# Patient Record
Sex: Female | Born: 1950
Health system: Southern US, Community
[De-identification: ages and names within clinical notes are randomized; demographics above are authoritative.]

## PROBLEM LIST (undated history)

## (undated) DIAGNOSIS — Z8489 Family history of other specified conditions: Secondary | ICD-10-CM

## (undated) DIAGNOSIS — G5600 Carpal tunnel syndrome, unspecified upper limb: Secondary | ICD-10-CM

## (undated) DIAGNOSIS — E785 Hyperlipidemia, unspecified: Secondary | ICD-10-CM

## (undated) DIAGNOSIS — I10 Essential (primary) hypertension: Secondary | ICD-10-CM

## (undated) DIAGNOSIS — M8580 Other specified disorders of bone density and structure, unspecified site: Secondary | ICD-10-CM

## (undated) DIAGNOSIS — M75101 Unspecified rotator cuff tear or rupture of right shoulder, not specified as traumatic: Secondary | ICD-10-CM

## (undated) HISTORY — DX: Hypercalcemia: E83.52

## (undated) HISTORY — DX: Carpal tunnel syndrome, unspecified upper limb: G56.00

## (undated) HISTORY — DX: Unspecified rotator cuff tear or rupture of right shoulder, not specified as traumatic: M75.101

## (undated) HISTORY — DX: Hyperlipidemia, unspecified: E78.5

## (undated) HISTORY — DX: Other specified disorders of bone density and structure, unspecified site: M85.80

## (undated) HISTORY — PX: ABDOMINAL HYSTERECTOMY: SHX81

## (undated) HISTORY — DX: Essential (primary) hypertension: I10

## (undated) HISTORY — PX: BREAST CYST EXCISION: SHX579

---
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
## 1994-11-22 DIAGNOSIS — E1165 Type 2 diabetes mellitus with hyperglycemia: Secondary | ICD-10-CM

## 1994-11-22 DIAGNOSIS — E119 Type 2 diabetes mellitus without complications: Secondary | ICD-10-CM | POA: Insufficient documentation

## 1994-11-22 HISTORY — DX: Type 2 diabetes mellitus without complications: E11.9

## 1998-06-08 ENCOUNTER — Ambulatory Visit: Admission: RE | Admit: 1998-06-08 | Discharge: 1998-06-08 | Payer: Self-pay | Admitting: *Deleted

## 1999-09-20 ENCOUNTER — Ambulatory Visit (HOSPITAL_COMMUNITY): Admission: RE | Admit: 1999-09-20 | Discharge: 1999-09-20 | Payer: Self-pay | Admitting: Neurosurgery

## 1999-09-20 ENCOUNTER — Encounter: Payer: Self-pay | Admitting: Neurosurgery

## 1999-09-20 DIAGNOSIS — M8580 Other specified disorders of bone density and structure, unspecified site: Secondary | ICD-10-CM

## 1999-09-20 HISTORY — DX: Other specified disorders of bone density and structure, unspecified site: M85.80

## 1999-10-03 ENCOUNTER — Encounter: Payer: Self-pay | Admitting: Neurosurgery

## 1999-10-03 ENCOUNTER — Ambulatory Visit (HOSPITAL_COMMUNITY): Admission: RE | Admit: 1999-10-03 | Discharge: 1999-10-03 | Payer: Self-pay | Admitting: Neurosurgery

## 1999-10-14 ENCOUNTER — Encounter: Payer: Self-pay | Admitting: Neurosurgery

## 1999-10-28 ENCOUNTER — Encounter: Payer: Self-pay | Admitting: Neurosurgery

## 1999-10-28 ENCOUNTER — Ambulatory Visit (HOSPITAL_COMMUNITY): Admission: RE | Admit: 1999-10-28 | Discharge: 1999-10-28 | Payer: Self-pay | Admitting: Neurosurgery

## 2000-03-02 ENCOUNTER — Encounter: Admission: RE | Admit: 2000-03-02 | Discharge: 2000-03-02 | Payer: Self-pay | Admitting: Hematology and Oncology

## 2001-10-01 ENCOUNTER — Ambulatory Visit (HOSPITAL_COMMUNITY): Admission: RE | Admit: 2001-10-01 | Discharge: 2001-10-01 | Payer: Self-pay | Admitting: Obstetrics

## 2001-12-02 ENCOUNTER — Ambulatory Visit (HOSPITAL_COMMUNITY): Admission: RE | Admit: 2001-12-02 | Discharge: 2001-12-02 | Payer: Self-pay | Admitting: Urology

## 2001-12-02 ENCOUNTER — Encounter: Payer: Self-pay | Admitting: Urology

## 2001-12-05 ENCOUNTER — Encounter: Payer: Self-pay | Admitting: Urology

## 2001-12-05 ENCOUNTER — Ambulatory Visit (HOSPITAL_COMMUNITY): Admission: RE | Admit: 2001-12-05 | Discharge: 2001-12-05 | Payer: Self-pay | Admitting: Urology

## 2002-12-16 ENCOUNTER — Ambulatory Visit (HOSPITAL_COMMUNITY): Admission: RE | Admit: 2002-12-16 | Discharge: 2002-12-16 | Payer: Self-pay | Admitting: Internal Medicine

## 2002-12-16 ENCOUNTER — Encounter: Payer: Self-pay | Admitting: Internal Medicine

## 2003-02-15 ENCOUNTER — Emergency Department (HOSPITAL_COMMUNITY): Admission: EM | Admit: 2003-02-15 | Discharge: 2003-02-15 | Payer: Self-pay | Admitting: *Deleted

## 2003-07-29 ENCOUNTER — Ambulatory Visit (HOSPITAL_COMMUNITY): Admission: RE | Admit: 2003-07-29 | Discharge: 2003-07-29 | Payer: Self-pay | Admitting: General Surgery

## 2003-07-29 ENCOUNTER — Encounter (HOSPITAL_BASED_OUTPATIENT_CLINIC_OR_DEPARTMENT_OTHER): Payer: Self-pay | Admitting: General Surgery

## 2003-08-05 ENCOUNTER — Ambulatory Visit (HOSPITAL_COMMUNITY): Admission: RE | Admit: 2003-08-05 | Discharge: 2003-08-05 | Payer: Self-pay | Admitting: General Surgery

## 2003-08-05 ENCOUNTER — Encounter (HOSPITAL_BASED_OUTPATIENT_CLINIC_OR_DEPARTMENT_OTHER): Payer: Self-pay | Admitting: General Surgery

## 2004-04-05 ENCOUNTER — Ambulatory Visit (HOSPITAL_COMMUNITY): Admission: RE | Admit: 2004-04-05 | Discharge: 2004-04-05 | Payer: Self-pay | Admitting: Internal Medicine

## 2005-05-01 ENCOUNTER — Ambulatory Visit (HOSPITAL_COMMUNITY): Admission: RE | Admit: 2005-05-01 | Discharge: 2005-05-01 | Payer: Self-pay | Admitting: Internal Medicine

## 2006-04-18 ENCOUNTER — Ambulatory Visit: Payer: Self-pay | Admitting: Internal Medicine

## 2006-05-01 ENCOUNTER — Ambulatory Visit: Payer: Self-pay | Admitting: Internal Medicine

## 2006-07-19 ENCOUNTER — Ambulatory Visit: Payer: Self-pay | Admitting: Internal Medicine

## 2006-11-20 ENCOUNTER — Ambulatory Visit (HOSPITAL_COMMUNITY): Admission: RE | Admit: 2006-11-20 | Discharge: 2006-11-20 | Payer: Self-pay | Admitting: Vascular Surgery

## 2006-11-22 DIAGNOSIS — I10 Essential (primary) hypertension: Secondary | ICD-10-CM | POA: Insufficient documentation

## 2006-11-22 DIAGNOSIS — E785 Hyperlipidemia, unspecified: Secondary | ICD-10-CM | POA: Insufficient documentation

## 2006-11-28 ENCOUNTER — Emergency Department (HOSPITAL_COMMUNITY): Admission: EM | Admit: 2006-11-28 | Discharge: 2006-11-28 | Payer: Self-pay | Admitting: Family Medicine

## 2007-01-14 ENCOUNTER — Encounter (INDEPENDENT_AMBULATORY_CARE_PROVIDER_SITE_OTHER): Payer: Self-pay | Admitting: Unknown Physician Specialty

## 2007-01-14 ENCOUNTER — Ambulatory Visit: Payer: Self-pay | Admitting: Internal Medicine

## 2007-01-14 LAB — CONVERTED CEMR LAB: Microalb, Ur: 0.2 mg/dL (ref 0.00–1.89)

## 2007-01-15 ENCOUNTER — Encounter (INDEPENDENT_AMBULATORY_CARE_PROVIDER_SITE_OTHER): Payer: Self-pay | Admitting: Unknown Physician Specialty

## 2007-01-15 ENCOUNTER — Encounter (INDEPENDENT_AMBULATORY_CARE_PROVIDER_SITE_OTHER): Payer: Self-pay | Admitting: *Deleted

## 2007-01-15 ENCOUNTER — Ambulatory Visit: Payer: Self-pay | Admitting: Internal Medicine

## 2007-01-15 LAB — CONVERTED CEMR LAB
LDL Cholesterol: 110 mg/dL — ABNORMAL HIGH (ref 0–99)
Total CHOL/HDL Ratio: 3.6
VLDL: 27 mg/dL (ref 0–40)

## 2007-02-06 ENCOUNTER — Ambulatory Visit: Payer: Self-pay | Admitting: Internal Medicine

## 2007-02-06 LAB — CONVERTED CEMR LAB: Blood Glucose, Home Monitor: 1 mg/dL

## 2007-05-07 ENCOUNTER — Encounter (INDEPENDENT_AMBULATORY_CARE_PROVIDER_SITE_OTHER): Payer: Self-pay | Admitting: Unknown Physician Specialty

## 2007-05-07 ENCOUNTER — Ambulatory Visit: Payer: Self-pay | Admitting: Internal Medicine

## 2007-05-07 LAB — CONVERTED CEMR LAB
CO2: 26 meq/L (ref 19–32)
Calcium: 10.4 mg/dL (ref 8.4–10.5)
Hgb A1c MFr Bld: 6.9 %
Platelets: 316 10*3/uL (ref 150–400)
Potassium: 4.2 meq/L (ref 3.5–5.3)
RBC: 3.66 M/uL — ABNORMAL LOW (ref 3.87–5.11)
RDW: 15.6 % — ABNORMAL HIGH (ref 11.5–14.0)
Sodium: 140 meq/L (ref 135–145)
TSH: 1.107 microintl units/mL (ref 0.350–5.50)
Total Protein: 8.3 g/dL (ref 6.0–8.3)

## 2007-06-10 ENCOUNTER — Telehealth (INDEPENDENT_AMBULATORY_CARE_PROVIDER_SITE_OTHER): Payer: Self-pay | Admitting: *Deleted

## 2007-07-29 ENCOUNTER — Telehealth (INDEPENDENT_AMBULATORY_CARE_PROVIDER_SITE_OTHER): Payer: Self-pay | Admitting: *Deleted

## 2007-08-13 ENCOUNTER — Ambulatory Visit: Payer: Self-pay | Admitting: Hospitalist

## 2007-08-14 ENCOUNTER — Encounter (INDEPENDENT_AMBULATORY_CARE_PROVIDER_SITE_OTHER): Payer: Self-pay | Admitting: *Deleted

## 2007-08-14 ENCOUNTER — Telehealth: Payer: Self-pay | Admitting: *Deleted

## 2007-08-14 ENCOUNTER — Ambulatory Visit: Payer: Self-pay | Admitting: Internal Medicine

## 2007-08-14 DIAGNOSIS — N390 Urinary tract infection, site not specified: Secondary | ICD-10-CM | POA: Insufficient documentation

## 2007-08-16 ENCOUNTER — Encounter (INDEPENDENT_AMBULATORY_CARE_PROVIDER_SITE_OTHER): Payer: Self-pay | Admitting: Internal Medicine

## 2007-08-16 LAB — CONVERTED CEMR LAB
Specific Gravity, Urine: 1.018 (ref 1.005–1.03)
Urine Glucose: NEGATIVE mg/dL
Urobilinogen, UA: 0.2 (ref 0.0–1.0)
pH: 6 (ref 5.0–8.0)

## 2007-11-26 ENCOUNTER — Ambulatory Visit (HOSPITAL_COMMUNITY): Admission: RE | Admit: 2007-11-26 | Discharge: 2007-11-26 | Payer: Self-pay | Admitting: Infectious Diseases

## 2007-12-03 ENCOUNTER — Telehealth (INDEPENDENT_AMBULATORY_CARE_PROVIDER_SITE_OTHER): Payer: Self-pay | Admitting: Pharmacy Technician

## 2008-02-11 ENCOUNTER — Encounter (INDEPENDENT_AMBULATORY_CARE_PROVIDER_SITE_OTHER): Payer: Self-pay | Admitting: *Deleted

## 2008-02-11 ENCOUNTER — Ambulatory Visit: Payer: Self-pay | Admitting: *Deleted

## 2008-02-11 LAB — CONVERTED CEMR LAB: Hgb A1c MFr Bld: 8 %

## 2008-02-12 ENCOUNTER — Ambulatory Visit: Payer: Self-pay | Admitting: Infectious Diseases

## 2008-02-12 ENCOUNTER — Encounter (INDEPENDENT_AMBULATORY_CARE_PROVIDER_SITE_OTHER): Payer: Self-pay | Admitting: *Deleted

## 2008-02-12 LAB — CONVERTED CEMR LAB
ALT: 19 units/L (ref 0–35)
AST: 18 units/L (ref 0–37)
Alkaline Phosphatase: 61 units/L (ref 39–117)
BUN: 12 mg/dL (ref 6–23)
Calcium: 10.5 mg/dL (ref 8.4–10.5)
Microalb Creat Ratio: 3 mg/g (ref 0.0–30.0)
Microalb, Ur: 0.2 mg/dL (ref 0.00–1.89)
Sodium: 141 meq/L (ref 135–145)
Triglycerides: 178 mg/dL — ABNORMAL HIGH (ref <150)

## 2008-02-17 ENCOUNTER — Ambulatory Visit: Payer: Self-pay | Admitting: Internal Medicine

## 2008-03-04 ENCOUNTER — Ambulatory Visit: Payer: Self-pay | Admitting: Hospitalist

## 2008-03-10 ENCOUNTER — Ambulatory Visit (HOSPITAL_COMMUNITY): Admission: RE | Admit: 2008-03-10 | Discharge: 2008-03-10 | Payer: Self-pay | Admitting: Internal Medicine

## 2008-03-12 ENCOUNTER — Ambulatory Visit: Payer: Self-pay | Admitting: Internal Medicine

## 2008-05-21 ENCOUNTER — Ambulatory Visit: Payer: Self-pay | Admitting: Internal Medicine

## 2008-05-21 LAB — CONVERTED CEMR LAB
Blood Glucose, Fingerstick: 242
Hgb A1c MFr Bld: 7 %

## 2008-05-27 ENCOUNTER — Ambulatory Visit: Payer: Self-pay | Admitting: Internal Medicine

## 2008-07-10 ENCOUNTER — Emergency Department (HOSPITAL_COMMUNITY): Admission: EM | Admit: 2008-07-10 | Discharge: 2008-07-10 | Payer: Self-pay | Admitting: Emergency Medicine

## 2008-11-01 ENCOUNTER — Emergency Department (HOSPITAL_COMMUNITY): Admission: EM | Admit: 2008-11-01 | Discharge: 2008-11-01 | Payer: Self-pay | Admitting: Emergency Medicine

## 2008-11-02 ENCOUNTER — Encounter (INDEPENDENT_AMBULATORY_CARE_PROVIDER_SITE_OTHER): Payer: Self-pay | Admitting: Internal Medicine

## 2008-11-02 ENCOUNTER — Ambulatory Visit: Payer: Self-pay | Admitting: *Deleted

## 2008-11-02 DIAGNOSIS — M719 Bursopathy, unspecified: Secondary | ICD-10-CM

## 2008-11-02 DIAGNOSIS — E210 Primary hyperparathyroidism: Secondary | ICD-10-CM | POA: Insufficient documentation

## 2008-11-02 DIAGNOSIS — M67919 Unspecified disorder of synovium and tendon, unspecified shoulder: Secondary | ICD-10-CM | POA: Insufficient documentation

## 2008-11-02 LAB — CONVERTED CEMR LAB: Hgb A1c MFr Bld: 7.5 %

## 2008-11-03 LAB — CONVERTED CEMR LAB
ALT: 20 units/L (ref 0–35)
AST: 19 units/L (ref 0–37)
Albumin: 4.6 g/dL (ref 3.5–5.2)
Alkaline Phosphatase: 69 units/L (ref 39–117)
CO2: 23 meq/L (ref 19–32)
Calcium: 11 mg/dL — ABNORMAL HIGH (ref 8.4–10.5)
Creatinine, Ser: 0.75 mg/dL (ref 0.40–1.20)
Creatinine, Urine: 125.7 mg/dL
Hemoglobin: 12.4 g/dL (ref 12.0–15.0)
MCHC: 33.2 g/dL (ref 30.0–36.0)
MCV: 94.9 fL (ref 78.0–100.0)
Potassium: 4.7 meq/L (ref 3.5–5.3)
RBC: 3.93 M/uL (ref 3.87–5.11)
RDW: 14.1 % (ref 11.5–15.5)
Total Bilirubin: 0.4 mg/dL (ref 0.3–1.2)
Total Protein: 8 g/dL (ref 6.0–8.3)

## 2008-11-04 ENCOUNTER — Ambulatory Visit (HOSPITAL_COMMUNITY): Admission: RE | Admit: 2008-11-04 | Discharge: 2008-11-04 | Payer: Self-pay | Admitting: *Deleted

## 2008-11-25 ENCOUNTER — Telehealth: Payer: Self-pay | Admitting: Internal Medicine

## 2008-12-14 ENCOUNTER — Ambulatory Visit: Payer: Self-pay | Admitting: Family Medicine

## 2008-12-15 ENCOUNTER — Encounter: Payer: Self-pay | Admitting: Family Medicine

## 2008-12-29 ENCOUNTER — Ambulatory Visit (HOSPITAL_COMMUNITY): Admission: RE | Admit: 2008-12-29 | Discharge: 2008-12-29 | Payer: Self-pay | Admitting: Internal Medicine

## 2008-12-30 ENCOUNTER — Encounter (INDEPENDENT_AMBULATORY_CARE_PROVIDER_SITE_OTHER): Payer: Self-pay | Admitting: Internal Medicine

## 2009-02-01 ENCOUNTER — Ambulatory Visit: Payer: Self-pay | Admitting: Family Medicine

## 2009-02-02 ENCOUNTER — Encounter: Payer: Self-pay | Admitting: Family Medicine

## 2009-02-26 ENCOUNTER — Encounter: Payer: Self-pay | Admitting: Internal Medicine

## 2009-03-22 ENCOUNTER — Telehealth: Payer: Self-pay | Admitting: Internal Medicine

## 2009-04-28 ENCOUNTER — Encounter: Payer: Self-pay | Admitting: Sports Medicine

## 2009-07-26 ENCOUNTER — Ambulatory Visit: Payer: Self-pay | Admitting: Family Medicine

## 2009-08-12 ENCOUNTER — Encounter: Payer: Self-pay | Admitting: Sports Medicine

## 2009-09-20 ENCOUNTER — Telehealth: Payer: Self-pay | Admitting: *Deleted

## 2009-12-30 ENCOUNTER — Ambulatory Visit (HOSPITAL_COMMUNITY): Admission: RE | Admit: 2009-12-30 | Discharge: 2009-12-30 | Payer: Self-pay | Admitting: Internal Medicine

## 2010-01-04 ENCOUNTER — Ambulatory Visit: Payer: Self-pay | Admitting: Internal Medicine

## 2010-01-05 ENCOUNTER — Ambulatory Visit: Payer: Self-pay | Admitting: Internal Medicine

## 2010-01-08 LAB — CONVERTED CEMR LAB
Alkaline Phosphatase: 63 units/L (ref 39–117)
BUN: 14 mg/dL (ref 6–23)
CO2: 26 meq/L (ref 19–32)
Chloride: 101 meq/L (ref 96–112)
Creatinine, Ser: 0.75 mg/dL (ref 0.40–1.20)
Creatinine, Urine: 110.9 mg/dL
Glucose, Bld: 270 mg/dL — ABNORMAL HIGH (ref 70–99)
HDL: 53 mg/dL (ref >39)
Microalb Creat Ratio: 4.5 mg/g (ref 0.0–30.0)
Total Bilirubin: 0.5 mg/dL (ref 0.3–1.2)
Total CHOL/HDL Ratio: 3.4
Total Protein: 7.9 g/dL (ref 6.0–8.3)
Triglycerides: 143 mg/dL (ref <150)
VLDL: 29 mg/dL (ref 0–40)

## 2010-01-10 ENCOUNTER — Ambulatory Visit: Payer: Self-pay | Admitting: Internal Medicine

## 2010-01-11 LAB — CONVERTED CEMR LAB
Calcium, Total (PTH): 11.8 mg/dL — ABNORMAL HIGH (ref 8.4–10.5)
PTH: 57.4 pg/mL (ref 14.0–72.0)

## 2010-01-13 ENCOUNTER — Encounter: Payer: Self-pay | Admitting: Internal Medicine

## 2010-01-13 ENCOUNTER — Telehealth: Payer: Self-pay | Admitting: Internal Medicine

## 2010-01-24 ENCOUNTER — Ambulatory Visit: Payer: Self-pay | Admitting: Internal Medicine

## 2010-01-24 DIAGNOSIS — J069 Acute upper respiratory infection, unspecified: Secondary | ICD-10-CM | POA: Insufficient documentation

## 2010-03-10 ENCOUNTER — Telehealth: Payer: Self-pay | Admitting: Internal Medicine

## 2010-03-30 ENCOUNTER — Ambulatory Visit: Payer: Self-pay | Admitting: Internal Medicine

## 2010-03-30 DIAGNOSIS — G5600 Carpal tunnel syndrome, unspecified upper limb: Secondary | ICD-10-CM | POA: Insufficient documentation

## 2010-03-30 HISTORY — DX: Carpal tunnel syndrome, unspecified upper limb: G56.00

## 2010-03-30 LAB — HM DIABETES FOOT EXAM

## 2010-03-30 LAB — CONVERTED CEMR LAB: Hgb A1c MFr Bld: 9.6 %

## 2010-04-04 ENCOUNTER — Telehealth: Payer: Self-pay | Admitting: *Deleted

## 2010-04-06 ENCOUNTER — Ambulatory Visit (HOSPITAL_COMMUNITY): Admission: RE | Admit: 2010-04-06 | Discharge: 2010-04-06 | Payer: Self-pay | Admitting: Neurosurgery

## 2010-04-06 ENCOUNTER — Ambulatory Visit (HOSPITAL_COMMUNITY): Admission: RE | Admit: 2010-04-06 | Discharge: 2010-04-06 | Payer: Self-pay | Admitting: Internal Medicine

## 2010-05-25 ENCOUNTER — Encounter: Payer: Self-pay | Admitting: Internal Medicine

## 2010-06-07 ENCOUNTER — Telehealth: Payer: Self-pay | Admitting: Internal Medicine

## 2010-08-04 ENCOUNTER — Ambulatory Visit: Payer: Self-pay | Admitting: Internal Medicine

## 2010-08-23 ENCOUNTER — Telehealth: Payer: Self-pay | Admitting: Internal Medicine

## 2010-08-25 ENCOUNTER — Telehealth: Payer: Self-pay | Admitting: Internal Medicine

## 2010-08-29 ENCOUNTER — Encounter: Payer: Self-pay | Admitting: Internal Medicine

## 2010-08-29 LAB — HM DIABETES EYE EXAM

## 2010-08-30 ENCOUNTER — Telehealth: Payer: Self-pay | Admitting: Family Medicine

## 2010-08-31 ENCOUNTER — Ambulatory Visit: Payer: Self-pay | Admitting: Internal Medicine

## 2010-08-31 LAB — CONVERTED CEMR LAB: Blood Glucose, Fingerstick: 321

## 2010-09-28 ENCOUNTER — Ambulatory Visit: Payer: Self-pay | Admitting: Internal Medicine

## 2010-10-31 ENCOUNTER — Telehealth (INDEPENDENT_AMBULATORY_CARE_PROVIDER_SITE_OTHER): Payer: Self-pay | Admitting: *Deleted

## 2010-10-31 ENCOUNTER — Ambulatory Visit: Payer: Self-pay | Admitting: Internal Medicine

## 2010-10-31 LAB — CONVERTED CEMR LAB
Blood Glucose, AC Bkfst: 189 mg/dL
TSH: 1.292 microintl units/mL (ref 0.350–4.5)
Vit D, 25-Hydroxy: 32 ng/mL (ref 30–89)

## 2010-11-07 ENCOUNTER — Ambulatory Visit: Payer: Self-pay | Admitting: Internal Medicine

## 2010-11-07 ENCOUNTER — Encounter: Payer: Self-pay | Admitting: Internal Medicine

## 2010-11-30 ENCOUNTER — Telehealth: Payer: Self-pay | Admitting: *Deleted

## 2010-12-08 ENCOUNTER — Telehealth: Payer: Self-pay | Admitting: Internal Medicine

## 2010-12-23 ENCOUNTER — Encounter: Payer: Self-pay | Admitting: Internal Medicine

## 2011-01-01 ENCOUNTER — Encounter: Payer: Self-pay | Admitting: Internal Medicine

## 2011-01-03 ENCOUNTER — Ambulatory Visit (HOSPITAL_COMMUNITY)
Admission: RE | Admit: 2011-01-03 | Discharge: 2011-01-03 | Payer: Self-pay | Source: Home / Self Care | Attending: Internal Medicine | Admitting: Internal Medicine

## 2011-01-10 NOTE — Assessment & Plan Note (Signed)
Summary: ABNORMAL LABS/DS   Vital Signs:  Patient profile:   60 year old female Height:      63 inches (160.02 cm) Weight:      172.9 pounds (78.59 kg) BMI:     30.74 Temp:     98.3 degrees F (36.83 degrees C) oral Pulse rate:   100 / minute BP sitting:   126 / 82  (left arm)  Vitals Entered By: Cynda Familia Duncan Dull) (October 31, 2010 8:20 AM) CC: f/u elevated calcium of 10.8  Is Patient Diabetic? Yes Did you bring your meter with you today? No Pain Assessment Patient in pain? no      Nutritional Status BMI of > 30 = obese  Does patient need assistance? Functional Status Self care Ambulation Normal   Primary Care Cyndal Kasson:  Jackson Latino MD  CC:  f/u elevated calcium of 10.8 .  History of Present Illness: Pt is a 60 yo AAF with PMH of DM, HTN and HLD who came to the Clinic for regular f/u and check for hypercalcemia. She has no c/o, including CP, SOB, fever. She has been taking all her meds as instructed. No melena, appetite change or dysuria. Denies smoking or ETOH. She has been trying to exercise and weight loss. She has recent employee test shows Ca 10.8 and in the past her Ca 10.4- 11.8. She has been taking multivitamin for years.   Preventive Screening-Counseling & Management  Alcohol-Tobacco     Smoking Status: never  Problems Prior to Update: 1)  Carpal Tunnel Syndrome, Left  (ICD-354.0) 2)  Health Screening  (ICD-V70.0) 3)  Uri  (ICD-465.9) 4)  Hypercalcemia  (ICD-275.42) 5)  Rotator Cuff Syndrome, Right  (ICD-726.10) 6)  Infection, Urinary Tract Nos  (ICD-599.0) 7)  Hypertension  (ICD-401.9) 8)  Hyperlipidemia  (ICD-272.4) 9)  Diabetes Mellitus, Type II  (ICD-250.00)  Medications Prior to Update: 1)  Metformin Hcl 1000 Mg Tabs (Metformin Hcl) .... Take 1 Tablet By Mouth Two Times A Day 2)  Glipizide 10 Mg Tabs (Glipizide) .... Take 1 Tablet By Mouth Two Times A Day 3)  Pravastatin Sodium 40 Mg Tabs (Pravastatin Sodium) .... Take 1 Tablet By Mouth  Once A Day 4)  Accu-Chek Softclix Lancets   Misc (Lancets) .... To Check Blood Sugar 2x/day- 5)  Lisinopril 10 Mg  Tabs (Lisinopril) .... Take 1 Tablet By Mouth Once A Day 6)  Flexeril 5 Mg Tabs (Cyclobenzaprine Hcl) .... Take One Tab By Mouth Once Every 8 Hours As Needed For Pain 7)  Ibu 800 Mg Tabs (Ibuprofen) .... Take One Tab By Mouth With Food Once Every 8 Hours For One Week, Then Once Every 8 Hours With Food As Needed For Pain 8)  Furosemide 20 Mg Tabs (Furosemide) .... Take 1 Tablet By Mouth Two Times A Day 9)  Truetrack Blood Glucose  Devi (Blood Glucose Monitoring Suppl) 10)  Truetrack Test  Strp (Glucose Blood) .... Give 3 Months Supply Each Time and Soft Click Lancets To Test Two Times A Day  Current Medications (verified): 1)  Metformin Hcl 1000 Mg Tabs (Metformin Hcl) .... Take 1 Tablet By Mouth Two Times A Day 2)  Glipizide 10 Mg Tabs (Glipizide) .... Take 1 Tablet By Mouth Two Times A Day 3)  Pravastatin Sodium 40 Mg Tabs (Pravastatin Sodium) .... Take 1 Tablet By Mouth Once A Day 4)  Accu-Chek Softclix Lancets   Misc (Lancets) .... To Check Blood Sugar 2x/day- 5)  Lisinopril 10 Mg  Tabs (Lisinopril) .Marland KitchenMarland KitchenMarland Kitchen  Take 1 Tablet By Mouth Once A Day 6)  Flexeril 5 Mg Tabs (Cyclobenzaprine Hcl) .... Take One Tab By Mouth Once Every 8 Hours As Needed For Pain 7)  Ibu 800 Mg Tabs (Ibuprofen) .... Take One Tab By Mouth With Food Once Every 8 Hours For One Week, Then Once Every 8 Hours With Food As Needed For Pain 8)  Furosemide 20 Mg Tabs (Furosemide) .... Take 1 Tablet By Mouth Two Times A Day 9)  Truetrack Blood Glucose  Devi (Blood Glucose Monitoring Suppl) 10)  Truetrack Test  Strp (Glucose Blood) .... 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 Mother - DM, HTN Siblings - HTN  Breast Ca - 2 Aunt, Cousin  Social History: Reviewed history from 05/21/2008 and no changes required. 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    Physical Exam  General:  alert, well-developed, well-nourished, well-hydrated, and overweight-appearing.  alert, well-developed, well-nourished, and well-hydrated.   Head:  normocephalic.   Nose:  no nasal discharge.   Mouth:  pharynx pink and moist.   Neck:  supple and no masses.   Lungs:  normal respiratory effort, normal breath sounds, no crackles, and no wheezes.   Heart:  normal rate, regular rhythm, no murmur, and no JVD.   Abdomen:  soft, non-tender, normal bowel sounds, no distention, and no masses.   Msk:  normal ROM, no joint tenderness, no joint swelling, and no joint warmth.   Pulses:  2+ Extremities:  No edema. Neurologic:  alert & oriented X3 and gait normal.     Impression & Recommendations:  Problem # 1:  HYPERCALCEMIA (ICD-275.42) Assessment Unchanged Her recent Ca 10.8, She has hypercalcemia before and highest 11.8. The cause is unclear. Early this year PTH is WNL and she has no weight loss or other signs for malignancy such as normal mammogram and colonoscopy. Thyroid disorders, elevated vit D or familial hypocaluric hypercalcemia may also be  possible. So will check TSH, PTH rp, 24 hr urine calcium, and Vit D-25 level. Advised her to stop multivitamin. Will f/u these labs. Orders: T-TSH [id number]) T-Vitamin D (25-Hydroxy) [id number]) T- * Misc. Laboratory test [id number]) T-Calcium,Urine Timed specimen  ([id number])Future Orders: T-CMP with Estimated GFR ([id number]) ... 02/01/2011  Problem # 2:  DIABETES MELLITUS, TYPE II (ICD-250.00) Assessment: Deteriorated Her CBG usually runs 160s. today A1C 9.2 and will increase glipizide and advised weight loss and exercise. Recheck at next visit.  Her updated medication list for this problem includes:    Metformin Hcl 1000 Mg Tabs (Metformin hcl) .Marland Kitchen... Take 1 tablet by mouth two times a day    Glipizide 10 Mg Tabs (Glipizide) .Marland Kitchen... Take 1 and 1/2 tablets by mouth two times a day    Lisinopril  10 Mg Tabs (Lisinopril) .Marland Kitchen... Take 1 tablet by mouth once a day  Orders: T- Capillary Blood Glucose ([id number]) T-Hgb A1C (in-house) ([id number])  Labs Reviewed: Creat: 0.75 (01/05/2010)     Last Eye Exam: No diabetic retinopathy.    (08/29/2010) Reviewed HgBA1c results: 9.2 (10/31/2010)  8.3 (08/04/2010)  Problem # 3:  HYPERTENSION (ICD-401.9) Assessment: Improved Bp at goal and will continue current meds.  Her updated medication list for this problem includes:    Lisinopril 10 Mg Tabs (Lisinopril) .Marland Kitchen... Take 1 tablet by mouth once a day    Furosemide 20 Mg Tabs (Furosemide) .Marland Kitchen... Take 1 tablet by mouth two times a day  BP today: 126/82 Prior BP: 132/74 (09/28/2010)  Prior 10 Yr Risk Heart Disease: 15 % (05/07/2007)  Labs Reviewed: K+: 4.6 (01/05/2010) Creat: : 0.75 (01/05/2010)   Chol: 181 (01/05/2010)   HDL: 53 (01/05/2010)   LDL: 99 (01/05/2010)   TG: 143 (01/05/2010)  Problem # 4:  HYPERLIPIDEMIA (ICD-272.4) Assessment: Unchanged LDL at goal and continue statin.  Her updated medication list for this problem includes:    Pravastatin Sodium 40 Mg Tabs (Pravastatin sodium) .Marland Kitchen...  Take 1 tablet by mouth once a day  Labs Reviewed: SGOT: 18 (01/05/2010)   SGPT: 21 (01/05/2010)  Prior 10 Yr Risk Heart Disease: 15 % (05/07/2007)   HDL:53 (01/05/2010), 62 (11/02/2008)  LDL:99 (01/05/2010), 82 (11/02/2008)  Chol:181 (01/05/2010), 176 (11/02/2008)  Trig:143 (01/05/2010), 159 (11/02/2008)  Complete Medication List: 1)  Metformin Hcl 1000 Mg Tabs (Metformin hcl) .... Take 1 tablet by mouth two times a day 2)  Glipizide 10 Mg Tabs (Glipizide) .... Take 1 and 1/2 tablets by mouth two times a day 3)  Pravastatin Sodium 40 Mg Tabs (Pravastatin sodium) .... Take 1 tablet by mouth once a day 4)  Accu-chek Softclix Lancets Misc (Lancets) .... To check blood sugar 2x/day- 5)  Lisinopril 10 Mg Tabs (Lisinopril) .... Take 1 tablet by mouth once a day 6)  Flexeril 5 Mg Tabs (Cyclobenzaprine hcl) .... Take one tab by mouth once every 8 hours as needed for pain 7)  Ibu 800 Mg Tabs (Ibuprofen) .... Take one tab by mouth with food once every 8 hours for one week, then once every 8 hours with food as needed for pain 8)  Furosemide 20 Mg Tabs (Furosemide) .... Take 1 tablet by mouth two times a day 9)  Truetrack Blood Glucose Devi (Blood glucose monitoring suppl) 10)  Truetrack Test Strp (Glucose blood) .... Give 3 months supply each time and soft click lancets to test two times a day  Patient Instructions: 1)  Please schedule a follow-up appointment in 6 months. 2)  Will call you if any abnormal labs. 3)  Please don't take your multivitamin for now and recheck your BMET in 3 months.  4)  It is important that you exercise regularly at least 20 minutes 5 times a week. If you develop chest pain, have severe difficulty breathing, or feel very tired , stop exercising immediately and seek medical attention. 5)  You need to lose weight. Consider a lower calorie diet and regular exercise.  Prescriptions: GLIPIZIDE 10 MG TABS (GLIPIZIDE) Take 1 and 1/2 tablets by mouth two times a day  #60 x 6    Entered and Authorized by:   Jackson Latino MD   Signed by:   Jackson Latino MD on 10/31/2010   Method used:   Electronically to        [facility]* (retail)  [address]       [address] Shipping/mailing       [place], [place]  [address]       Ph: [phone]       Fax: [phone]   RxID:   [id number]    Orders Added: 1)  T- Capillary Blood Glucose [82948] 2)  T-Hgb A1C (in-house) [[id number]] 3)  T-TSH [[id number]] 4)  T-CMP with Estimated GFR [[id number]] 5)  Est. Patient Level IV [93235] 6)  T-Vitamin D (25-Hydroxy) [[id number]] 7)  T- * Misc. Laboratory test [99999] 8)  T-Calcium,Urine Timed specimen  [[id number]]   Immunization History:  Influenza Immunization History:    Influenza:  historical (08/24/2010)   Immunization History:  Influenza Immunization History:    Influenza:  Historical (08/24/2010) Process Orders Check Orders Results:     [facility]: ABN not required for this insurance Tests Sent for requisitioning (October 31, 2010 9:30 PM):     10/31/2010: [facility] -- T-TSH [phone] (signed)     02/01/2011: [facility] -- T-CMP with Estimated GFR [[id number]] (signed)     10/31/2010: [facility] -- T-Vitamin D (25-Hydroxy) [phone] (signed)     10/31/2010: [facility] -- T- * Misc. Laboratory test [id number] (signed)     10/31/2010: [facility] -- T-Calcium,Urine Timed specimen  [phone] (signed)     Prevention & Chronic Care Immunizations   Influenza vaccine: Historical  (08/24/2010)   Influenza vaccine deferral: Not indicated  (01/04/2010)    Tetanus booster: Not documented   Td booster deferral: Not indicated  (03/30/2010)    Pneumococcal vaccine: Not documented    H. zoster vaccine: Not documented  Colorectal Screening   Hemoccult: Not documented   Hemoccult action/deferral: Deferred   (10/31/2010)    Colonoscopy: Not documented   Colonoscopy action/deferral: Not indicated  (03/30/2010)  Other Screening   Pap smear: Not documented   Pap smear action/deferral: Not indicated S/P hysterectomy  (01/04/2010)    Mammogram: ASSESSMENT: Negative - BI-RADS 1^MM DIGITAL SCREENING  (12/30/2009)   Mammogram due: 12/30/2010    DXA bone density scan: Not documented  Reports requested:   Last colonoscopy report requested.  Smoking status: never  (10/31/2010)  Diabetes Mellitus   HgbA1C: 9.2  (10/31/2010)   HgbA1C action/deferral: Ordered  (01/04/2010)    Eye exam: No diabetic retinopathy.     (08/29/2010)   Diabetic eye exam action/deferral: Ophthalmology referral  (08/04/2010)   Eye exam due: 09/2011    Foot exam: yes  (03/30/2010)   Foot exam action/deferral: Do today   High risk foot: No  (03/30/2010)   Foot care education: Done  (03/30/2010)    Urine microalbumin/creatinine ratio: 4.5  (01/05/2010)   Urine microalbumin action/deferral: Ordered    Diabetes flowsheet reviewed?: Yes   Progress toward A1C goal: Improved  Lipids   Total Cholesterol: 181  (01/05/2010)   Lipid panel action/deferral: Lipid Panel ordered   LDL: 99  (01/05/2010)   LDL Direct: Not documented   HDL: 53  (01/05/2010)   Triglycerides: 143  (01/05/2010)    SGOT (AST): 18  (01/05/2010)   SGPT (ALT): 21  (01/05/2010)   Alkaline phosphatase: 63  (01/05/2010)   Total bilirubin: 0.5  (01/05/2010)    Lipid flowsheet reviewed?: Yes   Progress toward LDL goal: At goal  Hypertension   Last Blood Pressure: 126 / 82  (10/31/2010)   Serum creatinine: 0.75  (01/05/2010)   Serum potassium 4.6  (01/05/2010)  Hypertension flowsheet reviewed?: Yes   Progress toward BP goal: At goal  Self-Management Support :   Personal Goals (by the next clinic visit) :     Personal A1C goal: 7  (01/24/2010)     Personal blood pressure goal: 130/80  (01/24/2010)     Personal LDL goal: 70  (01/24/2010)     Patient will work on the following items until the next clinic visit to reach self-care goals:     Medications and monitoring: take my medicines every day, examine my feet every day  (10/31/2010)     Eating: eat more vegetables, eat foods that are low in salt, eat baked foods instead of fried foods  (10/31/2010)     Activity: take a 30 minute walk every day  (10/31/2010)    Diabetes self-management support: Written self-care plan  (10/31/2010)   Diabetes care plan printed    Hypertension self-management support: Written self-care plan  (10/31/2010)   Hypertension self-care plan printed.    Lipid self-management support: Written self-care plan  (10/31/2010)   Lipid self-care plan printed.   Nursing Instructions: Request report of last colonoscopy    Laboratory Results   Blood Tests   Date/Time Received: October 31, 2010 8:40 AM  Date/Time Reported: Burke Keels  October 31, 2010 8:40 AM   HGBA1C: 9.2%   (Normal Range: Non-Diabetic - 3-6%   Control Diabetic - 6-8%) CBG Fasting:: 189mg /dL      Appended Document: ABNORMAL LABS/DS Clinical Lists Changes Since sometimes she misses evening dose of oral DM meds, will change to extended meds.   Prescriptions: GLUCOTROL XL 10 MG XR24H-TAB (GLIPIZIDE) Take 1 and 1/2  tablet by mouth once a day  #45 x 3   Entered and Authorized by:   Jackson Latino MD   Signed by:   Jackson Latino MD on 11/01/2010   Method used:   Electronically to        [facility]* (retail)       [address]       [address] Shipping/mailing       [place], [place]  [address]       Ph: [phone]       Fax: [phone]   RxID:   [id number] FORTAMET 1000 MG XR24H-TAB (METFORMIN HCL) Take 1 tablet by mouth once a day  #31 x 3   Entered and Authorized by:   Jackson Latino MD   Signed by:   Jackson Latino MD on 11/01/2010   Method used:   Electronically to        [facility]* (retail)       [address]       [address] Shipping/mailing       [place], [place]  [address]       Ph: [phone]       Fax: [phone]   RxID:   [id number]     Impression & Recommendations:  Problem # 1:  DIABETES MELLITUS, TYPE II (ICD-250.00) Assessment Unchanged  Since sometimes she misses evening dose of oral DM meds, will change to extended meds. She will return in one month.   The following medications were removed from the medication list:    Metformin Hcl 1000 Mg Tabs (Metformin hcl) .Marland Kitchen... Take 1 tablet by mouth two times a day    Glipizide 10 Mg Tabs (Glipizide) .Marland Kitchen... Take 1 and 1/2 tablets by mouth two times a day Her updated medication list  for this problem includes:    Lisinopril 10 Mg Tabs (Lisinopril) .Marland Kitchen... Take 1 tablet by mouth once a day    Fortamet 1000 Mg Xr24h-tab (Metformin hcl) .Marland Kitchen... Take 1 tablet by mouth once a day    Glucotrol Xl 10 Mg Xr24h-tab (Glipizide) .Marland Kitchen... Take 1 and 1/2  tablet by mouth once a day  Labs Reviewed: Creat: 0.75 (01/05/2010)     Last Eye Exam: No diabetic retinopathy.    (08/29/2010) Reviewed HgBA1c results: 9.2 (10/31/2010)  8.3 (08/04/2010)  Complete Medication List: 1)  Pravastatin Sodium 40 Mg Tabs (Pravastatin sodium) .... Take 1 tablet by mouth once a day 2)  Accu-chek Softclix Lancets Misc (Lancets) .... To check blood sugar 2x/day- 3)  Lisinopril 10 Mg Tabs (Lisinopril) .... Take 1 tablet by mouth once a day 4)  Flexeril 5 Mg Tabs (Cyclobenzaprine hcl) .... Take one tab by mouth once every 8 hours as needed for pain 5)  Ibu 800 Mg Tabs (Ibuprofen) .... Take one tab by mouth with food once every 8 hours for one week, then once every 8 hours with food as needed for pain 6)  Furosemide 20 Mg Tabs (Furosemide) .... Take 1 tablet by mouth two times a day 7)  Truetrack Blood Glucose Devi (Blood glucose monitoring suppl) 8)  Truetrack Test Strp (Glucose blood) .... Give 3 months supply each time and soft click lancets to test two times  a day 9)  Fortamet 1000 Mg Xr24h-tab (Metformin hcl) .... Take 1 tablet by mouth once a day 10)  Glucotrol Xl 10 Mg Xr24h-tab (Glipizide) .... Take 1 and 1/2  tablet by mouth once a day  Appended Document: ABNORMAL LABS/DS Clinical Lists Changes  Prescriptions: GLUCOTROL XL 10 MG XR24H-TAB (GLIPIZIDE) Take 1  tablet by mouth once a day  #30 x 2   Entered and Authorized by:   Jackson Latino MD   Signed by:   Jackson Latino MD on 11/01/2010   Method used:   Electronically to        [facility]* (retail)       [address]       [address] Shipping/mailing       [place], [place]  [address]       Ph: [phone]       Fax: [phone]   RxID:   [id number]     Impression & Recommendations:  Problem # 1:  DIABETES MELLITUS, TYPE II (ICD-250.00) Will change glipizide XL from 15 mg to 10 mg as XL tablet can not be cut.  Her updated medication list for this problem includes:    Lisinopril 10 Mg Tabs (Lisinopril) .Marland Kitchen... Take 1 tablet by mouth once a day    Fortamet 1000 Mg Xr24h-tab (Metformin hcl) .Marland Kitchen... Take 1 tablet by mouth once a day    Glucotrol Xl 10 Mg Xr24h-tab (Glipizide) .Marland Kitchen... Take 1  tablet by mouth once a day  Complete Medication List: 1)  Pravastatin Sodium 40 Mg Tabs (Pravastatin sodium) .... Take 1 tablet by mouth once a day 2)  Accu-chek Softclix Lancets Misc (Lancets) .... To check blood sugar 2x/day- 3)  Lisinopril 10 Mg Tabs (Lisinopril) .... Take 1 tablet by mouth once a day 4)  Flexeril 5 Mg Tabs (Cyclobenzaprine hcl) .... Take one tab by mouth once every 8 hours as needed for pain 5)  Ibu 800 Mg Tabs (Ibuprofen) .... Take one tab by mouth with food once every 8 hours for one week, then  once every 8 hours with food as needed for pain 6)  Furosemide 20 Mg Tabs (Furosemide) .... Take 1 tablet by mouth two times a day 7)  Truetrack Blood Glucose Devi (Blood glucose monitoring suppl) 8)  Truetrack Test Strp (Glucose blood) .... Give 3 months  supply each time and soft click lancets to test two times a day 9)  Fortamet 1000 Mg Xr24h-tab (Metformin hcl) .... Take 1 tablet by mouth once a day 10)  Glucotrol Xl 10 Mg Xr24h-tab (Glipizide) .... Take 1  tablet by mouth once a day

## 2011-01-10 NOTE — Assessment & Plan Note (Signed)
Summary: ACUTE-HIGH BLOOD SUGARS FASTING 240 PER HER DIABETIC COUNSLER...   Vital Signs:  Patient profile:   60 year old female Height:      63 inches (160.02 cm) Weight:      171.9 pounds (78.14 kg) BMI:     30.56 Temp:     97.8 degrees F (36.56 degrees C) oral Pulse rate:   97 / minute BP sitting:   121 / 78  (right arm)  Vitals Entered By: Stanton Kidney Ditzler RN (March 30, 2010 1:49 PM) Is Patient Diabetic? Yes Did you bring your meter with you today? No Pain Assessment Patient in pain? yes     Location: left wrist Type: aching Onset of pain  carpel tunnel Nutritional Status BMI of > 30 = obese Nutritional Status Detail appetite good CBG Result 98  Have you ever been in a relationship where you felt threatened, hurt or afraid?denies   Does patient need assistance? Functional Status Self care Ambulation Normal Comments Brace on left wrist. Discuss diabetes and labs 12/2009. Has been on Glipizide 10mg  instead 5mg .   Primary Care Provider:  Jackson Latino MD   History of Present Illness: 60 yo female with Diabetes mellitus, type II, Hyperlipidemia, Hypertension presents to [facility] for regular follow up appointment. She has no concerns at the time. No recent sicknesses or hospitalizaitons. No episodes of chest pain, SOB, palpitations. No specific abdominal or urinary concerns. No recent changes in appetite, weight, sleep patterns, mood. She has been struggling with carpel tunnel syndrom in her left wrist and would like to see specialist for this since she is contemplating having surgery.       Depression History:      The patient denies a depressed mood most of the day and a diminished interest in her usual daily activities.  The patient denies significant weight loss, significant weight gain, insomnia, hypersomnia, psychomotor agitation, psychomotor retardation, fatigue (loss of energy), feelings of worthlessness (guilt), impaired concentration (indecisiveness), and recurrent  thoughts of death or suicide.        The patient denies that she feels like life is not worth living, denies that she wishes that she were dead, and denies that she has thought about ending her life.         Preventive Screening-Counseling & Management  Alcohol-Tobacco     Smoking Status: never  Caffeine-Diet-Exercise     Does Patient Exercise: yes     Type of exercise: walking     Exercise (avg: min/session): 20 min.     Times/week: 4  Problems Prior to Update: 1)  Uri  (ICD-465.9) 2)  Hypercalcemia  (ICD-275.42) 3)  Rotator Cuff Syndrome, Right  (ICD-726.10) 4)  Infection, Urinary Tract Nos  (ICD-599.0) 5)  Hypertension  (ICD-401.9) 6)  Hyperlipidemia  (ICD-272.4) 7)  Diabetes Mellitus, Type II  (ICD-250.00)  Medications Prior to Update: 1)  Metformin Hcl 1000 Mg Tabs (Metformin Hcl) .... Take 1 Tablet By Mouth Two Times A Day 2)  Glipizide 5 Mg Tabs (Glipizide) .... Take 1 Tablet By Mouth Two Times A Day 3)  Pravastatin Sodium 20 Mg  Tabs (Pravastatin Sodium) .... Take 1 Tablet By Mouth Once A Day 4)  Accu-Chek Softclix Lancets   Misc (Lancets) .... To Check Blood Sugar 2x/day- 5)  Lisinopril 10 Mg  Tabs (Lisinopril) .... Take 1 Tablet By Mouth Once A Day 6)  Flexeril 5 Mg Tabs (Cyclobenzaprine Hcl) .... Take One Tab By Mouth Once Every 8 Hours As Needed For Pain 7)  Ibu 800 Mg Tabs (Ibuprofen) .... Take One Tab By Mouth With Food Once Every 8 Hours For One Week, Then Once Every 8 Hours With Food As Needed For Pain 8)  Furosemide 20 Mg Tabs (Furosemide) .... Take 1 Tablet By Mouth Two Times A Day 9)  Truetrack Blood Glucose  Devi (Blood Glucose Monitoring Suppl) 10)  Truetrack Test  Strp (Glucose Blood) .... Give 3 Months Supply Each Time and Soft Click Lancets To Test Two Times A Day 11)  Cheratussin Ac 100-10 Mg/16ml Syrp (Guaifenesin-Codeine) .... Take 1-2 Tsp At Bedtime As Needed For Cough.  Current Medications (verified): 1)  Metformin Hcl 1000 Mg Tabs (Metformin Hcl)  .... Take 1 Tablet By Mouth Two Times A Day 2)  Glipizide 5 Mg Tabs (Glipizide) .... Take 1 Tablet By Mouth Two Times A Day 3)  Pravastatin Sodium 20 Mg  Tabs (Pravastatin Sodium) .... Take 1 Tablet By Mouth Once A Day 4)  Accu-Chek Softclix Lancets   Misc (Lancets) .... To Check Blood Sugar 2x/day- 5)  Lisinopril 10 Mg  Tabs (Lisinopril) .... Take 1 Tablet By Mouth Once A Day 6)  Flexeril 5 Mg Tabs (Cyclobenzaprine Hcl) .... Take One Tab By Mouth Once Every 8 Hours As Needed For Pain 7)  Ibu 800 Mg Tabs (Ibuprofen) .... Take One Tab By Mouth With Food Once Every 8 Hours For One Week, Then Once Every 8 Hours With Food As Needed For Pain 8)  Furosemide 20 Mg Tabs (Furosemide) .... Take 1 Tablet By Mouth Two Times A Day 9)  Truetrack Blood Glucose  Devi (Blood Glucose Monitoring Suppl) 10)  Truetrack Test  Strp (Glucose Blood) .... 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 Mother - DM, HTN Siblings - HTN  Breast Ca - 2 Aunt, Cousin  Social History: Reviewed history from 05/21/2008 and no changes required. 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 Lungs are clear to auscultation, no crackles or wheezes. Heart:  Normal rate and regular rhythm. S1 and S2 normal without gallop, murmur, click, rub or other extra sounds. Abdomen:  Bowel sounds positive,abdomen soft and non-tender without masses, organomegaly or hernias noted. Neurologic:  No cranial nerve deficits noted. Station and gait are normal. Plantar reflexes are down-going bilaterally. DTRs are symmetrical throughout. Sensory, motor and coordinative functions appear intact. Psych:  Cognition and judgment appear intact. Alert and cooperative with normal attention span and concentration. No apparent delusions, illusions, hallucinations  Diabetes Management Exam:    Foot Exam (with socks and/or shoes not present):       Sensory-Monofilament:          Left foot: normal  Right foot: normal   Impression & Recommendations:  Problem # 1:  HYPERTENSION (ICD-401.9) At goal, will continue the same regimen. Her updated medication list for this problem includes:    Lisinopril 10 Mg Tabs (Lisinopril) .Marland Kitchen... Take 1 tablet by mouth once a day    Furosemide 20 Mg Tabs (Furosemide) .Marland Kitchen... Take 1 tablet by mouth two times a day  BP today: 121/78 Prior BP: 115/67 (01/24/2010)  Prior 10 Yr Risk Heart Disease: 15 % (05/07/2007)  Labs Reviewed: K+: 4.6 (01/05/2010) Creat: : 0.75 (01/05/2010)   Chol: 181 (01/05/2010)   HDL: 53 (01/05/2010)   LDL: 99 (01/05/2010)   TG: 143 (01/05/2010)  Her updated medication list for this problem includes:    Lisinopril 10 Mg Tabs (Lisinopril) .Marland Kitchen... Take 1 tablet by mouth once a day    Furosemide 20 Mg Tabs (Furosemide) .Marland Kitchen... Take 1 tablet by mouth two times a day  Problem # 2:  HYPERLIPIDEMIA (ICD-272.4) We are continuing the same regimen, there are no significant chagnes in the last 2 FLP.  Her updated medication list for this  problem includes:    Pravastatin Sodium 20 Mg Tabs (Pravastatin sodium) .Marland Kitchen... Take 1 tablet by mouth once a day  Labs Reviewed: SGOT: 18 (01/05/2010)   SGPT: 21 (01/05/2010)  Prior 10 Yr Risk Heart Disease: 15 % (05/07/2007)   HDL:53 (01/05/2010), 62 (11/02/2008)  LDL:99 (01/05/2010), 82 (11/02/2008)  Chol:181 (01/05/2010), 176 (11/02/2008)  Trig:143 (01/05/2010), 159 (11/02/2008)  Her updated medication list for this problem includes:    Pravastatin Sodium 20 Mg Tabs (Pravastatin sodium) .Marland Kitchen... Take 1 tablet by mouth once a day  Problem # 3:  DIABETES MELLITUS, TYPE II (ICD-250.00)  No significant change since Jan 2011, will have to increase the dose of glipizide to 10 mg and will follow up in 3 months. I have discussed the importance of stric diet compliance in order to avoid insulin.  Her updated medication list for this problem includes:    Metformin Hcl 1000 Mg Tabs (Metformin hcl) .Marland Kitchen... Take 1 tablet by mouth two times a day    Glipizide 10 Mg Tabs (Glipizide) .Marland Kitchen... Take 1 tablet by mouth two times a day    Lisinopril 10 Mg Tabs (Lisinopril) .Marland Kitchen... Take 1 tablet by mouth once a day  Orders: T- Capillary Blood Glucose (21308) T-Hgb A1C (in-house) ([id number])  Labs Reviewed: Creat: 0.75 (01/05/2010)    Reviewed HgBA1c results: 9.6 (03/30/2010)  9.6 (01/05/2010)  Her updated medication list for this problem includes:    Metformin Hcl 1000 Mg Tabs (Metformin hcl) .Marland Kitchen... Take 1 tablet by mouth two times a day    Glipizide 10 Mg Tabs (Glipizide) .Marland Kitchen... Take 1 tablet by mouth two times a day    Lisinopril 10 Mg Tabs (Lisinopril) .Marland Kitchen... Take 1 tablet by mouth once a day  Problem # 4:  Preventive Health Care (ICD-V70.0) Wants referal for DEXA scan.   Problem # 5:  CARPAL TUNNEL SYNDROME, LEFT (ICD-354.0)  Pt also wants referal for carpel tunnel to neurosurgery Dr. Franky Macho. I have called [phone]. She has been struggling with this for some time and has tried mediacal management and  splint with no significant relief. I have explained to her that this might resolve with time btu she is contemplating having surgery done and wants referal.   Orders: Neurosurgeon Referral (Neurosurgeon)  Complete Medication List: 1)  Metformin Hcl 1000 Mg Tabs (Metformin hcl) .... Take 1 tablet by mouth two times a day 2)  Glipizide 10  Mg Tabs (Glipizide) .... Take 1 tablet by mouth two times a day 3)  Pravastatin Sodium 20 Mg Tabs (Pravastatin sodium) .... Take 1 tablet by mouth once a day 4)  Accu-chek Softclix Lancets Misc (Lancets) .... To check blood sugar 2x/day- 5)  Lisinopril 10 Mg Tabs (Lisinopril) .... Take 1 tablet by mouth once a day 6)  Flexeril 5 Mg Tabs (Cyclobenzaprine hcl) .... Take one tab by mouth once every 8 hours as needed for pain 7)  Ibu 800 Mg Tabs (Ibuprofen) .... Take one tab by mouth with food once every 8 hours for one week, then once every 8 hours with food as needed for pain 8)  Furosemide 20 Mg Tabs (Furosemide) .... Take 1 tablet by mouth two times a day 9)  Truetrack Blood Glucose Devi (Blood glucose monitoring suppl) 10)  Truetrack Test Strp (Glucose blood) .... Give 3 months supply each time and soft click lancets to test two times a day  Other Orders: Dexa scan (Dexa scan)  Patient Instructions: 1)  Please schedule a follow-up appointment in 3 months. 2)  Please check your blood pressure regularly, if it is >170 please call clinic at [phone] 3)  Please check your sugar levels regularly and remember to bring the meter with you to the next clinic appointment, if the sugars are > 350 or < 60 please call us at [phone]  Prescriptions: TRUETRACK TEST  STRP (GLUCOSE BLOOD) Give 3 months supply each time and soft click lancets to test two times a day  #3 month x 4   Entered and Authorized by:   Mliss Sax MD   Signed by:   Mliss Sax MD on 03/30/2010   Method used:   Electronically to        [facility]* (retail)       [address]       [address] Shipping/mailing       [place], [place]  [address]       Ph: [phone]       Fax: [phone]   RxID:   [id number] FUROSEMIDE 20 MG TABS (FUROSEMIDE) Take 1 tablet by mouth two times a day  #63 x 4   Entered and Authorized by:   Mliss Sax MD   Signed by:   Mliss Sax MD on 03/30/2010   Method used:   Electronically to        [facility]* (retail)       [address]       [address] Shipping/mailing       [place], [place]  [address]       Ph: [phone]       Fax: [phone]   RxID:   [id number] IBU 800 MG TABS (IBUPROFEN) Take one tab by mouth with food once every 8 hours for one week, then once every 8 hours with food as needed for pain  #93 x prn   Entered and Authorized by:   Mliss Sax MD   Signed by:   Mliss Sax MD on 03/30/2010   Method used:   Electronically to        [facility]* (retail)       [address]       [address] Shipping/mailing       [place], [place]  [address]       Ph: [phone]       Fax: [phone]  RxID:   [id number] FLEXERIL 5 MG TABS (CYCLOBENZAPRINE HCL) Take one tab by mouth once every 8 hours as needed for pain  #30 x 0   Entered and Authorized by:   Mliss Sax MD   Signed by:   Mliss Sax MD on 03/30/2010   Method used:   Electronically to        [facility]* (retail)       [address]       [address] Shipping/mailing       [place], [place]  [address]       Ph: [phone]       Fax: [phone]   RxID:   [id number] LISINOPRIL 10 MG  TABS (LISINOPRIL) Take 1 tablet by mouth once a day  #93 Tablet x 0   Entered and Authorized by:   Mliss Sax MD   Signed by:   Mliss Sax MD on 03/30/2010   Method used:   Electronically to        [facility]* (retail)       [address]       [address] Shipping/mailing       [place], [place]  [address]       Ph: [phone]       Fax:  [phone]   RxID:   [id number] ACCU-CHEK SOFTCLIX LANCETS   MISC (LANCETS) to check blood sugar 2x/day- Brand medically necessary #200 x 12   Entered and Authorized by:   Mliss Sax MD   Signed by:   Mliss Sax MD on 03/30/2010   Method used:   Electronically to        [facility]* (retail)       [address]       [address] Shipping/mailing       [place], [place]  [address]       Ph: [phone]       Fax: [phone]   RxID:   [id number] PRAVASTATIN SODIUM 20 MG  TABS (PRAVASTATIN SODIUM) Take 1 tablet by mouth once a day  #93 x 5   Entered and Authorized by:   Mliss Sax MD   Signed by:   Mliss Sax MD on 03/30/2010   Method used:   Electronically to        [facility]* (retail)       [address]       [address] Shipping/mailing       [place], [place]  [address]       Ph: [phone]       Fax: [phone]   RxID:   [id number] METFORMIN HCL 1000 MG TABS (METFORMIN HCL) Take 1 tablet by mouth two times a day  #186 Tablet x 12   Entered and Authorized by:   Mliss Sax MD   Signed by:   Mliss Sax MD on 03/30/2010   Method used:   Electronically to        [facility]* (retail)       [address]       [address] Shipping/mailing       [place], [place]  [address]       Ph: [phone]       Fax: [phone]   RxID:   [id number] GLIPIZIDE 10 MG TABS (GLIPIZIDE) Take 1 tablet by mouth two times a day  #60 x 3  Entered and Authorized by:   Mliss Sax MD   Signed by:   Mliss Sax MD on 03/30/2010   Method used:   Electronically to        [facility]* (retail)       [address]       [address] Shipping/mailing       [place], [place]  [address]       Ph: [phone]       Fax: [phone]   RxID:   [id number]    Diabetic Foot Exam Foot Inspection Is there a history of a foot ulcer?              No Is there a foot ulcer now?               No Can the patient see the bottom of their feet?          No Are the shoes appropriate in style and fit?          No Is there swelling or an abnormal foot shape?          No Are the toenails long?                No Are the toenails thick?                No Are the toenails ingrown?              No Is there heavy callous build-up?              No Is there pain in the calf muscle (Intermittent claudication) when walking?    NoIs there a claw toe deformity?              No Is there elevated skin temperature?            No Is there limited ankle dorsiflexion?            No Is there foot or ankle muscle weakness?            No  Diabetic Foot Care Education Patient educated on appropriate care of diabetic feet.   High Risk Feet? No   10-g (5.07) Semmes-Weinstein Monofilament Test           Right Foot          Left Foot Visual Inspection     normal           normal Test Control      normal         normal Site 1         normal         normal Site 2         normal         normal Site 3         normal         normal Site 4         normal         normal Site 5         normal         normal Site 6         normal         normal Site 7         normal         normal Site 8         normal  normal Site 9         normal         normal Site 10         normal         normal  Impression      normal         normal   Prevention & Chronic Care Immunizations   Influenza vaccine: Historical  (11/10/2009)   Influenza vaccine deferral: Not indicated  (01/04/2010)    Tetanus booster: Not documented   Td booster deferral: Not indicated  (03/30/2010)    Pneumococcal vaccine: Not documented  Colorectal Screening   Hemoccult: Not documented   Hemoccult action/deferral: Not indicated  (03/30/2010)    Colonoscopy: Not documented   Colonoscopy action/deferral: Not indicated  (03/30/2010)  Other Screening   Pap smear: Not documented   Pap smear action/deferral: Not indicated S/P  hysterectomy  (01/04/2010)    Mammogram: ASSESSMENT: Negative - BI-RADS 1^MM DIGITAL SCREENING  (12/30/2009)   Mammogram due: 12/30/2010   Smoking status: never  (03/30/2010)  Diabetes Mellitus   HgbA1C: 9.6  (03/30/2010)   HgbA1C action/deferral: Ordered  (01/04/2010)    Eye exam: Not documented   Diabetic eye exam action/deferral: Deferred  (03/30/2010)    Foot exam: yes  (03/30/2010)   Foot exam action/deferral: Do today   High risk foot: No  (03/30/2010)   Foot care education: Done  (03/30/2010)    Urine microalbumin/creatinine ratio: 4.5  (01/05/2010)   Urine microalbumin action/deferral: Ordered    Diabetes flowsheet reviewed?: Yes   Progress toward A1C goal: Unchanged  Lipids   Total Cholesterol: 181  (01/05/2010)   Lipid panel action/deferral: Lipid Panel ordered   LDL: 99  (01/05/2010)   LDL Direct: Not documented   HDL: 53  (01/05/2010)   Triglycerides: 143  (01/05/2010)    SGOT (AST): 18  (01/05/2010)   SGPT (ALT): 21  (01/05/2010)   Alkaline phosphatase: 63  (01/05/2010)   Total bilirubin: 0.5  (01/05/2010)    Lipid flowsheet reviewed?: Yes   Progress toward LDL goal: Unchanged  Hypertension   Last Blood Pressure: 121 / 78  (03/30/2010)   Serum creatinine: 0.75  (01/05/2010)   Serum potassium 4.6  (01/05/2010)    Hypertension flowsheet reviewed?: Yes   Progress toward BP goal: At goal  Self-Management Support :   Personal Goals (by the next clinic visit) :     Personal A1C goal: 7  (01/24/2010)     Personal blood pressure goal: 130/80  (01/24/2010)     Personal LDL goal: 70  (01/24/2010)    Patient will work on the following items until the next clinic visit to reach self-care goals:     Medications and monitoring: take my medicines every day, check my blood sugar, check my blood pressure, bring all of my medications to every visit, weigh myself weekly, examine my feet every day  (03/30/2010)     Eating: drink diet soda or water instead of juice  or soda, eat more vegetables, use fresh or frozen vegetables, eat foods that are low in salt, eat fruit for snacks and desserts, limit or avoid alcohol  (03/30/2010)     Activity: take a 30 minute walk every day, take the stairs instead of the elevator  (03/30/2010)    Diabetes self-management support: Written self-care plan, Education handout, Resources for patients handout  (03/30/2010)   Diabetes care plan printed   Diabetes education handout printed    Hypertension self-management support: Written self-care  plan, Education handout, Resources for patients handout  (03/30/2010)   Hypertension self-care plan printed.   Hypertension education handout printed    Lipid self-management support: Written self-care plan, Education handout, Resources for patients handout  (03/30/2010)   Lipid self-care plan printed.   Lipid education handout printed      Resource handout printed.   Nursing Instructions: Diabetic foot exam today   Laboratory Results   Blood Tests   Date/Time Received: March 30, 2010 2:09 PM  Date/Time Reported: Burke Keels  March 30, 2010 2:09 PM   HGBA1C: 9.6%   (Normal Range: Non-Diabetic - 3-6%   Control Diabetic - 6-8%) CBG Random:: 98mg /dL

## 2011-01-10 NOTE — Progress Notes (Signed)
Summary: med refill/gp  Phone Note Refill Request Message from:  Fax from Pharmacy on March 10, 2010 3:46 PM  Refills Requested: Medication #1:  LISINOPRIL 10 MG  TABS Take 1 tablet by mouth once a day   Dosage confirmed as above?Dosage Confirmed   Last Refilled: 12/13/2009  Method Requested: Electronic Initial call taken by: Chinita Pester RN,  March 10, 2010 3:46 PM  Follow-up for Phone Call        Rx completed in Dr. Tiajuana Amass Follow-up by: Jackson Latino MD,  March 10, 2010 5:28 PM    Prescriptions: LISINOPRIL 10 MG  TABS (LISINOPRIL) Take 1 tablet by mouth once a day  #93 Tablet x 0   Entered and Authorized by:   Jackson Latino MD   Signed by:   Jackson Latino MD on 03/10/2010   Method used:   Electronically to        [facility]* (retail)       [address]       [address]. Shipping/mailing       [place], [place]  [address]       Ph: [phone]       Fax: [phone]   RxID:   [id number]

## 2011-01-10 NOTE — Letter (Signed)
Summary: [facility]  [place] [facility]: [facility]   Imported By: Florinda Marker 01/17/2010 15:16:04  _____________________________________________________________________  External Attachment:    Type:   Image     Comment:   External Document

## 2011-01-10 NOTE — Assessment & Plan Note (Signed)
Summary: ACUTE/YANG/DM/CH   Vital Signs:  Patient profile:   60 year old female Height:      63 inches Weight:      174.0 pounds BMI:     30.93 Temp:     97.5 degrees F oral Pulse rate:   90 / minute BP sitting:   111 / 73  (right arm)  Vitals Entered By: Filomena Jungling NT II (August 04, 2010 3:37 PM) CC: need refill on motrin/ right side of foot hurt x 1 month Is Patient Diabetic? Yes Did you bring your meter with you today? No Pain Assessment Patient in pain? yes     Location: side of foot Intensity: 5 Type: aching Onset of pain  Intermittent Nutritional Status BMI of > 30 = obese CBG Result 108  Have you ever been in a relationship where you felt threatened, hurt or afraid?No   Does patient need assistance? Functional Status Self care Ambulation Normal   Primary Care Provider:  Jackson Latino MD  CC:  need refill on motrin/ right side of foot hurt x 1 month.  History of Present Illness: 60 yo female with Diabetes mellitus, type II, Hyperlipidemia, Hypertension presents to [facility] for regular follow up appointment. She has no concerns at the time except for mild foot pain when she walk that appears to be more chronic than acute.   DM: a1c is 8.3 today, improved. pt is uptodate on preventitive care.   HTN: BP well controlled.   HLD: LDL well controlled.   Patient is feeling well and denies CP, abdominal pain, nausea, vomiting, HA's, palpitations, blurred vision. fever, chills, diarrhea, constipation or SOB.    Preventive Screening-Counseling & Management  Alcohol-Tobacco     Smoking Status: never  Caffeine-Diet-Exercise     Does Patient Exercise: yes     Type of exercise: walking     Exercise (avg: min/session): 20 min.     Times/week: 4  Current Medications (verified): 1)  Metformin Hcl 1000 Mg Tabs (Metformin Hcl) .... Take 1 Tablet By Mouth Two Times A Day 2)  Glipizide 10 Mg Tabs (Glipizide) .... Take 1 Tablet By Mouth Two Times A Day 3)  Pravastatin  Sodium 40 Mg Tabs (Pravastatin Sodium) .... Take 1 Tablet By Mouth Once A Day 4)  Accu-Chek Softclix Lancets   Misc (Lancets) .... To Check Blood Sugar 2x/day- 5)  Lisinopril 10 Mg  Tabs (Lisinopril) .... Take 1 Tablet By Mouth Once A Day 6)  Flexeril 5 Mg Tabs (Cyclobenzaprine Hcl) .... Take One Tab By Mouth Once Every 8 Hours As Needed For Pain 7)  Ibu 800 Mg Tabs (Ibuprofen) .... Take One Tab By Mouth With Food Once Every 8 Hours For One Week, Then Once Every 8 Hours With Food As Needed For Pain 8)  Furosemide 20 Mg Tabs (Furosemide) .... Take 1 Tablet By Mouth Two Times A Day 9)  Truetrack Blood Glucose  Devi (Blood Glucose Monitoring Suppl) 10)  Truetrack Test  Strp (Glucose Blood) .... Give 3 Months Supply Each Time and Soft Click Lancets To Test Two Times A Day  Allergies (verified): No Known Drug Allergies  Review of Systems       as PER HPI  Physical Exam  General:  Well-developed,well-nourished,in no acute distress; alert,appropriate and cooperative throughout examination Lungs:  Normal respiratory effort, chest expands symmetrically. Lungs are clear to auscultation, no crackles or wheezes. Heart:  Normal rate and regular rhythm. S1 and S2 normal without gallop, murmur, click, rub or  other extra sounds. Abdomen:  Bowel sounds positive,abdomen soft and non-tender without masses, organomegaly or hernias noted. Extremities:  no sign of external deformity. no edema. Neurologic:  AOx3.   Impression & Recommendations:  Problem # 1:  DIABETES MELLITUS, TYPE II (ICD-250.00) Assessment Improved  A1c improved, pt is willing to try diet, wants to hold off on starting new meds such as pioglitazone.  will recheck in 3 months.   Her updated medication list for this problem includes:    Metformin Hcl 1000 Mg Tabs (Metformin hcl) .Marland Kitchen... Take 1 tablet by mouth two times a day    Glipizide 10 Mg Tabs (Glipizide) .Marland Kitchen... Take 1 tablet by mouth two times a day    Lisinopril 10 Mg Tabs  (Lisinopril) .Marland Kitchen... Take 1 tablet by mouth once a day  Orders: T- Capillary Blood Glucose ([address]) T-Hgb A1C (in-house) ([id number]) Ophthalmology Referral (Ophthalmology)  Her updated medication list for this problem includes:    Metformin Hcl 1000 Mg Tabs (Metformin hcl) .Marland Kitchen... Take 1 tablet by mouth two times a day    Glipizide 10 Mg Tabs (Glipizide) .Marland Kitchen... Take 1 tablet by mouth two times a day    Lisinopril 10 Mg Tabs (Lisinopril) .Marland Kitchen... Take 1 tablet by mouth once a day  Problem # 2:  HYPERLIPIDEMIA (ICD-272.4) Assessment: Comment Only  will increase pravastatin today as last LDL was 99, and target for DM patients is 70 (as having a target of <70 of LDL has been shown to reduce longterm CV complications from DM with RRR of ACS by 36% and of stroke RRR by 48%, as shown in CARDS trial that was published in [place] in 2004)   Her updated medication list for this problem includes:    Pravastatin Sodium 40 Mg Tabs (Pravastatin sodium) .Marland Kitchen... Take 1 tablet by mouth once a day  Her updated medication list for this problem includes:    Pravastatin Sodium 40 Mg Tabs (Pravastatin sodium) .Marland Kitchen... Take 1 tablet by mouth once a day  Problem # 3:  HYPERTENSION (ICD-401.9) Assessment: Comment Only  Well controlled on current treatment, No new changes made today, Will continue to monitor.   Her updated medication list for this problem includes:    Lisinopril 10 Mg Tabs (Lisinopril) .Marland Kitchen... Take 1 tablet by mouth once a day    Furosemide 20 Mg Tabs (Furosemide) .Marland Kitchen... Take 1 tablet by mouth two times a day  Her updated medication list for this problem includes:    Lisinopril 10 Mg Tabs (Lisinopril) .Marland Kitchen... Take 1 tablet by mouth once a day    Furosemide 20 Mg Tabs (Furosemide) .Marland Kitchen... Take 1 tablet by mouth two times a day  Complete Medication List: 1)  Metformin Hcl 1000 Mg Tabs (Metformin hcl) .... Take 1 tablet by mouth two times a day 2)  Glipizide 10 Mg Tabs (Glipizide) .... Take 1 tablet by mouth two  times a day 3)  Pravastatin Sodium 40 Mg Tabs (Pravastatin sodium) .... Take 1 tablet by mouth once a day 4)  Accu-chek Softclix Lancets Misc (Lancets) .... To check blood sugar 2x/day- 5)  Lisinopril 10 Mg Tabs (Lisinopril) .... Take 1 tablet by mouth once a day 6)  Flexeril 5 Mg Tabs (Cyclobenzaprine hcl) .... Take one tab by mouth once every 8 hours as needed for pain 7)  Ibu 800 Mg Tabs (Ibuprofen) .... Take one tab by mouth with food once every 8 hours for one week, then once every 8 hours with food as needed for pain 8)  Furosemide 20 Mg Tabs (Furosemide) .... Take 1 tablet by mouth two times a day 9)  Truetrack Blood Glucose Devi (Blood glucose monitoring suppl) 10)  Truetrack Test Strp (Glucose blood) .... Give 3 months supply each time and soft click lancets to test two times a day  Patient Instructions: 1)  Please schedule a follow-up appointment in 3 months. Prescriptions: PRAVASTATIN SODIUM 40 MG TABS (PRAVASTATIN SODIUM) Take 1 tablet by mouth once a day  #30 x 3   Entered and Authorized by:   Darnelle Maffucci MD   Signed by:   Darnelle Maffucci MD on 08/04/2010   Method used:   Electronically to        [facility]* (retail)       [address]       [address] Shipping/mailing       [place], [place]  [address]       Ph: [phone]       Fax: [phone]   RxID:   [id number] IBU 800 MG TABS (IBUPROFEN) Take one tab by mouth with food once every 8 hours for one week, then once every 8 hours with food as needed for pain  #93 x prn   Entered and Authorized by:   Darnelle Maffucci MD   Signed by:   Darnelle Maffucci MD on 08/04/2010   Method used:   Electronically to        [facility]* (retail)       [address]       [address] Shipping/mailing       [place], [place]  [address]       Ph: [phone]       Fax: [phone]   RxID:   [id number]   Prevention & Chronic Care Immunizations   Influenza vaccine: Historical   (11/10/2009)   Influenza vaccine deferral: Not indicated  (01/04/2010)    Tetanus booster: Not documented   Td booster deferral: Not indicated  (03/30/2010)    Pneumococcal vaccine: Not documented    H. zoster vaccine: Not documented  Colorectal Screening   Hemoccult: Not documented   Hemoccult action/deferral: Not indicated  (03/30/2010)    Colonoscopy: Not documented   Colonoscopy action/deferral: Not indicated  (03/30/2010)  Other Screening   Pap smear: Not documented   Pap smear action/deferral: Not indicated S/P hysterectomy  (01/04/2010)    Mammogram: ASSESSMENT: Negative - BI-RADS 1^MM DIGITAL SCREENING  (12/30/2009)   Mammogram due: 12/30/2010    DXA bone density scan: Not documented   Smoking status: never  (08/04/2010)  Diabetes Mellitus   HgbA1C: 8.3  (08/04/2010)   HgbA1C action/deferral: Ordered  (01/04/2010)    Eye exam: Not documented   Diabetic eye exam action/deferral: Ophthalmology referral  (08/04/2010)    Foot exam: yes  (03/30/2010)   Foot exam action/deferral: Do today   High risk foot: No  (03/30/2010)   Foot care education: Done  (03/30/2010)    Urine microalbumin/creatinine ratio: 4.5  (01/05/2010)   Urine microalbumin action/deferral: Ordered    Diabetes flowsheet reviewed?: Yes   Progress toward A1C goal: Improved  Lipids   Total Cholesterol: 181  (01/05/2010)   Lipid panel action/deferral: Lipid Panel ordered   LDL: 99  (01/05/2010)   LDL Direct: Not documented   HDL: 53  (01/05/2010)   Triglycerides: 143  (01/05/2010)    SGOT (AST): 18  (01/05/2010)   SGPT (ALT): 21  (01/05/2010)   Alkaline phosphatase: 63  (  01/05/2010)   Total bilirubin: 0.5  (01/05/2010)    Lipid flowsheet reviewed?: Yes   Progress toward LDL goal: At goal  Hypertension   Last Blood Pressure: 111 / 73  (08/04/2010)   Serum creatinine: 0.75  (01/05/2010)   Serum potassium 4.6  (01/05/2010)    Hypertension flowsheet reviewed?: Yes   Progress toward BP  goal: At goal  Self-Management Support :   Personal Goals (by the next clinic visit) :     Personal A1C goal: 7  (01/24/2010)     Personal blood pressure goal: 130/80  (01/24/2010)     Personal LDL goal: 70  (01/24/2010)    Patient will work on the following items until the next clinic visit to reach self-care goals:     Medications and monitoring: take my medicines every day, check my blood sugar, bring all of my medications to every visit  (08/04/2010)     Eating: drink diet soda or water instead of juice or soda, eat more vegetables, use fresh or frozen vegetables, eat foods that are low in salt, eat baked foods instead of fried foods, eat fruit for snacks and desserts, limit or avoid alcohol  (08/04/2010)     Activity: take a 30 minute walk every day, take the stairs instead of the elevator  (08/04/2010)    Diabetes self-management support: Education handout, Resources for patients handout, Written self-care plan  (08/04/2010)   Diabetes care plan printed   Diabetes education handout printed    Hypertension self-management support: Education handout, Resources for patients handout, Written self-care plan  (08/04/2010)   Hypertension self-care plan printed.   Hypertension education handout printed    Lipid self-management support: Education handout, Resources for patients handout, Written self-care plan  (08/04/2010)   Lipid self-care plan printed.   Lipid education handout printed      Resource handout printed.   Nursing Instructions: Refer for screening diabetic eye exam (see order)      Laboratory Results   Blood Tests   Date/Time Received: August 04, 2010 4:01 PM  Date/Time Reported: Burke Keels  August 04, 2010 4:02 PM   HGBA1C: 8.3%   (Normal Range: Non-Diabetic - 3-6%   Control Diabetic - 6-8%) CBG Random:: 108mg /dL

## 2011-01-10 NOTE — Assessment & Plan Note (Signed)
Summary: diabetes/dmr   Vital Signs:  Patient profile:   60 year old female Weight:      172.4 pounds BP sitting:   132 / 74  (left arm) CBG Result 206 Comments bloo sugar 1.5 hours after eating, was 186 fasting this am- has been tkaing both doses of medicine, ate  ~ 58 grams carb for breakfast today.   MEDICAL NUTRITION THERAPY  Assessment:Patient is here to follow up on diabetes. Today was a brief visits due to patient time contraints-   AV:[id number] 1 pound Medications: reports she is taking now as prescribed Blood sugars:did nto have meter today- reports blood sugar fine before nad after lunch, most fastings are 180-190 range Breakfast recall: cheerios, half and apple, 8 oz milk , hard boiled egg estimated to be  ~ 58 grams carb, ? grams fat Exercise:did not have time to discuss how she is doing with this today Estimated needs:  1200-1500 calories & 33-42 g fat/day   Diagnosis:  NB 1.1- improving and still needs assistance. NB 1.4- improving and still needs assistance  Intervention:  1- To test before and after dinner to troubleshoot fasting blood sugars above goal 2- Evaluate food intake for carbs and possible fat content if difference between before and after meal blood sugar is > 50 points. 2- Bring meter to next visit  Monitoring: blood sugars before and after dinner, understanding of how to use results of self monitoirng to make changes Evaluation:A1C and weight  Follow-up: 2 weeks  Allergies: No Known Drug Allergies   Complete Medication List: 1)  Metformin Hcl 1000 Mg Tabs (Metformin hcl) .... Take 1 tablet by mouth two times a day 2)  Glipizide 10 Mg Tabs (Glipizide) .... Take 1 tablet by mouth two times a day 3)  Pravastatin Sodium 40 Mg Tabs (Pravastatin sodium) .... Take 1 tablet by mouth once a day 4)  Accu-chek Softclix Lancets Misc (Lancets) .... To check blood sugar 2x/day- 5)  Lisinopril 10 Mg Tabs (Lisinopril) .... Take 1 tablet by mouth once a  day 6)  Flexeril 5 Mg Tabs (Cyclobenzaprine hcl) .... Take one tab by mouth once every 8 hours as needed for pain 7)  Ibu 800 Mg Tabs (Ibuprofen) .... Take one tab by mouth with food once every 8 hours for one week, then once every 8 hours with food as needed for pain 8)  Furosemide 20 Mg Tabs (Furosemide) .... Take 1 tablet by mouth two times a day 9)  Truetrack Blood Glucose Devi (Blood glucose monitoring suppl) 10)  Truetrack Test Strp (Glucose blood) .... Give 3 months supply each time and soft click lancets to test two times a day  Other Orders: MNT/Initial Visit and Intervention, 15 minutes ([address])   Orders Added: 1)  MNT/Initial Visit and Intervention, 15 minutes [[id number]]

## 2011-01-10 NOTE — Progress Notes (Signed)
Summary: check on referral/ hla  Phone Note Call from Patient   Summary of Call: pt called to check on progress of neuro referral, informed it may take about 1 wk, she will be notified, she is agreeable also told that if pain becomes worse call back and f/u here will be given, verb acknowledges Initial call taken by: Marin Roberts RN,  April 04, 2010 4:33 PM

## 2011-01-10 NOTE — Progress Notes (Signed)
Summary: refill/ hla  Phone Note Refill Request Message from:  Fax from Pharmacy on August 23, 2010 4:07 PM  Refills Requested: Medication #1:  GLIPIZIDE 10 MG TABS Take 1 tablet by mouth two times a day   Dosage confirmed as above?Dosage Confirmed   Last Refilled: 8/15 last visit 8/25, last labs 12/2009  Initial call taken by: Marin Roberts RN,  August 23, 2010 4:07 PM  Follow-up for Phone Call        Rx completed in Dr. Tiajuana Amass Follow-up by: Jackson Latino MD,  August 24, 2010 2:16 PM    Prescriptions: GLIPIZIDE 10 MG TABS (GLIPIZIDE) Take 1 tablet by mouth two times a day  #180 x 3   Entered and Authorized by:   Jackson Latino MD   Signed by:   Jackson Latino MD on 08/24/2010   Method used:   Electronically to        [facility]* (retail)       [address]       [address] Shipping/mailing       [place], [place]  [address]       Ph: [phone]       Fax: [phone]   RxID:   [id number]

## 2011-01-10 NOTE — Progress Notes (Signed)
Summary: [facility] Appointment/dmr  Phone Note Other Incoming   Caller: patient Summary of Call: has been scheduled with Family Practice RD through [facility] and prefers to schedule with IM RD, CDE instead. Appointment scheduled for 08/31/10 @ 8 :30 AM. (Patient is an IM patient.) Initial call taken by: Jamison Neighbor RD,CDE,  August 30, 2010 10:28 AM

## 2011-01-10 NOTE — Assessment & Plan Note (Signed)
Summary: EST-CK/FU/MEDS/CFB   Vital Signs:  Patient profile:   60 year old female Height:      63 inches Weight:      174.7 pounds BMI:     31.06 Temp:     97.6 degrees F oral Pulse rate:   97 / minute BP sitting:   134 / 85  (right arm)  Vitals Entered By: Filomena Jungling NT II (January 04, 2010 4:09 PM) Nutritional Status BMI of > 30 = obese   Primary Care Provider:  Jackson Latino MD   History of Present Illness: Pt is a 60 yo AAF with PMH of DM, HTN and HLD who came to the Clinic for regular f/u and med refill. She has no c/o, and has been taking all her meds as instructed. No melena, appetite change or dysuria. Denies smoking or ETOH. Wants to get yearly check.  Problems Prior to Update: 1)  Rotator Cuff Syndrome, Right  (ICD-726.10) 2)  Uri  (ICD-465.9) 3)  Infection, Urinary Tract Nos  (ICD-599.0) 4)  Symptom, Headache  (ICD-784.0) 5)  Palpitations  (ICD-785.1) 6)  Hypertension  (ICD-401.9) 7)  Hyperlipidemia  (ICD-272.4) 8)  Diabetes Mellitus, Type II  (ICD-250.00)  Medications Prior to Update: 1)  Metformin Hcl 1000 Mg Tabs (Metformin Hcl) .... Take 1 Tablet By Mouth Two Times A Day 2)  Glipizide 5 Mg Tabs (Glipizide) .... Take 1 Tablet By Mouth Two Times A Day 3)  Hydrochlorothiazide 12.5 Mg Tabs (Hydrochlorothiazide) .... Take 1 Capsule By Mouth Once A Day 4)  Accu-Chek Compact Test Drum  Strp (Glucose Blood) .... Accucheck Compact Plus -3 Drums(102 Strips)/month and Soft Click Lancets To Test Two Times A Day 5)  Pravastatin Sodium 20 Mg  Tabs (Pravastatin Sodium) .... Take 1 Tablet By Mouth Once A Day 6)  Accu-Chek Softclix Lancets   Misc (Lancets) .... To Check Blood Sugar 2x/day- 7)  Lisinopril 10 Mg  Tabs (Lisinopril) .... Take 1 Tablet By Mouth Once A Day 8)  Flexeril 5 Mg Tabs (Cyclobenzaprine Hcl) .... Take One Tab By Mouth Once Every 8 Hours As Needed For Pain 9)  Ibu 800 Mg Tabs (Ibuprofen) .... Take One Tab By Mouth With Food Once Every 8 Hours For One  Week, Then Once Every 8 Hours With Food As Needed For Pain  Current Medications (verified): 1)  Metformin Hcl 1000 Mg Tabs (Metformin Hcl) .... Take 1 Tablet By Mouth Two Times A Day 2)  Glipizide 5 Mg Tabs (Glipizide) .... Take 1 Tablet By Mouth Two Times A Day 3)  Hydrochlorothiazide 12.5 Mg Tabs (Hydrochlorothiazide) .... Take 1 Capsule By Mouth Once A Day 4)  Accu-Chek Compact Test Drum  Strp (Glucose Blood) .... Accucheck Compact Plus -3 Drums(102 Strips)/month and Soft Click Lancets To Test Two Times A Day 5)  Pravastatin Sodium 20 Mg  Tabs (Pravastatin Sodium) .... Take 1 Tablet By Mouth Once A Day 6)  Accu-Chek Softclix Lancets   Misc (Lancets) .... To Check Blood Sugar 2x/day- 7)  Lisinopril 10 Mg  Tabs (Lisinopril) .... Take 1 Tablet By Mouth Once A Day 8)  Flexeril 5 Mg Tabs (Cyclobenzaprine Hcl) .... Take One Tab By Mouth Once Every 8 Hours As Needed For Pain 9)  Ibu 800 Mg Tabs (Ibuprofen) .... 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 Mother - DM, HTN Siblings - HTN  Breast Ca - 2 Aunt, Cousin  Social History: Reviewed history from 05/21/2008 and no changes required. 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    Physical Exam  General:  alert, well-developed, well-nourished, and well-hydrated.   Head:  normocephalic.   Eyes:  vision grossly intact.   Ears:  no external deformities.   Nose:  no nasal discharge.   Mouth:  pharynx pink and moist.   Neck:  supple.   Lungs:  normal respiratory effort, normal breath sounds, no crackles, and no wheezes.   Heart:  normal rate, regular rhythm, no murmur, and no JVD.   Abdomen:  soft, non-tender, normal bowel sounds, no distention, and no masses.   Msk:  normal ROM, no joint tenderness, no joint swelling, and no joint warmth.   Pulses:  2+ Extremities:  No edema. Neurologic:  alert & oriented X3, cranial nerves II-XII intact, strength normal in all extremities, sensation intact to light touch, and gait normal.     Impression & Recommendations:  Problem # 1:  DIABETES MELLITUS, TYPE II (ICD-250.00) Assessment Unchanged Has been well controled near target and will check A1C and yearly urine microalbumin w/creat. ratio. Advised weight loss, exercise and healthy diet. Continue current meds. She will have eye exam soon and refuse foot exam. Her updated medication list for this problem includes:    Metformin Hcl 1000 Mg Tabs (Metformin hcl) .Marland Kitchen... Take 1 tablet by mouth two times a day    Glipizide 5 Mg Tabs (Glipizide) .Marland Kitchen... Take 1 tablet by mouth two times a day    Lisinopril 10 Mg Tabs (Lisinopril) .Marland Kitchen... Take 1 tablet by mouth once a day  Orders: T-Hgb A1C (in-house) ([id number]) T-Urine Microalbumin w/creat. ratio [phone])  Labs Reviewed: Creat: 0.75 (11/02/2008)    Reviewed HgBA1c results: 7.5 (11/02/2008)  7.0 (05/21/2008)  Problem # 2:  HYPERTENSION (ICD-401.9) Assessment: Unchanged Her BP well controlled and continue current meds. Will check CMET for kidney  function and electrolytes. Her updated medication list for this problem includes:    Hydrochlorothiazide 12.5 Mg Tabs (Hydrochlorothiazide) .Marland Kitchen... Take 1 capsule by mouth once a day    Lisinopril 10 Mg Tabs (Lisinopril) .Marland Kitchen... Take 1 tablet by mouth once a day  Orders: T-Comprehensive Metabolic Panel ([id number])  BP today: 134/85 Prior BP: 124/70 (07/26/2009)  Prior 10 Yr Risk Heart Disease: 15 % (05/07/2007)  Labs Reviewed: K+: 4.7 (11/02/2008) Creat: : 0.75 (11/02/2008)   Chol: 176 (11/02/2008)   HDL: 62 (11/02/2008)   LDL: 82 (11/02/2008)   TG: 159 (11/02/2008)  Problem # 3:  HYPERLIPIDEMIA (ICD-272.4) Assessment: Unchanged  Will check FLP and may adjust pravastatin dose based on her LDL level. Her updated medication list for this problem includes:    Pravastatin Sodium 20 Mg Tabs (Pravastatin sodium) .Marland Kitchen... Take 1 tablet by  mouth once a day  Orders: T-Lipid Profile [id number] [id number]) T-Comprehensive Metabolic Panel [phone])  Labs Reviewed: SGOT: 19 (11/02/2008)   SGPT: 20 (11/02/2008)  Prior 10 Yr Risk Heart Disease: 15 % (05/07/2007)   HDL:62 (11/02/2008), 53 (02/11/2008)  LDL:82 (11/02/2008), 119 (29/56/2130)  Chol:176 (11/02/2008), 208 (02/11/2008)  Trig:159 (11/02/2008), 178 (02/11/2008)  Complete Medication List: 1)  Metformin Hcl 1000 Mg Tabs (Metformin hcl) .... Take 1 tablet by mouth two times a day 2)  Glipizide 5 Mg Tabs (Glipizide) .... Take 1 tablet by mouth two times a day 3)  Hydrochlorothiazide 12.5 Mg Tabs (Hydrochlorothiazide) .... Take 1 capsule by mouth once a day 4)  Accu-chek Compact Test Drum Strp (Glucose blood) .... Accucheck compact plus -3 drums(102 strips)/month and soft click lancets to test two times a day 5)  Pravastatin Sodium 20 Mg Tabs (Pravastatin sodium) .... Take 1 tablet by mouth once a day 6)  Accu-chek Softclix Lancets Misc (Lancets) .... To check blood sugar 2x/day- 7)  Lisinopril 10 Mg Tabs (Lisinopril) .... Take 1 tablet by mouth  once a day 8)  Flexeril 5 Mg Tabs (Cyclobenzaprine hcl) .... Take one tab by mouth once every 8 hours as needed for pain 9)  Ibu 800 Mg Tabs (Ibuprofen) .... Take one tab by mouth with food once every 8 hours for one week, then once every 8 hours with food as needed for pain  Patient Instructions: 1)  Please schedule a follow-up appointment in 1 year. 2)  Will call you if any abnormal lab results.  Prescriptions: FLEXERIL 5 MG TABS (CYCLOBENZAPRINE HCL) Take one tab by mouth once every 8 hours as needed for pain  #30 x 0   Entered and Authorized by:   Jackson Latino MD   Signed by:   Jackson Latino MD on 01/04/2010   Method used:   Electronically to        [facility]* (retail)       [address]       [address] Shipping/mailing       [place], [place]  [address]       Ph: [phone]       Fax: [phone]   RxID:   [id number] PRAVASTATIN SODIUM 20 MG  TABS (PRAVASTATIN SODIUM) Take 1 tablet by mouth once a day  #93 x 5   Entered and Authorized by:   Jackson Latino MD   Signed by:   Jackson Latino MD on 01/04/2010   Method used:   Electronically to        [facility]* (retail)       [address]       [address] Shipping/mailing       [place], [place]  [address]       Ph: [phone]       Fax: [phone]   RxID:   [id number] HYDROCHLOROTHIAZIDE 12.5 MG TABS (HYDROCHLOROTHIAZIDE) Take 1 capsule by mouth once a day  #93 x 5   Entered and Authorized by:   Jackson Latino MD   Signed by:   Jackson Latino MD on 01/04/2010   Method used:   Electronically to        [facility]* (retail)       [address]       [address] Shipping/mailing       [place], [place]  [address]       Ph: [phone]  Fax: [phone]   RxID:   [id number]   Prevention & Chronic Care Immunizations   Influenza vaccine: Not documented   Influenza vaccine deferral: Not indicated  (01/04/2010)    Tetanus  booster: Not documented    Pneumococcal vaccine: Not documented  Colorectal Screening   Hemoccult: Not documented    Colonoscopy: Not documented  Other Screening   Pap smear: Not documented   Pap smear action/deferral: Not indicated S/P hysterectomy  (01/04/2010)    Mammogram: ASSESSMENT: Negative - BI-RADS 1^MM DIGITAL SCREENING  (12/30/2009)   Mammogram due: 12/30/2010  Reports requested:  Smoking status: never  (05/21/2008)  Diabetes Mellitus   HgbA1C: 7.5  (11/02/2008)   HgbA1C action/deferral: Ordered  (01/04/2010)    Eye exam: Not documented   Last eye exam report requested.    Foot exam: yes  (02/11/2008)   Foot exam action/deferral: Refused   High risk foot: Not documented   Foot care education: Not documented    Urine microalbumin/creatinine ratio: 4.2  (11/02/2008)   Urine microalbumin action/deferral: Ordered    Diabetes flowsheet reviewed?: Yes   Progress toward A1C goal: Unchanged  Lipids   Total Cholesterol: 176  (11/02/2008)   Lipid panel action/deferral: Lipid Panel ordered   LDL: 82  (11/02/2008)   LDL Direct: Not documented   HDL: 62  (11/02/2008)   Triglycerides: 159  (11/02/2008)    SGOT (AST): 19  (11/02/2008)   SGPT (ALT): 20  (11/02/2008) CMP ordered    Alkaline phosphatase: 69  (11/02/2008)   Total bilirubin: 0.4  (11/02/2008)    Lipid flowsheet reviewed?: Yes   Progress toward LDL goal: At goal  Hypertension   Last Blood Pressure: 134 / 85  (01/04/2010)   Serum creatinine: 0.75  (11/02/2008)   Serum potassium 4.7  (11/02/2008) CMP ordered     Hypertension flowsheet reviewed?: Yes   Progress toward BP goal: At goal  Self-Management Support :   Personal Goals (by the next clinic visit) :     Personal A1C goal: 7  (01/04/2010)     Personal blood pressure goal: 140/90  (01/04/2010)     Personal LDL goal: 100  (01/04/2010)    Patient will work on the following items until the next clinic visit to reach self-care goals:      Medications and monitoring: take my medicines every day, check my blood sugar  (01/04/2010)    Diabetes self-management support: Written self-care plan  (01/04/2010)   Diabetes care plan printed    Hypertension self-management support: Written self-care plan  (01/04/2010)   Hypertension self-care plan printed.    Lipid self-management support: Written self-care plan  (01/04/2010)   Lipid self-care plan printed.   Nursing Instructions: HgbA1C today (see order) Request report of last diabetic eye exam    Process Orders Check Orders Results:     [facility]: ABN not required for this insurance Tests Sent for requisitioning (January 04, 2010 8:27 PM):     01/04/2010: [facility] -- T-Urine Microalbumin w/creat. ratio [[id number]] (signed)     01/04/2010: [facility] -- T-Lipid Profile [phone] (signed)     01/04/2010: [facility] -- T-Comprehensive Metabolic Panel [phone] (signed)

## 2011-01-10 NOTE — Progress Notes (Signed)
Summary: Refill/gh  Phone Note Refill Request Message from:  Fax from Pharmacy on August 25, 2010 12:19 PM  Refills Requested: Medication #1:  LISINOPRIL 10 MG  TABS Take 1 tablet by mouth once a day   Dosage confirmed as above?Dosage Confirmed  Method Requested: Electronic Initial call taken by: Angelina Ok RN,  August 25, 2010 12:19 PM  Follow-up for Phone Call        Rx denied because it was refill in 05/2010 (3 months supply for 3 refill) and too soon to refill. Follow-up by: Jackson Latino MD,  August 25, 2010 3:58 PM     Appended Document: Refill/gh Call to pharmacy.  Pt had called in an old number. Angelina Ok, RN September 16,2011 11:34 AM  Appended Document: Refill/gh They alreday have it and no need for refill.

## 2011-01-10 NOTE — Assessment & Plan Note (Signed)
Summary: ACUTE-COUGH X 2-3 DAYS/HA'S(YANG)/CFB   Vital Signs:  Patient profile:   60 year old female Height:      63 inches (160.02 cm) Weight:      174.7 pounds (79.41 kg) O2 Sat:      96 % on Room air Temp:     100.0 degrees F (37.78 degrees C) oral Pulse rate:   106 / minute BP sitting:   115 / 67  Vitals Entered By: Krystal Eaton Duncan Dull) (January 24, 2010 1:35 PM)  O2 Flow:  Room air CC: pt c/o cough/congestion, Headache sx's started Sat am Is Patient Diabetic? Yes Did you bring your meter with you today? Yes Pain Assessment Patient in pain? yes     Location: headache Intensity: 7 Type: sharp Onset of pain  Intermittent 2-3 days Nutritional Status BMI of > 30 = obese  Have you ever been in a relationship where you felt threatened, hurt or afraid?No   Does patient need assistance? Functional Status Self care Ambulation Normal   Immunization History:  Influenza Immunization History:    Influenza:  historical (11/10/2009)   Primary Care Provider:  Jackson Latino MD  CC:  pt c/o cough/congestion and Headache sx's started Sat am.  History of Present Illness: Pt is a 60 yo AAF with PMH of DM, HTN and HLD who presents to clinic for:  1) Headache - began Saturday morning. Located in left temporal region. Throbbing in nature, intermittent, taking Ibuprofen 800 mg but stopped taking it due fear of developing GI bleed, but reports it was helping pain.   2) Cough - dry, non-productive. Began over the weekend. Subjective fevers and chills. No runny nose. No shortness of breathing, wheezing. No sore throat or chest pain.   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 Eyes:  Denies eye irritation. ENT:  Complains of nasal congestion; denies difficulty swallowing, ear discharge, earache, sinus pressure, and sore throat. CV:  Denies chest pain or discomfort, difficulty breathing at night, and difficulty breathing while lying down. Resp:  Complains of cough; denies chest discomfort, chest pain with inspiration, shortness of breath, sputum productive, and wheezing. GI:  Denies abdominal pain, change in bowel habits, constipation, diarrhea, nausea, and vomiting. GU:  Denies discharge, dysuria, urinary frequency, and urinary hesitancy. MS:  Denies muscle aches.  Physical Exam  General:  alert and well-developed.   Head:  normocephalic and atraumatic.   Eyes:  vision grossly intact, pupils equal, pupils round, and pupils reactive to light.   Ears:  R ear normal and L ear normal.   Nose:  nasal turbinates inflamed  Mouth:  pharynx pink and moist, no erythema, and no exudates.   Neck:  supple, full ROM, and no masses.   Lungs:  normal respiratory effort, no accessory muscle use, normal breath sounds, no dullness, and no fremitus.   Heart:  regular rhythm, no murmur, no gallop, no rub, and tachycardia.   Abdomen:  soft, non-tender, normal bowel  sounds, no distention, and no masses.   Msk:  normal ROM and no joint tenderness.   Pulses:  R radial normal and L radial normal.   Extremities:  no edema noted    Impression & Recommendations:  Problem # 1:  URI (ICD-465.9) Assessment New Symptoms consistent with Viral URI. Duration 2 days. Advised patient to drink plenty of fluids, and bedrest. Advised patient that if she starts to spike fevers, or cough is productive to  contact clinic. Advised Tyelenol for headache.   Her updated medication list for this problem includes:    Ibu 800 Mg Tabs (Ibuprofen) .Marland Kitchen... Take one tab by mouth with food once every 8 hours for one week, then once every 8 hours with food as needed for pain    Cheratussin Ac 100-10 Mg/76ml Syrp (Guaifenesin-codeine) .Marland Kitchen... Take 1-2 tsp at bedtime as needed for cough.  Problem # 2:  HYPERTENSION (ICD-401.9) Blood pressure is under good control with current regimen, will continue.   Her updated medication list for this problem includes:    Lisinopril 10 Mg Tabs (Lisinopril) .Marland Kitchen... Take 1 tablet by mouth once a day    Furosemide 20 Mg Tabs (Furosemide) .Marland Kitchen... Take 1 tablet by mouth two times a day  BP today: 115/67 Prior BP: 134/85 (01/04/2010)  Prior 10 Yr Risk Heart Disease: 15 % (05/07/2007)  Labs Reviewed: K+: 4.6 (01/05/2010) Creat: : 0.75 (01/05/2010)   Chol: 181 (01/05/2010)   HDL: 53 (01/05/2010)   LDL: 99 (01/05/2010)   TG: 143 (01/05/2010)  Complete Medication List: 1)  Metformin Hcl 1000 Mg Tabs (Metformin hcl) .... Take 1 tablet by mouth two times a day 2)  Glipizide 5 Mg Tabs (Glipizide) .... Take 1 tablet by mouth two times a day 3)  Pravastatin Sodium 20 Mg Tabs (Pravastatin sodium) .... Take 1 tablet by mouth once a day 4)  Accu-chek Softclix Lancets Misc (Lancets) .... To check blood sugar 2x/day- 5)  Lisinopril 10 Mg Tabs (Lisinopril) .... Take 1 tablet by mouth once a day 6)  Flexeril 5 Mg Tabs (Cyclobenzaprine hcl) .... Take one tab by mouth once every 8 hours as needed for pain 7)  Ibu 800 Mg Tabs (Ibuprofen) .... Take one tab by mouth with food once every 8 hours for one week, then once every 8 hours with food as needed for pain 8)  Furosemide 20 Mg Tabs (Furosemide) .... Take 1 tablet by mouth two times a day 9)  Truetrack Blood Glucose Devi (Blood glucose monitoring suppl) 10)  Truetrack Test Strp (Glucose blood) .... Give 3 months supply each time and soft click  lancets to test two times a day 11)  Cheratussin Ac 100-10 Mg/17ml Syrp (Guaifenesin-codeine) .... Take 1-2 tsp at bedtime as needed for cough.  Patient Instructions: 1)  Please schedule a follow-up appointment in 3 months. 2)  Please take medication for cough as needed. 3)  Please drink lots of fluids and bedrest.  4)  Please take tyelenol for fever and headache.  Prescriptions: CHERATUSSIN AC 100-10 MG/5ML SYRP (GUAIFENESIN-CODEINE) Take 1-2 tsp at bedtime as needed for cough.  #1 bottle x 0   Entered and Authorized by:   Melida Quitter MD   Signed by:   Melida Quitter MD on 01/24/2010   Method used:   Print then Give to Patient   RxID:   [id number]    Prevention & Chronic Care Immunizations   Influenza vaccine: Historical  (11/10/2009)   Influenza vaccine deferral: Not indicated  (01/04/2010)  Tetanus booster: Not documented    Pneumococcal vaccine: Not documented  Colorectal Screening   Hemoccult: Not documented    Colonoscopy: Not documented  Other Screening   Pap smear: Not documented   Pap smear action/deferral: Not indicated S/P hysterectomy  (01/04/2010)    Mammogram: ASSESSMENT: Negative - BI-RADS 1^MM DIGITAL SCREENING  (12/30/2009)   Mammogram due: 12/30/2010   Smoking status: never  (05/21/2008)  Diabetes Mellitus   HgbA1C: 9.6  (01/05/2010)   HgbA1C action/deferral: Ordered  (01/04/2010)    Eye exam: Not documented    Foot exam: yes  (02/11/2008)   Foot exam action/deferral: Refused   High risk foot: Not documented   Foot care education: Not documented    Urine microalbumin/creatinine ratio: 4.5  (01/05/2010)   Urine microalbumin action/deferral: Ordered    Diabetes flowsheet reviewed?: Yes   Progress toward A1C goal: Deteriorated  Lipids   Total Cholesterol: 181  (01/05/2010)   Lipid panel action/deferral: Lipid Panel ordered   LDL: 99  (01/05/2010)   LDL Direct: Not documented   HDL: 53  (01/05/2010)   Triglycerides: 143   (01/05/2010)    SGOT (AST): 18  (01/05/2010)   SGPT (ALT): 21  (01/05/2010)   Alkaline phosphatase: 63  (01/05/2010)   Total bilirubin: 0.5  (01/05/2010)    Lipid flowsheet reviewed?: Yes   Progress toward LDL goal: Deteriorated  Hypertension   Last Blood Pressure: 115 / 67  (01/24/2010)   Serum creatinine: 0.75  (01/05/2010)   Serum potassium 4.6  (01/05/2010)    Hypertension flowsheet reviewed?: Yes   Progress toward BP goal: At goal  Self-Management Support :   Personal Goals (by the next clinic visit) :     Personal A1C goal: 7  (01/24/2010)     Personal blood pressure goal: 130/80  (01/24/2010)     Personal LDL goal: 70  (01/24/2010)    Patient will work on the following items until the next clinic visit to reach self-care goals:     Medications and monitoring: take my medicines every day  (01/24/2010)     Eating: drink diet soda or water instead of juice or soda, eat more vegetables, eat foods that are low in salt  (01/24/2010)     Activity: take a 30 minute walk every day  (01/24/2010)    Diabetes self-management support: Copy of home glucose meter record, Written self-care plan  (01/24/2010)   Diabetes care plan printed    Hypertension self-management support: Copy of home glucose meter record, Written self-care plan  (01/24/2010)   Hypertension self-care plan printed.    Lipid self-management support: Written self-care plan  (01/24/2010)   Lipid self-care plan printed.   Appended Document: ACUTE-COUGH X 2-3 DAYS/HA'S(YANG)/CFB unable to download meter at this visit

## 2011-01-10 NOTE — Progress Notes (Signed)
Summary: Refill/gh  Phone Note Refill Request Message from:  Fax from Pharmacy on June 07, 2010 12:21 PM  Refills Requested: Medication #1:  LISINOPRIL 10 MG  TABS Take 1 tablet by mouth once a day   Dosage confirmed as above?Dosage Confirmed   Last Refilled: 03/11/2010  Method Requested: Electronic Initial call taken by: Angelina Ok RN,  June 07, 2010 12:21 PM  Follow-up for Phone Call        Rx completed in Dr. Tiajuana Amass Follow-up by: Jackson Latino MD,  June 08, 2010 11:08 AM    Prescriptions: LISINOPRIL 10 MG  TABS (LISINOPRIL) Take 1 tablet by mouth once a day  #93 Tablet x 3   Entered and Authorized by:   Jackson Latino MD   Signed by:   Jackson Latino MD on 06/08/2010   Method used:   Electronically to        [facility]* (retail)       [address]       [address] Shipping/mailing       [place], [place]  [address]       Ph: [phone]       Fax: [phone]   RxID:   [id number]

## 2011-01-10 NOTE — Progress Notes (Signed)
Summary: REFILL/GG  Phone Note Refill Request  on January 13, 2010 8:51 AM  Refills Requested: Medication #1:  TRUETRACK BLOOD GLUCOSE  DEVI. ***Need Rx for glucometer with test strips and lancets.  They have truetrack at the pharmacy.  I entered the device but you need to change teststrips to truetrack.  thanks   **PLEASE CHANGE AUTHORIZED  BY:    TO YOUR NAME.   Method Requested: Electronic Initial call taken by: Merrie Roof RN,  January 13, 2010 8:53 AM  Follow-up for Phone Call        Rx completed in Dr. Tiajuana Amass Follow-up by: Jackson Latino MD,  January 13, 2010 1:46 PM    New/Updated Medications: TRUETRACK BLOOD GLUCOSE  DEVI (BLOOD GLUCOSE MONITORING SUPPL)  TRUETRACK TEST  STRP (GLUCOSE BLOOD) Give 3 months supply each time and soft click lancets to test two times a day Prescriptions: ACCU-CHEK SOFTCLIX LANCETS   MISC (LANCETS) to check blood sugar 2x/day- Brand medically necessary #200 x 12   Entered by:   Jackson Latino MD   Authorized by:   Merrie Roof RN   Signed by:   Jackson Latino MD on 01/13/2010   Method used:   Faxed to ...       [facility]* (retail)       [address]       [address] Shipping/mailing       [place], [place]  [address]       Ph: [phone]       Fax: [phone]   RxID:   [id number] WNIOEVOJJ TEST  STRP (GLUCOSE BLOOD) Give 3 months supply each time and soft click lancets to test two times a day  #3 month x 4   Entered by:   Jackson Latino MD   Authorized by:   Merrie Roof RN   Signed by:   Jackson Latino MD on 01/13/2010   Method used:   Faxed to ...       [facility]* (retail)       [address]       [address] [place] [address]       [place], [place]  [address]       Ph: [phone]       Fax: [phone]   RxID:   [id number] TIRWERXVQ BLOOD GLUCOSE  DEVI (BLOOD GLUCOSE MONITORING SUPPL)   #1 x 0   Entered and Authorized by:   Merrie Roof RN   Signed  by:   Jackson Latino MD on 01/13/2010   Method used:   Faxed to ...       [facility]* (retail)       [address]       [address] Shipping/mailing       [place], [place]  [address]       Ph: [phone]       Fax: [phone]   RxID:   [id number]

## 2011-01-10 NOTE — Assessment & Plan Note (Signed)
Summary: diabetes/dmr   Vital Signs:  Patient profile:   60 year old female Weight:      173.6 pounds BMI:     30.86 CBG Result 321 Comments this CBG is fasting after pills with water this morning. She forgot second dose of diabetes pills last night.   Allergies: No Known Drug Allergies   Complete Medication List: 1)  Metformin Hcl 1000 Mg Tabs (Metformin hcl) .... Take 1 tablet by mouth two times a day 2)  Glipizide 10 Mg Tabs (Glipizide) .... Take 1 tablet by mouth two times a day 3)  Pravastatin Sodium 40 Mg Tabs (Pravastatin sodium) .... Take 1 tablet by mouth once a day 4)  Accu-chek Softclix Lancets Misc (Lancets) .... To check blood sugar 2x/day- 5)  Lisinopril 10 Mg Tabs (Lisinopril) .... Take 1 tablet by mouth once a day 6)  Flexeril 5 Mg Tabs (Cyclobenzaprine hcl) .... Take one tab by mouth once every 8 hours as needed for pain 7)  Ibu 800 Mg Tabs (Ibuprofen) .... Take one tab by mouth with food once every 8 hours for one week, then once every 8 hours with food as needed for pain 8)  Furosemide 20 Mg Tabs (Furosemide) .... Take 1 tablet by mouth two times a day 9)  Truetrack Blood Glucose Devi (Blood glucose monitoring suppl) 10)  Truetrack Test Strp (Glucose blood) .... Give 3 months supply each time and soft click lancets to test two times a day  Other Orders: DSMT(Medicare) Individual, 30 Minutes (G0108)  Patient Instructions: 1)  Remember to take your medication with breakfast and with dinner 2)  - ? sit pill bottles on table, ? use calendar,? use pill box, ? 3)  Next apointment is wednesday 8:30, October 5th 4)  Between this visit and next: try to test before nad 2 hours after one breakfast, one lunch and one dinner.  MEDICAL NUTRITION THERAPY  Assessment:Patient here  to improve blood sugar control. She is participating in [employer], has plenty of supplies, has not been self monitoring much, no more than once per week. Reports started walking wiht her  husband one month ago. He is supportive.   Wt: see above- stable Medications:glipizide 10 mg twice daily & metfomrin 100 mg twice dialy- patient often misses one dose of both a day Blood sugars: no meter- has not been self monitoring- "not enough time" 24 hour recall: cereal, skim milk and banana, salads or sandwich for lunch lately, but was eating fried foods, baked sweet potatoes and more vegetables lately, fills half plate with vegetable now. Likes fruit or peanutbutter crackers for snacks, but generally does not snack. Exercise: walking 30-60 minutes 5-6 times a week Labs:A1C 8.3% Estimated needs: ~200 grams  carbs/day  Diagnosis:  NB 1.4 -self-monitoring deificit as related to not self testing blood sugars as evidenced by her report and not having meter with her today.  NB 1.1-Food nad Nutrtion related knowledge defiict as related to not knowing the importance of carbs to blood sugar control as evidenced by patient unable to identlify carbs food groups, portions sizes and how to read food label to predict how food will affect blood sugar.  Intervention:  1- Began education on carb counting  2- Requested extended release diabetes medications from Dr.Yang. 3- Educated about how use information from self monitoring to make changes and know how food is affecting her blood sugar. 4- Educated about how to keep food/CBG diary  Monitoring: food record with carb counts and pre nad  post prandail blood sugars Evaluation: A1C  Follow-up:2 weeks  Appended Document: diabetes/dmr Faxed successfully to [facility]. [phone]

## 2011-01-10 NOTE — Progress Notes (Signed)
Summary: diabetes support/dmr  Phone Note Outgoing Call   Call placed by: Jamison Neighbor RD,CDE,  October 31, 2010 1:26 PM Summary of Call: called to offer continmued DSMT/MNT for high A1C- patient denies need at this time.

## 2011-01-10 NOTE — Consult Note (Signed)
Summary: DR. CHARLIE BYRNES  DR. CHARLIE BYRNES   Imported By: Louretta Parma 09/02/2010 15:07:57  _____________________________________________________________________  External Attachment:    Type:   Image     Comment:   External Document  Appended Document: DR. CHARLIE BYRNES   Diabetic Eye Exam  Procedure date:  08/29/2010  Findings:      No diabetic retinopathy.     Procedures Next Due Date:    Diabetic Eye Exam: 09/2011   Diabetic Eye Exam  Procedure date:  08/29/2010  Findings:      No diabetic retinopathy.     Procedures Next Due Date:    Diabetic Eye Exam: 09/2011

## 2011-01-12 NOTE — Medication Information (Signed)
Summary: OUTPATIENT PHARMACY  OUTPATIENT PHARMACY   Imported By: Margie Billet 12/28/2010 11:00:39  _____________________________________________________________________  External Attachment:    Type:   Image     Comment:   External Document

## 2011-01-12 NOTE — Progress Notes (Signed)
Summary: diab shoes/ hla  Phone Note Call from Patient   Summary of Call: pt calls to say she is calling [facility] to find out the procedure to get diab shoes thru [facility]. ask to call when she gets that info and clinic will be glad to assist her. Initial call taken by: Marin Roberts RN,  November 30, 2010 2:19 PM

## 2011-01-12 NOTE — Letter (Signed)
Summary: [facility] ORDER/SCRIPT FOR DIABETIC SHOES/[facility]  [facility] ORDER/SCRIPT FOR DIABETIC SHOES/[facility]   Imported By: Shon Hough 12/23/2010 14:15:04  _____________________________________________________________________  External Attachment:    Type:   Image     Comment:   External Document

## 2011-01-12 NOTE — Progress Notes (Signed)
Summary: phone/gg  Phone Note Call from Patient   Caller: Patient Summary of Call: Pt called and has the information of the type of diabetic shoes her insrance will cover. You have two choices and guidlines. I have placed the information sheet in your box and pt is asking for a written Rx for the shoes ASAP Pt # [phone] Initial call taken by: Merrie Roof RN,  December 08, 2010 11:06 AM  Follow-up for Phone Call        Wrote prescription and she was informed and she will pick it up. Follow-up by: Jackson Latino MD,  December 21, 2010 7:25 PM

## 2011-01-13 NOTE — Letter (Signed)
Summary: [facility]   Imported By: Margie Billet 05/30/2010 10:15:04  _____________________________________________________________________  External Attachment:    Type:   Image     Comment:   External Document

## 2011-01-17 ENCOUNTER — Other Ambulatory Visit: Payer: Self-pay | Admitting: Internal Medicine

## 2011-01-17 DIAGNOSIS — E785 Hyperlipidemia, unspecified: Secondary | ICD-10-CM

## 2011-02-16 ENCOUNTER — Ambulatory Visit (INDEPENDENT_AMBULATORY_CARE_PROVIDER_SITE_OTHER): Payer: Self-pay | Admitting: Internal Medicine

## 2011-02-16 ENCOUNTER — Encounter: Payer: Self-pay | Admitting: Internal Medicine

## 2011-02-16 VITALS — BP 126/75 | HR 96 | Temp 98.0°F | Ht 63.0 in | Wt 172.3 lb

## 2011-02-16 DIAGNOSIS — E785 Hyperlipidemia, unspecified: Secondary | ICD-10-CM

## 2011-02-16 DIAGNOSIS — E119 Type 2 diabetes mellitus without complications: Secondary | ICD-10-CM

## 2011-02-16 DIAGNOSIS — Q752 Hypertelorism: Secondary | ICD-10-CM

## 2011-02-16 DIAGNOSIS — Q759 Congenital malformation of skull and face bones, unspecified: Secondary | ICD-10-CM

## 2011-02-16 DIAGNOSIS — I10 Essential (primary) hypertension: Secondary | ICD-10-CM

## 2011-02-16 LAB — GLUCOSE, CAPILLARY: Glucose-Capillary: 230 mg/dL — ABNORMAL HIGH (ref 70–99)

## 2011-02-16 MED ORDER — FUROSEMIDE 20 MG PO TABS: 20.0000 mg | ORAL_TABLET | Freq: Two times a day (BID) | ORAL | Status: DC

## 2011-02-16 MED ORDER — PRAVASTATIN SODIUM 40 MG PO TABS: 40.0000 mg | ORAL_TABLET | Freq: Every day | ORAL | Status: DC

## 2011-02-16 MED ORDER — GLIPIZIDE ER 10 MG PO TB24: 10.0000 mg | ORAL_TABLET | Freq: Every day | ORAL | Status: DC

## 2011-02-16 MED ORDER — METFORMIN HCL ER (OSM) 1000 MG PO TB24: 2000.0000 mg | ORAL_TABLET | Freq: Every day | ORAL | Status: DC

## 2011-02-16 MED ORDER — LISINOPRIL 10 MG PO TABS: 10.0000 mg | ORAL_TABLET | Freq: Every day | ORAL | Status: DC

## 2011-02-16 NOTE — Assessment & Plan Note (Signed)
BP well controlled and will continue current meds. Recheck CMET.

## 2011-02-16 NOTE — Patient Instructions (Signed)
Please stop your lasix one week before you collect  your urine test date.  Please check your blood sugar daily and bring your glucometer with you.  Please colonoscopy report with you at next visit.

## 2011-02-16 NOTE — Progress Notes (Signed)
  Subjective:    Patient ID: Michele Andrews, female    DOB: 05/16/1951, 60 y.o.   MRN: [id number]  HPI Pt is 60 yo AAF with PMH of DM, hypercalcemia, HTN and HLD who came here for regular f/u and check for DM. Her CBg has been 180s-230s. Her last A1C 9.2 and today 8.8. She has no c/o, including CP, SOB, HA, fever. She has been taking all her meds as instructed. No melena, appetite change or dysuria.    Review of Systems  Constitutional: Negative for fever, chills and activity change.  HENT: Negative for congestion, sneezing and neck pain.   Eyes: Negative for photophobia, pain and itching.  Respiratory: Negative for cough and wheezing.   Cardiovascular: Negative for chest pain, palpitations and leg swelling.  Gastrointestinal: Negative for vomiting, abdominal pain and diarrhea.  Genitourinary: Negative for dysuria and urgency.  Musculoskeletal: Negative for back pain and joint swelling.  Neurological: Negative for dizziness and headaches.       Objective:   Physical Exam  Constitutional: She is oriented to person, place, and time. She appears well-developed and well-nourished.  HENT:  Head: Normocephalic and atraumatic.  Nose: Nose normal.  Mouth/Throat: Oropharynx is clear and moist. No oropharyngeal exudate.  Eyes: Conjunctivae and EOM are normal. Pupils are equal, round, and reactive to light.  Neck: Normal range of motion. Neck supple. No JVD present. No tracheal deviation present. No thyromegaly present.  Cardiovascular: Normal rate, regular rhythm, normal heart sounds and intact distal pulses.  Exam reveals no friction rub.   No murmur heard. Pulmonary/Chest: Effort normal and breath sounds normal.  Abdominal: Soft. Bowel sounds are normal. She exhibits no distension. There is no tenderness. There is no guarding.  Musculoskeletal: Normal range of motion. She exhibits no edema and no tenderness.  Lymphadenopathy:    She has no cervical adenopathy.  Neurological: She is alert  and oriented to person, place, and time. She has normal reflexes.          Assessment & Plan:

## 2011-02-16 NOTE — Assessment & Plan Note (Addendum)
LDL 99 and Continue pravastatin. Will recheck FLP.

## 2011-02-16 NOTE — Assessment & Plan Note (Signed)
This may be due to primary parahyperthyroidism. Her last 24 urine Ca is done with lasix. So will repeat this as well as PTH and ask her stop lasix for one week before these tests.

## 2011-02-16 NOTE — Assessment & Plan Note (Addendum)
Lab Results  Component Value Date   HGBA1C 8.8 02/16/2011   HGBA1C 9.2 10/31/2010   CREATININE 0.75 01/05/2010   MICROALBUR 0.50 01/05/2010   MICRALBCREAT 4.5 01/05/2010   CHOL 181 01/05/2010   LDL 99 01/05/2010   HDL 53 1/61/0960   TRIG 143 01/05/2010    Last eye exam and foot exam:    Component Value Date/Time   HMDIABEYEEXA no diabetic retinopathy 08/29/2010   HMDIABFOOTEX done 03/30/2010    Her CBg has been 180s-230s. Her last A1C 9.2 and today 8.8. I discussed the manement option including increase her metformin or start lantus now. She would like to try one more month for metformin at increased dose and glipizide. So I will increase metformin to 2000 mg qd today. If her CBG no change in a month, will start lantus 10 units qd and stop glipizide. Will check urine alb/Cr ration.

## 2011-02-21 LAB — GLUCOSE, CAPILLARY: Glucose-Capillary: 189 mg/dL — ABNORMAL HIGH (ref 70–99)

## 2011-02-23 LAB — GLUCOSE, CAPILLARY: Glucose-Capillary: 108 mg/dL — ABNORMAL HIGH (ref 70–99)

## 2011-02-27 ENCOUNTER — Telehealth: Payer: Self-pay | Admitting: Internal Medicine

## 2011-02-27 DIAGNOSIS — E119 Type 2 diabetes mellitus without complications: Secondary | ICD-10-CM

## 2011-02-27 MED ORDER — INSULIN GLARGINE 100 UNIT/ML ~~LOC~~ SOLN
10.0000 [IU] | Freq: Every day | SUBCUTANEOUS | Status: DC
Start: 2011-02-27 — End: 2011-05-25

## 2011-02-27 NOTE — Telephone Encounter (Signed)
Will start lantus at 10units.

## 2011-02-27 NOTE — Assessment & Plan Note (Signed)
Her CBG still not controled on metformin 1000mg  bid, ranging 200s -300s. So will start lantus 10 units and stop glipizide and will adjust dose at next visit.

## 2011-03-21 ENCOUNTER — Other Ambulatory Visit: Payer: Self-pay | Admitting: Internal Medicine

## 2011-03-21 ENCOUNTER — Ambulatory Visit: Payer: Self-pay

## 2011-03-21 LAB — LIPID PANEL
Total CHOL/HDL Ratio: 3.3 Ratio
VLDL: 46 mg/dL — ABNORMAL HIGH (ref 0–40)

## 2011-03-21 LAB — MICROALBUMIN / CREATININE URINE RATIO
Microalb Creat Ratio: 4 mg/g (ref 0.0–30.0)
Microalb, Ur: 0.5 mg/dL (ref 0.00–1.89)

## 2011-03-21 LAB — COMPREHENSIVE METABOLIC PANEL
AST: 20 U/L (ref 0–37)
BUN: 9 mg/dL (ref 6–23)
Calcium: 10.5 mg/dL (ref 8.4–10.5)
Chloride: 103 mEq/L (ref 96–112)
Creat: 0.6 mg/dL (ref 0.40–1.20)
Glucose, Bld: 201 mg/dL — ABNORMAL HIGH (ref 70–99)

## 2011-03-21 NOTE — Progress Notes (Signed)
  Subjective:    Patient ID: Michele Andrews, female    DOB: 01/22/1951, 60 y.o.   MRN: [id number]  HPI    Review of Systems     Objective:   Physical Exam        Assessment & Plan:

## 2011-03-22 LAB — PTH, INTACT AND CALCIUM: PTH: 81.2 pg/mL — ABNORMAL HIGH (ref 14.0–72.0)

## 2011-03-23 LAB — CALCIUM, URINE, 24 HOUR: Calcium, 24 hour urine: 312 mg/d — ABNORMAL HIGH (ref 100–250)

## 2011-03-23 LAB — CREATININE CLEARANCE, URINE, 24 HOUR: Creatinine Clearance: 148 mL/min — ABNORMAL HIGH (ref 75–115)

## 2011-04-16 IMAGING — MG MM DIGITAL SCREENING
5 series · 5 of 5 positions shown · non-contrast
Comparison: none

DG SCREEN MAMMOGRAM BILATERAL
Bilateral CC and MLO view(s) were taken.
Technologist: Carberry, Akilah.(NIKAM)(M)

DIGITAL SCREENING MAMMOGRAM WITH CAD:
There are scattered fibroglandular densities.  No masses or malignant type calcifications are 
identified.  Compared with prior studies.
Images were processed with CAD.

[R CC]
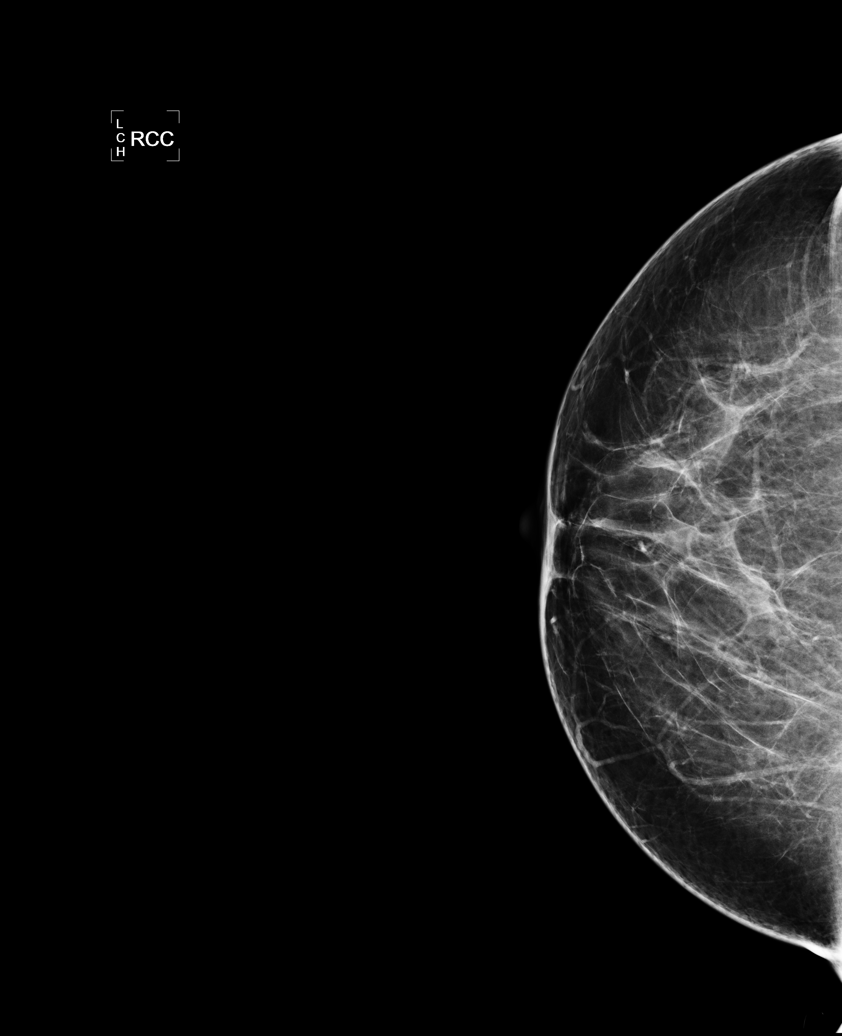

[R MLO]
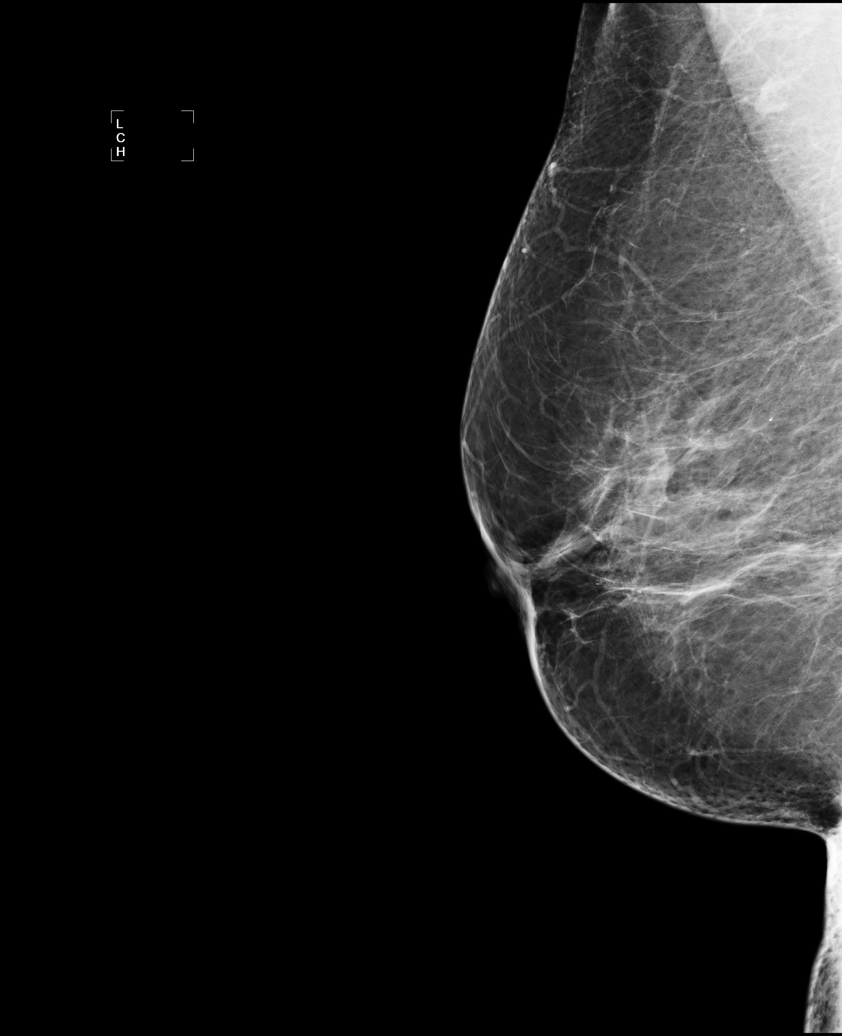

[L CC]
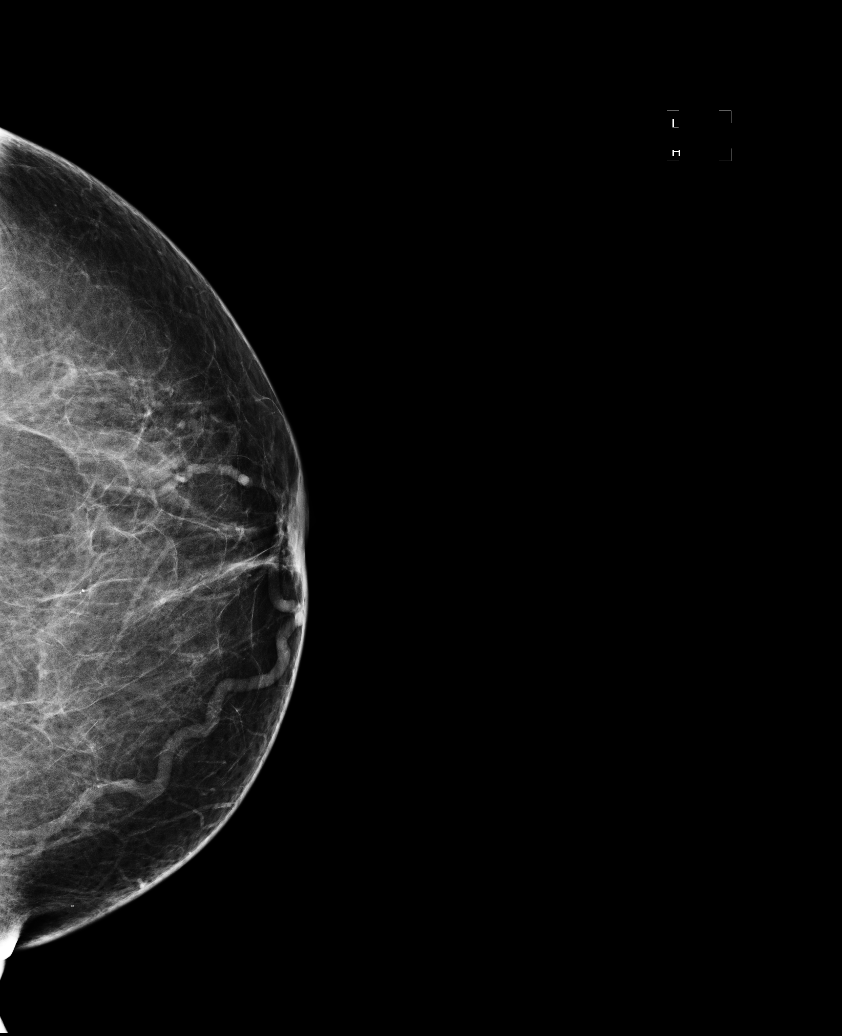

[L MLO (1 of 2)]
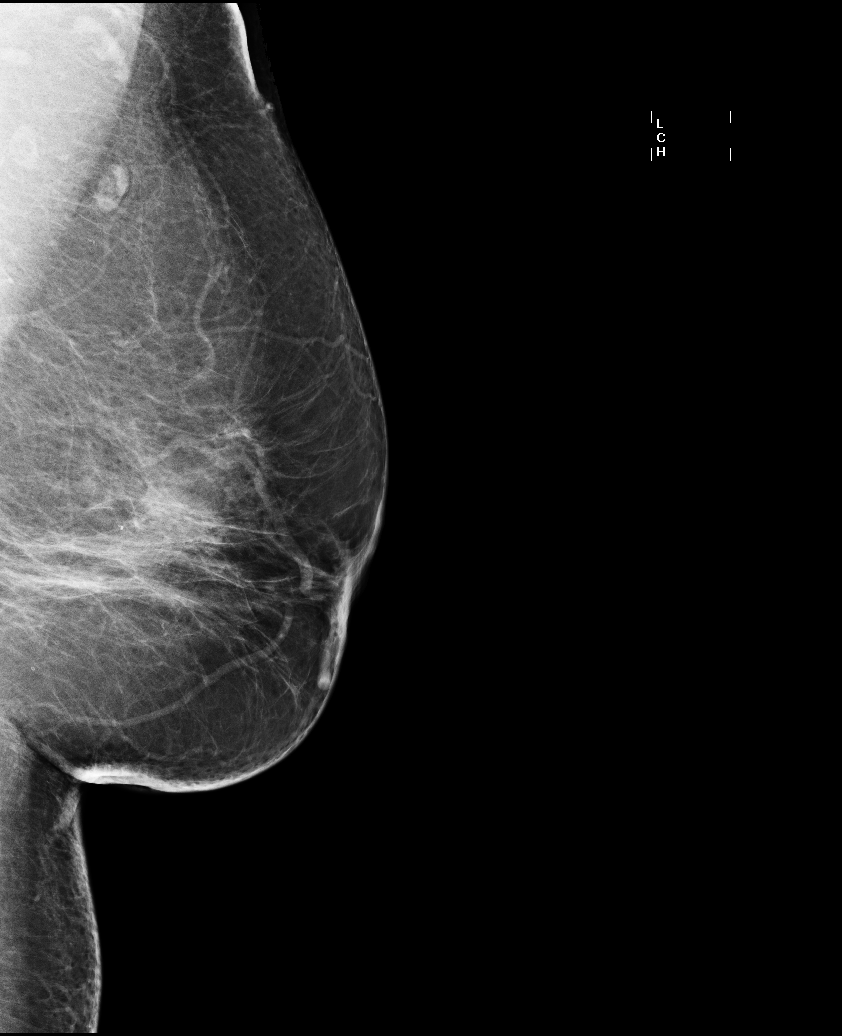

[L MLO (2 of 2)]
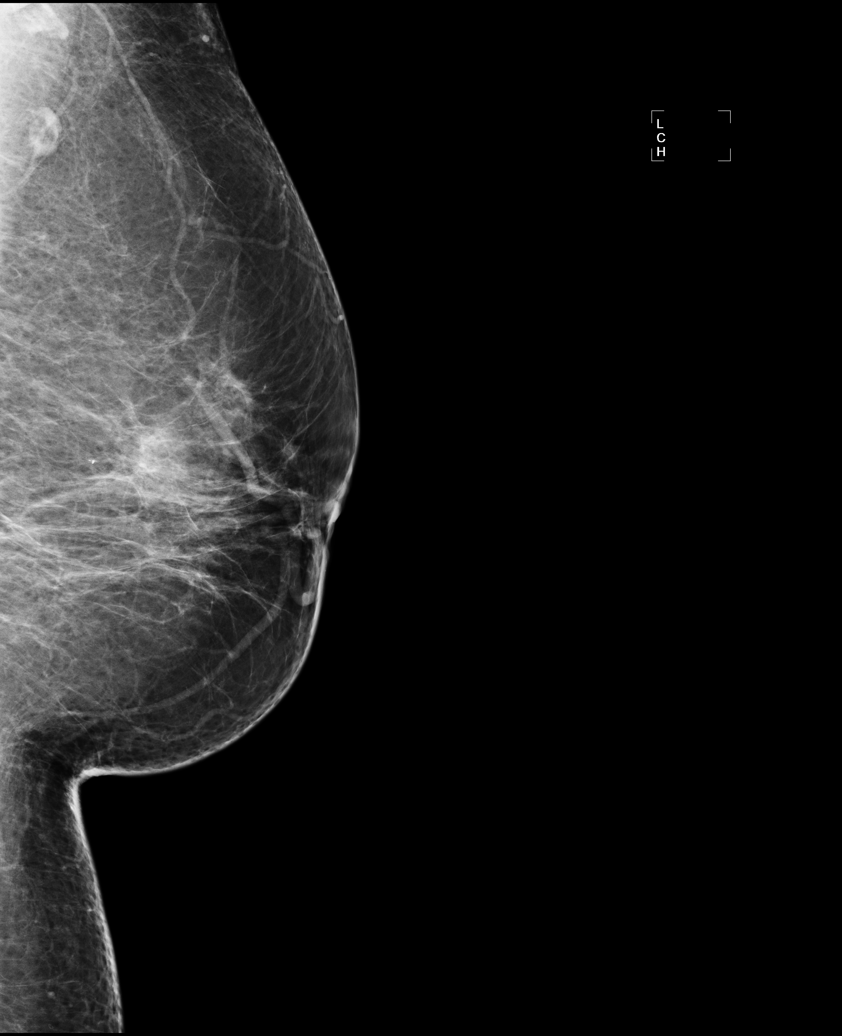

[5 of 5 positions shown; findings below may reference images not displayed]

IMPRESSION: No specific mammographic evidence of malignancy.  Next screening mammogram is recommended in one 
year.

A result letter of this screening mammogram will be mailed directly to the patient.

ASSESSMENT: Negative - BI-RADS 1

Screening mammogram in 1 year.
,

## 2011-05-02 ENCOUNTER — Encounter: Payer: Self-pay | Admitting: Internal Medicine

## 2011-05-05 ENCOUNTER — Other Ambulatory Visit: Payer: Self-pay | Admitting: *Deleted

## 2011-05-05 NOTE — Telephone Encounter (Signed)
Is pt to continue this?

## 2011-05-05 NOTE — Telephone Encounter (Signed)
She is off glipizide and we added lantus, so will not refill glipizide.

## 2011-05-25 ENCOUNTER — Other Ambulatory Visit: Payer: Self-pay | Admitting: Internal Medicine

## 2011-05-25 DIAGNOSIS — E119 Type 2 diabetes mellitus without complications: Secondary | ICD-10-CM

## 2011-05-25 MED ORDER — INSULIN GLARGINE 100 UNIT/ML ~~LOC~~ SOLN: 28.0000 [IU] | Freq: Every day | SUBCUTANEOUS | Status: DC

## 2011-06-20 ENCOUNTER — Other Ambulatory Visit: Payer: Self-pay | Admitting: Internal Medicine

## 2011-06-20 DIAGNOSIS — E119 Type 2 diabetes mellitus without complications: Secondary | ICD-10-CM

## 2011-06-20 MED ORDER — INSULIN GLARGINE 100 UNIT/ML ~~LOC~~ SOLN: 28.0000 [IU] | Freq: Every day | SUBCUTANEOUS | Status: DC

## 2011-07-05 ENCOUNTER — Other Ambulatory Visit: Payer: Self-pay | Admitting: Internal Medicine

## 2011-08-04 ENCOUNTER — Other Ambulatory Visit: Payer: Self-pay | Admitting: Internal Medicine

## 2011-08-23 ENCOUNTER — Other Ambulatory Visit: Payer: Self-pay | Admitting: Internal Medicine

## 2011-08-25 ENCOUNTER — Other Ambulatory Visit: Payer: Self-pay | Admitting: Internal Medicine

## 2011-10-23 ENCOUNTER — Other Ambulatory Visit: Payer: Self-pay | Admitting: *Deleted

## 2011-10-23 MED ORDER — IBUPROFEN 800 MG PO TABS: 800.0000 mg | ORAL_TABLET | Freq: Three times a day (TID) | ORAL | Status: DC

## 2011-12-15 ENCOUNTER — Other Ambulatory Visit: Payer: Self-pay | Admitting: Infectious Diseases

## 2011-12-15 DIAGNOSIS — Z1231 Encounter for screening mammogram for malignant neoplasm of breast: Secondary | ICD-10-CM

## 2011-12-18 ENCOUNTER — Other Ambulatory Visit: Payer: Self-pay | Admitting: *Deleted

## 2011-12-18 DIAGNOSIS — Q752 Hypertelorism: Secondary | ICD-10-CM

## 2011-12-28 ENCOUNTER — Telehealth: Payer: Self-pay | Admitting: *Deleted

## 2011-12-28 DIAGNOSIS — Q752 Hypertelorism: Secondary | ICD-10-CM

## 2011-12-28 MED ORDER — FUROSEMIDE 20 MG PO TABS: 20.0000 mg | ORAL_TABLET | Freq: Two times a day (BID) | ORAL | Status: DC

## 2011-12-28 NOTE — Telephone Encounter (Signed)
Pt calls and states she needs her lasix filled, states she realizes that it has been "a little while" since she was seen but her appt is made and her BP is in the 140's/upper 80's area and she needs the lasix since when she takes it her BP is well controlled. After reviewing, last labs 03/2011, last visit 03/2011. Pt is informed that she will need labs and an appt, will make appt next week for labs and with md other than pcp,  Please place lab orders and i will call w/ lab and visit appts.

## 2012-01-02 ENCOUNTER — Other Ambulatory Visit (INDEPENDENT_AMBULATORY_CARE_PROVIDER_SITE_OTHER): Payer: 59

## 2012-01-02 DIAGNOSIS — I10 Essential (primary) hypertension: Secondary | ICD-10-CM

## 2012-01-02 DIAGNOSIS — E785 Hyperlipidemia, unspecified: Secondary | ICD-10-CM

## 2012-01-02 LAB — COMPLETE METABOLIC PANEL WITH GFR
ALT: 17 U/L (ref 0–35)
Albumin: 4.5 g/dL (ref 3.5–5.2)
CO2: 27 mEq/L (ref 19–32)
Calcium: 10.8 mg/dL — ABNORMAL HIGH (ref 8.4–10.5)
Chloride: 107 mEq/L (ref 96–112)
Creat: 0.66 mg/dL (ref 0.50–1.10)
GFR, Est African American: >89 mL/min
Potassium: 4.2 mEq/L (ref 3.5–5.3)

## 2012-01-02 LAB — LIPID PANEL
LDL Cholesterol: 89 mg/dL (ref 0–99)
Triglycerides: 108 mg/dL (ref <150)

## 2012-01-05 ENCOUNTER — Ambulatory Visit (INDEPENDENT_AMBULATORY_CARE_PROVIDER_SITE_OTHER): Payer: 59 | Admitting: Internal Medicine

## 2012-01-05 VITALS — BP 129/82 | HR 102 | Temp 97.3°F | Ht 63.0 in | Wt 183.2 lb

## 2012-01-05 DIAGNOSIS — Z23 Encounter for immunization: Secondary | ICD-10-CM | POA: Insufficient documentation

## 2012-01-05 DIAGNOSIS — Z299 Encounter for prophylactic measures, unspecified: Secondary | ICD-10-CM

## 2012-01-05 DIAGNOSIS — E119 Type 2 diabetes mellitus without complications: Secondary | ICD-10-CM

## 2012-01-05 DIAGNOSIS — I498 Other specified cardiac arrhythmias: Secondary | ICD-10-CM

## 2012-01-05 DIAGNOSIS — G5600 Carpal tunnel syndrome, unspecified upper limb: Secondary | ICD-10-CM

## 2012-01-05 DIAGNOSIS — E785 Hyperlipidemia, unspecified: Secondary | ICD-10-CM

## 2012-01-05 DIAGNOSIS — R000 Tachycardia, unspecified: Secondary | ICD-10-CM | POA: Insufficient documentation

## 2012-01-05 DIAGNOSIS — Q752 Hypertelorism: Secondary | ICD-10-CM

## 2012-01-05 DIAGNOSIS — I10 Essential (primary) hypertension: Secondary | ICD-10-CM

## 2012-01-05 LAB — POCT GLYCOSYLATED HEMOGLOBIN (HGB A1C): Hemoglobin A1C: 7.5

## 2012-01-05 LAB — GLUCOSE, CAPILLARY: Glucose-Capillary: 128 mg/dL — ABNORMAL HIGH (ref 70–99)

## 2012-01-05 MED ORDER — GLIPIZIDE ER 10 MG PO TB24: 10.0000 mg | ORAL_TABLET | Freq: Every day | ORAL | Status: DC

## 2012-01-05 MED ORDER — METFORMIN HCL 1000 MG PO TABS: 1000.0000 mg | ORAL_TABLET | Freq: Two times a day (BID) | ORAL | Status: DC

## 2012-01-05 MED ORDER — FUROSEMIDE 20 MG PO TABS: 20.0000 mg | ORAL_TABLET | Freq: Two times a day (BID) | ORAL | Status: DC

## 2012-01-05 MED ORDER — PRAVASTATIN SODIUM 40 MG PO TABS: 40.0000 mg | ORAL_TABLET | Freq: Every day | ORAL | Status: DC

## 2012-01-05 MED ORDER — IBUPROFEN 800 MG PO TABS: 800.0000 mg | ORAL_TABLET | Freq: Three times a day (TID) | ORAL | Status: DC

## 2012-01-05 MED ORDER — LISINOPRIL 10 MG PO TABS: 10.0000 mg | ORAL_TABLET | Freq: Every day | ORAL | Status: DC

## 2012-01-05 NOTE — Progress Notes (Signed)
Subjective:   Patient ID: Michele Andrews female   DOB: January 25, 1951 61 y.o.   MRN: [id number]  HPI: Michele Andrews is a 61 y.o. female with past medical history significant as outlined below who presented to the clinic for regular office visit. Patient was last seen in March of 2012 and after that did not followup. Patient reports she has been doing overall and is here for her medication refills.     Past Medical History  Diagnosis Date  . Hypertension   . Diabetes mellitus   . Hypercalcemia   . Hyperlipidemia   . Carpal tunnel syndrome   . Rotator cuff syndrome of right shoulder   . Osteopenia    Current Outpatient Prescriptions  Medication Sig Dispense Refill  . Blood Glucose Monitoring Suppl (TRUETRACK BLOOD GLUCOSE) W/DEVICE KIT Use to check blood sugar 2 times a day       . furosemide (LASIX) 20 MG tablet Take 1 tablet (20 mg total) by mouth 2 (two) times daily.  60 tablet  4  . glipiZIDE (GLUCOTROL XL) 10 MG 24 hr tablet TAKE 1 TABLET BY MOUTH ONCE A DAY  30 tablet  PRN  . glucose blood (TRUETRACK TEST) test strip Use to check blood sugar 2 times a day       . ibuprofen (ADVIL,MOTRIN) 800 MG tablet Take 1 tablet (800 mg total) by mouth every 8 (eight) hours. With food, as needed for pain  30 tablet  2  . insulin glargine (LANTUS) 100 UNIT/ML injection Inject 30 Units into the skin at bedtime.      . Lancets (ACCU-CHEK SOFT TOUCH) lancets Use to check blood sugar 2 times a day       . lisinopril (PRINIVIL,ZESTRIL) 10 MG tablet Take 1 tablet (10 mg total) by mouth daily.  30 tablet  5  . metFORMIN (GLUCOPHAGE) 1000 MG tablet TAKE 1 TABLET BY MOUTH TWO TIMES A DAY  186 tablet  12  . pravastatin (PRAVACHOL) 40 MG tablet Take 1 tablet (40 mg total) by mouth daily.  30 tablet  5  . DISCONTD: insulin glargine (LANTUS SOLOSTAR) 100 UNIT/ML injection Inject 28 Units into the skin at bedtime.  15 mL  12   Family History  Problem Relation Age of Onset  . Hypertension Mother   .  Cancer Mother    History   Social History  . Marital Status: Married    Spouse Name: N/A    Number of Children: N/A  . Years of Education: N/A   Social History Main Topics  . Smoking status: Never Smoker   . Smokeless tobacco: Not on file  . Alcohol Use: No  . Drug Use: No  . Sexually Active: Not on file   Other Topics Concern  . Not on file   Social History Narrative  . No narrative on file   Review of Systems: Constitutional: Denies fever, chills, diaphoresis, appetite change and fatigue.   Respiratory: Denies SOB, DOE, cough, chest tightness,  and wheezing.   Cardiovascular: Denies chest pain, palpitations and leg swelling.  Gastrointestinal: Denies nausea, vomiting, abdominal pain, diarrhea, constipation, blood in stool and abdominal distention.  Genitourinary: Denies dysuria, urgency, frequency, hematuria, flank pain and difficulty urinating.  Skin: Denies pallor, rash and wound.  Neurological: Denies dizziness,  syncope, weakness, light-headedness, numbness and headaches.    Objective:  Physical Exam: Filed Vitals:   01/05/12 1035  BP: 129/82  Pulse: 102  Temp: 97.3 F (36.3 C)  TempSrc: Oral  Height: 5\' 3"  (1.6 m)  Weight: 183 lb 3.2 oz (83.099 kg)  SpO2: 98%   Constitutional: Vital signs reviewed.  Patient is a well-developed and well-nourished  in no acute distress and cooperative with exam. Alert and oriented x3.  Eyes: PERRL, EOMI, conjunctivae normal, No scleral icterus.  Neck: Supple Cardiovascular: RRR, S1 normal, S2 normal,pulses symmetric and intact bilaterally Pulmonary/Chest: CTAB, no wheezes, rales, or rhonchi Abdominal: Soft. Non-tender, non-distended, bowel sounds are normal, .  GU: no CVA tenderness Musculoskeletal: No joint deformities, erythema, or stiffness, ROM full and no nontender Hematology: no cervical, Neurological: A&O x3,  no focal motor deficit, sensory intact to light touch bilaterally.  Skin: Warm, dry and intact. No rash,  cyanosis, or clubbing.

## 2012-01-05 NOTE — Patient Instructions (Signed)
Please check your blood glucose level three times a day. Once before breakfast, once before noon and once before bedtime.  Please bring in the meter during next office visit.

## 2012-01-06 DIAGNOSIS — Z0000 Encounter for general adult medical examination without abnormal findings: Secondary | ICD-10-CM | POA: Insufficient documentation

## 2012-01-06 LAB — CBC WITH DIFFERENTIAL/PLATELET
Basophils Absolute: 0 10*3/uL (ref 0.0–0.1)
Basophils Relative: 0 % (ref 0–1)
Eosinophils Absolute: 0.1 10*3/uL (ref 0.0–0.7)
Eosinophils Relative: 1 % (ref 0–5)
MCH: 30 pg (ref 26.0–34.0)
MCHC: 31.6 g/dL (ref 30.0–36.0)
MCV: 95 fL (ref 78.0–100.0)
Neutrophils Relative %: 50 % (ref 43–77)
Platelets: 249 10*3/uL (ref 150–400)
RBC: 4.16 MIL/uL (ref 3.87–5.11)
RDW: 14.6 % (ref 11.5–15.5)

## 2012-01-06 LAB — PHOSPHORUS: Phosphorus: 3.3 mg/dL (ref 2.3–4.6)

## 2012-01-06 LAB — TSH: TSH: 0.764 u[IU]/mL (ref 0.350–4.500)

## 2012-01-06 NOTE — Assessment & Plan Note (Signed)
Tdap and Pneumococcal vaccination today.

## 2012-01-06 NOTE — Assessment & Plan Note (Addendum)
Hgb A1c improved from 8.8 11 month ago to 7.5 today but she is not at goal. Patient did not  bring her meter with her therefore I was not able to evaluated what her CBG are to make changes in management. She denies any lows. I recommended to check CBG three times a day and bring in the meter with her during next office visit for possible changes in management. Furthermore referred patient to diabetic Educator to assist with diet and exercise recommendation since patient gained almost 10 pounds since last office visit.    Foot exam performed today. WNL

## 2012-01-06 NOTE — Assessment & Plan Note (Signed)
History of sinus tachycardia. Will obtain TSH and CBC to evaluate for possible anemia.

## 2012-01-06 NOTE — Assessment & Plan Note (Signed)
Lipid panel obtained and LDL at goal. LFT within normal limits. Continue current regimen with Pravastatin of 40 mg.

## 2012-01-06 NOTE — Assessment & Plan Note (Signed)
Blood pressure well controlled. Continue current regimen. BP Readings from Last 3 Encounters:  01/05/12 129/82  02/16/11 126/75  10/31/10 126/82

## 2012-01-06 NOTE — Assessment & Plan Note (Addendum)
Patient has history of Hypercalcemia since 2009. I will obtain PTH. If PTH elevated consider to measure 24 hour urinary calcium excretion off lasix for at least one week per Uptodate.

## 2012-01-08 ENCOUNTER — Encounter: Payer: Self-pay | Admitting: Internal Medicine

## 2012-01-08 LAB — PTH, INTACT AND CALCIUM
Calcium, Total (PTH): 11.3 mg/dL — ABNORMAL HIGH (ref 8.4–10.5)
PTH: 46.8 pg/mL (ref 14.0–72.0)

## 2012-01-09 LAB — VITAMIN D 1,25 DIHYDROXY: Vitamin D3 1, 25 (OH)2: 89 pg/mL

## 2012-01-15 ENCOUNTER — Encounter: Payer: Self-pay | Admitting: Internal Medicine

## 2012-01-16 ENCOUNTER — Ambulatory Visit (HOSPITAL_COMMUNITY)
Admission: RE | Admit: 2012-01-16 | Discharge: 2012-01-16 | Disposition: A | Discharge status: Home / Self Care | Payer: 59 | Source: Ambulatory Visit | Attending: Infectious Diseases | Admitting: Infectious Diseases

## 2012-01-16 DIAGNOSIS — Z1231 Encounter for screening mammogram for malignant neoplasm of breast: Secondary | ICD-10-CM | POA: Insufficient documentation

## 2012-01-19 NOTE — Progress Notes (Signed)
Addended by: Neomia Dear on: 01/19/2012 05:45 PM   Modules accepted: Orders

## 2012-01-25 ENCOUNTER — Other Ambulatory Visit: Payer: Self-pay | Admitting: *Deleted

## 2012-01-27 MED ORDER — GLUCOSE BLOOD VI STRP: ORAL_STRIP | Status: DC

## 2012-01-31 ENCOUNTER — Ambulatory Visit (INDEPENDENT_AMBULATORY_CARE_PROVIDER_SITE_OTHER): Payer: 59 | Admitting: Internal Medicine

## 2012-01-31 ENCOUNTER — Encounter: Payer: Self-pay | Admitting: Internal Medicine

## 2012-01-31 VITALS — BP 127/77 | HR 98 | Temp 98.6°F | Ht 61.75 in | Wt 182.6 lb

## 2012-01-31 DIAGNOSIS — R6882 Decreased libido: Secondary | ICD-10-CM | POA: Insufficient documentation

## 2012-01-31 DIAGNOSIS — H9190 Unspecified hearing loss, unspecified ear: Secondary | ICD-10-CM | POA: Insufficient documentation

## 2012-01-31 DIAGNOSIS — I10 Essential (primary) hypertension: Secondary | ICD-10-CM

## 2012-01-31 DIAGNOSIS — E119 Type 2 diabetes mellitus without complications: Secondary | ICD-10-CM

## 2012-01-31 NOTE — Assessment & Plan Note (Signed)
Patient tells me she is happy in her relationship and no feelings of depression.  This was my first visit with patient.  Will reassess at next visit.  We may consider testosteone 150-314mcg patch, but effects on breast Ca are uncertain.  Also, testosterone therapy should be pursued with caution in those with CVD, hepatic disease and breast Ca risk, and patient is a diabetic, which is a CV equivalent.  Should we proceed with testosterone therapy, she will need to have lipids and liver function assessed 6 mo after initiation of treatment. If she displays signs of depression, may consider buproprion.

## 2012-01-31 NOTE — Assessment & Plan Note (Signed)
No cerumen b/l.  Will refer to audiology.

## 2012-01-31 NOTE — Progress Notes (Signed)
Subjective:   Patient ID: Michele Andrews female   DOB: 10-Jul-1951 61 y.o.   MRN: [id number]  HPI: [name] is a 61 y.o. woman with PMH significant for HTN, DM, HLD and hypercalcemia p/w  Type 2 DM Checks blood sugar: 2-3x/day; unable to download glucometer, but diary reviewed, AM fasting CBGs 82-167, before lunch 75-212 and at bedtime 72-241. Associated symptoms: Denies polyuria, polydipsia, blurry vision; no symptoms of hypoglycemia such as HA, dizziness, diaphoresis Last Eye exam: August 2012, she will have records sent to [place] Last Lipid Panel: 01/02/12, LDL = 89, compliant with statin therapy Exercise?  Not lately hurt left knee, mechanical injury in Dec (against bed frame), but improving with ibuprofen and elevation, plans to start exercising Diet?  Working on diet, increase fruits and veggies Last visit with dietician: Works with Lanora Manis (pharmacist) with [facility], helps with weight as well  Other complaints today include concern regarding right early loss, which she describes more specifically as not hearing the right things, but she does hear noise.  Agrees that she hears men better than women, suggesting high frequency hearing loss.  Concerned about thinning hair/hair loss.  She is also concerned about decreased libido, which has been constant since 1992 after hysterectomy, and brings it up today after her husband asked her to.  Has been on HRT in the past, but was not comfortable with side effects.  Occasional constipation, but no diarrhea. No abdominal pain or blood in urine.  No CP/SOB/dizziness.   Past Medical History  Diagnosis Date  . Hypertension   . Diabetes mellitus   . Hypercalcemia   . Hyperlipidemia   . Carpal tunnel syndrome   . Rotator cuff syndrome of right shoulder   . Osteopenia    Current Outpatient Prescriptions  Medication Sig Dispense Refill  . Blood Glucose Monitoring Suppl (TRUETRACK BLOOD GLUCOSE) W/DEVICE KIT Use to check blood sugar 2  times a day       . furosemide (LASIX) 20 MG tablet Take 1 tablet (20 mg total) by mouth 2 (two) times daily.  60 tablet  2  . glipiZIDE (GLUCOTROL XL) 10 MG 24 hr tablet Take 1 tablet (10 mg total) by mouth daily.  30 tablet  2  . glucose blood (TRUETRACK TEST) test strip Use to check blood sugar 2 times a day  100 each  6  . ibuprofen (ADVIL,MOTRIN) 800 MG tablet Take 1 tablet (800 mg total) by mouth every 8 (eight) hours. With food, as needed for pain  30 tablet  0  . insulin glargine (LANTUS) 100 UNIT/ML injection Inject 30 Units into the skin at bedtime.      . Lancets (ACCU-CHEK SOFT TOUCH) lancets Use to check blood sugar 2 times a day       . lisinopril (PRINIVIL,ZESTRIL) 10 MG tablet Take 1 tablet (10 mg total) by mouth daily.  30 tablet  2  . metFORMIN (GLUCOPHAGE) 1000 MG tablet Take 1 tablet (1,000 mg total) by mouth 2 (two) times daily with a meal.  60 tablet  2  . pravastatin (PRAVACHOL) 40 MG tablet Take 1 tablet (40 mg total) by mouth daily.  30 tablet  2   Family History  Problem Relation Age of Onset  . Hypertension Mother   . Cancer Mother    History   Social History  . Marital Status: Married    Spouse Name: N/A    Number of Children: N/A  . Years of Education: N/A  Social History Main Topics  . Smoking status: Never Smoker   . Smokeless tobacco: None  . Alcohol Use: No  . Drug Use: No  . Sexually Active: None   Other Topics Concern  . None   Social History Narrative  . None   Review of Systems: General: no fevers, chills, changes in weight, changes in appetite Skin: no rash HEENT: no blurry vision, hearing changes, sore throat Pulm: no dyspnea, coughing, wheezing CV: no chest pain, palpitations, shortness of breath Abd: no abdominal pain, nausea/vomiting, diarrhea/constipation GU: no dysuria, hematuria, polyuria Ext: no arthralgias, myalgias Neuro: no weakness, numbness, or tingling  Objective:  Physical Exam: Filed Vitals:   01/31/12 1545    BP: 127/77  Pulse: 98  Height: 5' 1.75" (1.568 m)  Weight: 182 lb 9.6 oz (82.827 kg)   Constitutional: Vital signs reviewed.  Patient is a well-developed and well-nourished woman in no acute distress and cooperative with exam.  Ear: TM normal bilaterally, no cerumen impaction  Eyes: PERRL, EOMI, conjunctivae normal, No scleral icterus.  Neck: No thyromegaly Cardiovascular: RRR, S1 normal, S2 normal, no MRG, pulses symmetric and intact bilaterally Pulmonary/Chest: CTAB, no wheezes, rales, or rhonchi Abdominal: Soft. Non-tender, non-distended, bowel sounds are normal, no masses, organomegaly, or guarding present.  GU: no CVA tenderness Musculoskeletal: No joint deformities, erythema, or stiffness, ROM full and no nontender Neurological: A&O x3, Strength is normal and symmetric bilaterally, cranial nerve II-XII are grossly intact, no focal motor deficit, sensory intact to light touch bilaterally.  Psychiatric: Normal mood and affect. speech and behavior is normal. Judgment and thought content normal. Cognition and memory are normal.   Assessment & Plan:  Case and care discussed with Dr. Aundria Rud.  Patient to return in 1 month to discuss hypercalcemia.  Please see problem oriented charting for further details.

## 2012-01-31 NOTE — Patient Instructions (Signed)
I am placing orders for a few more labs, please call the clinic to let them know when works best for you to come for labs/urine sample.  I will place an order for a hearing test, and you will be called about setting that up.  Please continue your log of blood sugars, and do intensive glucose monitoring for 3 days like we discussed.  Please be sure to bring all of your medications with you to every visit.  Should you have any new or worsening symptoms, please be sure to call the clinic at [phone].

## 2012-01-31 NOTE — Assessment & Plan Note (Signed)
Lab Results  Component Value Date   NA 139 01/02/2012   K 4.2 01/02/2012   CL 107 01/02/2012   CO2 27 01/02/2012   BUN 14 01/02/2012   CREATININE 0.66 01/02/2012   CREATININE 0.60 03/21/2011   CREATININE 0.75 01/05/2010    BP Readings from Last 3 Encounters:  01/31/12 127/77  01/05/12 129/82  02/16/11 126/75    Assessment: Hypertension control:  controlled  Progress toward goals:  at goal Barriers to meeting goals:  no barriers identified  Plan: Hypertension treatment:  continue current medications

## 2012-01-31 NOTE — Assessment & Plan Note (Signed)
Last serum Ca was 10.8 on 01/02/12.  The next step is repeating calcium, which I will do by ionized calcium (UTD suggests repeating total Ca corrected for albumin or ionized).  Intact PTH was already measured on 2 occassions.  Most recently on 01/05/12, iPTH was 46.8 which is mid to high normal.  Prior to that, iPTH was 81.2 on 03/21/11.  This suggets likely pHPTH.  The next step is to measure urinary Ca excretion, which was elevated with urine volume of 312, supposedly done off of lasix per chart review.  This work up [employer].  Because Vit D 1,25-OH was elevated, I will pursue Vit D 25-OH, which has more of a steady state in the blood stream.  I do not suspect malignancy or sarcoidosis, but will continue to monitor.  Because of hypercalcemia history, patient is not being treated with HCTZ.  Should ionized Ca be elevated, we may consider checking SPEP, UPEP, and CXR, though if this was the etiology, I would suspect iPTH to be suppressed.  If patient remains asymptomatic, she should be managed conservatively, without surgery, and continue to monitor closely.   Criteria for surgery include Serum Ca >39md/dL above upper limit of normal, Cr Cl <19mL/min, bone mineral density T score <2.5.    She will need referral for bone density scan.

## 2012-01-31 NOTE — Assessment & Plan Note (Signed)
Lab Results  Component Value Date   HGBA1C 7.5 01/05/2012   HGBA1C 9.2 10/31/2010   CREATININE 0.66 01/02/2012   CREATININE 0.60 03/21/2011   CREATININE 0.75 01/05/2010   MICROALBUR 0.50 03/21/2011   MICRALBCREAT 4.0 03/21/2011   CHOL 161 01/02/2012   HDL 50 01/02/2012   TRIG 108 01/02/2012    Last eye exam and foot exam:    Component Value Date/Time   HMDIABEYEEXA no diabetic retinopathy 08/29/2010   HMDIABFOOTEX done 03/30/2010    Assessment: Diabetes control: not controlled Progress toward goals: unchanged Barriers to meeting goals: no barriers identified  Plan: Diabetes treatment: continue current medications, patient to do intense glucose monitoring for 3 days so that I can assess post prandial glucose.  May consider adding mealtime insulin, increasing lantus and discontinuing glipizide, as now that patient is on insulin, glipizide may not prove much benefit. Will discuss with patient at next visit.  Refer to: none Instruction/counseling given: reminded to bring blood glucose meter & log to each visit, reminded to bring medications to each visit, discussed foot care, discussed the need for weight loss and discussed diet

## 2012-02-02 ENCOUNTER — Other Ambulatory Visit (INDEPENDENT_AMBULATORY_CARE_PROVIDER_SITE_OTHER): Payer: 59

## 2012-02-08 ENCOUNTER — Ambulatory Visit (HOSPITAL_COMMUNITY)
Admission: RE | Admit: 2012-02-08 | Discharge: 2012-02-08 | Disposition: A | Discharge status: Home / Self Care | Payer: 59 | Source: Ambulatory Visit | Attending: Internal Medicine | Admitting: Internal Medicine

## 2012-02-21 ENCOUNTER — Ambulatory Visit: Payer: 59 | Attending: Internal Medicine | Admitting: Audiology

## 2012-02-21 DIAGNOSIS — H903 Sensorineural hearing loss, bilateral: Secondary | ICD-10-CM | POA: Insufficient documentation

## 2012-02-26 ENCOUNTER — Telehealth: Payer: Self-pay | Admitting: Internal Medicine

## 2012-02-26 NOTE — Telephone Encounter (Signed)
Called [facility] for Audiology appt on 02/21/2012.  Spoke with Quenten Raven and she stated patient kept her appointment.  Office note should have already been sent last week.  They will resend office visit as they are only on the cadence part for appointments and Dr. Notes will not go live until 2014.

## 2012-02-26 NOTE — Telephone Encounter (Signed)
Rec'd phone call from Quenten Raven stating that she has faxed records to our office for patient's Audiology appointment on 02/21/2012.  Last office records have been received.

## 2012-03-01 ENCOUNTER — Other Ambulatory Visit: Payer: Self-pay | Admitting: *Deleted

## 2012-03-01 DIAGNOSIS — E119 Type 2 diabetes mellitus without complications: Secondary | ICD-10-CM

## 2012-03-01 MED ORDER — INSULIN GLARGINE 100 UNIT/ML ~~LOC~~ SOLN: 30.0000 [IU] | Freq: Every day | SUBCUTANEOUS | Status: DC

## 2012-03-01 MED ORDER — INSULIN PEN NEEDLE 31G X 5 MM MISC: 1.0000 | Freq: Three times a day (TID) | Status: DC

## 2012-03-01 NOTE — Telephone Encounter (Signed)
Pharmacy needs refill lantus solo PFS 5x7ml - last refill1/16/13 - direction are inject 28 units into the skin at bedtime. If correct - please change in Epic.

## 2012-04-08 ENCOUNTER — Other Ambulatory Visit: Payer: Self-pay | Admitting: Internal Medicine

## 2012-04-10 ENCOUNTER — Encounter: Payer: 59 | Admitting: Internal Medicine

## 2012-04-10 ENCOUNTER — Other Ambulatory Visit: Payer: Self-pay | Admitting: Internal Medicine

## 2012-04-10 DIAGNOSIS — E119 Type 2 diabetes mellitus without complications: Secondary | ICD-10-CM

## 2012-04-10 NOTE — Assessment & Plan Note (Signed)
Patient missed appt on 04/10/12.  I called her on day of her appt, and she had forgotten.  I asked that she discontinue glipizide for now, as documentation from Orlin Hilding, PharmD notes that patient is self adjusting lantus dose to as few as 10u qHS depending on her glucose.  Prior to these self adjustments, she would wake up with CBGs 40-60s.  She agrees to d/c glipizide.    She will re-schedule appt for 1-2 weeks for DM follow up.

## 2012-05-17 ENCOUNTER — Other Ambulatory Visit: Payer: Self-pay | Admitting: Internal Medicine

## 2012-05-29 ENCOUNTER — Telehealth: Payer: Self-pay | Admitting: *Deleted

## 2012-05-29 NOTE — Telephone Encounter (Signed)
Pt called - since stopping Glipizide has noticed CBG are higher 125-136. Was to have FU 1-2 weeks. No appt made - appt made to be seen and to bring CBG meter.Stanton Kidney Lynnett Langlinais RN 05/29/12 2:15PM

## 2012-06-05 ENCOUNTER — Ambulatory Visit (HOSPITAL_COMMUNITY)
Admission: RE | Admit: 2012-06-05 | Discharge: 2012-06-05 | Disposition: A | Discharge status: Home / Self Care | Payer: 59 | Source: Ambulatory Visit | Attending: Internal Medicine | Admitting: Internal Medicine

## 2012-06-05 ENCOUNTER — Ambulatory Visit (INDEPENDENT_AMBULATORY_CARE_PROVIDER_SITE_OTHER): Payer: 59 | Admitting: Internal Medicine

## 2012-06-05 ENCOUNTER — Encounter: Payer: Self-pay | Admitting: Internal Medicine

## 2012-06-05 VITALS — BP 123/81 | HR 98 | Temp 97.8°F | Ht 63.0 in | Wt 173.4 lb

## 2012-06-05 DIAGNOSIS — Z79899 Other long term (current) drug therapy: Secondary | ICD-10-CM

## 2012-06-05 DIAGNOSIS — E119 Type 2 diabetes mellitus without complications: Secondary | ICD-10-CM

## 2012-06-05 DIAGNOSIS — Z0000 Encounter for general adult medical examination without abnormal findings: Secondary | ICD-10-CM | POA: Insufficient documentation

## 2012-06-05 LAB — GLUCOSE, CAPILLARY: Glucose-Capillary: 149 mg/dL — ABNORMAL HIGH (ref 70–99)

## 2012-06-05 LAB — POCT GLYCOSYLATED HEMOGLOBIN (HGB A1C): Hemoglobin A1C: 7.3

## 2012-06-05 NOTE — Addendum Note (Signed)
Addended by: Bufford Spikes on: 06/05/2012 09:50 AM   Modules accepted: Orders

## 2012-06-05 NOTE — Assessment & Plan Note (Signed)
Patient is currently very well controlled. She was asking me about restarting Glucophage but I think that she would not need it anymore. Patient continues to exercise and I congratulated her on such good control of her diabetes. LDL well controlled. Ophthalmology exam in August. Urine microalbumin creatinine ratio today. Blood pressure at goal. Followup in 3 months with primary care physician.

## 2012-06-05 NOTE — Patient Instructions (Signed)
Hypercalcemia Hypercalcemia means the calcium in your blood is too high. A level above 10.5 milligrams per deciliter of blood is considered high. Calcium in our blood is important for the control of many things, such as:  Blood clotting.   Conducting of nerve impulses.   Muscle contraction.   Maintaining teeth and bone health.   Other body functions.  In the bloodstream, calcium maintains a constant balance with another mineral, phosphate. Calcium is absorbed into the body through the small intestine. This is helped by Vitamin D. Calcium levels are maintained mostly by vitamin D and a hormone (parathyroid hormone). But the kidneys also help. Hypercalcemia can happen when the concentration of calcium is too high for the kidneys to maintain balance. The body maintains a balance between the calcium we eat and the calcium already in our body. If calcium intake is increased or we cannot use calcium properly, there may be problems. Some common sources of calcium are:   Dairy products.   Nuts.   Eggs.   Whole grains.   Legumes.   Green leafy vegetables.  CAUSES There are many causes of this condition, but some common ones are:  Hyperparathyroidism. This is an over activity of the parathyroid gland.   Cancers of the breast, kidney, lung, head and neck are common causes of calcium increases.   Medications that cause you to urinate more often (diuretics), nausea, vomiting and diarrhea also increase the calcium in the blood.   Overuse of calcium-containing antacids.  SYMPTOMS  Many patients with mild hypercalcemia have no symptoms. For those with symptoms common problems include:  Loss of appetite.   Constipation.   Increased thirst.   Heart rhythm changes.   Abnormal thinking.   Nausea.   Abdominal pain.   Kidney stones.   Mood swings.   Coma and death when severe.   Vomiting.   Increased urination.   High blood pressure.   Confusion.  DIAGNOSIS   Your  caregiver will do a medical history and perform a physical exam on you.   Calcium and parathyroid hormone (PTH) may be measured with a blood test.  TREATMENT   The treatment depends on the calcium level and what is causing the higher level. Hypercalcemia can be lifethreatening. Fast lowering of the calcium level may be necessary.   With normal kidney function, fluids can be given by vein to clear the excess calcium. Hemodialysis works well to reduce dangerous calcium levels if there is poor kidney function. This is a procedure in which a machine is used to filter out unwanted substances. The blood is then returned to the body.   Drugs, such as diuretics, can be given after adequate fluid intake is established. These medications help the kidneys get rid of extra calcium. Drugs that lessen (inhibit) bone loss are helpful in gaining long-term control. Phosphate pills help lower high calcium levels caused by a low supply of phosphate. Anti-inflammatory agents such as steroids are helpful with some cancers and toxic levels of vitamin D.   Treatment of the underlying cause of the hypercalcemia will also correct the imbalance. Hyperparathyroidism is usually treated by surgical removal of one or more of the parathyroid glands and any tissue, other than the glands themselves, that is producing too much hormone.   The hypercalcemia caused by cancer is difficult to treat without controlling the cancer. Symptoms can be improved with fluids and drug therapy as outlined above.  PROGNOSIS   Surgery to remove the parathyroid glands is usually successful.  This also depends on the amount of damage to the kidneys and whether or not it can be treated.   Mild hypercalcemia can be controlled with good fluid intake and the use of effective medications.   Hypercalcemia often develops as a late complication of cancer. The expected outlook is poor without effective anticancer therapy.  PREVENTION   If you are at risk  for developing hypercalcemia, be familiar with early symptoms. Report these to your caregiver.   Good fluid intake (up to four quarts of liquid a day if possible) is helpful.   Try to control nausea and vomiting, and treat fevers to avoid dehydration.   Lowering the amount of calcium in your diet is not necessary. High blood calcium reduces absorption of calcium in the intestine.   Stay as active as possible.  SEEK IMMEDIATE MEDICAL CARE IF:   You develop chest pain, sweating, or shortness of breath.   You get confused, feel faint or pass out.   You develop severe nausea and vomiting.  MAKE SURE YOU:   Understand these instructions.   Will watch your condition.   Will get help right away if you are not doing well or get worse.  Document Released: 02/10/2005 Document Revised: 11/16/2011 Document Reviewed: 11/22/2010 [facility] Patient Information 2012 [place], [place].

## 2012-06-05 NOTE — Assessment & Plan Note (Signed)
Patient most likely has primary hyperparathyroidism. Some of the secondary causes like multiple myeloma, sarcoidosis and lung malignancy has not be ruled out although the suspicion is very low. I will check SPEP, UPEP and chest x-ray today as noted by her primary care physician Dr. Milbert Coulter. I will followup on the test results and call her back.

## 2012-06-05 NOTE — Progress Notes (Signed)
  Subjective:    Patient ID: Michele Andrews, female    DOB: October 29, 1951, 61 y.o.   MRN: [id number]  HPI  Patient is a 61 year old female with past medical history most significant for type 2 diabetes, hypercalcemia, hypertension and right rotator cuff syndrome.  Patient is here today for her six-month checkup.  Her HbA1c is decreased to 7.3. We are checking microalbumin creatinine and her urine today. Patient has an ophthalmology appointment coming up in August. Patient's LDL is well-controlled.  Patient denies any complaints of chest pain shortness of breath, back pain, palpitations, weight loss, decreased appetite or change in bowel movements.  Review of Systems  Constitutional: Negative for fever, activity change and appetite change.  HENT: Negative for sore throat.   Respiratory: Negative for cough and shortness of breath.   Cardiovascular: Negative for chest pain and leg swelling.  Gastrointestinal: Negative for nausea, abdominal pain, diarrhea, constipation and abdominal distention.  Genitourinary: Negative for frequency, hematuria and difficulty urinating.  Neurological: Negative for dizziness and headaches.  Psychiatric/Behavioral: Negative for suicidal ideas and behavioral problems.       Objective:   Physical Exam  Constitutional: She is oriented to person, place, and time. She appears well-developed and well-nourished.  HENT:  Head: Normocephalic and atraumatic.  Eyes: Conjunctivae and EOM are normal. Pupils are equal, round, and reactive to light. No scleral icterus.  Neck: Normal range of motion. Neck supple. No JVD present. No thyromegaly present.  Cardiovascular: Normal rate, regular rhythm, normal heart sounds and intact distal pulses.  Exam reveals no gallop and no friction rub.   No murmur heard. Pulmonary/Chest: Effort normal and breath sounds normal. No respiratory distress. She has no wheezes. She has no rales.  Abdominal: Soft. Bowel sounds are normal. She  exhibits no distension and no mass. There is no tenderness. There is no rebound and no guarding.  Musculoskeletal: Normal range of motion. She exhibits no edema and no tenderness.  Lymphadenopathy:    She has no cervical adenopathy.  Neurological: She is alert and oriented to person, place, and time.  Psychiatric: She has a normal mood and affect. Her behavior is normal.          Assessment & Plan:

## 2012-06-07 LAB — PROTEIN ELECTROPHORESIS, URINE REFLEX

## 2012-06-07 LAB — PROTEIN ELECTROPHORESIS, SERUM, WITH REFLEX
Alpha-1-Globulin: 3.9 % (ref 2.9–4.9)
Alpha-2-Globulin: 10.5 % (ref 7.1–11.8)
Total Protein, Serum Electrophoresis: 8 g/dL (ref 6.0–8.3)

## 2012-07-11 ENCOUNTER — Other Ambulatory Visit: Payer: Self-pay | Admitting: *Deleted

## 2012-07-11 DIAGNOSIS — E119 Type 2 diabetes mellitus without complications: Secondary | ICD-10-CM

## 2012-07-12 MED ORDER — METFORMIN HCL 1000 MG PO TABS: 1000.0000 mg | ORAL_TABLET | Freq: Two times a day (BID) | ORAL | Status: DC

## 2012-07-26 ENCOUNTER — Telehealth: Payer: Self-pay | Admitting: *Deleted

## 2012-07-26 DIAGNOSIS — Z23 Encounter for immunization: Secondary | ICD-10-CM

## 2012-07-26 MED ORDER — ZOSTER VACCINE LIVE 19400 UNT/0.65ML ~~LOC~~ SOLR: 0.6500 mL | Freq: Once | SUBCUTANEOUS | Status: AC

## 2012-07-26 NOTE — Telephone Encounter (Signed)
Dr. Everardo Beals wrote the script for the patient.

## 2012-07-26 NOTE — Telephone Encounter (Signed)
Were you going to write pt a script for the vaccine? She states at last appt she was told she would get one and realized later she didn't have the script. Please advise.

## 2012-07-29 ENCOUNTER — Encounter: Payer: Self-pay | Admitting: Internal Medicine

## 2012-07-30 ENCOUNTER — Other Ambulatory Visit: Payer: Self-pay | Admitting: *Deleted

## 2012-07-30 MED ORDER — LISINOPRIL 10 MG PO TABS: 10.0000 mg | ORAL_TABLET | Freq: Every day | ORAL | Status: DC

## 2012-09-05 ENCOUNTER — Other Ambulatory Visit: Payer: Self-pay | Admitting: *Deleted

## 2012-09-05 DIAGNOSIS — Q752 Hypertelorism: Secondary | ICD-10-CM

## 2012-09-07 MED ORDER — FUROSEMIDE 20 MG PO TABS: 20.0000 mg | ORAL_TABLET | Freq: Two times a day (BID) | ORAL | Status: DC

## 2012-10-02 ENCOUNTER — Ambulatory Visit (INDEPENDENT_AMBULATORY_CARE_PROVIDER_SITE_OTHER): Payer: 59 | Admitting: Internal Medicine

## 2012-10-02 ENCOUNTER — Encounter: Payer: Self-pay | Admitting: Internal Medicine

## 2012-10-02 VITALS — BP 125/75 | HR 98 | Temp 97.9°F | Wt 171.7 lb

## 2012-10-02 DIAGNOSIS — Z79899 Other long term (current) drug therapy: Secondary | ICD-10-CM

## 2012-10-02 DIAGNOSIS — H9190 Unspecified hearing loss, unspecified ear: Secondary | ICD-10-CM

## 2012-10-02 DIAGNOSIS — E210 Primary hyperparathyroidism: Secondary | ICD-10-CM

## 2012-10-02 DIAGNOSIS — I10 Essential (primary) hypertension: Secondary | ICD-10-CM

## 2012-10-02 DIAGNOSIS — E119 Type 2 diabetes mellitus without complications: Secondary | ICD-10-CM

## 2012-10-02 LAB — BASIC METABOLIC PANEL
Calcium: 10.9 mg/dL — ABNORMAL HIGH (ref 8.4–10.5)
Chloride: 104 mEq/L (ref 96–112)
Creat: 0.64 mg/dL (ref 0.50–1.10)
Potassium: 4.3 mEq/L (ref 3.5–5.3)

## 2012-10-02 LAB — POCT GLYCOSYLATED HEMOGLOBIN (HGB A1C): Hemoglobin A1C: 8.1

## 2012-10-02 LAB — GLUCOSE, CAPILLARY: Glucose-Capillary: 101 mg/dL — ABNORMAL HIGH (ref 70–99)

## 2012-10-02 NOTE — Progress Notes (Signed)
Subjective:   Patient ID: Michele Andrews female   DOB: [birth year] 61 y.o.   MRN: [id number]  HPI: Michele Andrews is a 61 y.o. PMH significant for HTN, DM, HLD and primary hyperparathyroidism presents for routine follow up.    Type 2 DM  Checks blood sugar: every few days; unable to download glucometer, CBGs 70s-130s  Associated symptoms: Denies polyuria, polydipsia, blurry vision; seldom feels shakey if CBGs in 70s, knows what to do if CBGs drop Last Eye exam: October 2013, told all was well  Last Lipid Panel: 01/02/12, LDL = 89, compliant with statin therapy  Exercise? Walking around the hospital daily Diet? Cut down on fatty fried foods (fries once a week)/bread, more fruits/veggies Last visit with dietician: Works with Lanora Manis (pharmacist) with [facility], helps with weight as well  Hypercalcemia: Polyuria/dipsia? no Appetite? Good, but cutting back on purpose Abdominal pain? no Constipation? Yes - 1BM/day, "not what it should be"; uses stool softener occasionally  Dyspepsia (PUD)? occ indigestion  Muscle pain? no Bone pain?  Femur -1.6, no Confusion/fatigue/concentration? good BP control? good   Past Medical History  Diagnosis Date  . Hypertension   . Diabetes mellitus   . Hypercalcemia   . Hyperlipidemia   . Carpal tunnel syndrome   . Rotator cuff syndrome of right shoulder   . Osteopenia    Current Outpatient Prescriptions  Medication Sig Dispense Refill  . Blood Glucose Monitoring Suppl (TRUETRACK BLOOD GLUCOSE) W/DEVICE KIT Use to check blood sugar 2 times a day       . furosemide (LASIX) 20 MG tablet Take 1 tablet (20 mg total) by mouth 2 (two) times daily.  60 tablet  2  . glucose blood (TRUETRACK TEST) test strip Use to check blood sugar 2 times a day  100 each  6  . ibuprofen (ADVIL,MOTRIN) 800 MG tablet Take 1 tablet (800 mg total) by mouth every 8 (eight) hours. With food, as needed for pain  30 tablet  0  . insulin glargine (LANTUS SOLOSTAR) 100  UNIT/ML injection Inject 30 Units into the skin at bedtime.  10 mL  12  . Insulin Pen Needle 31G X 5 MM MISC 1 each by Does not apply route 3 (three) times daily before meals.  100 each  11  . Lancets (ACCU-CHEK SOFT TOUCH) lancets Use to check blood sugar 2 times a day       . lisinopril (PRINIVIL,ZESTRIL) 10 MG tablet Take 1 tablet (10 mg total) by mouth daily.  30 tablet  2  . metFORMIN (GLUCOPHAGE) 1000 MG tablet Take 1 tablet (1,000 mg total) by mouth 2 (two) times daily with a meal.  60 tablet  6  . pravastatin (PRAVACHOL) 40 MG tablet TAKE 1 TABLET (40 MG TOTAL) BY MOUTH DAILY.  30 tablet  PRN   Family History  Problem Relation Age of Onset  . Hypertension Mother   . Cancer Mother    History   Social History  . Marital Status: Married    Spouse Name: N/A    Number of Children: N/A  . Years of Education: N/A   Social History Main Topics  . Smoking status: Never Smoker   . Smokeless tobacco: None  . Alcohol Use: No  . Drug Use: No  . Sexually Active: None   Other Topics Concern  . None   Social History Narrative  . None   Review of Systems: Constitutional: Denies fever, chills, diaphoresis, appetite change and fatigue.  HEENT: Denies  photophobia, eye pain, redness, hearing loss, ear pain, congestion, sore throat, rhinorrhea, sneezing, mouth sores, trouble swallowing, neck pain, neck stiffness and tinnitus.   Respiratory: Denies SOB, DOE, cough, chest tightness,  and wheezing.   Cardiovascular: Denies chest pain, palpitations and leg swelling.  Gastrointestinal: Denies nausea, vomiting, abdominal pain, diarrhea, constipation, blood in stool and abdominal distention.  Genitourinary: Denies dysuria, urgency, frequency, hematuria, flank pain and difficulty urinating.  Musculoskeletal: Denies myalgias, back pain, joint swelling, arthralgias and gait problem.  Skin: Denies pallor, rash and wound.  Neurological: Denies dizziness, seizures, syncope, weakness, light-headedness,  numbness and headaches.   Psychiatric/Behavioral: Denies suicidal ideation, mood changes, confusion, nervousness, sleep disturbance and agitation  Objective:  Physical Exam: Filed Vitals:   10/02/12 0827  BP: 125/75  Pulse: 98  Temp: 97.9 F (36.6 C)  TempSrc: Oral  Weight: 171 lb 11.2 oz (77.883 kg)   Constitutional: Vital signs reviewed.  Patient is a well-developed and well-nourished woman in no acute distress and cooperative with exam.   Mouth: no erythema or exudates, MMM Eyes: PERRL, EOMI, conjunctivae normal, No scleral icterus.  Neck: No thyromegaly Cardiovascular: RRR, S1 normal, S2 normal, no MRG, pulses symmetric and intact bilaterally Pulmonary/Chest: CTAB, no wheezes, rales, or rhonchi Abdominal: Soft. Non-tender, non-distended, bowel sounds are normal, no masses, organomegaly, or guarding present.  Musculoskeletal: No joint deformities, erythema, or stiffness, ROM full and no nontender Neurological: A&O x3, Strength is normal and symmetric bilaterally, cranial nerve II-XII are grossly intact, no focal motor deficit, sensory intact to light touch bilaterally.  Skin: Warm, dry and intact. No rash, cyanosis, or clubbing.  Psychiatric: Normal mood and affect. speech and behavior is normal. Judgment and thought content normal. Cognition and memory are normal.   Assessment & Plan:  Case and care discussed with Dr. Criselda Peaches. Please see problem oriented charting for further details. Patient to return in 1 month to review CBGs and titrate insulin regimen up.

## 2012-10-02 NOTE — Patient Instructions (Signed)
-  Please check your blood sugar 3 times daily for the next month so we can figure out how to titrate up your insulin - You've done a great job with diet and exercise, but also try to cut sweets out of your diet!    Please be sure to bring all of your medications with you to every visit.  Should you have any new or worsening symptoms, please be sure to call the clinic at [phone].

## 2012-10-02 NOTE — Assessment & Plan Note (Signed)
Lab Results  Component Value Date   HGBA1C 8.1 10/02/2012   HGBA1C 9.2 10/31/2010   CREATININE 0.66 01/02/2012   CREATININE 0.60 03/21/2011   CREATININE 0.75 01/05/2010   MICROALBUR 0.50 03/21/2011   MICRALBCREAT 4.0 03/21/2011   CHOL 161 01/02/2012   HDL 50 01/02/2012   TRIG 108 01/02/2012    Last eye exam and foot exam:    Component Value Date/Time   HMDIABEYEEXA no diabetic retinopathy 08/29/2010   HMDIABFOOTEX done 03/30/2010    Assessment: Diabetes control: not controlled Progress toward goals: deteriorated Barriers to meeting goals: lack of understanding of disease management  Plan: Diabetes treatment: Patient has not been checking CBGs, but is extremely motivated.  She has increased exercise and improved diet, except her weakness is desserts.  She will cut back on sweets and check CBGs 3 times daily for the next month and return for further mgmt of insulin in 1 month. Refer to: none Instruction/counseling given: reminded to bring blood glucose meter & log to each visit, reminded to bring medications to each visit and discussed diet

## 2012-10-02 NOTE — Assessment & Plan Note (Signed)
Lab Results  Component Value Date   NA 139 01/02/2012   K 4.2 01/02/2012   CL 107 01/02/2012   CO2 27 01/02/2012   BUN 14 01/02/2012   CREATININE 0.66 01/02/2012   CREATININE 0.60 03/21/2011   CREATININE 0.75 01/05/2010    BP Readings from Last 3 Encounters:  10/02/12 125/75  06/05/12 123/81  01/31/12 127/77    Assessment: Hypertension control:  controlled  Progress toward goals:  at goal Barriers to meeting goals:  no barriers identified  Plan: Hypertension treatment:  continue current medications - lisinopril 10 and lasix 20 (no HCTZ because of hypercalcemia).  CMET today.

## 2012-10-02 NOTE — Assessment & Plan Note (Signed)
Patient reports she is not interested in repeat audiology exam, as she had 2 ENT MDs review her results, and understand it is likely related to aging.  I agree.  If deterioration of hearing, will re-evaluate.

## 2012-10-02 NOTE — Assessment & Plan Note (Signed)
Check calcium today. Cont mgmt with lasix. Asymptomatic.  See overview for further details.

## 2012-10-03 LAB — HEPATIC FUNCTION PANEL
ALT: 17 U/L (ref 0–35)
AST: 19 U/L (ref 0–37)
Bilirubin, Direct: 0.2 mg/dL (ref 0.0–0.3)
Indirect Bilirubin: 0.3 mg/dL (ref 0.0–0.9)

## 2012-10-23 ENCOUNTER — Other Ambulatory Visit: Payer: Self-pay | Admitting: Internal Medicine

## 2012-10-25 ENCOUNTER — Other Ambulatory Visit: Payer: Self-pay | Admitting: *Deleted

## 2012-10-25 DIAGNOSIS — G5600 Carpal tunnel syndrome, unspecified upper limb: Secondary | ICD-10-CM

## 2012-11-27 ENCOUNTER — Encounter: Payer: 59 | Admitting: Internal Medicine

## 2012-12-09 ENCOUNTER — Other Ambulatory Visit: Payer: Self-pay | Admitting: Internal Medicine

## 2013-02-04 ENCOUNTER — Other Ambulatory Visit: Payer: Self-pay | Admitting: *Deleted

## 2013-02-04 MED ORDER — LISINOPRIL 10 MG PO TABS: ORAL_TABLET | ORAL | Status: DC

## 2013-02-07 ENCOUNTER — Other Ambulatory Visit: Payer: Self-pay | Admitting: Internal Medicine

## 2013-02-11 ENCOUNTER — Other Ambulatory Visit: Payer: Self-pay | Admitting: Internal Medicine

## 2013-02-11 ENCOUNTER — Ambulatory Visit (INDEPENDENT_AMBULATORY_CARE_PROVIDER_SITE_OTHER): Payer: 59 | Admitting: Family Medicine

## 2013-02-11 VITALS — BP 128/80 | Wt 166.0 lb

## 2013-02-11 DIAGNOSIS — E119 Type 2 diabetes mellitus without complications: Secondary | ICD-10-CM

## 2013-02-11 NOTE — Progress Notes (Signed)
Patient presents today for 3 month DM follow-up as part of the employer-sponsored Link to Wellness program. Current regimen includes Lantus and Metformin. Patient also continues on daily ACEi, statin, and ASA. She continues seeing Dr. Milbert Coulter at [facility] internal medicine office, but has not had a recent follow-up. No new medications or health changes at this time. Patient has recently retired from the health system but remains eligible for Link to [employer] given that she now has healthcare coverage through [employer]. This coverage will end in June 2015 and at this time pt will no longer be eligible for participation in Link to Wellness.   Diabetes Assessment: A1c - 9.3 via point-of-care testing (previously 8.1) Patient did not bring meter today and cannot recall recent glucose readings. She reports testing once every other day; however, I expect it is less frequent than reported. She is aware of the need to improve self-monitoring of blood glucose. Also, she left her DM medications/supplies including Lantus and glucose meter in [place] while visiting her daughter. She has not tested or given any insulin in 3 days and expects that her insulin and meter will be returned to her today. Patient remains on Lantus 30 units daily, but I anticipate this being increased at next MD follow-up, after increase in A1c toady from 8.1 to 9.3. Patient continues on Metformin 1000 mg twice daily and has also started Cinnamon tablets twice daily. She denies any symptoms of hypo or hyper-glycemia.  Exercise: No routine exercise at this time. Patient reports that weather conditions and lack of time are her main barriers.  In the past she has been consistent with exercise, but has not been as committed over the past 6 months. Pt does understand the importance of exercise and will attempt to resume.   Diet: No major changes. However, pt is now using, "nutri-bullet", to make meal replacement shakes. Uses a recipe book  for Diabetic patients. Most recipies include spinach, beets, almonds, brocolli, banana, and water. She drinks one shake per day as a dinner replacement. She continues limiting breads and is working to reduce sweets. Patient dose admit that she and her husband have started eating chinese food on a regular basis, I have asked patient to be cautious with this as chinese food can be high in carbs and calories.  Assessment: Patient's DM control has continued to decline over the past three months. A1c has increased from 8.1 to 9.3. She is due for follow-up with Dr. Milbert Coulter. Patient continues to have much room for improvement in both diet and exercise and has set goals for each of these. She is also aware of the need for increased frequency of testing and has agreed to work on this. Of note, patient's weight has decreased consistently over the past year and she will continue to work on this with a goal of 155 lb. At this time, I will have patient return in 2 months vs 3 months due to an increasing A1c.   Plan: 1) Attempt to increase exercise, goal of 5 days per week, at least 30 min each time.  2) Attempt to limit chinese food, limit to once per week max 3) Continue to limit breads 4) Attempt to increase glucose testing to at least once daily in the morning 5) Make appt with MD for follow-up of DM 6) Make appt for mammogram 7) Follow-up in 2 months on Tuesday, May 6th @ 11:15

## 2013-02-18 ENCOUNTER — Other Ambulatory Visit: Payer: Self-pay | Admitting: Internal Medicine

## 2013-02-18 DIAGNOSIS — Z1231 Encounter for screening mammogram for malignant neoplasm of breast: Secondary | ICD-10-CM

## 2013-02-25 ENCOUNTER — Ambulatory Visit (HOSPITAL_COMMUNITY): Payer: 59

## 2013-03-05 ENCOUNTER — Ambulatory Visit (INDEPENDENT_AMBULATORY_CARE_PROVIDER_SITE_OTHER): Payer: 59 | Admitting: Internal Medicine

## 2013-03-05 ENCOUNTER — Encounter: Payer: Self-pay | Admitting: Internal Medicine

## 2013-03-05 VITALS — BP 135/81 | HR 81 | Temp 97.3°F | Wt 172.2 lb

## 2013-03-05 DIAGNOSIS — Z1211 Encounter for screening for malignant neoplasm of colon: Secondary | ICD-10-CM

## 2013-03-05 DIAGNOSIS — Z79899 Other long term (current) drug therapy: Secondary | ICD-10-CM

## 2013-03-05 DIAGNOSIS — E119 Type 2 diabetes mellitus without complications: Secondary | ICD-10-CM

## 2013-03-05 DIAGNOSIS — E210 Primary hyperparathyroidism: Secondary | ICD-10-CM

## 2013-03-05 DIAGNOSIS — I10 Essential (primary) hypertension: Secondary | ICD-10-CM

## 2013-03-05 DIAGNOSIS — E785 Hyperlipidemia, unspecified: Secondary | ICD-10-CM

## 2013-03-05 LAB — POCT GLYCOSYLATED HEMOGLOBIN (HGB A1C): Hemoglobin A1C: 9.1

## 2013-03-05 LAB — GLUCOSE, CAPILLARY: Glucose-Capillary: 116 mg/dL — ABNORMAL HIGH (ref 70–99)

## 2013-03-05 NOTE — Progress Notes (Signed)
Subjective:   Patient ID: Michele Andrews female   DOB: 10/24/1951 62 y.o.   MRN: [id number]  HPI: [name] is a 62 y.o. woman with PMH significant for HTN, DM, HLD and primary hyperparathyroidism presents for routine follow up.   Retired since last visit; no complaints today.  Hb 9.1 --> been working with Lanora Manis (pharmacist); has been giving 35-40 units of lantus since 02/12/13, and sugars have been better controlled (from 30) since 02/12/13. She denies symptoms of hypoglycemia (no diaphoresis, dizziness, SOB, jittery feelings).  She denies symptoms of hyperglycemia including polyuria, polydipsia and pholyphagia.  Past Medical History  Diagnosis Date  . Hypertension   . Diabetes mellitus   . Hypercalcemia   . Hyperlipidemia   . Carpal tunnel syndrome   . Rotator cuff syndrome of right shoulder   . Osteopenia    Current Outpatient Prescriptions  Medication Sig Dispense Refill  . Blood Glucose Monitoring Suppl (TRUETRACK BLOOD GLUCOSE) W/DEVICE KIT Use to check blood sugar 2 times a day       . furosemide (LASIX) 20 MG tablet TAKE 1 TABLET BY MOUTH TWICE DAILY  60 tablet  2  . glucose blood (TRUETRACK TEST) test strip Use to check blood sugar 2 times a day  100 each  6  . ibuprofen (ADVIL,MOTRIN) 800 MG tablet Take 1 tablet (800 mg total) by mouth every 8 (eight) hours. With food, as needed for pain  30 tablet  0  . Insulin Pen Needle 31G X 5 MM MISC 1 each by Does not apply route 3 (three) times daily before meals.  100 each  11  . Lancets (ACCU-CHEK SOFT TOUCH) lancets Use to check blood sugar 2 times a day       . lisinopril (PRINIVIL,ZESTRIL) 10 MG tablet TAKE 1 TABLET BY MOUTH ONCE DAILY  30 tablet  2  . metFORMIN (GLUCOPHAGE) 1000 MG tablet TAKE 1 TABLET BY MOUTH 2 TIMES DAILY WITH A MEAL.  60 tablet  6  . pravastatin (PRAVACHOL) 40 MG tablet TAKE 1 TABLET (40 MG TOTAL) BY MOUTH DAILY.  30 tablet  PRN   No current facility-administered medications for this visit.    Family History  Problem Relation Age of Onset  . Hypertension Mother   . Cancer Mother    History   Social History  . Marital Status: Married    Spouse Name: N/A    Number of Children: N/A  . Years of Education: N/A   Social History Main Topics  . Smoking status: Never Smoker   . Smokeless tobacco: None  . Alcohol Use: No  . Drug Use: No  . Sexually Active: None   Other Topics Concern  . None   Social History Narrative  . None   Review of Systems: Constitutional: Denies fever, chills, diaphoresis, appetite change and fatigue.  HEENT: Denies photophobia, eye pain, redness, hearing loss, ear pain, congestion, sore throat, rhinorrhea, sneezing, mouth sores, trouble swallowing, neck pain, neck stiffness and tinnitus.   Respiratory: Denies SOB, DOE, cough, chest tightness,  and wheezing.   Cardiovascular: Denies chest pain, palpitations and leg swelling.  Gastrointestinal: Denies nausea, vomiting, abdominal pain, diarrhea, constipation, blood in stool and abdominal distention.  Genitourinary: Denies dysuria, urgency, frequency, hematuria, flank pain and difficulty urinating.  Musculoskeletal: Denies myalgias, back pain, joint swelling, arthralgias and gait problem.  Skin: Denies pallor, rash and wound.  Neurological: Denies dizziness, seizures, syncope, weakness, light-headedness, numbness and headaches.   Psychiatric/Behavioral: Denies suicidal ideation, mood  changes, confusion, nervousness, sleep disturbance and agitation  Objective:  Physical Exam: Filed Vitals:   03/05/13 1619  BP: 135/81  Pulse: 81  Temp: 97.3 F (36.3 C)  TempSrc: Oral  Weight: 172 lb 3.2 oz (78.109 kg)  SpO2: 98%   Constitutional: Vital signs reviewed.  Patient is a well-developed and well-nourished woman in no acute distress and cooperative with exam Head: Normocephalic and atraumatic Mouth: no erythema or exudates, MMM Eyes: PERRL, EOMI, conjunctivae normal, No scleral icterus.  Neck:  Supple, Trachea midline normal ROM, No JVD, mass, thyromegaly, or carotid bruit present.  Cardiovascular: RRR, S1 normal, S2 normal, no MRG, pulses symmetric and intact bilaterally Pulmonary/Chest: CTAB, no wheezes, rales, or rhonchi Abdominal: Soft. Non-tender, non-distended, bowel sounds are normal, no masses, organomegaly, or guarding present.  Musculoskeletal: No joint deformities, erythema, or stiffness, ROM full and no nontender Neurological: A&O x3, Strength is normal and symmetric bilaterally, cranial nerve II-XII are grossly intact, no focal motor deficit, sensory intact to light touch bilaterally.  Skin: Warm, dry and intact. No rash, cyanosis, or clubbing.  Psychiatric: Normal mood and affect. speech and behavior is normal. Judgment and thought content normal. Cognition and memory are normal.   Assessment & Plan:   Case and care dicussed with Dr. Josem Kaufmann. Patient to return in 3 months for DM follow up. Please see problem oriented charting for further details.

## 2013-03-05 NOTE — Patient Instructions (Signed)
General Instructions: -Keep up the great work with your blood pressure! We have some work to do with your diabetes.  Let's go up to 40 units of lantus every night before bed.  -We need to schedule you for your colonoscopy - Please let us know where you had the last one.  Please be sure to bring all of your medications with you to every visit.  Should you have any new or worsening symptoms, please be sure to call the clinic at [phone].   Treatment Goals:  Goals (1 Years of Data) as of 03/05/13         As of Today 02/11/13 10/02/12 06/05/12 01/31/12     Blood Pressure    . Blood Pressure < 130/80  135/81 128/80 125/75 123/81 127/77     Result Component    . HEMOGLOBIN A1C < 7.0  9.1  8.1 7.3     . LDL CALC < 100            Progress Toward Treatment Goals:  Treatment Goal 03/05/2013  Hemoglobin A1C deteriorated  Blood pressure at goal    Self Care Goals & Plans:  Self Care Goal 03/05/2013  Manage my medications take my medicines as prescribed; refill my medications on time  Monitor my health bring my glucose meter and log to each visit; keep track of my blood glucose; check my feet daily  Eat healthy foods eat foods that are low in salt; eat baked foods instead of fried foods    Home Blood Glucose Monitoring 03/05/2013  Check my blood sugar 2 times a day  When to check my blood sugar before breakfast; at bedtime     Care Management & Community Referrals:

## 2013-03-06 LAB — BASIC METABOLIC PANEL
CO2: 27 mEq/L (ref 19–32)
Calcium: 10.9 mg/dL — ABNORMAL HIGH (ref 8.4–10.5)
Creat: 0.71 mg/dL (ref 0.50–1.10)

## 2013-03-06 LAB — LIPID PANEL: HDL: 50 mg/dL (ref >39)

## 2013-03-06 NOTE — Assessment & Plan Note (Signed)
Repeat Ca today stable and patient remains asx.

## 2013-03-06 NOTE — Assessment & Plan Note (Signed)
BP Readings from Last 3 Encounters:  03/05/13 135/81  02/11/13 128/80  10/02/12 125/75    Lab Results  Component Value Date   NA 138 03/05/2013   K 4.2 03/05/2013   CREATININE 0.71 03/05/2013    Assessment:  Blood pressure control: controlled  Progress toward BP goal:  at goal  Plan:  Medications:  continue current medications --> lisinopril 10, lasix 20  Educational resources provided: brochure;video  Self management tools provided: home blood pressure logbook

## 2013-03-06 NOTE — Assessment & Plan Note (Signed)
Lab Results  Component Value Date   HGBA1C 9.1 03/05/2013   HGBA1C 8.1 10/02/2012   HGBA1C 7.3 06/05/2012     Assessment:  Diabetes control: poor control (HgbA1C >9%)  Progress toward A1C goal:  deteriorated  Plan:  Medications:  Increase lantus to 40u qHS  Home glucose monitoring:   Frequency: 2 times a day   Timing: before breakfast;at bedtime  Instruction/counseling given: reminded to bring blood glucose meter & log to each visit and reminded to bring medications to each visit  Educational resources provided: brochure  Self management tools provided: home glucose logbook

## 2013-03-06 NOTE — Assessment & Plan Note (Signed)
Repeat lipid profile. Continue pravastatin.

## 2013-03-10 ENCOUNTER — Other Ambulatory Visit: Payer: Self-pay | Admitting: Internal Medicine

## 2013-03-10 ENCOUNTER — Ambulatory Visit (HOSPITAL_COMMUNITY)
Admission: RE | Admit: 2013-03-10 | Discharge: 2013-03-10 | Disposition: A | Discharge status: Home / Self Care | Payer: 59 | Source: Ambulatory Visit | Attending: Family Medicine | Admitting: Family Medicine

## 2013-03-10 DIAGNOSIS — Z1231 Encounter for screening mammogram for malignant neoplasm of breast: Secondary | ICD-10-CM | POA: Insufficient documentation

## 2013-04-08 ENCOUNTER — Other Ambulatory Visit: Payer: Self-pay | Admitting: *Deleted

## 2013-04-08 MED ORDER — GLUCOSE BLOOD VI STRP: ORAL_STRIP | Status: DC

## 2013-04-29 ENCOUNTER — Other Ambulatory Visit: Payer: Self-pay | Admitting: Internal Medicine

## 2013-05-01 ENCOUNTER — Telehealth: Payer: Self-pay | Admitting: *Deleted

## 2013-05-01 NOTE — Telephone Encounter (Signed)
Pt called stating she would like an excuse from [employer] on 6/26. She feels with her Diabetes it would be hard to serve. Will you write an excuse?

## 2013-05-06 ENCOUNTER — Other Ambulatory Visit: Payer: Self-pay | Admitting: *Deleted

## 2013-05-06 MED ORDER — INSULIN GLARGINE 100 UNIT/ML ~~LOC~~ SOLN: 40.0000 [IU] | Freq: Every day | SUBCUTANEOUS | Status: DC

## 2013-05-06 NOTE — Telephone Encounter (Signed)
I don't think her DM qualifies her to be excused from jury duty - she is only on insulin once a day at night.

## 2013-05-06 NOTE — Telephone Encounter (Signed)
Pt informed

## 2013-05-07 ENCOUNTER — Ambulatory Visit (INDEPENDENT_AMBULATORY_CARE_PROVIDER_SITE_OTHER): Payer: Self-pay | Admitting: Family Medicine

## 2013-05-07 VITALS — BP 118/74 | Wt 172.0 lb

## 2013-05-07 DIAGNOSIS — E119 Type 2 diabetes mellitus without complications: Secondary | ICD-10-CM

## 2013-05-07 NOTE — Progress Notes (Signed)
Patient presents today for 3 month diabetes follow-up as part of the employer-sponsored Link to Wellness program. Current diabetes regimen includes Metformin, Lantus, and most recently cinnamon. Patient also continues on daily ACEi and statin. Most recent MD follow-up was this past March. At this time A1c was 9.1 and Lantus was increased to 40 units. No other med changes or major health changes at this time.   Diabetes Assessment: Type of Diabetes: Type 2; checks feet daily; takes medications as prescribed; MD managing Diabetes Dr. Milbert Coulter, MD; takes an aspirin a day; checks blood glucose once daily; A1c 7.5 (prev 9.1) Other Diabetes History: Current med regimen includes Metformin 1000 mg twice daily and Lantus 40 units daily. Lantus dose was increased from 30 units to 40 units daily at most recent MD follow-up in late March. Patient has also started taking a cinnamon and chromium tablet daily. She has tolerated Lantus dose increase and has not experienced hypoglycemia. Patient does maintain good medication compliance. Patient did bring meter today and is currently testing once daily, fasting, this is an improvement for this patient. She has also started keeping a glucose log and will attempt to increase testing to fasting and at bedtime. Fastings run 85-130s, a few highs of 140-160 due to wrong dietary choices. Hypoglycemia frequency is rare. Patient does demonstrate appropriate correction of hypoglycemia. Patient denies signs and symptoms of neuropathy including numbness/tingling/burning and symptoms of foot infection. Patient is not due for yearly eye exam.  Lifestyle Factors: Exercise - No routine exercise at this time. Patient's physical activity is limited to gardening at this time. She would like to start walking but does not have a partner to walk with and does not feel safe walking the road alone. She does know of a nice park that she is comfortable walking at and will try this soon. She has also  purchased a workout video and would also like to start using this. She has set a personal goal of exercising (either video or walking) 5 times per week.  Diet - Patient is making significant improvements. She is now avoiding fried foods and continues trying to limit breads. She is consuming plenty of vegetables and continues making smoothies with her new ","bullet"," which include spinach, brocolli, kale, strawberries, and almonds.  Assessment: Patient's DM control has improved over the past three months. A1c has decreased from 9.1 to 7.5. Althought patient has started making some positive changes, she continues to have room for improvement in both diet and exercise and has set goals for each of these. She has increased frequency of testing and is now doing so once daily and is keeping a glucose log. Of note, patient's weight has increased slightly over the past 3 months and she will work on this with a weight loss goal of 5 lb. Patient will return in 3 months for LTW follow-up..  Plan: 1) Weight loss goal of 5 lbs 2) Continue making healthy dietary choices and avoid eating after 8 pm 3) Attempt to exercise 5 days per week, using either video or walking (or both) 4) Continue testing and keeping a glucose log 5) Follow-up in 3 months on Wednesday August 27th @ 11:15 am  A1c today 7.5!! (down from 9.1)

## 2013-06-02 NOTE — Progress Notes (Signed)
Patient ID: Marlyne U Wamble, female   DOB: 08/21/1951, 62 y.o.   MRN: [id number] ATTENDING PHYSICIAN NOTE: I have reviewed the chart and agree with the plan as detailed above. Alyze Lauf MD Pager [phone]  

## 2013-06-09 ENCOUNTER — Other Ambulatory Visit: Payer: Self-pay | Admitting: Internal Medicine

## 2013-06-18 ENCOUNTER — Other Ambulatory Visit: Payer: Self-pay | Admitting: *Deleted

## 2013-06-18 MED ORDER — IBUPROFEN 800 MG PO TABS: 800.0000 mg | ORAL_TABLET | Freq: Three times a day (TID) | ORAL | Status: DC | PRN

## 2013-06-19 ENCOUNTER — Other Ambulatory Visit: Payer: Self-pay

## 2013-07-08 ENCOUNTER — Other Ambulatory Visit: Payer: Self-pay | Admitting: Internal Medicine

## 2013-08-04 ENCOUNTER — Other Ambulatory Visit: Payer: Self-pay | Admitting: Internal Medicine

## 2013-08-18 ENCOUNTER — Other Ambulatory Visit: Payer: Self-pay | Admitting: Internal Medicine

## 2013-09-10 ENCOUNTER — Ambulatory Visit (INDEPENDENT_AMBULATORY_CARE_PROVIDER_SITE_OTHER): Payer: Self-pay | Admitting: Family Medicine

## 2013-09-10 ENCOUNTER — Encounter: Payer: Self-pay | Admitting: Internal Medicine

## 2013-09-10 ENCOUNTER — Ambulatory Visit (INDEPENDENT_AMBULATORY_CARE_PROVIDER_SITE_OTHER): Payer: 59 | Admitting: Internal Medicine

## 2013-09-10 VITALS — BP 138/86 | Wt 172.0 lb

## 2013-09-10 VITALS — BP 137/81 | HR 97 | Temp 98.6°F | Ht 62.5 in | Wt 175.6 lb

## 2013-09-10 DIAGNOSIS — Z299 Encounter for prophylactic measures, unspecified: Secondary | ICD-10-CM

## 2013-09-10 DIAGNOSIS — Z23 Encounter for immunization: Secondary | ICD-10-CM

## 2013-09-10 DIAGNOSIS — E119 Type 2 diabetes mellitus without complications: Secondary | ICD-10-CM

## 2013-09-10 DIAGNOSIS — I10 Essential (primary) hypertension: Secondary | ICD-10-CM

## 2013-09-10 MED ORDER — FUROSEMIDE 40 MG PO TABS: 40.0000 mg | ORAL_TABLET | Freq: Every day | ORAL | Status: DC

## 2013-09-10 NOTE — Patient Instructions (Addendum)
General Instructions:  -Today you received your flu shot; I am also checking your calcium level -You may start taking lasix 40mg  once daily instead of 20mg  twice daily (for your convenience) -No change in diabetes regimen - continue lantus 40u daily and metformin 1000mg  twice daily - continue to work on your diet and exercise, you're doing great!  Please be sure to bring all of your medications with you to every visit.  Should you have any new or worsening symptoms, please be sure to call the clinic at [phone].   Treatment Goals:  Goals (1 Years of Data) as of 09/10/13         As of Today 05/07/13 03/05/13 02/11/13 10/02/12     Blood Pressure    . Blood Pressure < 130/80  137/81 118/74 135/81 128/80 125/75     Result Component    . HEMOGLOBIN A1C < 7.0    9.1  8.1    . LDL CALC < 100    87        Progress Toward Treatment Goals:  Treatment Goal 09/10/2013  Hemoglobin A1C improved  Blood pressure at goal    Self Care Goals & Plans:  Self Care Goal 09/10/2013  Manage my medications take my medicines as prescribed  Monitor my health keep track of my blood glucose; bring my glucose meter and log to each visit  Eat healthy foods -  Be physically active take a walk every day  Meeting treatment goals maintain the current self-care plan    Home Blood Glucose Monitoring 09/10/2013  Check my blood sugar once a day  When to check my blood sugar before breakfast     Care Management & Community Referrals:

## 2013-09-10 NOTE — Assessment & Plan Note (Addendum)
Lab Results  Component Value Date   HGBA1C 9.1 03/05/2013   HGBA1C 8.1 10/02/2012   HGBA1C 7.3 06/05/2012     Assessment: Diabetes control: good control (HgbA1C at goal) - 7.4 today (checked outside of clinic) Progress toward A1C goal:  improved  Plan: Medications:  continue current medications - lantus 40 qhs , metformin 1000 bid; encouraged diet and exercise modifications (patient motivated and does not want to increase medication at this time) Home glucose monitoring: Frequency: once a day Timing: before breakfast Instruction/counseling given: reminded to bring blood glucose meter & log to each visit and reminded to bring medications to each visit

## 2013-09-10 NOTE — Progress Notes (Signed)
Subjective:   Patient ID: Michele Andrews female   DOB: 08-14-1951 62 y.o.   MRN: [id number]  HPI: MicheleJack ILEA Andrews is a 62 y.o. y.o. woman with PMH significant for HTN, DM, HLD and primary hyperparathyroidism presents for routine follow up. No complaints except mild left ankle pain that began to act up about a month ago but improving.  No obvious trauma.   A1c 7.4 today (checked by [facility] pharmacist - Lanora Manis Had done "bad" for a while, with fried foods but now restarted fruit/veggie smoothies and plans to restart walking more.  She denies symptoms of hypoglycemia (no diaphoresis, dizziness, SOB, jittery feelings). She denies symptoms of hyperglycemia including polyuria, polydipsia and polyphagia.  Taking lantus 40uqHS; takes CBGs every morning (fasting) but did not bring meter with her today.  Regarding colonoscopy, she reports that she has had one in the last 10 years and but needs to find out by who.    Review of Systems: Constitutional: Denies fever, chills, diaphoresis, appetite change and fatigue.  HEENT: Denies photophobia, eye pain, redness, hearing loss, ear pain, congestion, sore throat, rhinorrhea, sneezing, mouth sores, trouble swallowing, neck pain, neck stiffness and tinnitus.  Respiratory: Denies SOB, DOE, cough, chest tightness, and wheezing.  Cardiovascular: Denies chest pain, palpitations and leg swelling.  Gastrointestinal: Denies nausea, vomiting, abdominal pain, diarrhea, constipation,blood in stool and abdominal distention.  Genitourinary: Denies dysuria, urgency, frequency, hematuria, flank pain and difficulty urinating.  Musculoskeletal: Denies back pain, joint swelling, and gait problem.  Skin: Denies pallor, rash and wound.  Neurological: Denies dizziness, seizures, syncope, weakness, lightheadedness, numbness and headaches.    Past Medical History  Diagnosis Date  . Hypertension   . Diabetes mellitus   . Hypercalcemia   . Hyperlipidemia   . Carpal tunnel  syndrome   . Rotator cuff syndrome of right shoulder   . Osteopenia    Current Outpatient Prescriptions  Medication Sig Dispense Refill  . Blood Glucose Monitoring Suppl (TRUETRACK BLOOD GLUCOSE) W/DEVICE KIT Use to check blood sugar 2 times a day       . furosemide (LASIX) 20 MG tablet TAKE 1 TABLET BY MOUTH TWICE DAILY  60 tablet  3  . glucose blood (TRUETRACK TEST) test strip Use to check blood sugar 2 times a day  100 each  6  . ibuprofen (ADVIL,MOTRIN) 800 MG tablet Take 1 tablet (800 mg total) by mouth every 8 (eight) hours as needed for pain.  30 tablet  5  . insulin glargine (LANTUS) 100 UNIT/ML injection Inject 0.4 mLs (40 Units total) into the skin at bedtime.  10 mL  11  . Insulin Pen Needle 31G X 5 MM MISC 1 each by Does not apply route 3 (three) times daily before meals.  100 each  11  . Lancets (ACCU-CHEK SOFT TOUCH) lancets Use to check blood sugar 2 times a day       . lisinopril (PRINIVIL,ZESTRIL) 10 MG tablet TAKE 1 TABLET BY MOUTH ONCE DAILY  30 tablet  2  . metFORMIN (GLUCOPHAGE) 1000 MG tablet TAKE 1 TABLET BY MOUTH 2 TIMES DAILY WITH A MEAL.  60 tablet  6  . pravastatin (PRAVACHOL) 40 MG tablet TAKE 1 TABLET BY MOUTH ONCE DAILY  30 tablet  3   No current facility-administered medications for this visit.   Family History  Problem Relation Age of Onset  . Hypertension Mother   . Cancer Mother    History   Social History  . Marital Status: Married  Spouse Name: N/A    Number of Children: N/A  . Years of Education: N/A   Social History Main Topics  . Smoking status: Never Smoker   . Smokeless tobacco: None  . Alcohol Use: No  . Drug Use: No  . Sexual Activity: None   Other Topics Concern  . None   Social History Narrative  . None   Objective:  Physical Exam: Filed Vitals:   09/10/13 1525  BP: 137/81  Pulse: 97  Temp: 98.6 F (37 C)  TempSrc: Oral  Height: 5' 2.5" (1.588 m)  Weight: 175 lb 9.6 oz (79.652 kg)  SpO2: 98%   HEENT: PERRL, EOMI,  no scleral icterus Cardiac: RRR, no rubs, murmurs or gallops Pulm: clear to auscultation bilaterally, moving normal volumes of air Abd: soft, nontender, nondistended, BS normoactive Ext: warm and well perfused, no pedal edema Neuro: alert and oriented X3, cranial nerves II-XII grossly intact  Assessment & Plan:   Case and care discussed with Dr. Josem Kaufmann.  Please see problem oriented charting for further details. Patient to return in 3 months for DM follow up.

## 2013-09-10 NOTE — Assessment & Plan Note (Signed)
BP Readings from Last 3 Encounters:  09/10/13 137/81  05/07/13 118/74  03/05/13 135/81    Lab Results  Component Value Date   NA 138 03/05/2013   K 4.2 03/05/2013   CREATININE 0.71 03/05/2013    Assessment: Blood pressure control: controlled Progress toward BP goal:  at goal  Plan: Medications:  continue current medications - lisinopril 10,lasix 40 daily (was taking 20 bid, changed for convenience sake)

## 2013-09-10 NOTE — Assessment & Plan Note (Signed)
Flu shot today 

## 2013-09-11 LAB — BASIC METABOLIC PANEL
BUN: 9 mg/dL (ref 6–23)
Potassium: 3.8 mEq/L (ref 3.5–5.3)
Sodium: 139 mEq/L (ref 135–145)

## 2013-09-11 NOTE — Progress Notes (Signed)
Patient presents today for 3 month diabetes follow-up as part of the employer-sponsored Link to Wellness program. Current diabetes regimen includes Lantus, Metformin, and Cinnamon tablets. Patient also continues on daily ASA, ACEi, and statin. Most recent MD follow-up was last fall. Patient has a pending appt for today. No med changes or major health changes at this time. Of note, patient has experienced some pain and swelling of the left ankle, she will discuss this with MD today.  Diabetes Assessment: Type of Diabetes: Type 2; Sees Diabetes provider 2 times per year; checks feet daily; takes medications as prescribed; uses glucometer; MD managing Diabetes Dr. Milbert Coulter, MD; takes an aspirin a day; checks blood glucose once daily; Lowest CBG 117; Highest CBG 150s; A1c 7.4 Other Diabetes History: Current med regimen includes Metformin 1000 mg twice daily and Lantus 40 units daily. Patient also continues taking a cinnamon and chromium tablet daily. Patient does maintain good medication compliance. Patient did bring meter today and is currently testing once daily, fasting, this is an improvement for this patient. Fastings run 120-140s, a few highs of 150s due to wrong dietary choices or eating late. Lowest reading of 117. Hypoglycemia frequency is rare. Patient does demonstrate appropriate correction of hypoglycemia. Patient denies signs and symptoms of neuropathy including numbness/tingling/burning and symptoms of foot infection. Patient is due for yearly eye exam and already has appt scheduled for this month.   Lifestyle Factors: Exercise - No routine exercise at this time. She understands the importance of physical activity and would like to start walking. Lately she has experienced some left ankle pain but reports that phsycial activity actually helps it improve. She also has a workout video and would like to start using this. She has set a personal goal of exercising (either video or walking) 2 times per  week.  Diet - Diet has deteriorated over past three months. Patient is now eating later in the evening, but understands that she needs to resume eating before 6 pm. she has now purchased a ninja blender and is making fruit/veg smoothies, and estimates this equals about 4 servings of fruits/veg per day. Unfortunately she is now indulging in fried foods almost every other day, such as fried chicken, fast food, burgers, fries, etc. She will attempt to limit fried foods to once weekly.  Assessment: Patient's DM has remained controlled over the past three months. A1c has decreased 7.5 to 7.4. Unfortunately patient has made no changes to exercise and has allowed diet to deteriorate over hte past three months. However, she is motivated to make changes and has set goals to do so today. She has increased frequency of testing and is now testing once daily consistently. Patient's weight has remained the same but she has set a weight loss goal of 5 lb. Patient will return in 3 months for LTW follow-up.  Plan: 1) Limit fried foods to once weekly - select a specific day of the week to treat yourself 2) Limit sodium and all fast foods 3) Attempt to resume exercise with goal of walking once weekly and doing exercise video once weekly 4) Continue testing at least once daily. 5) 5 lb weight loss goal by next visit 6) Follow-up in 3 months on Wednesday December 31st @ 1:30 pm

## 2013-09-15 NOTE — Progress Notes (Signed)
Case discussed with Dr. Sharda soon after the resident saw the patient.  We reviewed the resident's history and exam and pertinent patient test results.  I agree with the assessment, diagnosis and plan of care documented in the resident's note. 

## 2013-09-18 ENCOUNTER — Other Ambulatory Visit: Payer: Self-pay | Admitting: Internal Medicine

## 2013-11-10 ENCOUNTER — Other Ambulatory Visit: Payer: Self-pay | Admitting: Internal Medicine

## 2013-11-10 MED ORDER — FUROSEMIDE 40 MG PO TABS: 40.0000 mg | ORAL_TABLET | Freq: Every day | ORAL | Status: DC

## 2013-11-24 ENCOUNTER — Other Ambulatory Visit: Payer: Self-pay | Admitting: *Deleted

## 2013-11-25 MED ORDER — INSULIN PEN NEEDLE 31G X 5 MM MISC: 1.0000 | Freq: Three times a day (TID) | Status: DC

## 2013-11-26 ENCOUNTER — Other Ambulatory Visit: Payer: Self-pay | Admitting: *Deleted

## 2013-11-27 MED ORDER — ACCU-CHEK SOFT TOUCH LANCETS MISC: Status: DC

## 2013-11-27 MED ORDER — INSULIN PEN NEEDLE 31G X 5 MM MISC: 1.0000 | Freq: Three times a day (TID) | Status: DC

## 2013-12-16 ENCOUNTER — Encounter: Payer: Self-pay | Admitting: Internal Medicine

## 2013-12-22 NOTE — Progress Notes (Signed)
Patient ID: Michele Andrews, female   DOB: 1951/09/20, 63 y.o.   MRN: [id number] ATTENDING PHYSICIAN NOTE: I have reviewed the chart and agree with the plan as detailed above. Dorcas Mcmurray MD Pager [phone]

## 2013-12-29 ENCOUNTER — Other Ambulatory Visit: Payer: Self-pay | Admitting: Internal Medicine

## 2013-12-30 ENCOUNTER — Ambulatory Visit (INDEPENDENT_AMBULATORY_CARE_PROVIDER_SITE_OTHER): Payer: Self-pay | Admitting: Family Medicine

## 2013-12-30 VITALS — BP 132/78 | Wt 174.0 lb

## 2013-12-30 DIAGNOSIS — E119 Type 2 diabetes mellitus without complications: Secondary | ICD-10-CM

## 2013-12-30 NOTE — Progress Notes (Signed)
Patient presents today for 3 month diabetes follow-up as part of the employer-sponsored Link to Wellness program. Current diabetes regimen includes Metformin and Lantus. Patient also continues on daily ACEi and statin. Most recent MD follow-up was October 2014. Patient has a pending appt for 6 month follow-up in March. No med changes or major health changes at this time.   Diabetes Assessment: Type of Diabetes: Type 2; Sees Diabetes provider 2 times per year; checks feet daily; takes medications as prescribed; uses glucometer; MD managing Diabetes Dr. Victorio Palm, MD; takes an aspirin a day; checks blood glucose once daily; 7 day CBG average 117; A1c 8.3 (prev 7.1) Other Diabetes History: Current med regimen includes Metformin 1000 mg twice daily and Lantus 40 units daily. Patient has discontinued taking a cinnamon and chromium tablet daily. Patient does maintain good medication compliance. Patient did bring meter today and is currently testing once daily, fasting. However, there are only 3 readings on meter, as her previous meter broke and pateint was sent a new one this past friday. Average glucose is 117 over the past 4 days. Hypoglycemia frequency is rare. Patient does demonstrate appropriate correction of hypoglycemia. Patient denies signs and symptoms of neuropathy including numbness/tingling/burning and symptoms of foot infection. Patient is not due for yearly eye exam and had appt this past December. Dental exam in December as well.   Lifestyle Factors: Exercise - No routine exercise at this time. She understands the importance of physical activity and would like to start walking, but has been limited by time lately. Her husband has been ill and she has been taking care of him. She also has been working extra hours to help cover vacations. She was experiencing some left ankle pain but reports that it has since improved and is no longer an issue. A personal goal of exercising once week has been set.  Diet  - Diet has deteriorated over past three months. Patient has enjoyed holiday foods and has not been eating as many fruits/veg as she once was. Her ninja blender broke and she has not yet replaced it, but would like to do so soon as this helped her eat more fruits/veg. Patient has limited fried foods to twice per week (was eating nearly daily) and will attempt to maintain this.  Assessment: Patient's DM has remained controlled over the past three months. A1c has decreased 7.5 to 7.4. Unfortunately patient has made no changes to exercise and has allowed diet to deteriorate over hte past three months. However, she is motivated to make changes and has set goals to do so today. She has increased frequency of testing and is now testing once daily consistently. Patient's weight has remained the same but she has set a weight loss goal of 5 lb. Patient will return in 3 months for LTW follow-up.  Plan: 1) Continue making healthy dietary choices and resume drinking fruit/veg smoothies as a meal replacement 2) Continue to limit fried foods and fast foods 3) Resume exercise at least once per week 4) Continue testing 5) Follow-up in 3 months on Tuesday April 21st @ 3:00 pm

## 2014-02-17 ENCOUNTER — Other Ambulatory Visit: Payer: Self-pay | Admitting: Internal Medicine

## 2014-03-04 ENCOUNTER — Ambulatory Visit (INDEPENDENT_AMBULATORY_CARE_PROVIDER_SITE_OTHER): Payer: 59 | Admitting: Internal Medicine

## 2014-03-04 ENCOUNTER — Encounter: Payer: Self-pay | Admitting: Internal Medicine

## 2014-03-04 VITALS — BP 123/83 | HR 98 | Temp 99.0°F | Wt 176.2 lb

## 2014-03-04 DIAGNOSIS — Z299 Encounter for prophylactic measures, unspecified: Secondary | ICD-10-CM

## 2014-03-04 DIAGNOSIS — E119 Type 2 diabetes mellitus without complications: Secondary | ICD-10-CM

## 2014-03-04 DIAGNOSIS — I10 Essential (primary) hypertension: Secondary | ICD-10-CM

## 2014-03-04 DIAGNOSIS — K219 Gastro-esophageal reflux disease without esophagitis: Secondary | ICD-10-CM

## 2014-03-04 DIAGNOSIS — E210 Primary hyperparathyroidism: Secondary | ICD-10-CM

## 2014-03-04 DIAGNOSIS — E785 Hyperlipidemia, unspecified: Secondary | ICD-10-CM

## 2014-03-04 DIAGNOSIS — Z0000 Encounter for general adult medical examination without abnormal findings: Secondary | ICD-10-CM

## 2014-03-04 DIAGNOSIS — Z1211 Encounter for screening for malignant neoplasm of colon: Secondary | ICD-10-CM

## 2014-03-04 LAB — POCT GLYCOSYLATED HEMOGLOBIN (HGB A1C): Hemoglobin A1C: 7.3

## 2014-03-04 LAB — GLUCOSE, CAPILLARY: GLUCOSE-CAPILLARY: 98 mg/dL (ref 70–99)

## 2014-03-04 MED ORDER — OMEPRAZOLE 20 MG PO CPDR: 20.0000 mg | DELAYED_RELEASE_CAPSULE | Freq: Every day | ORAL | Status: DC

## 2014-03-04 MED ORDER — LANTUS SOLOSTAR 100 UNIT/ML ~~LOC~~ SOPN: 42.0000 [IU] | PEN_INJECTOR | Freq: Every day | SUBCUTANEOUS | Status: DC

## 2014-03-04 MED ORDER — PRAVASTATIN SODIUM 40 MG PO TABS: ORAL_TABLET | ORAL | Status: DC

## 2014-03-04 NOTE — Patient Instructions (Signed)
General Instructions:  -Regarding your reflux symptoms, lets try omeprazole 20mg  daily before breakfast x 3 months, if this dose is not sufficient, let me know, we may need to increase it  -Regarding your diabetes, let's increase your Lantus to 42u before bed, keep checking your sugars as you have been  -I have placed a referral for your colonoscopy, please have this done  Please be sure to bring all of your medications with you to every visit.  Should you have any new or worsening symptoms, please be sure to call the clinic at [phone].   Treatment Goals:  Goals (1 Years of Data) as of 03/04/14         As of Today 12/30/13 09/11/13 09/10/13 05/07/13     Blood Pressure    . Blood Pressure < 130/80  123/83 132/78 138/86 137/81 118/74     Result Component    . HEMOGLOBIN A1C < 7.0  7.3        . LDL CALC < 100            Progress Toward Treatment Goals:  Treatment Goal 03/04/2014  Hemoglobin A1C improved  Blood pressure at goal    Self Care Goals & Plans:  Self Care Goal 03/04/2014  Manage my medications take my medicines as prescribed  Monitor my health -  Eat healthy foods eat more vegetables  Be physically active find an activity I enjoy; find a convenient safe place to exercise  Meeting treatment goals maintain the current self-care plan    Home Blood Glucose Monitoring 03/04/2014  Check my blood sugar once a day  When to check my blood sugar before breakfast

## 2014-03-04 NOTE — Assessment & Plan Note (Signed)
Repeat Ca with albumin today. Patient remains asymptomatic.

## 2014-03-04 NOTE — Assessment & Plan Note (Signed)
Had not been taking statin - refilled pravastatin. Lipid profile today.

## 2014-03-04 NOTE — Progress Notes (Signed)
Subjective:   Patient ID: Michele Andrews female   DOB: 05-31-1951 63 y.o.   MRN: [id number]  HPI: [name] is a 63 y.o. woman with PMH significant for HTN, DM, HLD and primary hyperparathyroidism presents for routine follow up.  I last saw pt on 09/10/14 for DM follow up  Taking Lantus 40u qHS, last met with pharmacist about 1-2 weeks ago. Checks CBGs daily or every other day.  No hypoglycemic episodes. Highest CBGs 200ish after eating, otherwise 150s fasting. Lowest CBG 105-110s.  Has cut down on carbs (bread/butter).    Granddaughter in hospital at [facility] due to sickle cell, crohns and liver disease, so significantly stressed.  Also recently lost best friend --> service was Sunday   Review of Systems: Constitutional: Denies fever, chills, diaphoresis, appetite change and fatigue.  HEENT: Denies photophobia, eye pain, redness, hearing loss, ear pain, congestion, sore throat, rhinorrhea, sneezing, mouth sores, trouble swallowing, neck pain, neck stiffness and tinnitus.  Respiratory: Denies SOB, DOE, cough, chest tightness, and wheezing.  Cardiovascular: Denies chest pain, palpitations  Gastrointestinal: Denies nausea, vomiting, abdominal pain, diarrhea, constipation,blood in stool and abdominal distention. Occ acid reflux sx - wake up like choking with acidic taste in mouth, burning sensation Genitourinary: Denies dysuria, urgency, frequency, hematuria, flank pain and difficulty urinating.  Musculoskeletal: Denies myalgias, back pain, joint swelling, and gait problem. Occ joint pain, better with ibuprofen Skin: Denies pallor, rash and wound.  Neurological: Denies dizziness, seizures, syncope, weakness, lightheadedness, numbness and headaches.  Hematological: No s/s of bleeding   Past Medical History  Diagnosis Date  . Hypertension   . Diabetes mellitus   . Hypercalcemia   . Hyperlipidemia   . Carpal tunnel syndrome   . Rotator cuff syndrome of right shoulder   .  Osteopenia    Current Outpatient Prescriptions  Medication Sig Dispense Refill  . Blood Glucose Monitoring Suppl (TRUETRACK BLOOD GLUCOSE) W/DEVICE KIT Use to check blood sugar 2 times a day       . furosemide (LASIX) 40 MG tablet Take 1 tablet (40 mg total) by mouth daily.  60 tablet  3  . glucose blood (TRUETRACK TEST) test strip Use to check blood sugar 2 times a day  100 each  6  . ibuprofen (ADVIL,MOTRIN) 800 MG tablet Take 1 tablet (800 mg total) by mouth every 8 (eight) hours as needed for pain.  30 tablet  5  . Insulin Pen Needle 31G X 5 MM MISC 1 each by Does not apply route 3 (three) times daily before meals.  100 each  5  . Lancets (ACCU-CHEK SOFT TOUCH) lancets Use to check blood sugar 2 times a day  100 each  5  . LANTUS SOLOSTAR 100 UNIT/ML Solostar Pen INJECT 40 UNITS INTO THE SKIN AT BEDTIME.  10 mL  11  . lisinopril (PRINIVIL,ZESTRIL) 10 MG tablet TAKE 1 TABLET BY MOUTH ONCE DAILY  30 tablet  11  . metFORMIN (GLUCOPHAGE) 1000 MG tablet TAKE 1 TABLET BY MOUTH 2 TIMES DAILY WITH A MEAL.  60 tablet  11  . pravastatin (PRAVACHOL) 40 MG tablet TAKE 1 TABLET BY MOUTH ONCE DAILY  30 tablet  5   No current facility-administered medications for this visit.   Family History  Problem Relation Age of Onset  . Hypertension Mother   . Cancer Mother    History   Social History  . Marital Status: Married    Spouse Name: N/A    Number of Children: N/A  .  Years of Education: N/A   Social History Main Topics  . Smoking status: Never Smoker   . Smokeless tobacco: Not on file  . Alcohol Use: No  . Drug Use: No  . Sexual Activity: Not on file   Other Topics Concern  . Not on file   Social History Narrative  . No narrative on file    Objective:  Physical Exam: Filed Vitals:   03/04/14 1449  BP: 123/83  Pulse: 98  Temp: 99 F (37.2 C)  TempSrc: Oral  Weight: 176 lb 3.2 oz (79.924 kg)  SpO2: 98%   General: no acute distress, appears as stated age HEENT: PERRL, EOMI,  no scleral icterus, +torus pallatini, full thyroid Cardiac: RRR, no rubs, murmurs or gallops Pulm: clear to auscultation bilaterally, moving normal volumes of air Abd: soft, nontender, nondistended, BS present Ext: warm and well perfused, no pedal edema Neuro: alert and oriented X3, cranial nerves II-XII grossly intact  Assessment & Plan:  Case and care discussed with Dr. Lynnae January.  Please see problem oriented charting for further details. Patient to return in 3 months for routine DM follow up.

## 2014-03-04 NOTE — Assessment & Plan Note (Signed)
BP Readings from Last 3 Encounters:  03/04/14 123/83  12/30/13 132/78  09/11/13 138/86    Lab Results  Component Value Date   NA 139 09/10/2013   K 3.8 09/10/2013   CREATININE 0.64 09/10/2013    Assessment: Blood pressure control: controlled Progress toward BP goal:  at goal  Plan: Medications:  continue current medications - lasix 40, lisinopril 10 Other plans: CMET today

## 2014-03-04 NOTE — Assessment & Plan Note (Signed)
Lab Results  Component Value Date   HGBA1C 7.3 03/04/2014   HGBA1C 9.1 03/05/2013   HGBA1C 8.1 10/02/2012     Assessment: Diabetes control: fair control Progress toward A1C goal:  improved  Plan: Medications:  Increase lantus to 42u qHS, cont metformin, lipids and cmet today Home glucose monitoring: Frequency: once a day Timing: before breakfast Instruction/counseling given: reminded to bring blood glucose meter & log to each visit, reminded to bring medications to each visit and discussed diet

## 2014-03-04 NOTE — Assessment & Plan Note (Signed)
Educated regarding diet and lifestyle changes. Start trial of omeprazole 20mg  daily x 3 months.

## 2014-03-04 NOTE — Assessment & Plan Note (Signed)
Referred for colonoscopy

## 2014-03-05 LAB — COMPREHENSIVE METABOLIC PANEL
ALK PHOS: 67 U/L (ref 39–117)
ALT: 22 U/L (ref 0–35)
AST: 23 U/L (ref 0–37)
Albumin: 4.3 g/dL (ref 3.5–5.2)
BILIRUBIN TOTAL: 0.5 mg/dL (ref 0.2–1.2)
BUN: 9 mg/dL (ref 6–23)
CO2: 29 mEq/L (ref 19–32)
CREATININE: 0.63 mg/dL (ref 0.50–1.10)
Calcium: 10.5 mg/dL (ref 8.4–10.5)
Chloride: 101 mEq/L (ref 96–112)
Glucose, Bld: 90 mg/dL (ref 70–99)
Potassium: 3.8 mEq/L (ref 3.5–5.3)
Sodium: 141 mEq/L (ref 135–145)
Total Protein: 7.7 g/dL (ref 6.0–8.3)

## 2014-03-05 LAB — LIPID PANEL
Cholesterol: 145 mg/dL (ref 0–200)
HDL: 55 mg/dL (ref >39)
LDL CALC: 67 mg/dL (ref 0–99)
Total CHOL/HDL Ratio: 2.6 Ratio
Triglycerides: 114 mg/dL (ref <150)
VLDL: 23 mg/dL (ref 0–40)

## 2014-03-09 ENCOUNTER — Other Ambulatory Visit: Payer: Self-pay | Admitting: Internal Medicine

## 2014-03-09 ENCOUNTER — Other Ambulatory Visit (HOSPITAL_COMMUNITY): Payer: Self-pay | Admitting: *Deleted

## 2014-03-09 DIAGNOSIS — Z1231 Encounter for screening mammogram for malignant neoplasm of breast: Secondary | ICD-10-CM

## 2014-03-09 NOTE — Progress Notes (Signed)
Case discussed with Dr. Sharda soon after the resident saw the patient.  We reviewed the resident's history and exam and pertinent patient test results.  I agree with the assessment, diagnosis, and plan of care documented in the resident's note. 

## 2014-03-12 ENCOUNTER — Ambulatory Visit (HOSPITAL_COMMUNITY)
Admission: RE | Admit: 2014-03-12 | Discharge: 2014-03-12 | Disposition: A | Discharge status: Home / Self Care | Payer: 59 | Source: Ambulatory Visit | Attending: Internal Medicine | Admitting: Internal Medicine

## 2014-03-12 DIAGNOSIS — Z1231 Encounter for screening mammogram for malignant neoplasm of breast: Secondary | ICD-10-CM | POA: Insufficient documentation

## 2014-03-23 ENCOUNTER — Ambulatory Visit (INDEPENDENT_AMBULATORY_CARE_PROVIDER_SITE_OTHER): Payer: 59 | Admitting: Internal Medicine

## 2014-03-23 ENCOUNTER — Encounter: Payer: Self-pay | Admitting: Internal Medicine

## 2014-03-23 VITALS — BP 144/82 | HR 56 | Temp 97.6°F | Ht 63.0 in | Wt 177.1 lb

## 2014-03-23 DIAGNOSIS — M25569 Pain in unspecified knee: Secondary | ICD-10-CM

## 2014-03-23 DIAGNOSIS — M25561 Pain in right knee: Secondary | ICD-10-CM

## 2014-03-23 NOTE — Progress Notes (Signed)
Subjective:   Patient ID: Michele Andrews female   DOB: Jun 22, 1951 63 y.o.   MRN: [id number]  HPI: [name] is a 63 y.o. woman with PMH significant for HTN, DM, HLD comes to the office with CC of right knee pain for the last 6 days.   Patient reports that she tripped over her dog fence (about 1 feet in height) 6 days ago while she was at home and fell on her right knee. She reports that she got up all by herself and didn't hit her head to the ground. She reports that the knee was swollen and painful ever since. She reports severe, constant, throbbing pain most above the knee and lateral aspect of her right knee. She denies any fever, chills, body pains. She reports using OTC Ibuprofen 800 mg PO TID along with topical ice packs without any relief. She reports limping while walking and difficulty bending and extending her knee. She reports occasional locking of the joint and denies any tingling or numbness.   She denies any other complaints.    Past Medical History  Diagnosis Date  . Hypertension   . Diabetes mellitus   . Hypercalcemia   . Hyperlipidemia   . Carpal tunnel syndrome   . Rotator cuff syndrome of right shoulder   . Osteopenia    Current Outpatient Prescriptions  Medication Sig Dispense Refill  . Blood Glucose Monitoring Suppl (TRUETRACK BLOOD GLUCOSE) W/DEVICE KIT Use to check blood sugar 2 times a day       . furosemide (LASIX) 40 MG tablet Take 1 tablet (40 mg total) by mouth daily.  60 tablet  3  . glucose blood (TRUETRACK TEST) test strip Use to check blood sugar 2 times a day  100 each  6  . ibuprofen (ADVIL,MOTRIN) 800 MG tablet Take 1 tablet (800 mg total) by mouth every 8 (eight) hours as needed for pain.  30 tablet  5  . Insulin Pen Needle 31G X 5 MM MISC 1 each by Does not apply route 3 (three) times daily before meals.  100 each  5  . Lancets (ACCU-CHEK SOFT TOUCH) lancets Use to check blood sugar 2 times a day  100 each  5  . LANTUS SOLOSTAR 100  UNIT/ML Solostar Pen Inject 42 Units into the skin at bedtime.  10 mL  11  . lisinopril (PRINIVIL,ZESTRIL) 10 MG tablet TAKE 1 TABLET BY MOUTH ONCE DAILY  30 tablet  11  . metFORMIN (GLUCOPHAGE) 1000 MG tablet TAKE 1 TABLET BY MOUTH 2 TIMES DAILY WITH A MEAL.  60 tablet  11  . omeprazole (PRILOSEC) 20 MG capsule Take 1 capsule (20 mg total) by mouth daily before breakfast.  90 capsule  0  . pravastatin (PRAVACHOL) 40 MG tablet TAKE 1 TABLET BY MOUTH ONCE DAILY  90 tablet  3   No current facility-administered medications for this visit.   Family History  Problem Relation Age of Onset  . Hypertension Mother   . Cancer Mother    History   Social History  . Marital Status: Married    Spouse Name: N/A    Number of Children: N/A  . Years of Education: N/A   Social History Main Topics  . Smoking status: Never Smoker   . Smokeless tobacco: None  . Alcohol Use: No  . Drug Use: No  . Sexual Activity: None   Other Topics Concern  . None   Social History Narrative  . None  Review of Systems: Pertinent items are noted in HPI. Objective:  Physical Exam: Filed Vitals:   03/23/14 1001  BP: 144/82  Pulse: 56  Temp: 97.6 F (36.4 C)  TempSrc: Oral  Height: 5' 3" (1.6 m)  Weight: 177 lb 1.6 oz (80.332 kg)  SpO2: 99%   Constitutional: Vital signs reviewed.  Patient is a well-developed and well-nourished and in mild pain. cooperative with exam. Alert and oriented x3.  Head: Normocephalic and atraumatic Cardiovascular: RRR, S1 normal, S2 normal, no MRG, pulses symmetric and intact bilaterally Pulmonary/Chest: normal respiratory effort, CTAB, no wheezes, rales, or rhonchi Musculoskeletal: Inspection: Visible right knee swelling noted more so in the suprapatellar area and the lateral aspect of the knee. There is no redness or bruises noted over the knee.  Palpation: There is no warmth noted over the right knee. Tight suprapatellar fullness noted consistent with fluid excess.  Tenderness to palpation over the superior aspect of the patella noted. No crepitus heard during patellar grind test. Active and passive flexion and extension are moderately limited secondary to pain.  Anterior, posterior drawer test negative. Valgus test: Elicited mild pain. Varus test: Elicited severe pain along the LCL line. McMurray test ; Negative Full extension and full flexion were not possible and are limited due to pain. Gait: Antalgic gait.   Assessment & Plan:

## 2014-03-23 NOTE — Patient Instructions (Signed)
Please follow up with Orthopedics today at 2:15 PM. If your symptoms get worse or do not improve, please give [place] a call or seek medical help.

## 2014-03-23 NOTE — Assessment & Plan Note (Signed)
In the setting of trauma. With effusion of the joint and possible synovitis. Clinical exam consistent with intraarticular pathology.   Plans: Orthopedic referral today at 2:15 PM for possible aspiration of the joint, radiological studies and further management. Further management as per ortho

## 2014-03-25 NOTE — Progress Notes (Signed)
Case discussed with Dr. Boggala at the time of the visit.  We reviewed the resident's history and exam and pertinent patient test results.  I agree with the assessment, diagnosis, and plan of care documented in the resident's note. 

## 2014-05-25 ENCOUNTER — Other Ambulatory Visit: Payer: Self-pay | Admitting: Internal Medicine

## 2014-05-25 ENCOUNTER — Ambulatory Visit (INDEPENDENT_AMBULATORY_CARE_PROVIDER_SITE_OTHER): Payer: Self-pay | Admitting: Family Medicine

## 2014-05-25 VITALS — BP 130/80 | Wt 172.0 lb

## 2014-05-25 DIAGNOSIS — E119 Type 2 diabetes mellitus without complications: Secondary | ICD-10-CM

## 2014-05-26 NOTE — Progress Notes (Signed)
Patient presents today for 3 month diabetes follow-up as part of the employer-sponsored Link to Wellness program. Current diabetes regimen includes Metformin, Lantus. Patient also continues on daily ASA, ACEi, and statin. Most recent MD follow-up was January 2015. Patient does not have a pending appt. Patient is currently seeing Dr. Victorio Palm, resident; however, she is graduating residency and pt will be transitioned to a new provider. Patient has started eating Mirano (unsure of spelling) seeds as a supplement. She was recommended this medicinal seed from a friend who lived in the [place]. Patient ordered the seeds online from the [place] and is eating them daily for diabetes, cancer prevention, and overall improved health. No major health changes at this time.  Diabetes Assessment: Type of Diabetes: Type 2; Sees Diabetes provider 2 times per year; checks feet daily; takes medications as prescribed; uses glucometer; MD managing Diabetes Dr. Victorio Palm, MD; takes an aspirin a day; checks blood glucose once daily; Lowest CBG 90; Highest CBG 127; A1c 7.9 Other Diabetes History: Current med regimen includes Metformin 1000 mg twice daily and Lantus 40 units daily. Patient does not maintain good medication compliance and reports skipping Lantus and evening metformin dose multipe times per week. I have suggested she get a pill box and that she mark on a calendar when she takes lantus so she will know for sure if she has taken it or not. Patient did not bring meter today and was testing once daily, but has not tested for the previous week because she ran out of strips. We have filled her strips today and pt will resume tesitng. Highest glucose has been 170. Hypoglycemia frequency is rare. Patient does demonstrate appropriate correction of hypoglycemia. Patient denies signs and symptoms of neuropathy including numbness/tingling/burning and symptoms of foot infection. Patient is not due for yearly eye exam and had  appt this past December. Dental exam in December as well.   Lifestyle Factors: Exercise - No routine exercise at this time. Patient has been very busy with her husband who was recently diagnosed with liver cancer. She is aware of the importance of exercise, and will try to get back on track. She does not want to walk outside due to hot temperatures. I have suggested pt march in place during commercial breaks while watching TV. Patient agreed to try this.  Diet - Patient's diet has not improved and may have even deteriorated. She is eating more breads and sweets lately. She is also enjoying lots of watermelon. She is attempting to monitor portions and is limiting fried foods. She is also drinking more water lately. Patient will attempt to limit sweets, especially snack cakes and cookies.  Assessment: Patient's DM control has deteriorated over the past three months. A1c has increased from 7.4 to 7.9. Unfortunately patient has made no changes to exercise and has allowed diet to deteriorate. However, she is motivated to make changes and has set goals to do so today. She will also work on resuming testing glucose and will increase medication compliance. Patient will return in 2 months for LTW follow-up.  Plan: 1) Limit sweets, especially snack cakes and cookies 2) Monitor porttion sizes 3) Attempt to increase exercise, consider marching in place or walking during commercial breaks during your favorite show 4) Purchase a pill box and a log book to record daily doses and glucose readings 5) Resume testing at least once daily 6) Follow-up in 2 months on Tuesday August 18th @ 1:30 pm A1c 7.9 (up from 7.4)

## 2014-07-28 ENCOUNTER — Ambulatory Visit (INDEPENDENT_AMBULATORY_CARE_PROVIDER_SITE_OTHER): Payer: Self-pay | Admitting: Family Medicine

## 2014-07-28 VITALS — BP 134/78 | HR 101 | Wt 172.0 lb

## 2014-07-28 DIAGNOSIS — E119 Type 2 diabetes mellitus without complications: Secondary | ICD-10-CM

## 2014-07-28 NOTE — Progress Notes (Signed)
Patient presents today for 3 month diabetes follow-up as part of the employer-sponsored Link to Wellness program. Current diabetes regimen includes Metformin, Lantus. Patient also continues on daily ASA, ACEi, and statin. Most recent MD follow-up was January 2015. Patient has a pending appt with new resident MD at [facility] on August 24th. Patient last saw Dr. Victorio Palm, resident; however, she is graduating residency and pt will be transitioned to a new provider. Patient continues eating Moringa seeds as a supplement. She was recommended this medicinal seed from a friend who lived in the [place]. Patient ordered the seeds online from the [facility] and is eating them daily for diabetes, cancer prevention, and overall improved health.   Diabetes Assessment: Type of Diabetes: Type 2; Sees Diabetes provider 2 times per year; checks feet daily; takes medications as prescribed; uses glucometer; takes an aspirin a day; checks blood glucose once daily; Lowest CBG 81; MD managing Diabetes Dr. Hulen Luster, MD Other Diabetes History: Current med regimen includes Metformin 1000 mg twice daily and Lantus 42 units daily (increased by two units). Patient does maintain good medication compliance and reports improvements in this area. Patient did not bring meter today but is testing once daily. Hypoglycemia frequency is rare. Patient does demonstrate appropriate correction of hypoglycemia. Patient denies signs and symptoms of neuropathy including numbness/tingling/burning and symptoms of foot infection. Patient is not due for yearly eye exam and had appt this past December. Dental exam in December as well.   Lifestyle Factors: Exercise - Has started exercising in [facility] fitness facility three times weekly for about 30 minutes. Has been doing this for 2-3 weeks. Prior to this she was doing no routine exercise. She is aware of the importance of exercise, and seems committed to exercising regularly.  Diet - Patient's  diet has improved slightly but she continues to have room for improvement. She is attempting to limit breads and sweets, but is struggling in this area. She continues enjoying summer fruits and watermelon. She is attempting to monitor portions and is limiting fried foods. She continues drinking more water lately.   Assessment: Patient presents today for final Link to Wellness visit. She is no longer eligible to continue in the program due to recent insurance change. She is doing well, has made small changes to lifestyle including increased exercise. She will follow-up with MD soon with hopes of improved A1c. It has been a pleasure working with this patient and I have ensured her that I am available to answer any future diabetes questions or needs.     Plan: 1) Continue to make healthy dietary choices, focus on limiting breads and sweets 2) Great job with improved exercise, continue exercising at least three times per week for 30 minutes 3) Continue to maintain medication compliance 4) Keep appt with MD on 08/03/14

## 2014-08-03 ENCOUNTER — Encounter: Payer: Self-pay | Admitting: Internal Medicine

## 2014-08-03 ENCOUNTER — Ambulatory Visit (INDEPENDENT_AMBULATORY_CARE_PROVIDER_SITE_OTHER): Payer: No Typology Code available for payment source | Admitting: Internal Medicine

## 2014-08-03 VITALS — BP 121/72 | HR 104 | Temp 98.1°F | Ht 63.0 in | Wt 172.0 lb

## 2014-08-03 DIAGNOSIS — E119 Type 2 diabetes mellitus without complications: Secondary | ICD-10-CM

## 2014-08-03 DIAGNOSIS — M899 Disorder of bone, unspecified: Secondary | ICD-10-CM

## 2014-08-03 DIAGNOSIS — E210 Primary hyperparathyroidism: Secondary | ICD-10-CM

## 2014-08-03 DIAGNOSIS — M8580 Other specified disorders of bone density and structure, unspecified site: Secondary | ICD-10-CM | POA: Insufficient documentation

## 2014-08-03 DIAGNOSIS — M949 Disorder of cartilage, unspecified: Secondary | ICD-10-CM

## 2014-08-03 DIAGNOSIS — Z299 Encounter for prophylactic measures, unspecified: Secondary | ICD-10-CM

## 2014-08-03 DIAGNOSIS — K219 Gastro-esophageal reflux disease without esophagitis: Secondary | ICD-10-CM

## 2014-08-03 LAB — POCT GLYCOSYLATED HEMOGLOBIN (HGB A1C): Hemoglobin A1C: 7.7

## 2014-08-03 LAB — BASIC METABOLIC PANEL WITH GFR
BUN: 14 mg/dL (ref 6–23)
CALCIUM: 11.9 mg/dL — AB (ref 8.4–10.5)
CO2: 27 mEq/L (ref 19–32)
CREATININE: 0.72 mg/dL (ref 0.50–1.10)
Chloride: 99 mEq/L (ref 96–112)
GFR, Est Non African American: 89 mL/min
GLUCOSE: 126 mg/dL — AB (ref 70–99)
Potassium: 4.5 mEq/L (ref 3.5–5.3)
SODIUM: 141 meq/L (ref 135–145)

## 2014-08-03 LAB — GLUCOSE, CAPILLARY: Glucose-Capillary: 158 mg/dL — ABNORMAL HIGH (ref 70–99)

## 2014-08-03 MED ORDER — OMEPRAZOLE 20 MG PO CPDR
20.0000 mg | DELAYED_RELEASE_CAPSULE | Freq: Every day | ORAL | Status: DC
Start: 2014-08-03 — End: 2014-08-20

## 2014-08-03 NOTE — Assessment & Plan Note (Signed)
Will place referral for colonoscopy and will consider zostavax at next visit.

## 2014-08-03 NOTE — Assessment & Plan Note (Signed)
Last DEXA scan was in 2013. Pt is due for DEXA scan for osteopenia this year. Placed order for DEXA.

## 2014-08-03 NOTE — Assessment & Plan Note (Signed)
Pt w/ hx of primary hyperparathyroidism. Will f/u with repeat PTH and BMP today. If PTH is normal will look into other causes of elevated Ca++ such as familial hypercalcemia hypocalciuria

## 2014-08-03 NOTE — Progress Notes (Signed)
Subjective:     Patient ID: Michele Andrews, female   DOB: 1951/07/01, 63 y.o.   MRN: [id number]  Diabetes Pertinent negatives for hypoglycemia include no headaches. Pertinent negatives for diabetes include no chest pain.   Pt is a 63 y/o female w/ PMH of DM, primary hyperparathyroidism, HTN, and GERD who presents for diabetes follow up and mediation refills. Her A1c today was 7.7 up from 7.3 on 03/04/14. She is currently on 42 units of lantus qhs and metformin 1000mg  BID. She admits to noncompliance with her night time dose of lantus. She checks her blood sugars once a day and after looking through her glucometer (could not download report) her glucose ranges from 81-268. Pt denies HAs, N/V, abdominal pain, polyuria, polydipsia, and lightheadedness. She has recently started exercising for 30 minutes 3/week at the hospital gym. She states she knows what she needs to do to improve her glucose readings which is to cut down on bread and butter as well as eat dinner earlier.   Pt also requesting refills, after looking over med list she only needs refill for prilosec. Of note she has been taking OTC moringa seeds which she states is suppose to help with blood pressure as well as give an energy boost.    Review of Systems  Constitutional: Negative for fever, activity change and unexpected weight change.  Cardiovascular: Negative for chest pain.  Gastrointestinal: Negative for abdominal pain, diarrhea and constipation.  Neurological: Negative for light-headedness and headaches.       Objective:   Physical Exam  Constitutional: She appears well-developed and well-nourished.  HENT:  Mouth/Throat: Oropharynx is clear and moist. No oropharyngeal exudate.  Eyes: Pupils are equal, round, and reactive to light.  Cardiovascular: Regular rhythm.   Tachycardic   Pulmonary/Chest: Effort normal and breath sounds normal.  Abdominal: Soft. Bowel sounds are normal. There is no tenderness.       Assessment:      Please see problem based assessment and plan.      Plan:     Please see problem based assessment and plan.

## 2014-08-03 NOTE — Assessment & Plan Note (Signed)
Pt currently takes Prilosec for GERD. She is asymptomatic today, will refill prilosec.

## 2014-08-03 NOTE — Assessment & Plan Note (Addendum)
Lab Results  Component Value Date   HGBA1C 7.7 08/03/2014   HGBA1C 7.3 03/04/2014   HGBA1C 9.1 03/05/2013     Assessment: Diabetes control: fair control Progress toward A1C goal:  deteriorated Comments: currently taking 42 units of lantus qhs and metformin 1000mg  BID. Occasionally misses night time lantus dose.   Plan: Medications:  continue current medications Home glucose monitoring: Frequency: once a day Timing: before breakfast Instruction/counseling given: reminded to bring blood glucose meter & log to each visit Other plans: enforced importance of insulin compliance, educated to cut back on bread and work on diet. Will f/u in 3 months. Pt does not want to meet with diabetes educator at this time. Pt has a recent eye exam this year, will need to get records from optometrist.

## 2014-08-03 NOTE — Patient Instructions (Signed)
General Instructions:   Please make sure to take your insulin as directed. We will see you in 3 months to check your blood sugars. Please bring your glucometer again to this visit so that we can adjust your insulin as needed.   Please try to bring all your medicines next time. This will help [place] keep you safe from mistakes.   Progress Toward Treatment Goals:  Treatment Goal 08/03/2014  Hemoglobin A1C deteriorated  Blood pressure at goal    Self Care Goals & Plans:  Self Care Goal 03/04/2014  Manage my medications take my medicines as prescribed  Monitor my health -  Eat healthy foods eat more vegetables  Be physically active find an activity I enjoy; find a convenient safe place to exercise  Meeting treatment goals maintain the current self-care plan    Home Blood Glucose Monitoring 08/03/2014  Check my blood sugar once a day  When to check my blood sugar before breakfast

## 2014-08-04 NOTE — Progress Notes (Signed)
Internal Medicine Clinic Attending Date of visit: 08/03/2014  I saw and evaluated the patient.  I personally confirmed the key portions of the history and exam documented by Dr. Hulen Luster and I reviewed pertinent patient test results.  The assessment, diagnosis, and plan were formulated together and I agree with the documentation in the resident's note.

## 2014-08-05 LAB — PTH, INTACT AND CALCIUM
Calcium: 11.4 mg/dL — ABNORMAL HIGH (ref 8.4–10.5)
PTH: 77 pg/mL — ABNORMAL HIGH (ref 14–64)

## 2014-08-06 ENCOUNTER — Encounter: Payer: Self-pay | Admitting: Family Medicine

## 2014-08-06 NOTE — Progress Notes (Signed)
Patient ID: ZYKERRIA TANTON, female   DOB: 11-02-1951, 63 y.o.   MRN: [id number] Reviewed: Agree with the documentation and management by my [place].

## 2014-08-06 NOTE — Progress Notes (Signed)
Patient ID: Michele Andrews, female   DOB: 01-01-1951, 63 y.o.   MRN: [id number] Reviewed: Agree with the documentation and management by my [place].

## 2014-08-06 NOTE — Progress Notes (Signed)
Patient ID: Michele Andrews, female   DOB: [birth year], 63 y.o.   MRN: [id number] Reviewed: Agree with the documentation and management by my [place].

## 2014-08-20 ENCOUNTER — Other Ambulatory Visit: Payer: Self-pay | Admitting: *Deleted

## 2014-08-20 DIAGNOSIS — E785 Hyperlipidemia, unspecified: Secondary | ICD-10-CM

## 2014-08-20 DIAGNOSIS — E119 Type 2 diabetes mellitus without complications: Secondary | ICD-10-CM

## 2014-08-20 DIAGNOSIS — K219 Gastro-esophageal reflux disease without esophagitis: Secondary | ICD-10-CM

## 2014-08-20 NOTE — Telephone Encounter (Signed)
Pt is changing pharmacy's and needs new Rx's for 90 day supply

## 2014-08-23 MED ORDER — METFORMIN HCL 1000 MG PO TABS: 1000.0000 mg | ORAL_TABLET | Freq: Two times a day (BID) | ORAL | Status: DC

## 2014-08-23 MED ORDER — PRAVASTATIN SODIUM 40 MG PO TABS: ORAL_TABLET | ORAL | Status: DC

## 2014-08-23 MED ORDER — OMEPRAZOLE 20 MG PO CPDR: 20.0000 mg | DELAYED_RELEASE_CAPSULE | Freq: Every day | ORAL | Status: DC

## 2014-08-23 MED ORDER — LISINOPRIL 10 MG PO TABS: 10.0000 mg | ORAL_TABLET | Freq: Every day | ORAL | Status: DC

## 2014-08-28 ENCOUNTER — Other Ambulatory Visit: Payer: Self-pay | Admitting: *Deleted

## 2014-09-01 MED ORDER — GLUCOSE BLOOD VI STRP: ORAL_STRIP | Status: DC

## 2014-10-07 ENCOUNTER — Other Ambulatory Visit: Payer: Self-pay | Admitting: *Deleted

## 2014-10-07 MED ORDER — FUROSEMIDE 40 MG PO TABS: 40.0000 mg | ORAL_TABLET | Freq: Every day | ORAL | Status: DC

## 2014-10-08 ENCOUNTER — Encounter: Payer: Self-pay | Admitting: Family Medicine

## 2014-10-08 NOTE — Progress Notes (Signed)
Patient ID: Michele Andrews, female   DOB: 12/17/1950, 63 y.o.   MRN: [id number] Reviewed: Agree with the documentation and management of our [place].

## 2014-10-14 ENCOUNTER — Telehealth: Payer: Self-pay | Admitting: *Deleted

## 2014-10-14 NOTE — Telephone Encounter (Addendum)
Pt currently using Lantus pen, which is now  "non-preferred" with her insurance plan.  Pt must first try and fail Levemir pen.  Pt is aware of the change, will send request to pcp to have lantus pen changed to the levemir pen if appropriate.  Please advise.Michele Hidden Cassady11/4/20153:26 PM

## 2014-10-23 NOTE — Telephone Encounter (Signed)
PLEASE CHANGE THIS ASAP, PT IS OUT AND NEEDS THE LEVEMIR SENT TO PHARMACY!

## 2014-10-25 ENCOUNTER — Other Ambulatory Visit: Payer: Self-pay | Admitting: Internal Medicine

## 2014-10-25 MED ORDER — INSULIN DETEMIR 100 UNIT/ML FLEXPEN: 42.0000 [IU] | PEN_INJECTOR | Freq: Every day | SUBCUTANEOUS | Status: DC

## 2014-11-11 ENCOUNTER — Telehealth: Payer: Self-pay | Admitting: *Deleted

## 2014-11-11 ENCOUNTER — Other Ambulatory Visit: Payer: Self-pay | Admitting: *Deleted

## 2014-11-11 NOTE — Telephone Encounter (Signed)
Pt scheduled for OV in clinic with resident and D [place] on Friday. Pt aware.

## 2014-11-11 NOTE — Telephone Encounter (Signed)
Pt called stating her CBG's have been elevated since changing from Lantus to Levemir. Pt now takes Levemir 42 units in PM Morning CBG's 190, 150, 130, and 140.  I called patient and she has been on Levemir about 2 weeks. She checks sugars once a day before eating. She wants to know if she should increase levemir dose.  Pt # [id number]

## 2014-11-13 ENCOUNTER — Encounter: Payer: Self-pay | Admitting: Dietician

## 2014-11-13 ENCOUNTER — Ambulatory Visit (INDEPENDENT_AMBULATORY_CARE_PROVIDER_SITE_OTHER): Payer: No Typology Code available for payment source | Admitting: Dietician

## 2014-11-13 ENCOUNTER — Ambulatory Visit (INDEPENDENT_AMBULATORY_CARE_PROVIDER_SITE_OTHER): Payer: No Typology Code available for payment source | Admitting: Internal Medicine

## 2014-11-13 ENCOUNTER — Other Ambulatory Visit: Payer: Self-pay | Admitting: Dietician

## 2014-11-13 ENCOUNTER — Encounter: Payer: Self-pay | Admitting: Internal Medicine

## 2014-11-13 VITALS — Wt 174.5 lb

## 2014-11-13 VITALS — BP 126/78 | HR 98 | Temp 98.5°F | Ht 63.0 in | Wt 174.5 lb

## 2014-11-13 DIAGNOSIS — E119 Type 2 diabetes mellitus without complications: Secondary | ICD-10-CM

## 2014-11-13 DIAGNOSIS — E210 Primary hyperparathyroidism: Secondary | ICD-10-CM

## 2014-11-13 LAB — GLUCOSE, CAPILLARY: Glucose-Capillary: 128 mg/dL — ABNORMAL HIGH (ref 70–99)

## 2014-11-13 LAB — HM DIABETES EYE EXAM

## 2014-11-13 LAB — POCT GLYCOSYLATED HEMOGLOBIN (HGB A1C): Hemoglobin A1C: 7.8

## 2014-11-13 MED ORDER — INSULIN DETEMIR 100 UNIT/ML FLEXPEN: 44.0000 [IU] | PEN_INJECTOR | Freq: Every day | SUBCUTANEOUS | Status: DC

## 2014-11-13 NOTE — Assessment & Plan Note (Signed)
Patient w/ h/o primary hyperparathyroidism, most recent PTH 77 and Calcium 11.9 as of 07/2014. Patient continues to be asymptomatic.  -Given persistent elevation of calcium, would consider referral for possible parathyroidectomy in the future given calcium level > 1.0 mg/dL above ULN. -Patient given referral for DEXA, will pursue in the next 1-2 weeks (last DEXA 01/2012).

## 2014-11-13 NOTE — Progress Notes (Signed)
Internal Medicine Clinic Attending  Case discussed with Dr. Jones at the time of the visit.  We reviewed the resident's history and exam and pertinent patient test results.  I agree with the assessment, diagnosis, and plan of care documented in the resident's note.  

## 2014-11-13 NOTE — Patient Instructions (Signed)
General Instructions:  1. Please schedule a follow up appointment for 2 weeks.  Schedule Bone Density Exam  2. Please take all medications as prescribed.   Increase Levemir to 44 units daily.   3. If you have worsening of your symptoms or new symptoms arise, please call the clinic ([phone]), or go to the ER immediately if symptoms are severe.  You have done a great job in taking all your medications. I appreciate it very much. Please continue doing that.   Please bring your medicines with you each time you come to clinic.  Medicines may include prescription medications, over-the-counter medications, herbal remedies, eye drops, vitamins, or other pills.   Progress Toward Treatment Goals:  Treatment Goal 11/13/2014  Hemoglobin A1C unchanged  Blood pressure at goal    Self Care Goals & Plans:  Self Care Goal 11/13/2014  Manage my medications take my medicines as prescribed; bring my medications to every visit; refill my medications on time  Monitor my health keep track of my blood glucose  Eat healthy foods drink diet soda or water instead of juice or soda; eat more vegetables  Be physically active take a walk every day  Meeting treatment goals maintain the current self-care plan    Home Blood Glucose Monitoring 11/13/2014  Check my blood sugar once a day  When to check my blood sugar before breakfast     Care Management & Community Referrals:  Referral 11/13/2014  Referrals made for care management support diabetes educator  Referrals made to community resources none

## 2014-11-13 NOTE — Progress Notes (Signed)
=   Medical Nutrition Therapy:  Appt start time: 1300 end time:  0923.   Assessment:  Primary concerns today: blood sugar control.  Patient here because her blood sugars have risen since changing to a different type of basal insulin. She denies large portions over the recent holiday or any change in food or activity. Her weight is stable, her a1C has risen 0.1 since last A1C 3 months ago.  Preferred Learning Style: No preference indicated  Behavior Change Readiness: Contemplating MEDICATIONS: noted and patient reports adherence with 42 units of levemir qam and metformin 1000 mg twice daily  BLOOD SUGARS: using a freestyle lite meter, 7 day ave 172, 14 day ave 163, 30 day ave 154, fasting CBgs before change to Levemir were mostly lower 100s, now more in the higher 100s about a 20-30 mg/dl increase. DIETARY INTAKE: Usual eating pattern includes 3 meals and 1 snacks per day. Everyday foods include fruit.     24-hr recall not done today, patient reports her main problem is to much bread and butter(says her goal is to reduce her 2 slices to 1 slice with meals) , eats small meals and often, tries not to go back for seconds Snk (PM):sandwich  Usual physical activity: ADLs and works as Chartered certified accountant here part time, walks when she can but no formal activity,  she is thinking about walking on the treadmill here for 30 minutes on days she works  Progress Towards Goal(s):  In progress.   Nutritional Diagnosis:  Amanda-2.2 Altered nutrition-related laboratory As related to change in diabetes medicine fromone type of insulin to another.  As evidenced by her increased blood sugars in absence of diet or activity change.    Intervention:  Nutrition education about diabetes medicines. Teaching Method Utilized: Visual, Auditory Handouts given during visit include: labs per her request Barriers to learning/adherence to lifestyle change: none noted  Demonstrated degree of understanding via:  Teach Back    Monitoring/Evaluation:  Dietary intake, exercise, meter, and body weight prn.

## 2014-11-13 NOTE — Progress Notes (Signed)
Subjective:   Patient ID: Michele Andrews female   DOB: [birth year] 63 y.o.   MRN: [id number]  HPI: [name] is a 63 y.o. female w/ PMHx of DM type II, HTN, HLD, and hypercalcemia 2/2 primary hyperparathyroidism, presents to the clinic today for a follow-up visit. The patient states that about 2 weeks ago, her insurance stopped covering her Lantus (took 42 units qhs) and she was forced to change to Levemir insulin (transitioned to 42 units qhs). Since that time, her AM CBG readings have increased from an average of 110-150 to 150-190. Given that her blood sugar has been relatively well controlled, this has become worrisome to her. She denies any low blood sugars recently. No symptoms of tremulousness, diaphoresis, dizziness, lightheadedness, nausea, or blurry vision. She also denies any recent blood sugars higher than 195.   Past Medical History  Diagnosis Date  . Hypertension   . Diabetes mellitus   . Hypercalcemia   . Hyperlipidemia   . Carpal tunnel syndrome   . Rotator cuff syndrome of right shoulder   . Osteopenia    Current Outpatient Prescriptions  Medication Sig Dispense Refill  . Blood Glucose Monitoring Suppl (TRUETRACK BLOOD GLUCOSE) W/DEVICE KIT Use to check blood sugar 2 times a day     . furosemide (LASIX) 40 MG tablet Take 1 tablet (40 mg total) by mouth daily. 60 tablet 3  . glucose blood (FREESTYLE TEST STRIPS) test strip Use to check blood sugar 3 to 4 times daily. diag code 250.00. Insulin dependent 100 each 6  . ibuprofen (ADVIL,MOTRIN) 800 MG tablet Take 1 tablet (800 mg total) by mouth every 8 (eight) hours as needed for pain. 30 tablet 5  . Insulin Detemir (LEVEMIR) 100 UNIT/ML Pen Inject 42 Units into the skin at bedtime. 15 mL 11  . Insulin Pen Needle 31G X 5 MM MISC 1 each by Does not apply route 3 (three) times daily before meals. 100 each 5  . Lancets (ACCU-CHEK SOFT TOUCH) lancets Use to check blood sugar 2 times a day 100 each 5  . LANTUS SOLOSTAR  100 UNIT/ML Solostar Pen Inject 42 Units into the skin at bedtime. (Patient not taking: Reported on 11/13/2014) 10 mL 11  . lisinopril (PRINIVIL,ZESTRIL) 10 MG tablet Take 1 tablet (10 mg total) by mouth daily. 90 tablet 4  . metFORMIN (GLUCOPHAGE) 1000 MG tablet Take 1 tablet (1,000 mg total) by mouth 2 (two) times daily with a meal. 180 tablet 4  . omeprazole (PRILOSEC) 20 MG capsule Take 1 capsule (20 mg total) by mouth daily before breakfast. 90 capsule 0  . pravastatin (PRAVACHOL) 40 MG tablet TAKE 1 TABLET BY MOUTH ONCE DAILY 90 tablet 3   No current facility-administered medications for this visit.    Review of Systems: General: Denies fever, chills, diaphoresis, appetite change and fatigue.  Respiratory: Denies SOB, DOE, cough, and wheezing.   Cardiovascular: Denies chest pain and palpitations.  Gastrointestinal: Denies nausea, vomiting, abdominal pain, and diarrhea.  Genitourinary: Denies dysuria, increased frequency, and flank pain. Endocrine: Denies hot or cold intolerance, polyuria, and polydipsia. Musculoskeletal: Denies myalgias, back pain, joint swelling, arthralgias and gait problem.  Skin: Denies pallor, rash and wounds.  Neurological: Denies dizziness, seizures, syncope, weakness, lightheadedness, numbness and headaches.  Psychiatric/Behavioral: Denies mood changes, and sleep disturbances.  Objective:   Physical Exam: Filed Vitals:   11/13/14 1338  BP: 126/78  Pulse: 98  Temp: 98.5 F (36.9 C)  TempSrc: Oral  Height: 5'  3" (1.6 m)  Weight: 174 lb 8 oz (79.153 kg)  SpO2: 100%    General: AA female, alert, cooperative, NAD. HEENT: PERRL, EOMI. Moist mucus membranes. Wears glasses.  Neck: Full range of motion without pain, supple, no lymphadenopathy or carotid bruits Lungs: Clear to ascultation bilaterally, normal work of respiration, no wheezes, rales, rhonchi Heart: RRR, no murmurs, gallops, or rubs Abdomen: Soft, non-tender, non-distended, BS + Extremities:  No cyanosis, clubbing, or edema. Feet without ulcers or decreased sensation.  Neurologic: Alert & oriented X3, cranial nerves II-XII intact, strength grossly intact, sensation intact to light touch   Assessment & Plan:   Please see problem based assessment and plan.

## 2014-11-13 NOTE — Assessment & Plan Note (Addendum)
Patient previously on Lantus 42 units qhs, changed to Levemir 42 units qhs 2 weeks ago. Since that time, her average blood sugar has increased from ~150 to ~180. She denies any recent significant episodes of hyper- or hypoglycemia or symptoms of such. The patient checks her blood sugars 1 time per day in the AM. After some research, it appears that Levemir insulin has a peak (unlike Lantus insulin) some time between 3-9 hours after injection. Per up-to-date review, some patients benefit if doses are given bid.  -Will increase Levemir to 44 units qhs for now. -Encouraged patient to be diligent about checking her blood sugars.  -If persistently elevated on return visit (1-2 weeks), suggest administering bid dosing w/ more frequent CBG checks.  -Continue Metformin 1000 mg bid -Met w/ Michele Andrews today as well. Appreciate recommendations and patient counseling.  -Encouraged patient to follow up w/ ophthalmologist.

## 2014-11-16 ENCOUNTER — Encounter: Payer: Self-pay | Admitting: *Deleted

## 2014-11-27 ENCOUNTER — Ambulatory Visit (HOSPITAL_COMMUNITY): Payer: No Typology Code available for payment source

## 2014-11-30 ENCOUNTER — Ambulatory Visit (HOSPITAL_COMMUNITY)
Admission: RE | Admit: 2014-11-30 | Discharge: 2014-11-30 | Disposition: A | Discharge status: Home / Self Care | Payer: No Typology Code available for payment source | Source: Ambulatory Visit | Attending: Internal Medicine | Admitting: Internal Medicine

## 2014-11-30 DIAGNOSIS — Z13820 Encounter for screening for osteoporosis: Secondary | ICD-10-CM | POA: Insufficient documentation

## 2014-11-30 DIAGNOSIS — M8580 Other specified disorders of bone density and structure, unspecified site: Secondary | ICD-10-CM | POA: Insufficient documentation

## 2014-11-30 DIAGNOSIS — Z780 Asymptomatic menopausal state: Secondary | ICD-10-CM | POA: Diagnosis not present

## 2015-03-02 ENCOUNTER — Encounter (HOSPITAL_COMMUNITY): Payer: Self-pay | Admitting: *Deleted

## 2015-03-02 ENCOUNTER — Emergency Department (INDEPENDENT_AMBULATORY_CARE_PROVIDER_SITE_OTHER)
Admission: EM | Admit: 2015-03-02 | Discharge: 2015-03-02 | Disposition: A | Discharge status: Home / Self Care | Payer: No Typology Code available for payment source | Source: Home / Self Care | Attending: Family Medicine | Admitting: Family Medicine

## 2015-03-02 DIAGNOSIS — S39012A Strain of muscle, fascia and tendon of lower back, initial encounter: Secondary | ICD-10-CM

## 2015-03-02 MED ORDER — METAXALONE 800 MG PO TABS: 800.0000 mg | ORAL_TABLET | Freq: Three times a day (TID) | ORAL | Status: DC

## 2015-03-02 MED ORDER — IBUPROFEN 800 MG PO TABS: 800.0000 mg | ORAL_TABLET | Freq: Three times a day (TID) | ORAL | Status: DC

## 2015-03-02 NOTE — ED Notes (Signed)
Pt   Reports      Low  Back    Pain        X  2  Days        denys  Any  specefic  Injury        -   Reports  Pain on  Certain  Movements  And  Or  Posistions     Pt      Ambulated  To  Room with slow  Steady  Gait

## 2015-03-02 NOTE — ED Provider Notes (Signed)
CSN: [id number]     Arrival date & time 03/02/15  0930 History   First MD Initiated Contact with Patient 03/02/15 1009     Chief Complaint  Patient presents with  . Back Pain   (Consider location/radiation/quality/duration/timing/severity/associated sxs/prior Treatment) Patient is a 64 y.o. female presenting with back pain. The history is provided by the patient.  Back Pain Location:  Lumbar spine Quality:  Stiffness Radiates to:  Does not radiate Pain severity:  Mild Onset quality:  Gradual Duration:  2 days Progression:  Unchanged Chronicity:  New Context: lifting heavy objects   Context comment:  Doing a lot of housework recently Relieved by:  None tried Associated symptoms: no abdominal pain, no abdominal swelling, no bladder incontinence, no bowel incontinence, no dysuria, no fever, no leg pain, no numbness, no tingling and no weakness   Risk factors: lack of exercise     Past Medical History  Diagnosis Date  . Hypertension   . Diabetes mellitus   . Hypercalcemia   . Hyperlipidemia   . Carpal tunnel syndrome   . Rotator cuff syndrome of right shoulder   . Osteopenia    History reviewed. No pertinent past surgical history. Family History  Problem Relation Age of Onset  . Hypertension Mother   . Cancer Mother    History  Substance Use Topics  . Smoking status: Never Smoker   . Smokeless tobacco: Not on file  . Alcohol Use: No   OB History    No data available     Review of Systems  Constitutional: Negative.  Negative for fever.  Gastrointestinal: Negative.  Negative for abdominal pain and bowel incontinence.  Genitourinary: Negative for bladder incontinence and dysuria.  Musculoskeletal: Positive for back pain. Negative for gait problem and neck pain.  Skin: Negative.   Neurological: Negative for tingling, weakness and numbness.    Allergies  Review of patient's allergies indicates no known allergies.  Home Medications   Prior to Admission  medications   Medication Sig Start Date End Date Taking? Authorizing Provider  Blood Glucose Monitoring Suppl (TRUETRACK BLOOD GLUCOSE) W/DEVICE KIT Use to check blood sugar 2 times a day     Historical Provider, MD  furosemide (LASIX) 40 MG tablet Take 1 tablet (40 mg total) by mouth daily. 10/07/14   Norman Herrlich, MD  glucose blood (FREESTYLE TEST STRIPS) test strip Use to check blood sugar 3 to 4 times daily. diag code 250.00. Insulin dependent 09/01/14   Norman Herrlich, MD  ibuprofen (ADVIL,MOTRIN) 800 MG tablet Take 1 tablet (800 mg total) by mouth 3 (three) times daily. 03/02/15   Billy Fischer, MD  Insulin Detemir (LEVEMIR) 100 UNIT/ML Pen Inject 44 Units into the skin at bedtime. 11/13/14   Corky Sox, MD  Insulin Pen Needle 31G X 5 MM MISC 1 each by Does not apply route 3 (three) times daily before meals. 11/26/13   Othella Boyer, MD  Lancets (ACCU-CHEK SOFT TOUCH) lancets Use to check blood sugar 2 times a day 11/26/13   Othella Boyer, MD  lisinopril (PRINIVIL,ZESTRIL) 10 MG tablet Take 1 tablet (10 mg total) by mouth daily. 08/23/14   Norman Herrlich, MD  metaxalone (SKELAXIN) 800 MG tablet Take 1 tablet (800 mg total) by mouth 3 (three) times daily. As muscle relaxer 03/02/15   Billy Fischer, MD  metFORMIN (GLUCOPHAGE) 1000 MG tablet Take 1 tablet (1,000 mg total) by mouth 2 (two) times daily with a meal. 08/23/14  Norman Herrlich, MD  omeprazole (PRILOSEC) 20 MG capsule Take 1 capsule (20 mg total) by mouth daily before breakfast. 08/23/14   Norman Herrlich, MD  pravastatin (PRAVACHOL) 40 MG tablet TAKE 1 TABLET BY MOUTH ONCE DAILY 08/23/14   Norman Herrlich, MD   BP 142/86 mmHg  Pulse 72  Temp(Src) 98.6 F (37 C)  Resp 16  SpO2 100% Physical Exam  Constitutional: She is oriented to person, place, and time. She appears well-developed and well-nourished.  Abdominal: Soft. Bowel sounds are normal. There is no tenderness.  Musculoskeletal: She exhibits tenderness.  Neurological: She is  alert and oriented to person, place, and time.  Skin: Skin is warm and dry.  Nursing note and vitals reviewed.   ED Course  Procedures (including critical care time) Labs Review Labs Reviewed - No data to display  Imaging Review No results found.   MDM   1. Low back strain, initial encounter        Billy Fischer, MD 03/02/15 1035

## 2015-03-09 ENCOUNTER — Encounter: Payer: Self-pay | Admitting: Internal Medicine

## 2015-03-09 ENCOUNTER — Ambulatory Visit (INDEPENDENT_AMBULATORY_CARE_PROVIDER_SITE_OTHER): Payer: No Typology Code available for payment source | Admitting: Internal Medicine

## 2015-03-09 VITALS — BP 143/77 | HR 113 | Temp 98.7°F | Wt 174.9 lb

## 2015-03-09 DIAGNOSIS — E1165 Type 2 diabetes mellitus with hyperglycemia: Secondary | ICD-10-CM

## 2015-03-09 DIAGNOSIS — I10 Essential (primary) hypertension: Secondary | ICD-10-CM | POA: Diagnosis not present

## 2015-03-09 DIAGNOSIS — E785 Hyperlipidemia, unspecified: Secondary | ICD-10-CM | POA: Diagnosis not present

## 2015-03-09 DIAGNOSIS — E210 Primary hyperparathyroidism: Secondary | ICD-10-CM | POA: Diagnosis not present

## 2015-03-09 DIAGNOSIS — IMO0001 Reserved for inherently not codable concepts without codable children: Secondary | ICD-10-CM

## 2015-03-09 DIAGNOSIS — Z0000 Encounter for general adult medical examination without abnormal findings: Secondary | ICD-10-CM

## 2015-03-09 LAB — COMPLETE METABOLIC PANEL WITH GFR
ALBUMIN: 4.1 g/dL (ref 3.5–5.2)
ALK PHOS: 74 U/L (ref 39–117)
ALT: 19 U/L (ref 0–35)
AST: 17 U/L (ref 0–37)
BUN: 13 mg/dL (ref 6–23)
CALCIUM: 10.7 mg/dL — AB (ref 8.4–10.5)
CHLORIDE: 98 meq/L (ref 96–112)
CO2: 27 meq/L (ref 19–32)
CREATININE: 0.65 mg/dL (ref 0.50–1.10)
Glucose, Bld: 223 mg/dL — ABNORMAL HIGH (ref 70–99)
POTASSIUM: 3.7 meq/L (ref 3.5–5.3)
SODIUM: 134 meq/L — AB (ref 135–145)
Total Bilirubin: 0.4 mg/dL (ref 0.2–1.2)
Total Protein: 7.4 g/dL (ref 6.0–8.3)

## 2015-03-09 LAB — CBC WITH DIFFERENTIAL/PLATELET
BASOS ABS: 0 10*3/uL (ref 0.0–0.1)
Basophils Relative: 0 % (ref 0–1)
Eosinophils Absolute: 0.1 10*3/uL (ref 0.0–0.7)
Eosinophils Relative: 1 % (ref 0–5)
HCT: 39.7 % (ref 36.0–46.0)
Hemoglobin: 12.8 g/dL (ref 12.0–15.0)
Lymphocytes Relative: 41 % (ref 12–46)
Lymphs Abs: 3.6 10*3/uL (ref 0.7–4.0)
MCH: 29.8 pg (ref 26.0–34.0)
MCHC: 32.2 g/dL (ref 30.0–36.0)
MCV: 92.3 fL (ref 78.0–100.0)
MPV: 11.5 fL (ref 8.6–12.4)
Monocytes Absolute: 0.4 10*3/uL (ref 0.1–1.0)
Monocytes Relative: 5 % (ref 3–12)
NEUTROS ABS: 4.6 10*3/uL (ref 1.7–7.7)
Neutrophils Relative %: 53 % (ref 43–77)
PLATELETS: 216 10*3/uL (ref 150–400)
RBC: 4.3 MIL/uL (ref 3.87–5.11)
RDW: 14.1 % (ref 11.5–15.5)
WBC: 8.7 10*3/uL (ref 4.0–10.5)

## 2015-03-09 LAB — GLUCOSE, CAPILLARY: GLUCOSE-CAPILLARY: 220 mg/dL — AB (ref 70–99)

## 2015-03-09 LAB — POCT GLYCOSYLATED HEMOGLOBIN (HGB A1C): Hemoglobin A1C: 9.5

## 2015-03-09 MED ORDER — INSULIN DETEMIR 100 UNIT/ML FLEXPEN: 48.0000 [IU] | PEN_INJECTOR | Freq: Every day | SUBCUTANEOUS | Status: DC

## 2015-03-09 MED ORDER — PRAVASTATIN SODIUM 40 MG PO TABS: 40.0000 mg | ORAL_TABLET | Freq: Every evening | ORAL | Status: AC

## 2015-03-09 MED ORDER — GLUCOSE BLOOD VI STRP: ORAL_STRIP | Status: DC

## 2015-03-09 NOTE — Patient Instructions (Addendum)
-  I refilled your pravastatin  -Your A1c is 9.5 from 7.8, increase your Levemir to 48 U start and start checking your sugars, please come back in 2 weeks  -Will check your labs and call you with the results  -Will refer you to endocrinology -Nice meeting you today!   General Instructions:   Thank you for bringing your medicines today. This helps [place] keep you safe from mistakes.   Progress Toward Treatment Goals:  Treatment Goal 11/13/2014  Hemoglobin A1C unchanged  Blood pressure at goal    Self Care Goals & Plans:  Self Care Goal 11/13/2014  Manage my medications take my medicines as prescribed; bring my medications to every visit; refill my medications on time  Monitor my health keep track of my blood glucose  Eat healthy foods drink diet soda or water instead of juice or soda; eat more vegetables  Be physically active take a walk every day  Meeting treatment goals maintain the current self-care plan    Home Blood Glucose Monitoring 11/13/2014  Check my blood sugar once a day  When to check my blood sugar before breakfast     Care Management & Community Referrals:  Referral 11/13/2014  Referrals made for care management support diabetes educator  Referrals made to community resources none

## 2015-03-09 NOTE — Progress Notes (Signed)
Patient ID: Michele Andrews, female   DOB: 08/05/1951, 64 y.o.   MRN: [id number]     Subjective:   Patient ID: Michele Andrews female   DOB: Nov 22, 1951 64 y.o.   MRN: [id number]  HPI: Michele Andrews is a 64 y.o. very pleasant woman with past medical history of hypertension, hyperlipidemia, insulin-dependent Type 2 DM, primary hyperparathyroidism, osteopenia, and GERD who presents for ED and routine follow-up.   Her last A1c was 7.8 on 11/23/14. She reports compliance with taking Levemir 44 U daily and metformin 1000 mg BID. She was checking her blood sugar once daily but recently lost her glucose meter and news a new one. She denies symptomatic hypoglycemia, polydipsia, polyuria, polyphagia, blurry vision, neuropathy, or foot injury/ulcer. She has not been following a healthy diet or exercising regularly. Her weight has been stable.   She reports compliance with taking lasix and lisinopril for hypertension. She has occasional headache but denies chest pain, cough, LE edema, or lightheadedness.   She has not been compliant with taking pravastatin for hyperlipidemia.   She has history of primary hyperparathyoidism and is on moderately restricted calcium and vitamin D diet. She is not on calcium/vitamin D supplementation. Her last calcium level was 11.4 and PTH level 77 on 08/03/14. She had DEXA scan on 11/30/14 that revelaed osteopenia. She denies history of nephrolithiasis or history of fracture. She has never seen endocrinology in the past. She is agreeable to surgery if warranted.     Past Medical History  Diagnosis Date  . Hypertension   . Diabetes mellitus   . Hypercalcemia   . Hyperlipidemia   . Carpal tunnel syndrome   . Rotator cuff syndrome of right shoulder   . Osteopenia    Current Outpatient Prescriptions  Medication Sig Dispense Refill  . Blood Glucose Monitoring Suppl (TRUETRACK BLOOD GLUCOSE) W/DEVICE KIT Use to check blood sugar 2 times a day     . furosemide (LASIX)  40 MG tablet Take 1 tablet (40 mg total) by mouth daily. 60 tablet 3  . glucose blood (FREESTYLE TEST STRIPS) test strip Use to check blood sugar 3 to 4 times daily. diag code 250.00. Insulin dependent 100 each 6  . ibuprofen (ADVIL,MOTRIN) 800 MG tablet Take 1 tablet (800 mg total) by mouth 3 (three) times daily. 30 tablet 0  . Insulin Detemir (LEVEMIR) 100 UNIT/ML Pen Inject 44 Units into the skin at bedtime. 15 mL 11  . Insulin Pen Needle 31G X 5 MM MISC 1 each by Does not apply route 3 (three) times daily before meals. 100 each 5  . Lancets (ACCU-CHEK SOFT TOUCH) lancets Use to check blood sugar 2 times a day 100 each 5  . lisinopril (PRINIVIL,ZESTRIL) 10 MG tablet Take 1 tablet (10 mg total) by mouth daily. 90 tablet 4  . metaxalone (SKELAXIN) 800 MG tablet Take 1 tablet (800 mg total) by mouth 3 (three) times daily. As muscle relaxer 30 tablet 1  . metFORMIN (GLUCOPHAGE) 1000 MG tablet Take 1 tablet (1,000 mg total) by mouth 2 (two) times daily with a meal. 180 tablet 4  . omeprazole (PRILOSEC) 20 MG capsule Take 1 capsule (20 mg total) by mouth daily before breakfast. 90 capsule 0  . pravastatin (PRAVACHOL) 40 MG tablet TAKE 1 TABLET BY MOUTH ONCE DAILY 90 tablet 3   No current facility-administered medications for this visit.   Family History  Problem Relation Age of Onset  . Hypertension Mother   . Cancer Mother  History   Social History  . Marital Status: Married    Spouse Name: N/A  . Number of Children: N/A  . Years of Education: 12   Occupational History  . nurse tech [employer]   Social History Main Topics  . Smoking status: Never Smoker   . Smokeless tobacco: Not on file  . Alcohol Use: No  . Drug Use: No  . Sexual Activity: Not on file   Other Topics Concern  . None   Social History Narrative   Review of Systems: Review of Systems  Constitutional: Negative for fever and chills.  Eyes: Negative for blurred vision.  Respiratory: Negative for cough and  shortness of breath.   Cardiovascular: Negative for chest pain and leg swelling.  Gastrointestinal: Negative for nausea, vomiting, abdominal pain, diarrhea and constipation.  Genitourinary: Negative for dysuria, urgency, frequency and hematuria.  Musculoskeletal: Positive for back pain (improved). Negative for myalgias and falls.  Neurological: Positive for headaches. Negative for dizziness and sensory change.  Endo/Heme/Allergies: Negative for polydipsia.    Objective:  Physical Exam: Filed Vitals:   03/09/15 1453  BP: 143/77  Pulse: 113  Temp: 98.7 F (37.1 C)  TempSrc: Oral  Weight: 174 lb 14.4 oz (79.334 kg)  SpO2: 100%   Physical Exam  Constitutional: She is oriented to person, place, and time. She appears well-developed and well-nourished. No distress.  HENT:  Head: Normocephalic and atraumatic.  Right Ear: External ear normal.  Left Ear: External ear normal.  Nose: Nose normal.  Mouth/Throat: Oropharynx is clear and moist. No oropharyngeal exudate.  Eyes: Conjunctivae and EOM are normal. Pupils are equal, round, and reactive to light. Right eye exhibits no discharge. Left eye exhibits no discharge. No scleral icterus.  Neck: Normal range of motion. Neck supple.  Cardiovascular: Normal rate and regular rhythm.   Pulmonary/Chest: Effort normal and breath sounds normal. No respiratory distress. She has no wheezes. She has no rales.  Abdominal: Soft. Bowel sounds are normal. She exhibits no distension. There is no tenderness. There is no rebound and no guarding.  Musculoskeletal: Normal range of motion. She exhibits no edema or tenderness.  Neurological: She is alert and oriented to person, place, and time.  Skin: Skin is warm and dry. No rash noted. She is not diaphoretic. No erythema. No pallor.  Psychiatric: She has a normal mood and affect. Her behavior is normal. Judgment and thought content normal.    Assessment & Plan:   Please see problem list for problem-based  assessment and plan

## 2015-03-10 DIAGNOSIS — E559 Vitamin D deficiency, unspecified: Secondary | ICD-10-CM | POA: Insufficient documentation

## 2015-03-10 LAB — PTH, INTACT AND CALCIUM
CALCIUM TOTAL (PTH): 11.1 mg/dL — AB (ref 8.7–10.3)
PTH: 75 pg/mL — AB (ref 15–65)

## 2015-03-10 LAB — LIPID PANEL
CHOL/HDL RATIO: 3.5 ratio
CHOLESTEROL: 196 mg/dL (ref 0–200)
HDL: 56 mg/dL (ref >=46)
LDL Cholesterol: 73 mg/dL (ref 0–99)
Triglycerides: 336 mg/dL — ABNORMAL HIGH (ref <150)
VLDL: 67 mg/dL — ABNORMAL HIGH (ref 0–40)

## 2015-03-10 LAB — HIV ANTIBODY (ROUTINE TESTING W REFLEX): HIV: NONREACTIVE

## 2015-03-10 NOTE — Assessment & Plan Note (Addendum)
Assessment: Pt with last A1c of 7.8 on 11/13/14 compliant with insulin and oral hypoglycemic therapy with no symptomatic hypoglycemia who presents with blood CBG of 220 and worsened A1c of 9.5.  Plan:  -A1c 9.5 not at goal <7, increase Lantus from 44 U to 48 U daily and continue metformin 1000 mg BID. [name] Plyler gave her glucose meter. Pt instructed to return in 2 weeks with glucose meter for further insulin adjustment.  -BP 143/77 near goal <140/90, continue furosemide 40 mg daily and lisinopril 10 mg daily  -Obtain annual lipid panel, LDL 73 at goal <100, continue pravastatin 40 mg daily  -Last annual eye and foot exam on 11/13/14 -Last annual urine microalbumin normal on 4/11/2, repeat at next visit (could not provide specimen) -BMI 30.99 not at goal <25, encourage weight loss

## 2015-03-10 NOTE — Assessment & Plan Note (Signed)
Assessment: Pt with chronic primary hyperparathyroidism since 2009 with last PTH level of 77 on 08/03/14 and history of osteopenia on last DEXA on 11/30/14 with Ca >1 above ULN and no history of nephrolithiasis who presents with no symptoms of hypercalcemia.    Plan:  -Obtain PTH with calcium ----> 75 and 11.1 respectively   -Refer to endocrinology for consultation regarding surgery as she meets +2 criteria (calcium level & BMD score), pt is agreeable to surgery

## 2015-03-10 NOTE — Assessment & Plan Note (Addendum)
-  Obtain annual HIV antibody screening ---> non-reactive -Obtain CBC w/diff (last one 10/02/12) ----> normal

## 2015-03-10 NOTE — Assessment & Plan Note (Signed)
Assessment: Pt with last lipid panel on 03/05/13 that was normal recently noncompliant with moderate-intensity statin therapy with 10-yr ASCVD risk of 24.3% with recommendations to continue moderate to high intensity statin therapy.   Plan:  -Obtain annual lipid panel  -Obtain CMP ---> normal liver function  -Continue pravastatin 40 mg daily -Monitor for myalgias

## 2015-03-10 NOTE — Assessment & Plan Note (Signed)
Assessment: Pt with well-controlled hypertension compliant with two-class (diuretic & ACEi) anti-hypertensive therapy who presents with blood pressure of 143/77.   Plan:  -BP 143/77 near goal <140/90 -Continue furosemide 40 mg daily and lisinopril 10 mg daily  -Obtain CMP---> sodium mildly low at 134

## 2015-03-11 NOTE — Progress Notes (Signed)
Internal Medicine Clinic Attending  Case discussed with Dr. Rabbani soon after the resident saw the patient.  We reviewed the resident's history and exam and pertinent patient test results.  I agree with the assessment, diagnosis, and plan of care documented in the resident's note.  

## 2015-03-23 ENCOUNTER — Encounter: Payer: Self-pay | Admitting: Internal Medicine

## 2015-03-23 ENCOUNTER — Ambulatory Visit (INDEPENDENT_AMBULATORY_CARE_PROVIDER_SITE_OTHER): Payer: No Typology Code available for payment source | Admitting: Internal Medicine

## 2015-03-23 VITALS — BP 130/79 | HR 88 | Temp 97.8°F | Ht 63.0 in | Wt 171.2 lb

## 2015-03-23 DIAGNOSIS — E1165 Type 2 diabetes mellitus with hyperglycemia: Secondary | ICD-10-CM

## 2015-03-23 DIAGNOSIS — Z794 Long term (current) use of insulin: Secondary | ICD-10-CM

## 2015-03-23 DIAGNOSIS — IMO0001 Reserved for inherently not codable concepts without codable children: Secondary | ICD-10-CM

## 2015-03-23 MED ORDER — INSULIN DETEMIR 100 UNIT/ML FLEXPEN: 54.0000 [IU] | PEN_INJECTOR | Freq: Every day | SUBCUTANEOUS | Status: DC

## 2015-03-23 NOTE — Progress Notes (Signed)
Internal Medicine Clinic Attending  Case discussed with Dr. Wilson at the time of the visit.  We reviewed the resident's history and exam and pertinent patient test results.  I agree with the assessment, diagnosis, and plan of care documented in the resident's note.  

## 2015-03-23 NOTE — Patient Instructions (Signed)
1. I am increasing your Levemir from 48 units to 54 units daily.  Please take it at the same time every day.  Continue to take your metformin 1000mg  twice per day.  Please check your blood sugar at least once daily (first thing in the morning before eating).  Please try to check a second time in the evening as well.  2. Please take all medications as prescribed.    3. If you have worsening of your symptoms or new symptoms arise, please call the clinic ([phone]), or go to the ER immediately if symptoms are severe.

## 2015-03-23 NOTE — Progress Notes (Signed)
   Subjective:    Patient ID: Michele Andrews, female    DOB: [birth year], 64 y.o.   MRN: [id number]  HPI Comments: Michele Andrews is a 64 year old woman with PMH as below here for follow-up of DM.  Please see problem based A&P.    Past Medical History  Diagnosis Date  . Hypertension   . Diabetes mellitus   . Hypercalcemia   . Hyperlipidemia   . Carpal tunnel syndrome   . Rotator cuff syndrome of right shoulder   . Osteopenia     Review of Systems  Constitutional: Negative for fever, chills and appetite change.  Respiratory: Negative for shortness of breath.   Cardiovascular: Negative for chest pain.  Gastrointestinal: Negative for nausea, vomiting, abdominal pain, diarrhea, constipation and blood in stool.  Endocrine: Negative for polydipsia and polyuria.  Genitourinary: Negative for dysuria and hematuria.       Filed Vitals:   03/23/15 1551  BP: 130/79  Pulse: 88  Temp: 97.8 F (36.6 C)  TempSrc: Oral  Height: 5\' 3"  (1.6 m)  Weight: 171 lb 3.2 oz (77.656 kg)  SpO2: 100%   Objective:   Physical Exam  Constitutional: She is oriented to person, place, and time. She appears well-developed. No distress.  HENT:  Head: Normocephalic and atraumatic.  Mouth/Throat: Oropharynx is clear and moist. No oropharyngeal exudate.  Eyes: EOM are normal. Pupils are equal, round, and reactive to light.  Neck: Neck supple. No thyromegaly present.  Cardiovascular: Normal rate, regular rhythm and normal heart sounds.  Exam reveals no gallop and no friction rub.   No murmur heard. Pulmonary/Chest: Effort normal and breath sounds normal. No respiratory distress. She has no wheezes. She has no rales.  Abdominal: Soft. Bowel sounds are normal. She exhibits no distension and no mass. There is no tenderness. There is no rebound and no guarding.  Musculoskeletal: Normal range of motion. She exhibits no edema or tenderness.  Neurological: She is alert and oriented to person, place, and time.  Skin:  Skin is warm. She is not diaphoretic.  Psychiatric: She has a normal mood and affect. Her behavior is normal. Judgment and thought content normal.  Vitals reviewed.         Assessment & Plan:  Please see problem based A&P.

## 2015-03-23 NOTE — Assessment & Plan Note (Addendum)
Lab Results  Component Value Date   HGBA1C 9.5 03/09/2015   HGBA1C 7.8 11/13/2014   HGBA1C 7.7 08/03/2014     Assessment: Diabetes control:  uncontrolled Progress toward A1C goal:    Comments: Patient reports her fasting CBGs have been 150-160s, lowest 140s, highest 210.  She is not feeling hypoglycemic.  Patient reports CBGs have not been as well controlled since she has been on Levemir (due to insurance changes).  She is switching again next month and hopes to be able to return her her Lantus.  She has incorporated exercise. She is not drinking sugary sodas, drinking more water.   Plan: Medications:  continue current medications:  INCREASE Levemir from 48 units to 54 units daily and CONTINUE metformin 1000mg  BID Home glucose monitoring:   Frequency:   Timing:   Instruction/counseling given: reminded to bring blood glucose meter & log to each visit Other plans: I considered BID Levemir dosing but after speaking with CDE, it seems sticking with increased single dose of Levemir (in this patient) may get [place] more extended coverage that BID dosing would not.  RTC in 1 month for follow-up with PCP.

## 2015-03-31 ENCOUNTER — Other Ambulatory Visit: Payer: Self-pay | Admitting: Internal Medicine

## 2015-03-31 DIAGNOSIS — Z1231 Encounter for screening mammogram for malignant neoplasm of breast: Secondary | ICD-10-CM

## 2015-04-06 ENCOUNTER — Ambulatory Visit (HOSPITAL_COMMUNITY)
Admission: RE | Admit: 2015-04-06 | Discharge: 2015-04-06 | Disposition: A | Discharge status: Home / Self Care | Payer: No Typology Code available for payment source | Source: Ambulatory Visit | Attending: Internal Medicine | Admitting: Internal Medicine

## 2015-04-06 DIAGNOSIS — Z1231 Encounter for screening mammogram for malignant neoplasm of breast: Secondary | ICD-10-CM | POA: Insufficient documentation

## 2015-04-08 ENCOUNTER — Ambulatory Visit: Payer: No Typology Code available for payment source | Admitting: Internal Medicine

## 2015-04-12 ENCOUNTER — Encounter: Payer: Self-pay | Admitting: Internal Medicine

## 2015-04-12 ENCOUNTER — Ambulatory Visit (INDEPENDENT_AMBULATORY_CARE_PROVIDER_SITE_OTHER): Payer: No Typology Code available for payment source | Admitting: Internal Medicine

## 2015-04-12 VITALS — BP 130/76 | HR 108 | Temp 98.5°F | Resp 12 | Ht 62.5 in | Wt 174.0 lb

## 2015-04-12 DIAGNOSIS — E213 Hyperparathyroidism, unspecified: Secondary | ICD-10-CM

## 2015-04-12 DIAGNOSIS — E559 Vitamin D deficiency, unspecified: Secondary | ICD-10-CM

## 2015-04-12 HISTORY — DX: Vitamin D deficiency, unspecified: E55.9

## 2015-04-12 NOTE — Progress Notes (Addendum)
Patient ID: Michele Andrews, female   DOB: 01-19-1951, 64 y.o.   MRN: [id number]  HPI  Michele Andrews is a 64 y.o.-year-old femalear-old female, referred by her PCP, Dr. Hulen Luster, (64-year-old) for evaluation for hypercalcemia/hyperparathyroidism.  Pt was dx with hypercalcemia in 2008. I reviewed pt's pertinent labs: Lab Results  Component Value Date   PTH 75* 03/09/2015   PTH Comment 03/09/2015   PTH 77* 08/03/2014   PTH 46.8 01/05/2012   PTH 81.2* 03/21/2011   PTH 57.4 01/10/2010   CALCIUM 10.7* 03/09/2015   CALCIUM 11.1* 03/09/2015   CALCIUM 11.9* 08/03/2014   CALCIUM 11.4* 08/03/2014   CALCIUM 10.5 03/04/2014   CALCIUM 10.7* 09/10/2013   CALCIUM 10.9* 03/05/2013   CALCIUM 10.9* 10/02/2012   CALCIUM 11.3* 01/05/2012   CALCIUM 10.8* 01/02/2012   I reviewed pt's DEXA scans: Date L1-L4 T score FN T score 33% distal Radius  11/30/2014   -1.8 (+6.3% *) right: -1.8, left: -1.6  n/a  02/08/2012  -2.0 (L2 was excluded!)  right: -1.6, left: -1.6  n/a  No fractures or falls.   No h/o kidney stones.  She collected a 24h urine for calcium in 2012 >> elevated: Component     Latest Ref Rng 03/21/2011  Calcium, Ur      24  Calcium, 24 hour urine     100 - 250 mg/day 312 (H)   No h/o CKD. Last BUN/Cr: Lab Results  Component Value Date   BUN 13 03/09/2015   CREATININE 0.65 03/09/2015   Pt is not on HCTZ.  No h/o vitamin D deficiency. Last vit D level was: Component     Latest Ref Rng 02/02/2012  Vit D, 25-Hydroxy     30 - 89 ng/mL 23 (L)   Pt is not on calcium and vitamin D; she drinks almond milk and green, leafy, vegetables.  Pt does not have a FH of hypercalcemia, pituitary tumors, thyroid cancer; mother osteoporosis.   I reviewed her chart and she also has a history of hypertension, hyperlipidemia, GERD.  ROS: Constitutional: + weight gain, no fatigue, + hot flashes Eyes: no blurry vision, no xerophthalmia ENT: no sore throat, no nodules palpated in throat, no dysphagia/odynophagia, no  hoarseness Cardiovascular: no CP/SOB/palpitations/leg swelling Respiratory: no cough/SOB Gastrointestinal: no N/V/D/C/+ heartburn Musculoskeletal: no muscle/joint aches Skin: no rashes, + hair loss Neurological: no tremors/numbness/tingling/dizziness, + headache Psychiatric: no depression/anxiety  Past Medical History  Diagnosis Date  . Hypertension   . Diabetes mellitus   . Hypercalcemia   . Hyperlipidemia   . Carpal tunnel syndrome   . Rotator cuff syndrome of right shoulder   . Osteopenia    No past surgical history on file. History   Social History  . Marital Status: Married    Spouse Name: N/A  . Number of Children: 3  . Years of Education: 12   Occupational History  . clinical sitter Previously nurse tech  [place] History Main Topics  . Smoking status: Never Smoker   . Smokeless tobacco: Not on file  . Alcohol Use: No  . Drug Use: No   Current Outpatient Prescriptions on File Prior to Visit  Medication Sig Dispense Refill  . furosemide (LASIX) 40 MG tablet Take 1 tablet (40 mg total) by mouth daily. 60 tablet 3  . glucose blood (FREESTYLE TEST STRIPS) test strip Use to check blood sugar 3 to 4 times daily. diag code 250.00. Insulin dependent 100 each 6  . ibuprofen (ADVIL,MOTRIN) 800 MG tablet  Take 1 tablet (800 mg total) by mouth 3 (three) times daily. 30 tablet 0  . Insulin Detemir (LEVEMIR) 100 UNIT/ML Pen Inject 54 Units into the skin at bedtime. 15 mL 11  . Insulin Pen Needle 31G X 5 MM MISC 1 each by Does not apply route 3 (three) times daily before meals. 100 each 5  . lisinopril (PRINIVIL,ZESTRIL) 10 MG tablet Take 1 tablet (10 mg total) by mouth daily. 90 tablet 4  . metFORMIN (GLUCOPHAGE) 1000 MG tablet Take 1 tablet (1,000 mg total) by mouth 2 (two) times daily with a meal. 180 tablet 4  . pravastatin (PRAVACHOL) 40 MG tablet Take 1 tablet (40 mg total) by mouth every evening. 30 tablet 5  . Blood Glucose Monitoring Suppl (TRUETRACK BLOOD  GLUCOSE) W/DEVICE KIT Use to check blood sugar 2 times a day     . Lancets (ACCU-CHEK SOFT TOUCH) lancets Use to check blood sugar 2 times a day (Patient not taking: Reported on 04/12/2015) 100 each 5   No current facility-administered medications on file prior to visit.   No Known Allergies  PE: BP 130/76 mmHg  Pulse 108  Temp(Src) 98.5 F (36.9 C) (Oral)  Resp 12  Ht 5' 2.5" (1.588 m)  Wt 174 lb (78.926 kg)  BMI 31.30 kg/m2  SpO2 97% Wt Readings from Last 3 Encounters:  04/12/15 174 lb (78.926 kg)  03/23/15 171 lb 3.2 oz (77.656 kg)  03/09/15 174 lb 14.4 oz (79.334 kg)   Constitutional: overweight, in NAD. No kyphosis. Eyes: PERRLA, EOMI, no exophthalmos ENT: moist mucous membranes, no thyromegaly, no cervical lymphadenopathy Cardiovascular: RRR, No MRG Respiratory: CTA B Gastrointestinal: abdomen soft, NT, ND, BS+ Musculoskeletal: no deformities, strength intact in all 4 Skin: moist, warm, no rashes Neurological: no tremor with outstretched hands, DTR normal in all 4  Assessment: 1. Hypercalcemia/hyperparathyroidism  Plan: Patient has had several instances of elevated calcium, with the highest level being at 11.9. A corresponding intact PTH level was also high, at 77.  It is unclear whether she currently has vitamin D deficiency, but she had this in the past, however, it is very likely that she does have a parathyroid adenoma based on the high PTH level with a high serum and urinary calcium.  No apparent complications from hypercalcemia: no h/o nephrolithiasis, no osteoporosis, no fractures. No abdominal pain, depression, bone pain. She does have osteopenia. - I discussed with the patient about the physiology of calcium and parathyroid hormone, and possible side effects from increased PTH, including kidney stones, osteoporosis, abdominal pain, etc.  - We discussed that we need to check whether his hyperparathyroidism is primary (Familial hypercalcemic hypocalciuria or  parathyroid adenoma) or secondary (to conditions like: vitamin D deficiency, calcium malabsorption, hypercalciuria, renal insufficiency, etc.). - Criteria for parathyroid surgery:  Increased calcium by more than 1 mg/dL above the upper limit of normal  Kidney ds.  Osteoporosis (or Vb fx) Age <68 years old New (2013): High UCa >400 mg/d and increased stone risk by biochemical stone risk analysis Presence of nephrolithiasis or nephrocalcinosis Pt's preference!  - she does meets the calcium level criterion  - We discussed possible consequences of hyperparathyroidism: ~1/3 pts will develop complications over 15 years (OP, nephrolithiasis).  - We discussed that we first need to check whether she has vitamin D deficiency and corrected before investigating the parathyroid status further  - Today we will check only her 25-hydroxy vitamin D.   If this abnormal start her on vitamin D and recheck  the level in 2 months  If this is normal, will go ahead and check calcium level intact PTH ([facility]) Magnesium Phosphorus vitamin D 1,25 HO  24h urinary calcium/creatinine ratio - pt given instructions for urine collection and the jug - If the tests indicate a parathyroid adenoma, I will refer her to surgery. We discussed briefly about the surgery. She agrees with this plan. - I will see the patient back in 4 months  - time spent with the patient: 1 hour, of which >50% was spent in obtaining information about her symptoms, reviewing her previous labs, evaluations, and treatments, counseling her about her condition (please see the discussed topics above), and developing a plan to further investigate it; she had a number of questions which I addressed.  As suspected, her vitamin D is low: Component     Latest Ref Rng 04/12/2015  VITD     30.00 - 100.00 ng/mL 16.71 (L)   Start 4000 units vitamin D daily and recheck her vitamin D in 2 months. Hold off the urine collection until vitamin D is normal.

## 2015-04-12 NOTE — Patient Instructions (Signed)
Please stop at the lab.  Patient information (Up-to-Date): Collection of a 24-hour urine specimen  - You should collect every drop of urine during each 24-hour period. It does not matter how much or little urine is passed each time, as long as every drop is collected. - Begin the urine collection in the morning after you wake up, after you have emptied your bladder for the first time. - Urinate (empty the bladder) for the first time and flush it down the toilet. Note the exact time (eg, 6:15 AM). You will begin the urine collection at this time. - Collect every drop of urine during the day and night in an empty collection bottle. Store the bottle at room temperature or in the refrigerator. - If you need to have a bowel movement, any urine passed with the bowel movement should be collected. Try not to include feces with the urine collection. If feces does get mixed in, do not try to remove the feces from the urine collection bottle. - Finish by collecting the first urine passed the next morning, adding it to the collection bottle. This should be within ten minutes before or after the time of the first morning void on the first day (which was flushed). In this example, you would try to void between 6:05 and 6:25 on the second day. - If you need to urinate one hour before the final collection time, drink a full glass of water so that you can void again at the appropriate time. If you have to urinate 20 minutes before, try to hold the urine until the proper time. - Please note the exact time of the final collection, even if it is not the same time as when collection began on day 1. - The bottle(s) may be kept at room temperature for a day or two, but should be kept cool or refrigerated for longer periods of time.  Please come back for a follow-up appointment in 4 months.  Hypercalcemia Hypercalcemia means the calcium in your blood is too high. Calcium in our blood is important for the control of many  things, such as:  Blood clotting.  Conducting of nerve impulses.  Muscle contraction.  Maintaining teeth and bone health.  Other body functions. In the bloodstream, calcium maintains a constant balance with another mineral, phosphate. Calcium is absorbed into the body through the small intestine. This is helped by vitamin D. Calcium levels are maintained mostly by vitamin D and a hormone (parathyroid hormone). But the kidneys also help. Hypercalcemia can happen when the concentration of calcium is too high for the kidneys to maintain balance. The body maintains a balance between the calcium we eat and the calcium already in our body. If calcium intake is increased or we cannot use calcium properly, there may be problems. Some common sources of calcium are:   Dairy products.  Nuts.  Eggs.  Whole grains.  Legumes.  Green leafy vegetables. CAUSES There are many causes of this condition, but some common ones are:  Hyperparathyroidism. This is an overactivity of the parathyroid gland.  Cancers of the breast, kidney, lung, head, and neck are common causes of calcium increases.  Medications that cause you to urinate more often (diuretics), nausea, vomiting, and diarrhea also increase the calcium in the blood.  Overuse of calcium-containing antacids. SYMPTOMS  Many patients with mild hypercalcemia have no symptoms. For those with symptoms, common problems include:  Loss of appetite.  Constipation.  Increased thirst.  Heart rhythm changes.  Abnormal  thinking.  Nausea.  Abdominal pain.  Kidney stones.  Mood swings.  Coma and death when severe.  Vomiting.  Increased urination.  High blood pressure.  Confusion. DIAGNOSIS   Your caregiver will do a medical history and perform a physical exam on you.  Calcium and parathyroid hormone (PTH) may be measured with a blood test. TREATMENT   The treatment depends on the calcium level and what is causing the higher  level. Hypercalcemia can be life threatening. Fast lowering of the calcium level may be necessary.  With normal kidney function, fluids can be given by vein to clear the excess calcium. Hemodialysis works well to reduce dangerous calcium levels if there is poor kidney function. This is a procedure in which a machine is used to filter out unwanted substances. The blood is then returned to the body.  Drugs, such as diuretics, can be given after adequate fluid intake is established. These medications help the kidneys get rid of extra calcium. Drugs that lessen (inhibit) bone loss are helpful in gaining long-term control. Phosphate pills help lower high calcium levels caused by a low supply of phosphate. Anti-inflammatory agents such as steroids are helpful with some cancers and toxic levels of vitamin D.  Treatment of the underlying cause of the hypercalcemia will also correct the imbalance. Hyperparathyroidism is usually treated by surgical removal of one or more of the parathyroid glands and any tissue, other than the glands themselves, that is producing too much hormone.  The hypercalcemia caused by cancer is difficult to treat without controlling the cancer. Symptoms can be improved with fluids and drug therapy as outlined above. PROGNOSIS   Surgery to remove the parathyroid glands is usually successful. This also depends on the amount of damage to the kidneys and whether or not it can be treated.  Mild hypercalcemia can be controlled with good fluid intake and the use of effective medications.  Hypercalcemia often develops as a late complication of cancer. The expected outlook is poor without effective anticancer therapy. PREVENTION   If you are at risk for developing hypercalcemia, be familiar with early symptoms. Report these to your caregiver.  Good fluid intake (up to four quarts of liquid a day if possible) is helpful.  Try to control nausea and vomiting, and treat fevers to avoid  dehydration.  Lowering the amount of calcium in your diet is not necessary. High blood calcium reduces absorption of calcium in the intestine.  Stay as active as possible. SEEK IMMEDIATE MEDICAL CARE IF:   You develop chest pain, sweating, or shortness of breath.  You get confused, feel faint or pass out.  You develop severe nausea and vomiting. MAKE SURE YOU:   Understand these instructions.  Will watch your condition.  Will get help right away if you are not doing well or get worse. Document Released: 02/10/2005 Document Revised: 04/13/2014 Document Reviewed: 11/22/2010 [facility] Patient Information 2015 [place], [place]. This information is not intended to replace advice given to you by your health care provider. Make sure you discuss any questions you have with your health care provider.

## 2015-04-13 LAB — VITAMIN D 25 HYDROXY (VIT D DEFICIENCY, FRACTURES): VITD: 16.71 ng/mL — ABNORMAL LOW (ref 30.00–100.00)

## 2015-04-15 ENCOUNTER — Telehealth: Payer: Self-pay | Admitting: Internal Medicine

## 2015-04-15 NOTE — Telephone Encounter (Deleted)
Patient is returning your call disregard message

## 2015-04-15 NOTE — Telephone Encounter (Signed)
Error

## 2015-06-03 ENCOUNTER — Other Ambulatory Visit: Payer: Self-pay | Admitting: *Deleted

## 2015-06-03 ENCOUNTER — Telehealth: Payer: Self-pay | Admitting: Internal Medicine

## 2015-06-03 MED ORDER — METFORMIN HCL 1000 MG PO TABS: 1000.0000 mg | ORAL_TABLET | Freq: Two times a day (BID) | ORAL | Status: DC

## 2015-06-03 NOTE — Telephone Encounter (Signed)
Patient asking for a refill for metFORMIN (GLUCOPHAGE) 1000 MG tablet be sent to [facility]

## 2015-06-04 NOTE — Telephone Encounter (Signed)
Rx filled

## 2015-07-14 ENCOUNTER — Encounter: Payer: No Typology Code available for payment source | Admitting: Internal Medicine

## 2015-07-28 ENCOUNTER — Ambulatory Visit (INDEPENDENT_AMBULATORY_CARE_PROVIDER_SITE_OTHER): Payer: Commercial Managed Care - HMO | Admitting: Internal Medicine

## 2015-07-28 ENCOUNTER — Encounter: Payer: Self-pay | Admitting: Internal Medicine

## 2015-07-28 VITALS — BP 126/68 | HR 105 | Temp 98.1°F | Ht 62.0 in | Wt 174.4 lb

## 2015-07-28 DIAGNOSIS — I10 Essential (primary) hypertension: Secondary | ICD-10-CM | POA: Diagnosis not present

## 2015-07-28 DIAGNOSIS — Z794 Long term (current) use of insulin: Secondary | ICD-10-CM | POA: Diagnosis not present

## 2015-07-28 DIAGNOSIS — E559 Vitamin D deficiency, unspecified: Secondary | ICD-10-CM | POA: Diagnosis not present

## 2015-07-28 DIAGNOSIS — E1165 Type 2 diabetes mellitus with hyperglycemia: Secondary | ICD-10-CM | POA: Diagnosis not present

## 2015-07-28 DIAGNOSIS — Z79899 Other long term (current) drug therapy: Secondary | ICD-10-CM | POA: Diagnosis not present

## 2015-07-28 DIAGNOSIS — R000 Tachycardia, unspecified: Secondary | ICD-10-CM | POA: Insufficient documentation

## 2015-07-28 DIAGNOSIS — Z0000 Encounter for general adult medical examination without abnormal findings: Secondary | ICD-10-CM

## 2015-07-28 DIAGNOSIS — IMO0001 Reserved for inherently not codable concepts without codable children: Secondary | ICD-10-CM

## 2015-07-28 LAB — POCT GLYCOSYLATED HEMOGLOBIN (HGB A1C): Hemoglobin A1C: 9

## 2015-07-28 LAB — GLUCOSE, CAPILLARY: Glucose-Capillary: 173 mg/dL — ABNORMAL HIGH (ref 65–99)

## 2015-07-28 MED ORDER — INSULIN DETEMIR 100 UNIT/ML FLEXPEN: 58.0000 [IU] | PEN_INJECTOR | Freq: Every day | SUBCUTANEOUS | Status: DC

## 2015-07-28 NOTE — Assessment & Plan Note (Addendum)
HR 105, denies SOB or chest pain. Normally runs in the high 80's-100's. On exam pt has regular rhythm. TSH WNL in 2013. - continue to follow, can get an EKG if not in sinus rhythm on exam or having symptoms.

## 2015-07-28 NOTE — Progress Notes (Signed)
   Subjective:    Patient ID: Michele Andrews, female    DOB: 1951-02-06, 64 y.o.   MRN: [id number]  HPI Pt is a 64 y/o female w/ PMH of DM, primary hyperparathyroidism, HTN, and GERD who presents for diabetes follow up. Please see problem list for further details.       Review of Systems  Eyes: Negative for visual disturbance.  Respiratory: Negative for shortness of breath.   Cardiovascular: Positive for palpitations. Negative for chest pain.  Endocrine: Negative for polydipsia and polyuria.       Objective:   Physical Exam  Constitutional: She appears well-developed and well-nourished. No distress.  Cardiovascular: Regular rhythm and normal heart sounds.  Tachycardia present.  Exam reveals no gallop and no friction rub.   No murmur heard. Pulmonary/Chest: Effort normal and breath sounds normal. No respiratory distress. She has no wheezes. She has no rales.  Abdominal: Soft. Bowel sounds are normal. She exhibits no distension. There is no tenderness. There is no rebound.  Musculoskeletal: She exhibits no edema.  Skin: Skin is warm and dry.  Vitals reviewed.         Assessment & Plan:  Please see problem based assessment and plan.

## 2015-07-28 NOTE — Patient Instructions (Signed)
Start taking levemir 58 units at night. Check your blood sugars THREE times a day before meals. We will see you in 1 month for diabetes follow up.

## 2015-07-28 NOTE — Assessment & Plan Note (Signed)
Low Vit D of 16.71 on 5/2. Compliant w/ Vit D 4000 units. Will recheck Vit D level today.

## 2015-07-28 NOTE — Assessment & Plan Note (Signed)
Lab Results  Component Value Date   HGBA1C 9.0 07/28/2015   HGBA1C 9.5 03/09/2015   HGBA1C 7.8 11/13/2014     Assessment: Diabetes control: poor control (HgbA1C >9%) Progress toward A1C goal:  improved Comments: on levemir 54 units qhs and metformin 1000mg  BID. States she is complaint with meds. Has not been checking sugars often, ranages from 160-180, was 132 this morning. Non adherent to DM diet, eats lots of bread and drinks diet sodas.   Plan: Medications: cont metformin, increased levemir to 58 units qhs. Instructed to check sugars TID before meals. Discussed novolog and glipizide options if A1c still elevated.  Home glucose monitoring: Frequency:  TID Timing:  before meals Instruction/counseling given: reminded to bring blood glucose meter & log to each visit, discussed the need for weight loss and discussed diet Educational resources provided:   Self management tools provided:   Other plans: f/u in 1 month to review glucometer report. Pt is going to work on diet and exercise, will assess weight at next visit.

## 2015-07-28 NOTE — Assessment & Plan Note (Signed)
Referred for colonoscopy. Pt agreeable to this.

## 2015-07-28 NOTE — Assessment & Plan Note (Signed)
BP Readings from Last 3 Encounters:  07/28/15 126/68  04/12/15 130/76  03/23/15 130/79    Lab Results  Component Value Date   NA 134* 03/09/2015   K 3.7 03/09/2015   CREATININE 0.65 03/09/2015    Assessment: Blood pressure control: controlled Progress toward BP goal:  at goal Comments: compliant w/ meds, no refills needed at this time  Plan: Medications:  continue current medications Educational resources provided:   Self management tools provided:   Other plans: continue to follow BP. Pt is going to work on weight loss.

## 2015-07-29 LAB — VITAMIN D 25 HYDROXY (VIT D DEFICIENCY, FRACTURES): VIT D 25 HYDROXY: 44.8 ng/mL (ref 30.0–100.0)

## 2015-08-04 ENCOUNTER — Encounter: Payer: Self-pay | Admitting: Gastroenterology

## 2015-08-05 NOTE — Progress Notes (Signed)
[facility] Clinic Attending  Case discussed with Dr. Truong soon after the resident saw the patient.  We reviewed the resident's history and exam and pertinent patient test results.  I agree with the assessment, diagnosis, and plan of care documented in the resident's note. 

## 2015-08-13 ENCOUNTER — Encounter: Payer: Self-pay | Admitting: Internal Medicine

## 2015-08-13 ENCOUNTER — Ambulatory Visit (INDEPENDENT_AMBULATORY_CARE_PROVIDER_SITE_OTHER): Payer: Commercial Managed Care - HMO | Admitting: Internal Medicine

## 2015-08-13 VITALS — BP 102/68 | HR 92 | Temp 98.3°F | Resp 12 | Wt 174.8 lb

## 2015-08-13 DIAGNOSIS — E213 Hyperparathyroidism, unspecified: Secondary | ICD-10-CM

## 2015-08-13 DIAGNOSIS — E559 Vitamin D deficiency, unspecified: Secondary | ICD-10-CM

## 2015-08-13 LAB — MAGNESIUM: MAGNESIUM: 1.8 mg/dL (ref 1.5–2.5)

## 2015-08-13 LAB — BASIC METABOLIC PANEL WITH GFR
BUN: 9 mg/dL (ref 7–25)
CALCIUM: 12.1 mg/dL — AB (ref 8.6–10.4)
CHLORIDE: 100 mmol/L (ref 98–110)
CO2: 30 mmol/L (ref 20–31)
Creat: 0.66 mg/dL (ref 0.50–0.99)
GFR, Est African American: >89 mL/min (ref >=60)
GLUCOSE: 116 mg/dL — AB (ref 65–99)
Potassium: 4.3 mmol/L (ref 3.5–5.3)
SODIUM: 139 mmol/L (ref 135–146)

## 2015-08-13 LAB — PHOSPHORUS: Phosphorus: 3.2 mg/dL (ref 2.3–4.6)

## 2015-08-13 LAB — VITAMIN D 25 HYDROXY (VIT D DEFICIENCY, FRACTURES): VITD: 43.63 ng/mL (ref 30.00–100.00)

## 2015-08-13 NOTE — Progress Notes (Signed)
Patient ID: Michele Andrews, female   DOB: 1951/10/31, 64 y.o.   MRN: [id number]  HPI  Michele Andrews is a 64 y.o.-year-old female, returning for f/u for hypercalcemia/hyperparathyroidism and vit D deficiency.  Has been through a lot of stress: granddaughter (60 y/o) died of cancer.  Reviewed and addended hx: Pt was dx with hypercalcemia in 2008. I reviewed pt's pertinent labs: Lab Results  Component Value Date   PTH 75* 03/09/2015   PTH Comment 03/09/2015   PTH 77* 08/03/2014   PTH 46.8 01/05/2012   PTH 81.2* 03/21/2011   PTH 57.4 01/10/2010   CALCIUM 10.7* 03/09/2015   CALCIUM 11.1* 03/09/2015   CALCIUM 11.9* 08/03/2014   CALCIUM 11.4* 08/03/2014   CALCIUM 10.5 03/04/2014   CALCIUM 10.7* 09/10/2013   CALCIUM 10.9* 03/05/2013   CALCIUM 10.9* 10/02/2012   CALCIUM 11.3* 01/05/2012   CALCIUM 10.8* 01/02/2012   I reviewed pt's DEXA scans: Date L1-L4 T score FN T score 33% distal Radius  11/30/2014   -1.8 (+6.3% *) right: -1.8, left: -1.6  n/a  02/08/2012  -2.0 (L2 was excluded!)  right: -1.6, left: -1.6  n/a  No fractures or falls.   No h/o kidney stones.  She collected a 24h urine for calcium in 2012 >> elevated: Component     Latest Ref Rng 03/21/2011  Calcium, Ur      24  Calcium, 24 hour urine     100 - 250 mg/day 312 (H)   No h/o CKD. Last BUN/Cr: Lab Results  Component Value Date   BUN 13 03/09/2015   CREATININE 0.65 03/09/2015   Pt is not on HCTZ.  No h/o vitamin D deficiency. Last vit D level was: Component     Latest Ref Rng 04/12/2015  VITD     30.00 - 100.00 ng/mL 16.71 (L)   We started 4000 units vitamin D daily >> recheck her vitamin D by PCP 2 weeks ago >> normalized: Component     Latest Ref Rng 07/28/2015  Vit D, 25-Hydroxy     30.0 - 100.0 ng/mL 44.8   No FH of hypercalcemia, pituitary tumors, thyroid cancer; mother osteoporosis.   She also has a history of hypertension, hyperlipidemia, GERD.  ROS: Constitutional: no weight loss/gain, no  fatigue, + hot flashes Eyes: no blurry vision, no xerophthalmia ENT: no sore throat, no nodules palpated in throat, no dysphagia/odynophagia, no hoarseness Cardiovascular: no CP/SOB/palpitations/leg swelling Respiratory: no cough/SOB Gastrointestinal: no N/V/D/C/heartburn Musculoskeletal: no muscle/joint aches Skin: no rashes, + hair loss Neurological: no tremors/numbness/tingling/dizziness  I reviewed pt's medications, allergies, PMH, social hx, family hx, and changes were documented in the history of present illness. Otherwise, unchanged from my initial visit note:  Past Medical History  Diagnosis Date  . Hypertension   . Diabetes mellitus   . Hypercalcemia   . Hyperlipidemia   . Carpal tunnel syndrome   . Rotator cuff syndrome of right shoulder   . Osteopenia    No past surgical history on file. History   Social History  . Marital Status: Married    Spouse Name: N/A  . Number of Children: 3  . Years of Education: 12   Occupational History  . clinical sitter Previously nurse tech  [place] History Main Topics  . Smoking status: Never Smoker   . Smokeless tobacco: Not on file  . Alcohol Use: No  . Drug Use: No   Current Outpatient Prescriptions on File Prior to Visit  Medication Sig Dispense  Refill  . Blood Glucose Monitoring Suppl (TRUETRACK BLOOD GLUCOSE) W/DEVICE KIT Use to check blood sugar 2 times a day     . furosemide (LASIX) 40 MG tablet Take 1 tablet (40 mg total) by mouth daily. 60 tablet 3  . glucose blood (FREESTYLE TEST STRIPS) test strip Use to check blood sugar 3 to 4 times daily. diag code 250.00. Insulin dependent 100 each 6  . Insulin Detemir (LEVEMIR) 100 UNIT/ML Pen Inject 58 Units into the skin at bedtime. 15 mL 11  . Insulin Pen Needle 31G X 5 MM MISC 1 each by Does not apply route 3 (three) times daily before meals. 100 each 5  . Lancets (ACCU-CHEK SOFT TOUCH) lancets Use to check blood sugar 2 times a day (Patient not taking:  Reported on 04/12/2015) 100 each 5  . lisinopril (PRINIVIL,ZESTRIL) 10 MG tablet Take 1 tablet (10 mg total) by mouth daily. 90 tablet 4  . metFORMIN (GLUCOPHAGE) 1000 MG tablet Take 1 tablet (1,000 mg total) by mouth 2 (two) times daily with a meal. 180 tablet 4  . pravastatin (PRAVACHOL) 40 MG tablet Take 1 tablet (40 mg total) by mouth every evening. 30 tablet 5   No current facility-administered medications on file prior to visit.   No Known Allergies  PE: BP 102/68 mmHg  Pulse 92  Temp(Src) 98.3 F (36.8 C) (Oral)  Resp 12  Wt 174 lb 12.8 oz (79.289 kg)  SpO2 98% Body mass index is 31.96 kg/(m^2). Wt Readings from Last 3 Encounters:  08/13/15 174 lb 12.8 oz (79.289 kg)  07/28/15 174 lb 6.4 oz (79.107 kg)  04/12/15 174 lb (78.926 kg)   Constitutional: overweight, in NAD. No kyphosis. Eyes: PERRLA, EOMI, no exophthalmos ENT: moist mucous membranes, no thyromegaly, no cervical lymphadenopathy Cardiovascular: RRR, No MRG Respiratory: CTA B Gastrointestinal: abdomen soft, NT, ND, BS+ Musculoskeletal: no deformities, strength intact in all 4 Skin: moist, warm, no rashes Neurological: no tremor with outstretched hands, DTR normal in all 4  Assessment: 1. Hypercalcemia/hyperparathyroidism  2. Vitamin D deficiency  Plan: 1. And 2. Patient has had several instances of elevated calcium, with the highest level being at 11.9. A corresponding intact PTH level was also high, at 77. A 24-hour urine calcium was elevated in 2012. No apparent complications from hypercalcemia: no h/o nephrolithiasis, no osteoporosis, no fractures. No abdominal pain, depression, bone pain. She does have osteopenia. - At last visit, as the patient also had vitamin D deficiency, so we started vitamin D supplementation, with 4000 units daily. At last check in PCPs office, 2 weeks ago, vitamin D normalized, at 44. The plan was to further investigate a parathyroid hormone elevation after normalization of vitamin  D. - I again discussed with the patient about the physiology of calcium and parathyroid hormone, and possible side effects from increased PTH, including kidney stones, osteoporosis, abdominal pain, etc.  - Criteria for parathyroid surgery are:  Increased calcium by more than 1 mg/dL above the upper limit of normal  Kidney ds.  Osteoporosis (or Vb fx) Age <93 years old New (2013): High UCa >400 mg/d and increased stone risk by biochemical stone risk analysis Presence of nephrolithiasis or nephrocalcinosis Pt's preference!  - she does meets the calcium level criterion  - Now that the vitamin D has normalized, we can check if she has primary versus secondary hyperparathyroidism We'll check the following labs today: calcium level intact PTH ([facility]) Magnesium Phosphorus vitamin D 1,25 HO  (Depending on the above labs, I  may need to repeat a 24h urinary calcium/creatinine ratio) - If the tests indicate a parathyroid adenoma, I will refer her to surgery. We discussed briefly about the surgery. She agrees with this plan. - I will see the patient back in 3 months. At that visit, per her request, will also address her DM2.  Component     Latest Ref Rng 08/13/2015          Sodium     135 - 146 mmol/L 139  Potassium     3.5 - 5.3 mmol/L 4.3  Chloride     98 - 110 mmol/L 100  CO2     20 - 31 mmol/L 30  Glucose     65 - 99 mg/dL 116 (H)  BUN     7 - 25 mg/dL 9  Creatinine     0.50 - 0.99 mg/dL 0.66  Calcium     8.6 - 10.4 mg/dL 12.1 (H)  GFR, Est African American     >=60 mL/min >89  GFR, Est Non African American     >=60 mL/min >89  Vitamin D 1, 25 (OH) Total     18 - 72 pg/mL 84 (H)  Vitamin D3 1, 25 (OH)      84  Vitamin D2 1, 25 (OH)      <8  PTH     15 - 65 pg/mL 35  Phosphorus     2.3 - 4.6 mg/dL 3.2  VITD     30.00 - 100.00 ng/mL 43.63  Magnesium     1.5 - 2.5 mg/dL 1.8   Labs point towards primary hyperparathyroidism, with a very high calcium, unsuppressed PTH,  high 1, 25 dihydroxy vitamin D and normal 25 hydroxy vitamin D. Phosphorus is slightly higher than I expected, at 3.2, however, this is consistent with PTH value within the normal range. Of note, patient also had a high urinary calcium in the past. I do not feel that we need to repeat this now.  I will refer patient to surgery.

## 2015-08-13 NOTE — Patient Instructions (Signed)
Please stop at the lab.  Please come back for a follow-up appointment in 3 months  

## 2015-08-14 LAB — PARATHYROID HORMONE, INTACT (NO CA): PTH: 35 pg/mL (ref 15–65)

## 2015-08-17 LAB — VITAMIN D 1,25 DIHYDROXY
VITAMIN D3 1, 25 (OH): 84 pg/mL
Vitamin D 1, 25 (OH)2 Total: 84 pg/mL — ABNORMAL HIGH (ref 18–72)

## 2015-08-31 ENCOUNTER — Other Ambulatory Visit: Payer: Self-pay | Admitting: Surgery

## 2015-08-31 DIAGNOSIS — E210 Primary hyperparathyroidism: Secondary | ICD-10-CM | POA: Diagnosis not present

## 2015-08-31 DIAGNOSIS — E213 Hyperparathyroidism, unspecified: Secondary | ICD-10-CM

## 2015-09-06 ENCOUNTER — Encounter (HOSPITAL_COMMUNITY)
Admission: RE | Admit: 2015-09-06 | Discharge: 2015-09-06 | Disposition: A | Discharge status: Home / Self Care | Payer: Commercial Managed Care - HMO | Source: Ambulatory Visit | Attending: Surgery | Admitting: Surgery

## 2015-09-06 DIAGNOSIS — E213 Hyperparathyroidism, unspecified: Secondary | ICD-10-CM | POA: Diagnosis not present

## 2015-09-06 MED ORDER — TECHNETIUM TC 99M SESTAMIBI - CARDIOLITE
24.5000 | Freq: Once | INTRAVENOUS | Status: AC | PRN
  Administered 2015-09-06: 25 via INTRAVENOUS

## 2015-09-09 ENCOUNTER — Other Ambulatory Visit: Payer: Self-pay | Admitting: Internal Medicine

## 2015-09-10 ENCOUNTER — Telehealth: Payer: Self-pay | Admitting: *Deleted

## 2015-09-10 NOTE — Telephone Encounter (Signed)
Pharm called to question the dispense amt #60 w/ 3 refills with directions of 1 tab daily, changed to #90 for a 3 month supply, please change the medlist if you agree from 60 to 90

## 2015-09-14 ENCOUNTER — Ambulatory Visit: Payer: Self-pay | Admitting: Surgery

## 2015-09-15 MED ORDER — FUROSEMIDE 40 MG PO TABS: 40.0000 mg | ORAL_TABLET | Freq: Every day | ORAL | Status: DC

## 2015-09-27 ENCOUNTER — Ambulatory Visit: Payer: Commercial Managed Care - HMO | Admitting: Gastroenterology

## 2015-09-29 ENCOUNTER — Ambulatory Visit (INDEPENDENT_AMBULATORY_CARE_PROVIDER_SITE_OTHER): Payer: Commercial Managed Care - HMO | Admitting: Internal Medicine

## 2015-09-29 ENCOUNTER — Encounter: Payer: Self-pay | Admitting: Internal Medicine

## 2015-09-29 ENCOUNTER — Ambulatory Visit (HOSPITAL_COMMUNITY)
Admission: RE | Admit: 2015-09-29 | Discharge: 2015-09-29 | Disposition: A | Discharge status: Home / Self Care | Payer: Commercial Managed Care - HMO | Source: Ambulatory Visit | Attending: Internal Medicine | Admitting: Internal Medicine

## 2015-09-29 VITALS — BP 141/82 | HR 116 | Temp 98.4°F | Wt 173.3 lb

## 2015-09-29 DIAGNOSIS — Z7984 Long term (current) use of oral hypoglycemic drugs: Secondary | ICD-10-CM

## 2015-09-29 DIAGNOSIS — R000 Tachycardia, unspecified: Secondary | ICD-10-CM | POA: Insufficient documentation

## 2015-09-29 DIAGNOSIS — I10 Essential (primary) hypertension: Secondary | ICD-10-CM

## 2015-09-29 DIAGNOSIS — Z794 Long term (current) use of insulin: Secondary | ICD-10-CM

## 2015-09-29 DIAGNOSIS — E1165 Type 2 diabetes mellitus with hyperglycemia: Secondary | ICD-10-CM

## 2015-09-29 DIAGNOSIS — Z79899 Other long term (current) drug therapy: Secondary | ICD-10-CM

## 2015-09-29 MED ORDER — METOPROLOL SUCCINATE ER 25 MG PO TB24: 12.5000 mg | ORAL_TABLET | Freq: Every day | ORAL | Status: DC

## 2015-09-29 NOTE — Assessment & Plan Note (Signed)
Pt normally has elevated HRs in the 80-100's. She is asymptomatic denying chest pain, diaphoreses, or shortness of breath. She can feel an occasional heart fluttering. On PE she is in sinus tachycardia. TSH was nl in 2013. No signs or sx of infection and patient states she stays well hydrated.   - ordered EKG today which confirms sinus tachycardia w/ HR 127.  - started on metoprolol 12.5mg  daily.  - f/u in 1 month

## 2015-09-29 NOTE — Patient Instructions (Signed)
Start taking metoprolol 12.5 mg once a day for heart rate control.   If you notice dizziness, sweating, or light headedness stop taking your BP meds and call the clinic.   Continue working on exercise and weight loss for better diabetes control.

## 2015-09-29 NOTE — Assessment & Plan Note (Addendum)
BP Readings from Last 3 Encounters:  09/29/15 141/82  08/13/15 102/68  07/28/15 126/68    Lab Results  Component Value Date   NA 139 08/13/2015   K 4.3 08/13/2015   CREATININE 0.66 08/13/2015    Assessment: Blood pressure control:  mildly elevated Progress toward BP goal:   near goal Comments: pt is requesting to be taking off of lisinopril b/c she feels it is making her have chest palpitations. Denies cough, SOB, or chest pain.   Plan: Medications:  Continue lisinopril and added metoprolol 12.5mg  for HR control.  Educational resources provided:  Counseled on sx of low BP and to stop taking BP meds if her BP is too low.  Self management tools provided:   Other plans: f/u in 1 month

## 2015-09-29 NOTE — Progress Notes (Signed)
Subjective:   Patient ID: Michele Andrews female   DOB: 04/05/1951 64 y.o.   MRN: [id number]  HPI: [name] is a 64 y.o. w/PMHx outlined below who is here for DM follow up.   Please see problem list for status of the pt's chronic medical problems.  Past Medical History  Diagnosis Date  . Hypertension   . Diabetes mellitus   . Hypercalcemia   . Hyperlipidemia   . Carpal tunnel syndrome   . Rotator cuff syndrome of right shoulder   . Osteopenia    Current Outpatient Prescriptions  Medication Sig Dispense Refill  . Blood Glucose Monitoring Suppl (TRUETRACK BLOOD GLUCOSE) W/DEVICE KIT Use to check blood sugar 2 times a day     . furosemide (LASIX) 40 MG tablet Take 1 tablet (40 mg total) by mouth daily. 90 tablet 3  . glucose blood (FREESTYLE TEST STRIPS) test strip Use to check blood sugar 3 to 4 times daily. diag code 250.00. Insulin dependent 100 each 6  . Insulin Detemir (LEVEMIR) 100 UNIT/ML Pen Inject 58 Units into the skin at bedtime. 15 mL 11  . Insulin Pen Needle 31G X 5 MM MISC 1 each by Does not apply route 3 (three) times daily before meals. 100 each 5  . Lancets (ACCU-CHEK SOFT TOUCH) lancets Use to check blood sugar 2 times a day 100 each 5  . lisinopril (PRINIVIL,ZESTRIL) 10 MG tablet Take 1 tablet (10 mg total) by mouth daily. 90 tablet 4  . metFORMIN (GLUCOPHAGE) 1000 MG tablet Take 1 tablet (1,000 mg total) by mouth 2 (two) times daily with a meal. 180 tablet 4  . metoprolol succinate (TOPROL XL) 25 MG 24 hr tablet Take 0.5 tablets (12.5 mg total) by mouth daily. 30 tablet 1  . pravastatin (PRAVACHOL) 40 MG tablet Take 1 tablet (40 mg total) by mouth every evening. 30 tablet 5   No current facility-administered medications for this visit.   Family History  Problem Relation Age of Onset  . Hypertension Mother   . Cancer Mother    Social History   Social History  . Marital Status: Married    Spouse Name: N/A  . Number of Children: N/A  . Years of  Education: 12   Occupational History  . nurse tech [employer]   Social History Main Topics  . Smoking status: Never Smoker   . Smokeless tobacco: None  . Alcohol Use: No  . Drug Use: No  . Sexual Activity: Not Asked   Other Topics Concern  . None   Social History Narrative   Review of Systems: Review of Systems  Constitutional: Negative for fever and malaise/fatigue.  Respiratory: Negative for cough and shortness of breath.   Cardiovascular: Positive for palpitations (feel like her chest is flutter x 1 month). Negative for chest pain and leg swelling.  Gastrointestinal: Negative for nausea and vomiting.  Genitourinary: Negative for frequency.  Endo/Heme/Allergies: Negative for polydipsia.    Objective:  Physical Exam: Filed Vitals:   09/29/15 1336  BP: 141/82  Pulse: 116  Temp: 98.4 F (36.9 C)  TempSrc: Oral  Weight: 173 lb 4.8 oz (78.608 kg)  SpO2: 100%   Physical Exam  Constitutional: She appears well-developed and well-nourished. No distress.  HENT:  Head: Normocephalic.  Cardiovascular: Regular rhythm and normal heart sounds.  Tachycardia present.  Exam reveals no gallop and no friction rub.   No murmur heard. Pulmonary/Chest: Effort normal and breath sounds normal. No respiratory distress. She  has no wheezes. She has no rales.  Abdominal: Soft.  Neurological: She is alert.  Skin: Skin is warm and dry.   Assessment & Plan:   Please see problem based assessment and plan.

## 2015-09-29 NOTE — Assessment & Plan Note (Signed)
Lab Results  Component Value Date   HGBA1C 9.0 07/28/2015   HGBA1C 9.5 03/09/2015   HGBA1C 7.8 11/13/2014     Assessment: Diabetes control:  not controlled Progress toward A1C goal:   unable to assess Comments: pt is taking lantus 58 units qhs and metformin 1000mg  BID. She has been eating healthier and exercising more by walking her dog. She checks her glucose one daily in the morning and her glucometer report ranges from 89-162.   Plan: Medications:  continue current medications Home glucose monitoring: Frequency:   Timing:   Instruction/counseling given: reminded to bring blood glucose meter & log to each visit Educational resources provided:   Self management tools provided:   Other plans: f/u in 1 month for hgb a1c

## 2015-10-05 NOTE — Progress Notes (Signed)
Case discussed with Dr. Hulen Luster at time of visit.  We reviewed the resident's history and exam and pertinent patient test results.  I agree with the assessment, diagnosis, and plan of care documented in the resident's note.  The cause of the sinus tachycardia remains unclear at this time.  The indication for the furosemide also remains unclear.  It is possible she may be volume depleted from the furosemide.  We will treat symptomatically with the initiation of beta blockade and titrate up as tolerated.  We will also document the indication for the lasix at the follow-up visit.  If there is no good indication it will be stopped.  Finally, we will recheck the TSH at follow-up given it has been three years since it was last checked and we currently have no other explanation for the tachycardia.

## 2015-10-27 NOTE — Addendum Note (Signed)
Addended by: Hulan Fray on: 10/27/2015 06:03 PM   Modules accepted: Orders

## 2015-10-29 ENCOUNTER — Encounter (HOSPITAL_COMMUNITY)
Admission: RE | Admit: 2015-10-29 | Discharge: 2015-10-29 | Disposition: A | Discharge status: Home / Self Care | Payer: Commercial Managed Care - HMO | Source: Ambulatory Visit | Attending: Surgery | Admitting: Surgery

## 2015-10-29 ENCOUNTER — Encounter (HOSPITAL_COMMUNITY): Payer: Self-pay

## 2015-10-29 DIAGNOSIS — Z7984 Long term (current) use of oral hypoglycemic drugs: Secondary | ICD-10-CM | POA: Insufficient documentation

## 2015-10-29 DIAGNOSIS — E119 Type 2 diabetes mellitus without complications: Secondary | ICD-10-CM | POA: Insufficient documentation

## 2015-10-29 DIAGNOSIS — E210 Primary hyperparathyroidism: Secondary | ICD-10-CM | POA: Insufficient documentation

## 2015-10-29 DIAGNOSIS — R9431 Abnormal electrocardiogram [ECG] [EKG]: Secondary | ICD-10-CM | POA: Insufficient documentation

## 2015-10-29 DIAGNOSIS — I10 Essential (primary) hypertension: Secondary | ICD-10-CM | POA: Insufficient documentation

## 2015-10-29 DIAGNOSIS — Z79899 Other long term (current) drug therapy: Secondary | ICD-10-CM | POA: Insufficient documentation

## 2015-10-29 DIAGNOSIS — Z01818 Encounter for other preprocedural examination: Secondary | ICD-10-CM | POA: Diagnosis not present

## 2015-10-29 DIAGNOSIS — E785 Hyperlipidemia, unspecified: Secondary | ICD-10-CM | POA: Diagnosis not present

## 2015-10-29 DIAGNOSIS — Z01812 Encounter for preprocedural laboratory examination: Secondary | ICD-10-CM | POA: Insufficient documentation

## 2015-10-29 HISTORY — DX: Family history of other specified conditions: Z84.89

## 2015-10-29 LAB — CBC
HCT: 38.8 % (ref 36.0–46.0)
Hemoglobin: 12.6 g/dL (ref 12.0–15.0)
MCH: 30.5 pg (ref 26.0–34.0)
MCHC: 32.5 g/dL (ref 30.0–36.0)
MCV: 93.9 fL (ref 78.0–100.0)
PLATELETS: 209 10*3/uL (ref 150–400)
RBC: 4.13 MIL/uL (ref 3.87–5.11)
RDW: 13.5 % (ref 11.5–15.5)
WBC: 9.8 10*3/uL (ref 4.0–10.5)

## 2015-10-29 LAB — BASIC METABOLIC PANEL
Anion gap: 7 (ref 5–15)
BUN: 8 mg/dL (ref 6–20)
CALCIUM: 11.5 mg/dL — AB (ref 8.9–10.3)
CO2: 29 mmol/L (ref 22–32)
CREATININE: 0.65 mg/dL (ref 0.44–1.00)
Chloride: 101 mmol/L (ref 101–111)
GFR calc non Af Amer: >60 mL/min (ref >60)
Glucose, Bld: 190 mg/dL — ABNORMAL HIGH (ref 65–99)
Potassium: 4.4 mmol/L (ref 3.5–5.1)
Sodium: 137 mmol/L (ref 135–145)

## 2015-10-29 LAB — GLUCOSE, CAPILLARY: Glucose-Capillary: 189 mg/dL — ABNORMAL HIGH (ref 65–99)

## 2015-10-29 LAB — TSH: TSH: 0.994 u[IU]/mL (ref 0.350–4.500)

## 2015-10-29 NOTE — Progress Notes (Signed)
Call to A. Zelenak,PAC, review of last OV /w internal medicine. Pt. Tolerating Metoprolol, pulse has decreased & pt. Denies having any palpitations, as she was before starting Metoprolol. Orders furnished.

## 2015-10-29 NOTE — Pre-Procedure Instructions (Addendum)
Michele Andrews  10/29/2015      [facility] - [place], [place] - [address] [place] [address] Phone: [phone] Fax: [place], [place] [address] Phone: [phone] Fax: [phone]    Your procedure is scheduled on 11/08/2015.  Report to [facility] Admitting at 9:50 A.M.  Call this number if you have problems the morning of surgery:  [phone]   Remember:  Do not eat food or drink liquids after midnight. On Sunday   Take these medicines the morning of surgery with A SIP OF WATER : take Metoprolol   Do not wear jewelry, make-up or nail polish.   Do not wear lotions, powders, or perfumes.    Do not shave 48 hours prior to surgery.      Do not bring valuables to the hospital.   [facility] is not responsible for any belongings or valuables.  Contacts, dentures or bridgework may not be worn into surgery.  Leave your suitcase in the car.  After surgery it may be brought to your room.  For patients admitted to the hospital, discharge time will be determined by your treatment team.  Patients discharged the day of surgery will not be allowed to drive home.   Name and phone number of your driver:   With spouse  Special instructions:    Special Instructions: [place] - Preparing for Surgery  Before surgery, you can play an important role.  Because skin is not sterile, your skin needs to be as free of germs as possible.  You can reduce the number of germs on you skin by washing with CHG (chlorahexidine gluconate) soap before surgery.  CHG is an antiseptic cleaner which kills germs and bonds with the skin to continue killing germs even after washing.  Please DO NOT use if you have an allergy to CHG or antibacterial soaps.  If your skin becomes reddened/irritated stop using the CHG and inform your nurse  when you arrive at Short Stay.  Do not shave (including legs and underarms) for at least 48 hours prior to the first CHG shower.  You may shave your face.  Please follow these instructions carefully:   1.  Shower with CHG Soap the night before surgery and the  morning of Surgery.  2.  If you choose to wash your hair, wash your hair first as usual with your  normal shampoo.  3.  After you shampoo, rinse your hair and body thoroughly to remove the  Shampoo.  4.  Use CHG as you would any other liquid soap.  You can apply chg directly to the skin and wash gently with scrungie or a clean washcloth.  5.  Apply the CHG Soap to your body ONLY FROM THE NECK DOWN.    Do not use on open wounds or open sores.  Avoid contact with your eyes, ears, mouth and genitals (private parts).  Wash genitals (private parts)   with your normal soap.  6.  Wash thoroughly, paying special attention to the area where your surgery will be performed.  7.  Thoroughly rinse your body with warm water from the neck down.  8.  DO NOT shower/wash with your normal soap after using and rinsing off   the CHG Soap.  9.  Pat yourself dry with a clean towel.  10.  Wear clean pajamas.            11.  Place clean sheets on your bed the night of your first shower and do not sleep with pets.  Day of Surgery  Do not apply any lotions/deodorants the morning of surgery.  Please wear clean clothes to the hospital/surgery center.   How to Manage Your Diabetes Before Surgery   Why is it important to control my blood sugar before and after surgery?   Improving blood sugar levels before and after surgery helps healing and can limit problems.  A way of improving blood sugar control is eating a healthy diet by:  - Eating less sugar and carbohydrates  - Increasing activity/exercise  - Talk with your doctor about reaching your blood sugar goals  High blood sugars (greater than 180 mg/dL) can raise your risk of infections and slow  down your recovery so you will need to focus on controlling your diabetes during the weeks before surgery.  Make sure that the doctor who takes care of your diabetes knows about your planned surgery including the date and location.  How do I manage my blood sugars before surgery?   Check your blood sugar at least 4 times a day, 2 days before surgery to make sure that they are not too high or low.   Check your blood sugar the morning of your surgery when you wake up and every 2               hours until you get to the Short-Stay unit.  If your blood sugar is less than 70 mg/dL, you will need to treat for low blood sugar by:  Treat a low blood sugar (less than 70 mg/dL) with 1/2 cup of clear juice (cranberry or apple), 4 glucose tablets, OR glucose gel.  Recheck blood sugar in 15 minutes after treatment (to make sure it is greater than 70 mg/dL).  If blood sugar is not greater than 70 mg/dL on re-check, call [phone] for further instructions.   Report your blood sugar to the Short-Stay nurse when you get to Short-Stay.  References:  [facility], 2007 "How to Manage your Diabetes Before and After Surgery".  What do I do about my diabetes medications?   Do not take oral diabetes medicines (pills) the morning of surgery    THE NIGHT BEFORE SURGERY, take Levemir   Insulin 46  units of          Please read over the following fact sheets that you were given. Pain Booklet, Coughing and Deep Breathing and Surgical Site Infection Prevention

## 2015-10-29 NOTE — Pre-Procedure Instructions (Signed)
Michele Andrews  10/29/2015      [facility] - [place], [place] - [address] [place] [address] Phone: [phone] Fax: [place], [place] [address] Phone: [phone] Fax: [phone]    Your procedure is scheduled on 11/08/15.  Report to Michele Andrews ([place]) [facility] short stay admitting at 945 A.M.  Call this number if you have problems the morning of surgery:  [phone]   Remember:  Do not eat food or drink liquids after midnight.  Take these medicines the morning of surgery with A SIP OF WATER metoprolol   STOP all herbel meds, nsaids (aleve,naproxen,advil,ibuprofen) 5 days prior to surgery starting 10/24/15 including vitamins(vit D), aspirin  How to Manage Your Diabetes Before Surgery   Why is it important to control my blood sugar before and after surgery?   Improving blood sugar levels before and after surgery helps healing and can limit problems.  A way of improving blood sugar control is eating a healthy diet by:  - Eating less sugar and carbohydrates  - Increasing activity/exercise  - Talk with your doctor about reaching your blood sugar goals  High blood sugars (greater than 180 mg/dL) can raise your risk of infections and slow down your recovery so you will need to focus on controlling your diabetes during the weeks before surgery.  Make sure that the doctor who takes care of your diabetes knows about your planned surgery including the date and location.  How do I manage my blood sugars before surgery?   Check your blood sugar at least 4 times a day, 2 days before surgery to make sure that they are not too high or low.   Check your blood sugar the morning of your surgery when you wake up and every 2               hours until you get to the Short-Stay unit.  If your blood sugar is less than 70 mg/dL, you will need  to treat for low blood sugar by:  Treat a low blood sugar (less than 70 mg/dL) with 1/2 cup of clear juice (cranberry or apple), 4 glucose tablets, OR glucose gel.  Recheck blood sugar in 15 minutes after treatment (to make sure it is greater than 70 mg/dL).  If blood sugar is not greater than 70 mg/dL on re-check, call [phone] for further instructions.   Report your blood sugar to the Short-Stay nurse when you get to Short-Stay.  References:  [facility], 2007 "How to Manage your Diabetes Before and After Surgery".  What do I do about my diabetes medications?   Do not take oral diabetes medicines (pills) the morning of surgery.(no metformin)    THE NIGHT BEFORE SURGERY, take 46 units of levimir Insulin.    Do not wear jewelry, make-up or nail polish.  Do not wear lotions, powders, or perfumes.  You may wear deodorant.  Do not shave 48 hours prior to surgery.  Men may shave face and neck.  Do not bring valuables to the hospital.  [facility] is not responsible for any belongings or valuables.  Contacts, dentures or bridgework may not be worn into surgery.  Leave your suitcase in the car.  After surgery it may be brought to your room.  For patients admitted to the hospital, discharge time will be determined by  your treatment team.  Patients discharged the day of surgery will not be allowed to drive home.   Name and phone number of your driver:    Special instructions:  Special Instructions: [facility] Andrews - Preparing for Surgery  Before surgery, you can play an important role.  Because skin is not sterile, your skin needs to be as free of germs as possible.  You can reduce the number of germs on you skin by washing with CHG (chlorahexidine gluconate) soap before surgery.  CHG is an antiseptic cleaner which kills germs and bonds with the skin to continue killing germs even after washing.  Please DO NOT use if you have an allergy to CHG or  antibacterial soaps.  If your skin becomes reddened/irritated stop using the CHG and inform your nurse when you arrive at Short Stay.  Do not shave (including legs and underarms) for at least 48 hours prior to the first CHG shower.  You may shave your face.  Please follow these instructions carefully:   1.  Shower with CHG Soap the night before surgery and the morning of Surgery.  2.  If you choose to wash your hair, wash your hair first as usual with your normal shampoo.  3.  After you shampoo, rinse your hair and body thoroughly to remove the Shampoo.  4.  Use CHG as you would any other liquid soap.  You can apply chg directly  to the skin and wash gently with scrungie or a clean washcloth.  5.  Apply the CHG Soap to your body ONLY FROM THE NECK DOWN.  Do not use on open wounds or open sores.  Avoid contact with your eyes ears, mouth and genitals (private parts).  Wash genitals (private parts)       with your normal soap.  6.  Wash thoroughly, paying special attention to the area where your surgery will be performed.  7.  Thoroughly rinse your body with warm water from the neck down.  8.  DO NOT shower/wash with your normal soap after using and rinsing off the CHG Soap.  9.  Pat yourself dry with a clean towel.            10.  Wear clean pajamas.            11.  Place clean sheets on your bed the night of your first shower and do not sleep with pets.  Day of Surgery  Do not apply any lotions/deodorants the morning of surgery.  Please wear clean clothes to the hospital/surgery [facility].  Please read over the following fact sheets that you were given. Pain Booklet, Coughing and Deep Breathing and Surgical Site Infection Prevention

## 2015-10-30 LAB — HEMOGLOBIN A1C
HEMOGLOBIN A1C: 9.1 % — AB (ref 4.8–5.6)
Mean Plasma Glucose: 214 mg/dL

## 2015-11-01 NOTE — Progress Notes (Signed)
Anesthesia Chart Review:  Pt is 64 year old female scheduled for parathyroidectomy on 11/08/2015 with Dr. Harlow Asa.   PMH includes:  HTN, hyperlipidemia, hypercalcemia, DM. Never smoker. BMI 31.   Medications include: lasix, levemir, lisinopril, metformin, metoprolol, pravastatin.   Preoperative labs reviewed.  Glucose 190, hgbA1c 9.1. TSH WNL.   EKG 10/29/15: NSR. LVH.   Pt receives primary care at the [facility]. She was seen 09/29/15 and found to be tachycardic. Pt was started on metoprolol, and her HR was normal at PAT. TSH results also forwarded to PCP.   Pt has requested Dr. Linna Caprice as her MDA.   If no changes, I anticipate pt can proceed with surgery as scheduled.   Willeen Cass, FNP-BC [facility] Short Stay Surgical Center/Anesthesiology Phone: [phone] 11/01/2015 4:43 PM

## 2015-11-07 ENCOUNTER — Encounter (HOSPITAL_COMMUNITY): Payer: Self-pay | Admitting: Surgery

## 2015-11-07 MED ORDER — CEFAZOLIN SODIUM-DEXTROSE 2-3 GM-% IV SOLR
2.0000 g | INTRAVENOUS | Status: AC
  Administered 2015-11-08: 2 g via INTRAVENOUS
  Filled 2015-11-07: qty 50

## 2015-11-07 NOTE — H&P (Signed)
General Surgery [facility]  Rich Number DOB: Apr 06, 1951 Married / Language: English / Race: Black or African American Female  History of Present Illness The patient is a 64 year old female who presents with a parathyroid neoplasm. Patient is referred by Dr. Philemon Kingdom for evaluation of suspected primary hyperparathyroidism. Patient has had hypercalcemia dating back to 2013. Recent laboratory studies show an elevated calcium level of 12.1. Intact PTH levels have ranged from 35-81. A 24-hour urine collection for calcium performed 2 years ago was reportedly elevated. Patient has had low vitamin D levels but is currently on treatment with normalization. Bone density scan shows mild to moderate osteopenia. Patient denies any fractures. She denies any history of nephrolithiasis. Patient has had no prior head or neck surgery. There is a family history of hypothyroidism and the patient's mother. There is no family history of other endocrine neoplasms. Patient denies fatigue. She denies tremors or palpitations.  Other Problems Diabetes Mellitus Hypercholesterolemia Lump In Breast  Past Surgical History Hysterectomy (not due to cancer) - Complete  Diagnostic Studies History Colonoscopy 5-10 years ago Mammogram within last year Pap Smear >5 years ago  Allergies No Known Drug Allergies09/20/2016  Medication History Furosemide (40MG  Tablet, Oral) Active. MetFORMIN HCl (1000MG  Tablet, Oral) Active. Blood Glucose Monitoring Suppl Active. Levemir FlexTouch (100UNIT/ML Soln Pen-inj, Subcutaneous) Active. Lancets Standard Active. Lisinopril (10MG  Tablet, Oral) Active. Pravastatin Sodium (40MG  Tablet, Oral) Active. Medications Reconciled  Social History Caffeine use Coffee, Tea. No alcohol use No drug use Tobacco use Never smoker.  Family History Alcohol Abuse Father. Arthritis Mother. Breast Cancer Mother. Colon Cancer  Family Members In General. Depression Daughter. Hypertension Mother. Thyroid problems Mother.  Pregnancy / Birth History  Age at menarche 11 years. Gravida 4 Irregular periods Maternal age 27-20 Para 3  Review of Systems  General Not Present- Appetite Loss, Chills, Fatigue, Fever, Night Sweats, Weight Gain and Weight Loss. Skin Not Present- Change in Wart/Mole, Dryness, Hives, Jaundice, New Lesions, Non-Healing Wounds, Rash and Ulcer. HEENT Not Present- Earache, Hearing Loss, Hoarseness, Nose Bleed, Oral Ulcers, Ringing in the Ears, Seasonal Allergies, Sinus Pain, Sore Throat, Visual Disturbances, Wears glasses/contact lenses and Yellow Eyes. Respiratory Not Present- Bloody sputum, Chronic Cough, Difficulty Breathing, Snoring and Wheezing. Breast Not Present- Breast Mass, Breast Pain, Nipple Discharge and Skin Changes. Cardiovascular Not Present- Chest Pain, Difficulty Breathing Lying Down, Leg Cramps, Palpitations, Rapid Heart Rate, Shortness of Breath and Swelling of Extremities. Gastrointestinal Present- Excessive gas. Not Present- Abdominal Pain, Bloating, Bloody Stool, Change in Bowel Habits, Chronic diarrhea, Constipation, Difficulty Swallowing, Gets full quickly at meals, Hemorrhoids, Indigestion, Nausea, Rectal Pain and Vomiting. Female Genitourinary Not Present- Frequency, Nocturia, Painful Urination, Pelvic Pain and Urgency. Musculoskeletal Not Present- Back Pain, Joint Pain, Joint Stiffness, Muscle Pain, Muscle Weakness and Swelling of Extremities. Neurological Not Present- Decreased Memory, Fainting, Headaches, Numbness, Seizures, Tingling, Tremor, Trouble walking and Weakness. Psychiatric Not Present- Anxiety, Bipolar, Change in Sleep Pattern, Depression, Fearful and Frequent crying. Endocrine Not Present- Cold Intolerance, Excessive Hunger, Hair Changes, Heat Intolerance, Hot flashes and New Diabetes. Hematology Not Present- Easy Bruising, Excessive bleeding, Gland  problems, HIV and Persistent Infections.  Vitals Weight: 174 lb Height: 63.5in Body Surface Area: 1.88 m Body Mass Index: 30.34 kg/m  Temp.: 98.82F(Temporal)  Pulse: 99 (Regular)  BP: 128/78 (Sitting, Left Arm, Standard)  Physical Exam   General - appears comfortable, no distress; not diaphorectic  HEENT - normocephalic; sclerae clear, gaze conjugate; mucous membranes moist, dentition good; voice normal  Neck - symmetric on extension; no palpable anterior or posterior cervical adenopathy; no palpable masses in the thyroid bed  Chest - clear bilaterally without rhonchi, rales, or wheeze  Cor - regular rhythm with normal rate; no significant murmur  Ext - non-tender without significant edema or lymphedema  Neuro - grossly intact; no tremor   Assessment & Plan  PRIMARY HYPERPARATHYROIDISM (E21.0)  Patient presents with signs and symptoms of primary hyperparathyroidism. I provided her with written literature on parathyroid surgery to review at home.  Patient has a progressive rise in serum calcium levels which now exceed 12. Intact PTH level is not sufficiently suppressed. Patient has not had any imaging performed. Therefore we will proceed with nuclear medicine parathyroid scan in the near future. Patient denied discuss the study. I will contact her with the results. If the sestamibi scan localizes a parathyroid adenoma, I believe the patient will be a good candidate for minimally invasive surgery. If the scan is negative, then we will obtain a ultrasound examination of the neck in hopes of identifying a parathyroid adenoma.  Nuclear scan localized an adenoma to the right inferior position.  Plan minimally invasive parathyroidectomy.  The risks and benefits of the procedure have been discussed at length with the patient.  The patient understands the proposed procedure, potential alternative treatments, and the course of recovery to be expected.  All of the patient's  questions have been answered at this time.  The patient wishes to proceed with surgery.  Earnstine Regal, MD, [facility] Office: [phone]

## 2015-11-07 NOTE — Anesthesia Preprocedure Evaluation (Addendum)
Anesthesia Evaluation  Patient identified by MRN, date of birth, ID band Patient awake    Reviewed: Allergy & Precautions, NPO status , Patient's Chart, lab work & pertinent test results  Airway Mallampati: II  TM Distance: >3 FB Neck ROM: Full    Dental  (+) Teeth Intact, Dental Advisory Given   Pulmonary    breath sounds clear to auscultation       Cardiovascular hypertension,  Rhythm:Regular Rate:Normal     Neuro/Psych    GI/Hepatic   Endo/Other  diabetes  Renal/GU      Musculoskeletal   Abdominal   Peds  Hematology   Anesthesia Other Findings   Reproductive/Obstetrics                            Anesthesia Physical Anesthesia Plan  ASA: III  Anesthesia Plan: General   Post-op Pain Management:    Induction: Intravenous  Airway Management Planned: Oral ETT  Additional Equipment:   Intra-op Plan:   Post-operative Plan: Extubation in OR  Informed Consent: I have reviewed the patients History and Physical, chart, labs and discussed the procedure including the risks, benefits and alternatives for the proposed anesthesia with the patient or authorized representative who has indicated his/her understanding and acceptance.   Dental advisory given  Plan Discussed with: CRNA and Anesthesiologist  Anesthesia Plan Comments: (Hyperparathyroidism Calcium 11.5  Hypertension Type 2 DM glucose- 161  Plan GA with oral ETT  Roberts Gaudy)        Anesthesia Quick Evaluation

## 2015-11-08 ENCOUNTER — Observation Stay (HOSPITAL_COMMUNITY)
Admission: RE | Admit: 2015-11-08 | Discharge: 2015-11-09 | Disposition: A | Discharge status: Home / Self Care | Payer: Commercial Managed Care - HMO | Source: Ambulatory Visit | Attending: Surgery | Admitting: Surgery

## 2015-11-08 ENCOUNTER — Ambulatory Visit (HOSPITAL_COMMUNITY): Payer: Commercial Managed Care - HMO | Admitting: Anesthesiology

## 2015-11-08 ENCOUNTER — Encounter (HOSPITAL_COMMUNITY)
Admission: RE | Disposition: A | Discharge status: Home / Self Care | Payer: Self-pay | Source: Ambulatory Visit | Attending: Surgery

## 2015-11-08 ENCOUNTER — Ambulatory Visit (HOSPITAL_COMMUNITY): Payer: Commercial Managed Care - HMO | Admitting: Vascular Surgery

## 2015-11-08 ENCOUNTER — Encounter (HOSPITAL_COMMUNITY): Payer: Self-pay | Admitting: *Deleted

## 2015-11-08 DIAGNOSIS — Z794 Long term (current) use of insulin: Secondary | ICD-10-CM | POA: Insufficient documentation

## 2015-11-08 DIAGNOSIS — Z79899 Other long term (current) drug therapy: Secondary | ICD-10-CM | POA: Insufficient documentation

## 2015-11-08 DIAGNOSIS — N63 Unspecified lump in breast: Secondary | ICD-10-CM | POA: Diagnosis not present

## 2015-11-08 DIAGNOSIS — E7800 Pure hypercholesterolemia, unspecified: Secondary | ICD-10-CM | POA: Diagnosis not present

## 2015-11-08 DIAGNOSIS — E213 Hyperparathyroidism, unspecified: Secondary | ICD-10-CM

## 2015-11-08 DIAGNOSIS — E210 Primary hyperparathyroidism: Secondary | ICD-10-CM | POA: Diagnosis not present

## 2015-11-08 DIAGNOSIS — E119 Type 2 diabetes mellitus without complications: Secondary | ICD-10-CM | POA: Insufficient documentation

## 2015-11-08 DIAGNOSIS — D351 Benign neoplasm of parathyroid gland: Secondary | ICD-10-CM | POA: Insufficient documentation

## 2015-11-08 DIAGNOSIS — I10 Essential (primary) hypertension: Secondary | ICD-10-CM | POA: Diagnosis not present

## 2015-11-08 HISTORY — PX: PARATHYROIDECTOMY: SHX19

## 2015-11-08 LAB — GLUCOSE, CAPILLARY
GLUCOSE-CAPILLARY: 161 mg/dL — AB (ref 65–99)
GLUCOSE-CAPILLARY: 241 mg/dL — AB (ref 65–99)
Glucose-Capillary: 133 mg/dL — ABNORMAL HIGH (ref 65–99)
Glucose-Capillary: 253 mg/dL — ABNORMAL HIGH (ref 65–99)

## 2015-11-08 SURGERY — PARATHYROIDECTOMY
Anesthesia: General | Site: Neck

## 2015-11-08 MED ORDER — BUPIVACAINE HCL (PF) 0.25 % IJ SOLN
INTRAMUSCULAR | Status: DC | PRN
  Administered 2015-11-08: 10 mL

## 2015-11-08 MED ORDER — 0.9 % SODIUM CHLORIDE (POUR BTL) OPTIME
TOPICAL | Status: DC | PRN
  Administered 2015-11-08: 1000 mL

## 2015-11-08 MED ORDER — FENTANYL CITRATE (PF) 100 MCG/2ML IJ SOLN
INTRAMUSCULAR | Status: AC
  Administered 2015-11-08: 25 ug via INTRAVENOUS
  Filled 2015-11-08: qty 2

## 2015-11-08 MED ORDER — ONDANSETRON HCL 4 MG/2ML IJ SOLN
INTRAMUSCULAR | Status: DC | PRN
  Administered 2015-11-08: 4 mg via INTRAVENOUS

## 2015-11-08 MED ORDER — PROPOFOL 10 MG/ML IV BOLUS
INTRAVENOUS | Status: AC
  Filled 2015-11-08: qty 20

## 2015-11-08 MED ORDER — ACETAMINOPHEN 650 MG RE SUPP
650.0000 mg | Freq: Four times a day (QID) | RECTAL | Status: DC | PRN
Start: 2015-11-08 — End: 2015-11-09

## 2015-11-08 MED ORDER — INSULIN DETEMIR 100 UNIT/ML ~~LOC~~ SOLN
58.0000 [IU] | Freq: Every day | SUBCUTANEOUS | Status: DC
  Administered 2015-11-08: 58 [IU] via SUBCUTANEOUS
  Filled 2015-11-08 (×2): qty 0.58

## 2015-11-08 MED ORDER — BUPIVACAINE HCL (PF) 0.25 % IJ SOLN
INTRAMUSCULAR | Status: AC
  Filled 2015-11-08: qty 30

## 2015-11-08 MED ORDER — METFORMIN HCL 500 MG PO TABS
1000.0000 mg | ORAL_TABLET | Freq: Two times a day (BID) | ORAL | Status: DC
  Administered 2015-11-08 – 2015-11-09 (×2): 1000 mg via ORAL
  Filled 2015-11-08 (×2): qty 2

## 2015-11-08 MED ORDER — DEXAMETHASONE SODIUM PHOSPHATE 4 MG/ML IJ SOLN
INTRAMUSCULAR | Status: AC
  Filled 2015-11-08: qty 2

## 2015-11-08 MED ORDER — SUCCINYLCHOLINE CHLORIDE 20 MG/ML IJ SOLN
INTRAMUSCULAR | Status: DC | PRN
  Administered 2015-11-08: 100 mg via INTRAVENOUS

## 2015-11-08 MED ORDER — FENTANYL CITRATE (PF) 100 MCG/2ML IJ SOLN
25.0000 ug | INTRAMUSCULAR | Status: DC | PRN
  Administered 2015-11-08 (×2): 25 ug via INTRAVENOUS

## 2015-11-08 MED ORDER — ONDANSETRON HCL 4 MG/2ML IJ SOLN
INTRAMUSCULAR | Status: AC
  Filled 2015-11-08: qty 2

## 2015-11-08 MED ORDER — METOPROLOL SUCCINATE ER 25 MG PO TB24
12.5000 mg | ORAL_TABLET | Freq: Every day | ORAL | Status: DC
  Administered 2015-11-09: 12.5 mg via ORAL
  Filled 2015-11-08: qty 1

## 2015-11-08 MED ORDER — LISINOPRIL 10 MG PO TABS
10.0000 mg | ORAL_TABLET | Freq: Every day | ORAL | Status: DC
  Administered 2015-11-08: 10 mg via ORAL
  Filled 2015-11-08: qty 1

## 2015-11-08 MED ORDER — LIDOCAINE HCL (CARDIAC) 20 MG/ML IV SOLN
INTRAVENOUS | Status: AC
  Filled 2015-11-08: qty 5

## 2015-11-08 MED ORDER — ONDANSETRON HCL 4 MG/2ML IJ SOLN: 4.0000 mg | Freq: Four times a day (QID) | INTRAMUSCULAR | Status: DC | PRN

## 2015-11-08 MED ORDER — ACETAMINOPHEN 325 MG PO TABS: 650.0000 mg | ORAL_TABLET | Freq: Four times a day (QID) | ORAL | Status: DC | PRN

## 2015-11-08 MED ORDER — FUROSEMIDE 40 MG PO TABS
40.0000 mg | ORAL_TABLET | Freq: Every day | ORAL | Status: DC
  Administered 2015-11-08: 40 mg via ORAL
  Filled 2015-11-08: qty 1

## 2015-11-08 MED ORDER — MIDAZOLAM HCL 5 MG/5ML IJ SOLN
INTRAMUSCULAR | Status: DC | PRN
  Administered 2015-11-08: 2 mg via INTRAVENOUS

## 2015-11-08 MED ORDER — INSULIN ASPART 100 UNIT/ML ~~LOC~~ SOLN
0.0000 [IU] | Freq: Three times a day (TID) | SUBCUTANEOUS | Status: DC
  Administered 2015-11-08: 8 [IU] via SUBCUTANEOUS
  Administered 2015-11-09: 3 [IU] via SUBCUTANEOUS

## 2015-11-08 MED ORDER — PHENYLEPHRINE HCL 10 MG/ML IJ SOLN
INTRAMUSCULAR | Status: DC | PRN
  Administered 2015-11-08 (×2): 80 ug via INTRAVENOUS
  Administered 2015-11-08: 40 ug via INTRAVENOUS
  Administered 2015-11-08: 80 ug via INTRAVENOUS
  Administered 2015-11-08: 40 ug via INTRAVENOUS

## 2015-11-08 MED ORDER — ONDANSETRON HCL 4 MG/2ML IJ SOLN: 4.0000 mg | Freq: Once | INTRAMUSCULAR | Status: DC | PRN

## 2015-11-08 MED ORDER — FENTANYL CITRATE (PF) 250 MCG/5ML IJ SOLN
INTRAMUSCULAR | Status: DC | PRN
  Administered 2015-11-08: 100 ug via INTRAVENOUS
  Administered 2015-11-08 (×2): 50 ug via INTRAVENOUS

## 2015-11-08 MED ORDER — PROPOFOL 10 MG/ML IV BOLUS
INTRAVENOUS | Status: DC | PRN
  Administered 2015-11-08: 130 mg via INTRAVENOUS

## 2015-11-08 MED ORDER — HEMOSTATIC AGENTS (NO CHARGE) OPTIME
TOPICAL | Status: DC | PRN
  Administered 2015-11-08: 1 via TOPICAL

## 2015-11-08 MED ORDER — ONDANSETRON 4 MG PO TBDP: 4.0000 mg | ORAL_TABLET | Freq: Four times a day (QID) | ORAL | Status: DC | PRN

## 2015-11-08 MED ORDER — OXYCODONE HCL 5 MG PO TABS
5.0000 mg | ORAL_TABLET | Freq: Once | ORAL | Status: AC | PRN
  Administered 2015-11-08: 5 mg via ORAL

## 2015-11-08 MED ORDER — LIDOCAINE HCL (CARDIAC) 20 MG/ML IV SOLN
INTRAVENOUS | Status: DC | PRN
  Administered 2015-11-08: 50 mg via INTRATRACHEAL
  Administered 2015-11-08: 40 mg via INTRAVENOUS

## 2015-11-08 MED ORDER — HYDROCODONE-ACETAMINOPHEN 5-325 MG PO TABS
1.0000 | ORAL_TABLET | ORAL | Status: DC | PRN
  Administered 2015-11-08 – 2015-11-09 (×3): 1 via ORAL
  Filled 2015-11-08 (×2): qty 1
  Filled 2015-11-08: qty 2

## 2015-11-08 MED ORDER — KCL IN DEXTROSE-NACL 20-5-0.45 MEQ/L-%-% IV SOLN
INTRAVENOUS | Status: DC
  Administered 2015-11-08: 15:00:00 via INTRAVENOUS
  Filled 2015-11-08: qty 1000

## 2015-11-08 MED ORDER — HYDROMORPHONE HCL 1 MG/ML IJ SOLN
1.0000 mg | INTRAMUSCULAR | Status: DC | PRN
  Administered 2015-11-08: 1 mg via INTRAVENOUS
  Filled 2015-11-08: qty 1

## 2015-11-08 MED ORDER — OXYCODONE HCL 5 MG/5ML PO SOLN: 5.0000 mg | Freq: Once | ORAL | Status: AC | PRN

## 2015-11-08 MED ORDER — MIDAZOLAM HCL 2 MG/2ML IJ SOLN
INTRAMUSCULAR | Status: AC
  Filled 2015-11-08: qty 2

## 2015-11-08 MED ORDER — PHENYLEPHRINE HCL 10 MG/ML IJ SOLN
10.0000 mg | INTRAVENOUS | Status: DC | PRN
  Administered 2015-11-08: 25 ug/min via INTRAVENOUS

## 2015-11-08 MED ORDER — OXYCODONE HCL 5 MG PO TABS
ORAL_TABLET | ORAL | Status: AC
  Filled 2015-11-08: qty 1

## 2015-11-08 MED ORDER — LACTATED RINGERS IV SOLN
INTRAVENOUS | Status: DC
  Administered 2015-11-08 (×2): via INTRAVENOUS

## 2015-11-08 MED ORDER — FENTANYL CITRATE (PF) 250 MCG/5ML IJ SOLN
INTRAMUSCULAR | Status: AC
  Filled 2015-11-08: qty 5

## 2015-11-08 SURGICAL SUPPLY — 50 items
APL SKNCLS STERI-STRIP NONHPOA (GAUZE/BANDAGES/DRESSINGS) ×1
ATTRACTOMAT 16X20 MAGNETIC DRP (DRAPES) ×2 IMPLANT
BENZOIN TINCTURE PRP APPL 2/3 (GAUZE/BANDAGES/DRESSINGS) ×2 IMPLANT
BLADE SURG 10 STRL SS (BLADE) IMPLANT
BLADE SURG 15 STRL LF DISP TIS (BLADE) ×1 IMPLANT
BLADE SURG 15 STRL SS (BLADE) ×2
CANISTER SUCTION 2500CC (MISCELLANEOUS) ×2 IMPLANT
CHLORAPREP W/TINT 26ML (MISCELLANEOUS) ×2 IMPLANT
CLIP TI MEDIUM 6 (CLIP) ×2 IMPLANT
CLIP TI WIDE RED SMALL 6 (CLIP) ×4 IMPLANT
CONT SPEC 4OZ CLIKSEAL STRL BL (MISCELLANEOUS) ×2 IMPLANT
COVER SURGICAL LIGHT HANDLE (MISCELLANEOUS) ×2 IMPLANT
CRADLE DONUT ADULT HEAD (MISCELLANEOUS) ×2 IMPLANT
DRAPE PED LAPAROTOMY (DRAPES) ×2 IMPLANT
DRAPE UTILITY XL STRL (DRAPES) ×2 IMPLANT
ELECT CAUTERY BLADE 6.4 (BLADE) ×2 IMPLANT
ELECT REM PT RETURN 9FT ADLT (ELECTROSURGICAL) ×2
ELECTRODE REM PT RTRN 9FT ADLT (ELECTROSURGICAL) ×1 IMPLANT
GAUZE SPONGE 2X2 8PLY STRL LF (GAUZE/BANDAGES/DRESSINGS) ×1 IMPLANT
GAUZE SPONGE 4X4 16PLY XRAY LF (GAUZE/BANDAGES/DRESSINGS) ×2 IMPLANT
GLOVE BIO SURGEON STRL SZ7.5 (GLOVE) ×2 IMPLANT
GLOVE BIOGEL PI IND STRL 7.5 (GLOVE) ×1 IMPLANT
GLOVE BIOGEL PI INDICATOR 7.5 (GLOVE) ×1
GLOVE SURG ORTHO 8.0 STRL STRW (GLOVE) ×2 IMPLANT
GOWN STRL REUS W/ TWL LRG LVL3 (GOWN DISPOSABLE) ×1 IMPLANT
GOWN STRL REUS W/ TWL XL LVL3 (GOWN DISPOSABLE) ×1 IMPLANT
GOWN STRL REUS W/TWL LRG LVL3 (GOWN DISPOSABLE) ×2
GOWN STRL REUS W/TWL XL LVL3 (GOWN DISPOSABLE) ×2
HEMOSTAT SURGICEL 2X4 FIBR (HEMOSTASIS) ×2 IMPLANT
KIT BASIN OR (CUSTOM PROCEDURE TRAY) ×2 IMPLANT
KIT ROOM TURNOVER OR (KITS) ×2 IMPLANT
NEEDLE HYPO 25GX1X1/2 BEV (NEEDLE) ×2 IMPLANT
NS IRRIG 1000ML POUR BTL (IV SOLUTION) ×2 IMPLANT
PACK SURGICAL SETUP 50X90 (CUSTOM PROCEDURE TRAY) ×2 IMPLANT
PAD ARMBOARD 7.5X6 YLW CONV (MISCELLANEOUS) ×2 IMPLANT
PENCIL BUTTON HOLSTER BLD 10FT (ELECTRODE) ×2 IMPLANT
SPONGE GAUZE 2X2 STER 10/PKG (GAUZE/BANDAGES/DRESSINGS) ×1
SPONGE INTESTINAL PEANUT (DISPOSABLE) ×2 IMPLANT
STRIP CLOSURE SKIN 1/2X4 (GAUZE/BANDAGES/DRESSINGS) ×2 IMPLANT
SUT MNCRL AB 4-0 PS2 18 (SUTURE) ×2 IMPLANT
SUT SILK 2 0 (SUTURE) ×2
SUT SILK 2-0 18XBRD TIE 12 (SUTURE) ×1 IMPLANT
SUT SILK 3 0 (SUTURE)
SUT SILK 3-0 18XBRD TIE 12 (SUTURE) IMPLANT
SUT VIC AB 3-0 SH 18 (SUTURE) ×2 IMPLANT
SYR BULB 3OZ (MISCELLANEOUS) ×2 IMPLANT
SYR CONTROL 10ML LL (SYRINGE) ×2 IMPLANT
TOWEL OR 17X24 6PK STRL BLUE (TOWEL DISPOSABLE) ×2 IMPLANT
TOWEL OR 17X26 10 PK STRL BLUE (TOWEL DISPOSABLE) IMPLANT
TUBE CONNECTING 12X1/4 (SUCTIONS) ×2 IMPLANT

## 2015-11-08 NOTE — Interval H&P Note (Signed)
History and Physical Interval Note:  11/08/2015 10:42 AM  Michele Andrews  has presented today for surgery, with the diagnosis of PRIMARY HYPERPARATHYROIDISM.  The various methods of treatment have been discussed with the patient and family. After consideration of risks, benefits and other options for treatment, the patient has consented to    Procedure(s): RIGHT INFERIOR PARATHYROIDECTOMY (N/A) as a surgical intervention .    The patient's history has been reviewed, patient examined, no change in status, stable for surgery.  I have reviewed the patient's chart and labs.  Questions were answered to the patient's satisfaction.    Earnstine Regal, MD, [facility] Office: [place]

## 2015-11-08 NOTE — Anesthesia Procedure Notes (Signed)
Procedure Name: Intubation Date/Time: 11/08/2015 11:27 AM Performed by: Julian Reil Pre-anesthesia Checklist: Patient identified, Emergency Drugs available, Suction available and Patient being monitored Patient Re-evaluated:Patient Re-evaluated prior to inductionOxygen Delivery Method: Circle system utilized Preoxygenation: Pre-oxygenation with 100% oxygen Intubation Type: IV induction Ventilation: Mask ventilation without difficulty and Oral airway inserted - appropriate to patient size Laryngoscope Size: Mac and 4 Grade View: Grade I Tube type: Oral Tube size: 7.0 mm Number of attempts: 1 Airway Equipment and Method: Stylet and LTA kit utilized Placement Confirmation: ETT inserted through vocal cords under direct vision,  positive ETCO2 and breath sounds checked- equal and bilateral Secured at: 21 cm Tube secured with: Tape Dental Injury: Teeth and Oropharynx as per pre-operative assessment

## 2015-11-08 NOTE — Transfer of Care (Signed)
Immediate Anesthesia Transfer of Care Note  Patient: Michele Andrews  Procedure(s) Performed: Procedure(s): RIGHT INFERIOR PARATHYROIDECTOMY (N/A)  Patient Location: PACU  Anesthesia Type:General  Level of Consciousness: awake, alert , oriented and patient cooperative  Airway & Oxygen Therapy: Patient Spontanous Breathing and Patient connected to nasal cannula oxygen  Post-op Assessment: Report given to RN and Post -op Vital signs reviewed and stable  Post vital signs: Reviewed and stable  Last Vitals:  Filed Vitals:   11/08/15 0958  BP: 128/87  Pulse: 102  Temp: 36.7 C  Resp: 20    Complications: No apparent anesthesia complications

## 2015-11-08 NOTE — Anesthesia Postprocedure Evaluation (Signed)
Anesthesia Post Note  Patient: Michele Andrews  Procedure(s) Performed: Procedure(s) (LRB): RIGHT INFERIOR PARATHYROIDECTOMY (N/A)  Patient location during evaluation: PACU Anesthesia Type: General Level of consciousness: awake, awake and alert, oriented and patient cooperative Pain management: pain level controlled Vital Signs Assessment: post-procedure vital signs reviewed and stable Respiratory status: spontaneous breathing, nonlabored ventilation, respiratory function stable and patient connected to nasal cannula oxygen Cardiovascular status: blood pressure returned to baseline and stable Anesthetic complications: no    Last Vitals:  Filed Vitals:   11/08/15 1315 11/08/15 1330  BP: 132/86 140/84  Pulse: 87   Temp:    Resp: 13     Last Pain:  Filed Vitals:   11/08/15 1332  PainSc: 5                  Sharla Tankard COKER

## 2015-11-08 NOTE — Op Note (Signed)
OPERATIVE REPORT - PARATHYROIDECTOMY  Preoperative diagnosis: Primary hyperparathyroidism  Postop diagnosis: Same  Procedure: Right minimally invasive parathyroidectomy  Surgeon:  Earnstine Regal, MD, FACS  Assistant:  Sharyn Dross, RNFA  Anesthesia: Gen. endotracheal  Estimated blood loss: Minimal  Preparation: ChloraPrep  Indications: The patient is a 64 year old female who presents with a parathyroid neoplasm. Patient is referred by Dr. Philemon Kingdom for evaluation of suspected primary hyperparathyroidism. Patient has had hypercalcemia dating back to 2013. Recent laboratory studies show an elevated calcium level of 12.1. Intact PTH levels have ranged from 35-81. A 24-hour urine collection for calcium performed 2 years ago was reportedly elevated. Patient has had low vitamin D levels but is currently on treatment with normalization. Bone density scan shows mild to moderate osteopenia. Nuclear med parathyroid scan localizes an adenoma posterior to the right thyroid lobe.  Procedure: Patient was prepared in the holding area. He was brought to operating room and placed in a supine position on the operating room table. Following administration of general anesthesia, the patient was positioned and then prepped and draped in the usual strict aseptic fashion. After ascertaining that an adequate level of anesthesia been achieved, a neck incision was made with a #15 blade. Dissection was carried through subcutaneous tissues and platysma. Hemostasis was obtained with the electrocautery. Skin flaps were developed circumferentially and a Weitlander retractor was placed for exposure.  Strap muscles were incised in the midline. Strap muscles were reflected exposing the thyroid lobe. With gentle blunt dissection the thyroid lobe was mobilized.  Dissection was carried through adipose tissue and an enlarged parathyroid gland was identified. It was gently mobilized. Vascular structures originated  superiorly and were divided between small ligaclips. Care was taken to avoid the recurrent laryngeal nerve and the esophagus, both of which were positively identified. The parathyroid gland was completely excised. It was submitted to pathology where frozen section confirmed parathyroid tissue consistent with adenoma.  Neck was irrigated with warm saline and good hemostasis was noted. Fibrillar was placed in the operative field. Strap muscles were reapproximated in the midline with interrupted 3-0 Vicryl sutures. Platysma was closed with interrupted 3-0 Vicryl sutures. Skin was closed with a running 4-0 Monocryl subcuticular suture. Marcaine was infiltrated circumferentially. Wound was washed and dried and benzoin and Steri-Strips were applied. Sterile gauze dressings were applied. Patient was awakened from anesthesia and brought to the recovery room. The patient tolerated the procedure well.   Earnstine Regal, MD, [facility]

## 2015-11-09 ENCOUNTER — Encounter (HOSPITAL_COMMUNITY): Payer: Self-pay | Admitting: Surgery

## 2015-11-09 DIAGNOSIS — E119 Type 2 diabetes mellitus without complications: Secondary | ICD-10-CM | POA: Diagnosis not present

## 2015-11-09 DIAGNOSIS — N63 Unspecified lump in breast: Secondary | ICD-10-CM | POA: Diagnosis not present

## 2015-11-09 DIAGNOSIS — H04129 Dry eye syndrome of unspecified lacrimal gland: Secondary | ICD-10-CM

## 2015-11-09 DIAGNOSIS — Z79899 Other long term (current) drug therapy: Secondary | ICD-10-CM | POA: Diagnosis not present

## 2015-11-09 DIAGNOSIS — D351 Benign neoplasm of parathyroid gland: Secondary | ICD-10-CM | POA: Diagnosis not present

## 2015-11-09 DIAGNOSIS — Z794 Long term (current) use of insulin: Secondary | ICD-10-CM | POA: Diagnosis not present

## 2015-11-09 DIAGNOSIS — E7800 Pure hypercholesterolemia, unspecified: Secondary | ICD-10-CM | POA: Diagnosis not present

## 2015-11-09 DIAGNOSIS — I10 Essential (primary) hypertension: Secondary | ICD-10-CM | POA: Diagnosis not present

## 2015-11-09 DIAGNOSIS — E210 Primary hyperparathyroidism: Secondary | ICD-10-CM | POA: Diagnosis not present

## 2015-11-09 HISTORY — DX: Dry eye syndrome of unspecified lacrimal gland: H04.129

## 2015-11-09 LAB — BASIC METABOLIC PANEL
ANION GAP: 8 (ref 5–15)
BUN: 14 mg/dL (ref 6–20)
CALCIUM: 11.4 mg/dL — AB (ref 8.9–10.3)
CO2: 29 mmol/L (ref 22–32)
Chloride: 101 mmol/L (ref 101–111)
Creatinine, Ser: 0.82 mg/dL (ref 0.44–1.00)
GLUCOSE: 184 mg/dL — AB (ref 65–99)
POTASSIUM: 4.4 mmol/L (ref 3.5–5.1)
Sodium: 138 mmol/L (ref 135–145)

## 2015-11-09 LAB — GLUCOSE, CAPILLARY: GLUCOSE-CAPILLARY: 156 mg/dL — AB (ref 65–99)

## 2015-11-09 MED ORDER — HYDROCODONE-ACETAMINOPHEN 5-325 MG PO TABS: 1.0000 | ORAL_TABLET | ORAL | Status: DC | PRN

## 2015-11-09 NOTE — Discharge Summary (Signed)
Physician Discharge Summary [facility]  Patient ID: ANIYLAH AVANS MRN: [id number] DOB/AGE: 1951-08-31 64 y.o.  Admit date: 11/08/2015 Discharge date: 11/09/2015  Admission Diagnoses:  Primary hyperparathyroidism  Discharge Diagnoses:  Principal Problem:   Hyperparathyroidism [facility]) Active Problems:   Primary hyperparathyroidism [facility])   Discharged Condition: good  Hospital Course: Patient was admitted for observation following parathyroid surgery.  Post op course was uncomplicated.  Pain was well controlled.  Tolerated diet.  Post op calcium level on morning following surgery was 11.4 mg/dl.  Patient was prepared for discharge home on POD#1.  Consults: None  Treatments: surgery: minimally invasive parathyroidectomy  Discharge Exam: Blood pressure 118/64, pulse 85, temperature 97.8 F (36.6 C), temperature source Oral, resp. rate 16, height 5' 3"  (1.6 m), weight 78.019 kg (172 lb), SpO2 100 %. HEENT - clear Neck - wound dry and intact; voice normal; mild STS Chest - clear bilaterally Cor - RRR   Disposition: Home  Discharge Instructions    Apply dressing    Complete by:  As directed   Apply light gauze dressing to wound before discharge home today.     Diet - low sodium heart healthy    Complete by:  As directed      Discharge instructions    Complete by:  As directed   [facility]  THYROID & PARATHYROID SURGERY:  POST-OP INSTRUCTIONS  Always review your discharge instruction sheet from the facility where your surgery was performed.  A prescription for pain medication may be given to you upon discharge.  Take your pain medication as prescribed.  If narcotic pain medicine is not needed, then you may take acetaminophen (Tylenol) or ibuprofen (Advil) as needed.  Take your usually prescribed medications unless otherwise directed.  If you need a refill on your pain medication, please contact your pharmacy. They will contact our  office to request authorization.  Prescriptions will not be processed by our office after 5 pm or on weekends.  Start with a light diet upon arrival home, such as soup and crackers or toast.  Be sure to drink plenty of fluids daily.  Resume your normal diet the day after surgery.  Most patients will experience some swelling and bruising on the chest and neck area.  Ice packs will help.  Swelling and bruising can take several days to resolve.   It is common to experience some constipation after surgery.  Increasing fluid intake and taking a stool softener will usually help or prevent this problem.  A mild laxative (Milk of Magnesia or Miralax) should be taken according to package directions if there has been no bowel movement after 48 hours.  You have steri-strips and a gauze dressing over your incision.  You may remove the gauze bandage on the second day after surgery, and you may shower at that time.  Leave your steri-strips (small skin tapes) in place directly over the incision.  These strips should remain on the skin for 5-7 days and then be removed.  You may get them wet in the shower and pat them dry.  You may resume regular (light) daily activities beginning the next day - such as daily self-care, walking, climbing stairs - gradually increasing activities as tolerated.  You may have sexual intercourse when it is comfortable.  Refrain from any heavy lifting or straining until approved by your doctor.  You may drive when you no longer are taking prescription pain medication, you can comfortably wear a seatbelt, and  you can safely maneuver your car and apply brakes.  You should see your doctor in the office for a follow-up appointment approximately two to three weeks after your surgery.  Make sure that you call for this appointment within a day or two after you arrive home to insure a convenient appointment time.  WHEN TO CALL YOUR DOCTOR: -- Fever greater than 101.5 -- Inability to urinate --  Nausea and/or vomiting - persistent -- Extreme swelling or bruising -- Continued bleeding from incision -- Increased pain, redness, or drainage from the incision -- Difficulty swallowing or breathing -- Muscle cramping or spasms -- Numbness or tingling in hands or around lips  The clinic staff is available to answer your questions during regular business hours.  Please don't hesitate to call and ask to speak to one of the nurses if you have concerns.  Earnstine Regal, MD, [facility] Office: [phone]  Website: [email or web address]     Increase activity slowly    Complete by:  As directed      Remove dressing in 24 hours    Complete by:  As directed             Medication List    TAKE these medications        accu-chek soft touch lancets  Use to check blood sugar 2 times a day     cholecalciferol 1000 UNITS tablet  Commonly known as:  VITAMIN D  Take 2,000 Units by mouth daily.     furosemide 40 MG tablet  Commonly known as:  LASIX  Take 1 tablet (40 mg total) by mouth daily.     glucose blood test strip  Commonly known as:  FREESTYLE TEST STRIPS  Use to check blood sugar 3 to 4 times daily. diag code 250.00. Insulin dependent     HYDROcodone-acetaminophen 5-325 MG tablet  Commonly known as:  NORCO/VICODIN  Take 1-2 tablets by mouth every 4 (four) hours as needed for moderate pain.     Insulin Detemir 100 UNIT/ML Pen  Commonly known as:  LEVEMIR  Inject 58 Units into the skin at bedtime.     Insulin Pen Needle 31G X 5 MM Misc  1 each by Does not apply route 3 (three) times daily before meals.     lisinopril 10 MG tablet  Commonly known as:  PRINIVIL,ZESTRIL  Take 1 tablet (10 mg total) by mouth daily.     metFORMIN 1000 MG tablet  Commonly known as:  GLUCOPHAGE  Take 1 tablet (1,000 mg total) by mouth 2 (two) times daily with a meal.     metoprolol succinate 25 MG 24 hr tablet  Commonly  known as:  TOPROL XL  Take 0.5 tablets (12.5 mg total) by mouth daily.     naproxen sodium 220 MG tablet  Commonly known as:  ANAPROX  Take 220 mg by mouth 2 (two) times daily as needed.     OVER THE COUNTER MEDICATION  Place 1 drop into both eyes daily as needed (eye lubricant for dry eyes).     pravastatin 40 MG tablet  Commonly known as:  PRAVACHOL  Take 1 tablet (40 mg total) by mouth every evening.     TRUETRACK BLOOD GLUCOSE W/DEVICE Kit  Use to check blood sugar 2 times a day           Follow-up Information    Follow up with Earnstine Regal, MD. Schedule an appointment as soon  as possible for a visit in 3 weeks.   Specialty:  General Surgery   Why:  For wound re-check   Contact information:   [place] [address] [phone]       Earnstine Regal, MD, [facility] Office: [phone]   Signed: Earnstine Regal 11/09/2015, 8:07 AM

## 2015-11-12 ENCOUNTER — Ambulatory Visit (INDEPENDENT_AMBULATORY_CARE_PROVIDER_SITE_OTHER): Payer: Commercial Managed Care - HMO | Admitting: Internal Medicine

## 2015-11-12 ENCOUNTER — Encounter: Payer: Self-pay | Admitting: Internal Medicine

## 2015-11-12 ENCOUNTER — Ambulatory Visit: Payer: Commercial Managed Care - HMO | Admitting: Internal Medicine

## 2015-11-12 VITALS — BP 114/72 | HR 97 | Temp 98.1°F | Resp 12 | Wt 181.0 lb

## 2015-11-12 DIAGNOSIS — E210 Primary hyperparathyroidism: Secondary | ICD-10-CM | POA: Diagnosis not present

## 2015-11-12 DIAGNOSIS — E559 Vitamin D deficiency, unspecified: Secondary | ICD-10-CM | POA: Diagnosis not present

## 2015-11-12 DIAGNOSIS — E213 Hyperparathyroidism, unspecified: Secondary | ICD-10-CM

## 2015-11-12 NOTE — Patient Instructions (Signed)
Pease come back for labs in 1 month and for a visit in 1 year.

## 2015-11-12 NOTE — Progress Notes (Addendum)
Patient ID: Michele Andrews, female   DOB: May 20, 1951, 64 y.o.   MRN: [id number]  HPI  Michele Andrews is a 64 y.o.-year-old female, returning for f/u for primary hyperparathyroidism and vit D deficiency. Last visit 3 mo ago.  Since last visit, she had R inf parathyroidectomy 11/08/2015 by Dr Harlow Asa >> parathyroid adenoma, 0.4 g. Postop 1 day calcium still high, at 11.4. She tells me she does not feel much different after her surgery. No complications after the surgery.  Reviewed and addended hx: Pt was dx with hypercalcemia in 2008. I reviewed pt's pertinent labs: Lab Results  Component Value Date   PTH 35 08/13/2015   PTH 75* 03/09/2015   PTH Comment 03/09/2015   PTH 77* 08/03/2014   PTH 46.8 01/05/2012   PTH 81.2* 03/21/2011   PTH 57.4 01/10/2010   CALCIUM 11.4* 11/09/2015   CALCIUM 11.5* 10/29/2015   CALCIUM 12.1* 08/13/2015   CALCIUM 10.7* 03/09/2015   CALCIUM 11.1* 03/09/2015   CALCIUM 11.9* 08/03/2014   CALCIUM 11.4* 08/03/2014   CALCIUM 10.5 03/04/2014   CALCIUM 10.7* 09/10/2013   CALCIUM 10.9* 03/05/2013   I reviewed pt's DEXA scans: Date L1-L4 T score FN T score 33% distal Radius  11/30/2014   -1.8 (+6.3% *) right: -1.8, left: -1.6  n/a  02/08/2012  -2.0 (L2 was excluded!)  right: -1.6, left: -1.6  n/a  No fractures or falls.   No h/o kidney stones.  She collected a 24h urine for calcium in 2012 >> elevated: Component     Latest Ref Rng 03/21/2011  Calcium, Ur      24  Calcium, 24 hour urine     100 - 250 mg/day 312 (H)   No h/o CKD. Last BUN/Cr: Lab Results  Component Value Date   BUN 14 11/09/2015   CREATININE 0.82 11/09/2015   Pt is not on HCTZ.  She also has a h/o vitamin D deficiency. Last 3 vit D levels: 04/12/2015: 16.7 - We started 2000 units vitamin D daily. 07/28/2015: 44.8 08/13/2015: 43.6  Reviewed labs from last visit:  Component     Latest Ref Rng 08/13/2015          Sodium     135 - 146 mmol/L 139  Potassium     3.5 - 5.3 mmol/L  4.3  Chloride     98 - 110 mmol/L 100  CO2     20 - 31 mmol/L 30  Glucose     65 - 99 mg/dL 116 (H)  BUN     7 - 25 mg/dL 9  Creatinine     0.50 - 0.99 mg/dL 0.66  Calcium     8.6 - 10.4 mg/dL 12.1 (H)  GFR, Est African American     >=60 mL/min >89  GFR, Est Non African American     >=60 mL/min >89  Vitamin D 1, 25 (OH) Total     18 - 72 pg/mL 84 (H)  Vitamin D3 1, 25 (OH)      84  Vitamin D2 1, 25 (OH)      <8  PTH     15 - 65 pg/mL 35  Phosphorus     2.3 - 4.6 mg/dL 3.2  VITD     30.00 - 100.00 ng/mL 43.63  Magnesium     1.5 - 2.5 mg/dL 1.8   Labs point towards primary hyperparathyroidism, with a very high calcium, unsuppressed PTH, high 1, 25 dihydroxy vitamin D and normal 25 hydroxy  vitamin D. Phosphorus is slightly higher than expected, at 3.2, however, this is consistent with PTH value within the normal range. Of note, patient also had a high urinary calcium in the past.   She also has a history of hypertension, hyperlipidemia, GERD.  She was started on Metoprolol XL 12.5 mg (1/2 tab) as her HR before surgery.  ROS: Constitutional: no weight loss/gain, no fatigue Eyes: no blurry vision, no xerophthalmia ENT: no sore throat, no nodules palpated in throat, no dysphagia/odynophagia, + hoarseness (also had this before surgery) Cardiovascular: no CP/SOB/palpitations/leg swelling Respiratory: no cough/SOB Gastrointestinal: no N/V/D/+ C/no heartburn Musculoskeletal: no muscle/joint aches Skin: no rashes, + hair loss Neurological: no tremors/numbness/tingling/dizziness, + headache  I reviewed pt's medications, allergies, PMH, social hx, family hx, and changes were documented in the history of present illness. Otherwise, unchanged from my initial visit note:  Past Medical History  Diagnosis Date  . Hypertension   . Hypercalcemia   . Hyperlipidemia   . Carpal tunnel syndrome   . Rotator cuff syndrome of right shoulder   . Osteopenia   . Family history of adverse  reaction to anesthesia     20 YRS. AGO, HER 20 YR. OLD NEPHEW, ENDED UP ON VENTILATOR- PT. THINKS ITS BECAUSE HE ASPIRATED   . Diabetes mellitus     Type II- diagnosed 30 yrs. ago   Past Surgical History  Procedure Laterality Date  . Abdominal hysterectomy    . Breast surgery Left 1963    cyst removed   . Parathyroidectomy Right 11/08/2015  . Parathyroidectomy N/A 11/08/2015    Procedure: RIGHT INFERIOR PARATHYROIDECTOMY;  Surgeon: Armandina Gemma, MD;  Location: [place];  Service: General;  Laterality: N/A;   History   Social History  . Marital Status: Married    Spouse Name: N/A  . Number of Children: 3  . Years of Education: 12   Occupational History  . clinical sitter Previously nurse tech  [place] History Main Topics  . Smoking status: Never Smoker   . Smokeless tobacco: Not on file  . Alcohol Use: No  . Drug Use: No   Current Outpatient Prescriptions on File Prior to Visit  Medication Sig Dispense Refill  . Blood Glucose Monitoring Suppl (TRUETRACK BLOOD GLUCOSE) W/DEVICE KIT Use to check blood sugar 2 times a day     . cholecalciferol (VITAMIN D) 1000 UNITS tablet Take 2,000 Units by mouth daily.    . furosemide (LASIX) 40 MG tablet Take 1 tablet (40 mg total) by mouth daily. 90 tablet 3  . glucose blood (FREESTYLE TEST STRIPS) test strip Use to check blood sugar 3 to 4 times daily. diag code 250.00. Insulin dependent 100 each 6  . HYDROcodone-acetaminophen (NORCO/VICODIN) 5-325 MG tablet Take 1-2 tablets by mouth every 4 (four) hours as needed for moderate pain. 20 tablet 0  . Insulin Detemir (LEVEMIR) 100 UNIT/ML Pen Inject 58 Units into the skin at bedtime. 15 mL 11  . Insulin Pen Needle 31G X 5 MM MISC 1 each by Does not apply route 3 (three) times daily before meals. 100 each 5  . Lancets (ACCU-CHEK SOFT TOUCH) lancets Use to check blood sugar 2 times a day 100 each 5  . lisinopril (PRINIVIL,ZESTRIL) 10 MG tablet Take 1 tablet (10 mg total) by mouth  daily. 90 tablet 4  . metFORMIN (GLUCOPHAGE) 1000 MG tablet Take 1 tablet (1,000 mg total) by mouth 2 (two) times daily with a meal. 180 tablet 4  .  metoprolol succinate (TOPROL XL) 25 MG 24 hr tablet Take 0.5 tablets (12.5 mg total) by mouth daily. (Patient taking differently: Take 12.5 mg by mouth daily before breakfast. ) 30 tablet 1  . naproxen sodium (ANAPROX) 220 MG tablet Take 220 mg by mouth 2 (two) times daily as needed.    Marland Kitchen OVER THE COUNTER MEDICATION Place 1 drop into both eyes daily as needed (eye lubricant for dry eyes).    . pravastatin (PRAVACHOL) 40 MG tablet Take 1 tablet (40 mg total) by mouth every evening. 30 tablet 5   No current facility-administered medications on file prior to visit.   No Known Allergies  PE: BP 114/72 mmHg  Pulse 97  Temp(Src) 98.1 F (36.7 C) (Oral)  Resp 12  Wt 181 lb (82.101 kg)  SpO2 98% Body mass index is 32.07 kg/(m^2). Wt Readings from Last 3 Encounters:  11/12/15 181 lb (82.101 kg)  11/08/15 172 lb (78.019 kg)  10/29/15 172 lb 14.4 oz (78.427 kg)   Constitutional: overweight, in NAD. No kyphosis. Eyes: PERRLA, EOMI, no exophthalmos ENT: moist mucous membranes, cervical scar still swollen, covered with Band-Aid, no cervical lymphadenopathy Cardiovascular: Tachycardia, RR, No MRG Respiratory: CTA B Gastrointestinal: abdomen soft, NT, ND, BS+ Musculoskeletal: no deformities, strength intact in all 4 Skin: moist, warm, no rashes Neurological: no tremor with outstretched hands, DTR normal in all 4  Assessment: 1. Hypercalcemia/hyperparathyroidism  2. Vitamin D deficiency  Plan: 1. And 2. Patient has had several instances of elevated calcium, with the highest level being at 12.1. A corresponding intact PTH level was not suppressed, at 35. Previous parathyroid hormone levels were elevated. A 24-hour urine calcium was elevated in 2012. No apparent complications from hypercalcemia: no h/o nephrolithiasis, no osteoporosis (but has  osteopenia), no fractures. No abdominal pain, depression, bone pain. She also had vitamin D deficiency, so we started vitamin D supplementation and investigated her PTH elevation after normalization of vitamin D. The labs pointed towards Primary Hyperparathyroidism >> referred to surgery. - pt had R inf parathyroidectomy  4 days ago. A calcium level  one day postop was 11.4, but this was an improvement from previous levels - It is too soon to recheck her calcium level, however, I would like to check the following labs in 1 month: Orders Placed This Encounter  Procedures  . PTH, Intact and Calcium  . Vitamin D, 25-hydroxy  - I will see the patient back in a year, and, if labs normal at that time, she can continue to be evaluated by PCP with yearly calcium levels  2. Vitamin D deficiency - I advised the patient to start 4000 units of vitamin D daily after her vitamin D returned low, at 16, 6 months ago. She misunderstood the instructions and started only 2000 units daily, however, this was enough to raise her vit D in the 40s 2 in 07/2015 in 08/2015. - We'll continue to 2000 units of vitamin D daily and recheck vitamin D in a month.   Component     Latest Ref Rng 12/14/2015 12/14/2015        10:26 AM 10:26 AM  Calcium     8.7 - 10.3 mg/dL  9.5  PTH     15 - 65 pg/mL  21  VITD     30.00 - 100.00 ng/mL 47.20    EXCELLENT results!!!

## 2015-11-16 DIAGNOSIS — E119 Type 2 diabetes mellitus without complications: Secondary | ICD-10-CM | POA: Diagnosis not present

## 2015-11-16 DIAGNOSIS — Z794 Long term (current) use of insulin: Secondary | ICD-10-CM | POA: Diagnosis not present

## 2015-11-23 ENCOUNTER — Other Ambulatory Visit: Payer: Self-pay

## 2015-11-23 MED ORDER — LISINOPRIL 10 MG PO TABS: 10.0000 mg | ORAL_TABLET | Freq: Every day | ORAL | Status: DC

## 2015-11-23 NOTE — Telephone Encounter (Signed)
Pt is out of medication

## 2015-11-25 DIAGNOSIS — E210 Primary hyperparathyroidism: Secondary | ICD-10-CM | POA: Diagnosis not present

## 2015-12-14 ENCOUNTER — Other Ambulatory Visit (INDEPENDENT_AMBULATORY_CARE_PROVIDER_SITE_OTHER): Payer: Commercial Managed Care - HMO

## 2015-12-14 DIAGNOSIS — E210 Primary hyperparathyroidism: Secondary | ICD-10-CM

## 2015-12-14 DIAGNOSIS — E559 Vitamin D deficiency, unspecified: Secondary | ICD-10-CM | POA: Diagnosis not present

## 2015-12-14 LAB — VITAMIN D 25 HYDROXY (VIT D DEFICIENCY, FRACTURES): VITD: 47.2 ng/mL (ref 30.00–100.00)

## 2015-12-15 LAB — PTH, INTACT AND CALCIUM
CALCIUM: 9.5 mg/dL (ref 8.7–10.3)
PTH: 21 pg/mL (ref 15–65)

## 2015-12-15 MED FILL — LEVEMIR FLEXTOUCH 100 UNITS: 100 | 30 days supply | Qty: 15 | Fill #3

## 2015-12-29 MED FILL — FUROSEMIDE 40 MG TABLET: 40 | 90 days supply | Qty: 90 | Fill #1

## 2015-12-29 MED FILL — PRAVASTATIN NA 40 MG TAB: 40 | 30 days supply | Qty: 30 | Fill #1

## 2016-01-06 MED FILL — metFORMIN HCL 1000 MG TABS: 1000 | 90 days supply | Qty: 180 | Fill #2

## 2016-01-10 MED FILL — LEVEMIR FLEXTOUCH 100 UNITS: 100 | 30 days supply | Qty: 15 | Fill #4

## 2016-01-12 ENCOUNTER — Other Ambulatory Visit: Payer: Self-pay | Admitting: Internal Medicine

## 2016-01-12 DIAGNOSIS — R000 Tachycardia, unspecified: Secondary | ICD-10-CM

## 2016-01-12 MED ORDER — METOPROLOL SUCCINATE ER 25 MG PO TB24: 12.5000 mg | ORAL_TABLET | Freq: Every day | ORAL | Status: DC

## 2016-01-12 MED FILL — METOPROLOL SUCC ER 25 MG TA: 25 | 90 days supply | Qty: 45 | Fill #0

## 2016-01-12 NOTE — Telephone Encounter (Signed)
charsetta pt needs appt 

## 2016-01-12 NOTE — Telephone Encounter (Signed)
Pt requesting metoprolol to be filled @ outpatient pharmacy.

## 2016-02-07 MED FILL — LEVEMIR FLEXTOUCH 100 UNITS: 100 | 30 days supply | Qty: 15 | Fill #5

## 2016-02-23 ENCOUNTER — Encounter: Payer: Self-pay | Admitting: Internal Medicine

## 2016-02-23 ENCOUNTER — Ambulatory Visit (INDEPENDENT_AMBULATORY_CARE_PROVIDER_SITE_OTHER): Payer: Commercial Managed Care - HMO | Admitting: Internal Medicine

## 2016-02-23 VITALS — BP 148/84 | HR 97 | Temp 97.7°F | Ht 63.0 in | Wt 174.6 lb

## 2016-02-23 DIAGNOSIS — M1712 Unilateral primary osteoarthritis, left knee: Secondary | ICD-10-CM | POA: Insufficient documentation

## 2016-02-23 DIAGNOSIS — E1165 Type 2 diabetes mellitus with hyperglycemia: Secondary | ICD-10-CM

## 2016-02-23 DIAGNOSIS — G8929 Other chronic pain: Secondary | ICD-10-CM | POA: Diagnosis not present

## 2016-02-23 DIAGNOSIS — R000 Tachycardia, unspecified: Secondary | ICD-10-CM | POA: Diagnosis not present

## 2016-02-23 DIAGNOSIS — E785 Hyperlipidemia, unspecified: Secondary | ICD-10-CM

## 2016-02-23 DIAGNOSIS — IMO0001 Reserved for inherently not codable concepts without codable children: Secondary | ICD-10-CM

## 2016-02-23 DIAGNOSIS — I10 Essential (primary) hypertension: Secondary | ICD-10-CM | POA: Diagnosis not present

## 2016-02-23 DIAGNOSIS — Z79899 Other long term (current) drug therapy: Secondary | ICD-10-CM

## 2016-02-23 DIAGNOSIS — Z794 Long term (current) use of insulin: Secondary | ICD-10-CM | POA: Diagnosis not present

## 2016-02-23 DIAGNOSIS — Z0000 Encounter for general adult medical examination without abnormal findings: Secondary | ICD-10-CM

## 2016-02-23 HISTORY — DX: Unilateral primary osteoarthritis, left knee: M17.12

## 2016-02-23 LAB — GLUCOSE, CAPILLARY: Glucose-Capillary: 129 mg/dL — ABNORMAL HIGH (ref 65–99)

## 2016-02-23 LAB — POCT GLYCOSYLATED HEMOGLOBIN (HGB A1C): Hemoglobin A1C: 8.7

## 2016-02-23 MED ORDER — METOPROLOL SUCCINATE ER 25 MG PO TB24: 25.0000 mg | ORAL_TABLET | Freq: Every day | ORAL | Status: DC

## 2016-02-23 MED ORDER — FUROSEMIDE 20 MG PO TABS: 20.0000 mg | ORAL_TABLET | Freq: Every day | ORAL | Status: DC

## 2016-02-23 MED ORDER — DICLOFENAC SODIUM 1 % TD GEL: 2.0000 g | Freq: Four times a day (QID) | TRANSDERMAL | Status: DC

## 2016-02-23 MED ORDER — IBUPROFEN 800 MG PO TABS: 800.0000 mg | ORAL_TABLET | Freq: Three times a day (TID) | ORAL | Status: DC | PRN

## 2016-02-23 MED FILL — IBUPROFEN 800 MG TABLET: 800 | 10 days supply | Qty: 30 | Fill #0

## 2016-02-23 NOTE — Assessment & Plan Note (Signed)
Pt has chronic left knee pain from OA. She is requesting ibuprofen for pain.  - rx for ibuprofen 800mg  TID prn for pain, instructed not to take it for more than 7 consecutive days.  - rx for voltaren gel

## 2016-02-23 NOTE — Progress Notes (Signed)
Subjective:   Patient ID: Michele Andrews female   DOB: 09/07/1951 65 y.o.   MRN: [id number]  HPI: Michele Andrews is a 65 y.o. with past medical history as outlined below who presents to clinic for diabetes follow up.   Please see problem list for status of the pt's chronic medical problems.  Past Medical History  Diagnosis Date  . Hypertension   . Hypercalcemia   . Hyperlipidemia   . Carpal tunnel syndrome   . Rotator cuff syndrome of right shoulder   . Osteopenia   . Family history of adverse reaction to anesthesia     20 YRS. AGO, HER 20 YR. OLD NEPHEW, ENDED UP ON VENTILATOR- PT. THINKS ITS BECAUSE HE ASPIRATED   . Diabetes mellitus     Type II- diagnosed 30 yrs. ago   Current Outpatient Prescriptions  Medication Sig Dispense Refill  . Blood Glucose Monitoring Suppl (TRUETRACK BLOOD GLUCOSE) W/DEVICE KIT Use to check blood sugar 2 times a day     . cholecalciferol (VITAMIN D) 1000 UNITS tablet Take 2,000 Units by mouth daily.    . furosemide (LASIX) 40 MG tablet Take 1 tablet (40 mg total) by mouth daily. 90 tablet 3  . glucose blood (FREESTYLE TEST STRIPS) test strip Use to check blood sugar 3 to 4 times daily. diag code 250.00. Insulin dependent 100 each 6  . HYDROcodone-acetaminophen (NORCO/VICODIN) 5-325 MG tablet Take 1-2 tablets by mouth every 4 (four) hours as needed for moderate pain. 20 tablet 0  . Insulin Detemir (LEVEMIR) 100 UNIT/ML Pen Inject 58 Units into the skin at bedtime. 15 mL 11  . Insulin Pen Needle 31G X 5 MM MISC 1 each by Does not apply route 3 (three) times daily before meals. 100 each 5  . Lancets (ACCU-CHEK SOFT TOUCH) lancets Use to check blood sugar 2 times a day 100 each 5  . lisinopril (PRINIVIL,ZESTRIL) 10 MG tablet Take 1 tablet (10 mg total) by mouth daily. 90 tablet 4  . metFORMIN (GLUCOPHAGE) 1000 MG tablet Take 1 tablet (1,000 mg total) by mouth 2 (two) times daily with a meal. 180 tablet 4  . metoprolol succinate (TOPROL XL) 25 MG 24  hr tablet Take 0.5 tablets (12.5 mg total) by mouth daily before breakfast. 90 tablet 3  . naproxen sodium (ANAPROX) 220 MG tablet Take 220 mg by mouth 2 (two) times daily as needed.    Marland Kitchen OVER THE COUNTER MEDICATION Place 1 drop into both eyes daily as needed (eye lubricant for dry eyes).    . pravastatin (PRAVACHOL) 40 MG tablet Take 1 tablet (40 mg total) by mouth every evening. 30 tablet 5   No current facility-administered medications for this visit.   Family History  Problem Relation Age of Onset  . Hypertension Mother   . Cancer Mother    Social History   Social History  . Marital Status: Married    Spouse Name: N/A  . Number of Children: N/A  . Years of Education: 12   Occupational History  . nurse tech [employer]   Social History Main Topics  . Smoking status: Never Smoker   . Smokeless tobacco: Never Used  . Alcohol Use: No  . Drug Use: No  . Sexual Activity: Not Asked   Other Topics Concern  . None   Social History Narrative   Review of Systems: Review of Systems  Constitutional: Negative for fever and chills.  Respiratory: Negative for shortness of breath.  Cardiovascular: Positive for palpitations (at rest). Negative for chest pain.  Gastrointestinal: Negative for nausea and vomiting.  Musculoskeletal:       Left knee OA   Endo/Heme/Allergies: Negative for polydipsia (feels like she does not drink enough water).  All other systems reviewed and are negative.   Objective:  Physical Exam: Filed Vitals:   02/23/16 1326  BP: 148/84  Pulse: 97  Temp: 97.7 F (36.5 C)  TempSrc: Oral  Height: _0  (1.6 m)  Weight: 174 lb 9.6 oz (79.198 kg)  SpO2: 100%   Physical Exam  Constitutional: She appears well-developed and well-nourished. No distress.  HENT:  Head: Normocephalic and atraumatic.  Eyes: Conjunctivae and EOM are normal. Right eye exhibits no discharge. Left eye exhibits no discharge. No scleral icterus.  Cardiovascular: Regular rhythm and  intact distal pulses.  Tachycardia present.  Exam reveals no gallop and no friction rub.   No murmur heard. Pulmonary/Chest: Effort normal and breath sounds normal. No respiratory distress. She has no wheezes. She has no rales. She exhibits no tenderness.  Abdominal: Soft. Bowel sounds are normal. She exhibits no distension and no mass. There is no tenderness. There is no rebound and no guarding.  Skin: Skin is warm and dry. No rash noted. She is not diaphoretic. No erythema. No pallor.   Assessment & Plan:   Please see problem based assessment and plan.

## 2016-02-23 NOTE — Assessment & Plan Note (Signed)
Lab Results  Component Value Date   HGBA1C 8.7 02/23/2016   HGBA1C 9.1* 10/29/2015   HGBA1C 9.0 07/28/2015     Assessment: Diabetes control:  not controlled Progress toward A1C goal:   progressing towards goal Comments: on lantus 58 units qhs and metformin 1000mg  BID. She did not bring her glucometer in. She reports the lowest reading is 68 after which she ate and her CBG went up to the 90's. Her highest reported reading was 200. She has also been taking OTC cinnamon which she says has helped lowered her CBGs.   Plan: Medications:  continue current medications Home glucose monitoring: Frequency:  TID Timing:  w meals Instruction/counseling given: reminded to bring blood glucose meter & log to each visit, discussed foot care, discussed the need for weight loss and discussed diet Educational resources provided:   Self management tools provided:   Other plans: f/u in 1 month w/ glucometer report. Foot exam done today.

## 2016-02-23 NOTE — Assessment & Plan Note (Signed)
BP Readings from Last 3 Encounters:  02/23/16 148/84  11/12/15 114/72  11/09/15 118/64    Lab Results  Component Value Date   NA 138 11/09/2015   K 4.4 11/09/2015   CREATININE 0.82 11/09/2015    Assessment: Blood pressure control:  elevated Progress toward BP goal:   deteriorated  Comments: on lopressor 12.5, lisinopril 10mg , and lasix 40mg .   Plan: Medications:  Increased lopressor xl to 25mg  qd, decreased lasix to 20mg  in case of dehydration causing tachycardia, continue lisinopril 10mg  qd Educational resources provided:   Self management tools provided:   Other plans: f/u in 1 month for BP check.

## 2016-02-23 NOTE — Assessment & Plan Note (Signed)
Referral placed for a colonoscopy.

## 2016-02-23 NOTE — Patient Instructions (Signed)
You can take ibuprofen 800mg  three times a day as need for pain. Do not take it consecutively for more than 7 days.   You can use voltaren gel applied topically to your knee to help with pain.   Start taking metoprolol 25mg  daily for your high heart rate.   I decreased your lasix dose to 20mg  daily.   We will see you in 1 month for a blood pressure, heart rate, and diabetes follow up.

## 2016-02-23 NOTE — Assessment & Plan Note (Addendum)
Pt has asymptomatic sinus tachycardia. Denies CP and SOB.   - Increased lopressor xl to 25mg  qd - decreased lasix to 20mg  in case of dehydration causing tachycardia - checking TSH level -- TSH wnl.  - follow up in 1 month.

## 2016-02-24 LAB — LIPID PANEL
CHOL/HDL RATIO: 3.5 ratio (ref 0.0–4.4)
Cholesterol, Total: 191 mg/dL (ref 100–199)
HDL: 55 mg/dL (ref >39)
LDL CALC: 111 mg/dL — AB (ref 0–99)
TRIGLYCERIDES: 124 mg/dL (ref 0–149)
VLDL CHOLESTEROL CAL: 25 mg/dL (ref 5–40)

## 2016-02-24 LAB — TSH: TSH: 0.829 u[IU]/mL (ref 0.450–4.500)

## 2016-02-24 NOTE — Assessment & Plan Note (Signed)
Lipid panel checked today. Her ASCVD 10 year risk is 26.8%. She is currently on pravastatin 40mg . Will discuss increasing her statin to a high intensity statin, either lipitor or rosuvastatin at next visit in 1 month.

## 2016-02-25 NOTE — Progress Notes (Signed)
Internal Medicine Clinic Attending  Case discussed with Dr. Truong at the time of the visit.  We reviewed the resident's history and exam and pertinent patient test results.  I agree with the assessment, diagnosis, and plan of care documented in the resident's note.  

## 2016-03-01 ENCOUNTER — Encounter: Payer: Self-pay | Admitting: *Deleted

## 2016-03-01 MED FILL — LEVEMIR FLEXTOUCH 100 UNITS: 100 | 30 days supply | Qty: 15 | Fill #6

## 2016-03-01 MED FILL — PRAVASTATIN NA 40 MG TAB: 40 | 30 days supply | Qty: 30 | Fill #2

## 2016-03-14 MED FILL — LISINOPRIL 10 MG TABLET: 10 | 90 days supply | Qty: 90 | Fill #1

## 2016-03-14 MED FILL — METOPROLOL SUCC ER 25 MG TA: 25 | 90 days supply | Qty: 90 | Fill #0

## 2016-03-29 ENCOUNTER — Other Ambulatory Visit: Payer: Self-pay | Admitting: Internal Medicine

## 2016-03-29 MED FILL — LEVEMIR FLEXTOUCH 100 UNITS: 100 | 24 days supply | Qty: 12 | Fill #0

## 2016-03-29 NOTE — Telephone Encounter (Signed)
Last appt 02/23/16.

## 2016-03-31 NOTE — Addendum Note (Signed)
Addended by: Hulan Fray on: 03/31/2016 06:05 PM   Modules accepted: Orders

## 2016-04-06 MED FILL — metFORMIN HCL 1000 MG TABS: 1000 | 90 days supply | Qty: 180 | Fill #3

## 2016-04-17 MED FILL — LEVEMIR FLEXTOUCH 100 UNITS: 100 | 24 days supply | Qty: 12 | Fill #1

## 2016-04-26 MED FILL — FUROSEMIDE 40 MG TABLET: 40 | 90 days supply | Qty: 90 | Fill #2

## 2016-05-05 MED FILL — LEVEMIR FLEXTOUCH 100 UNITS: 100 | 24 days supply | Qty: 12 | Fill #2

## 2016-05-25 MED FILL — LEVEMIR FLEXTOUCH 100 UNITS: 100 | 24 days supply | Qty: 12 | Fill #3

## 2016-06-16 MED FILL — LEVEMIR FLEXTOUCH 100 UNITS: 100 | 24 days supply | Qty: 12 | Fill #4

## 2016-06-27 MED FILL — LISINOPRIL 10 MG TABLET: 10 | 90 days supply | Qty: 90 | Fill #2

## 2016-06-27 MED FILL — METOPROLOL SUCC ER 25 MG TA: 25 | 90 days supply | Qty: 90 | Fill #1

## 2016-07-05 MED FILL — LEVEMIR FLEXTOUCH 100 UNITS: 100 | 24 days supply | Qty: 12 | Fill #5

## 2016-07-07 ENCOUNTER — Other Ambulatory Visit: Payer: Self-pay | Admitting: *Deleted

## 2016-07-09 MED ORDER — METFORMIN HCL 1000 MG PO TABS: 1000.0000 mg | ORAL_TABLET | Freq: Two times a day (BID) | ORAL | 4 refills | Status: DC

## 2016-07-10 MED FILL — metFORMIN HCL 1000 MG TABS: 1000 | 90 days supply | Qty: 180 | Fill #0

## 2016-07-14 ENCOUNTER — Other Ambulatory Visit: Payer: Self-pay | Admitting: Internal Medicine

## 2016-07-14 DIAGNOSIS — M1712 Unilateral primary osteoarthritis, left knee: Secondary | ICD-10-CM

## 2016-07-17 MED FILL — IBUPROFEN 800 MG TABLET: 800 | 10 days supply | Qty: 30 | Fill #0

## 2016-07-20 DIAGNOSIS — H00014 Hordeolum externum left upper eyelid: Secondary | ICD-10-CM | POA: Diagnosis not present

## 2016-07-28 MED FILL — LEVEMIR FLEXTOUCH 100 UNITS: 100 | 24 days supply | Qty: 12 | Fill #6

## 2016-07-31 MED FILL — FUROSEMIDE 40 MG TABLET: 40 | 90 days supply | Qty: 90 | Fill #3

## 2016-08-22 ENCOUNTER — Ambulatory Visit (INDEPENDENT_AMBULATORY_CARE_PROVIDER_SITE_OTHER): Payer: Commercial Managed Care - HMO | Admitting: Internal Medicine

## 2016-08-22 VITALS — BP 109/66 | HR 96 | Temp 98.6°F | Ht 63.0 in | Wt 169.9 lb

## 2016-08-22 DIAGNOSIS — Z23 Encounter for immunization: Secondary | ICD-10-CM | POA: Diagnosis not present

## 2016-08-22 DIAGNOSIS — E1165 Type 2 diabetes mellitus with hyperglycemia: Secondary | ICD-10-CM

## 2016-08-22 DIAGNOSIS — Z794 Long term (current) use of insulin: Secondary | ICD-10-CM

## 2016-08-22 DIAGNOSIS — Z6830 Body mass index (BMI) 30.0-30.9, adult: Secondary | ICD-10-CM

## 2016-08-22 DIAGNOSIS — Z0000 Encounter for general adult medical examination without abnormal findings: Secondary | ICD-10-CM

## 2016-08-22 DIAGNOSIS — IMO0001 Reserved for inherently not codable concepts without codable children: Secondary | ICD-10-CM

## 2016-08-22 LAB — POCT GLYCOSYLATED HEMOGLOBIN (HGB A1C): Hemoglobin A1C: 10.6

## 2016-08-22 LAB — GLUCOSE, CAPILLARY: GLUCOSE-CAPILLARY: 236 mg/dL — AB (ref 65–99)

## 2016-08-22 MED ORDER — INSULIN GLARGINE 100 UNIT/ML SOLOSTAR PEN: 64.0000 [IU] | PEN_INJECTOR | Freq: Every day | SUBCUTANEOUS | 11 refills | Status: DC

## 2016-08-22 MED ORDER — LIRAGLUTIDE 18 MG/3ML ~~LOC~~ SOPN: PEN_INJECTOR | SUBCUTANEOUS | 2 refills | Status: DC

## 2016-08-22 NOTE — Patient Instructions (Signed)
We have increased your Lantus to 64 units QHS.  I have started Liraglutide as another diabetes medication.  Please take 0.6 mg once daily for 1 week; then increase to 1.2 mg once daily.  Please return in 2-3 weeks for a follow up appointment.  We also gave you the Prevnar 13 and pneumonia vaccine today.

## 2016-08-22 NOTE — Assessment & Plan Note (Signed)
A/P: - we gave her the influenza vaccine and Prevar 13 vaccine today.  She will return in 1 year for her PPV 23.

## 2016-08-22 NOTE — Assessment & Plan Note (Signed)
Lab Results  Component Value Date   HGBA1C 10.6 08/22/2016   A: Patient reports blood sugars have been uncontrolled lately prompting her to schedule appointment.  She is due for A1C as well.  Her A1C has deteriorated on her current regimen of Levemir 58 units QHS and metformin 1000mg  BID.  It was 8.7 in March 2017 and is 10.6 today.  She reports checking her glucose daily in the morning and that it had been in the 150s but lately has been more high in the 200s.  She did not bring her meter with her today.  She believes that the Levemir is ineffective and would like to switch back to Lantus which she was on previously.  She reports trying to walk more and has lost some weight.  She avoids sugary drinks but reports to eating potatoes and other carbs on a daily basis.  P: - given her A1C deterioration and being well above her goal, we have increased her basal insulin by about 10% to 64 units QHS.  We have also switched her from Levemir to Lantus - continue metformin 1000mg  BID - as her glycemic control is not at goal with 2 agents, we will add a 3rd agent.  Patient prefers to avoid meal-time insulin and would like to consider other options at this point.  As a result, we have started liraglutide daily.  She will begin with 0.6mg  daily for 1 week then titrate up to 1.2mg  daily.  A GLP-1 agonist may be a better initial 3rd medication than SGLT-2 inhibitor due to weight loss benefit and she is already on Lasix. - Body mass index is 30.1 kg/m. We have discussed further weight loss and better dietary choices regarding her diabetes. - she will follow up in 2 weeks with her meter to assess her blood sugars and tolerance of new medications. - continue daily CBG monitoring for now.  If need for prandial insulin arises, we will begin checking more frequently.

## 2016-08-22 NOTE — Progress Notes (Signed)
Case discussed with Dr. Wallace soon after the resident saw the patient. We reviewed the resident's history and exam and pertinent patient test results. I agree with the assessment, diagnosis, and plan of care documented in the resident's note. 

## 2016-08-22 NOTE — Progress Notes (Signed)
CC: here for DM follow up.  HPI:  [name] is a 65 y.o. woman with a past medical history listed below here today for follow up of her diabetes.  For details of today's visit and the status of her chronic medical issues please refer to the assessment and plan.   Past Medical History:  Diagnosis Date  . Carpal tunnel syndrome   . Diabetes mellitus    Type II- diagnosed 30 yrs. ago  . Family history of adverse reaction to anesthesia    20 YRS. AGO, HER 19 YR. OLD NEPHEW, ENDED UP ON VENTILATOR- PT. THINKS ITS BECAUSE HE ASPIRATED   . Hypercalcemia   . Hyperlipidemia   . Hypertension   . Osteopenia   . Rotator cuff syndrome of right shoulder     Review of Systems:   Review of Systems  Constitutional: Negative for chills and fever.  Respiratory: Negative for cough and shortness of breath.   Cardiovascular: Negative for chest pain.  Endo/Heme/Allergies: Negative for polydipsia.     Physical Exam:  Vitals:   08/22/16 0949  BP: 109/66  Pulse: 96  Temp: 98.6 F (37 C)  TempSrc: Oral  SpO2: 100%  Weight: 169 lb 14.4 oz (77.1 kg)  Height: 5\' 3"  (1.6 m)   Physical Exam  Constitutional: She is oriented to person, place, and time and well-developed, well-nourished, and in no distress.  HENT:  Head: Normocephalic and atraumatic.  Cardiovascular: Normal rate and regular rhythm.   Pulmonary/Chest: Effort normal and breath sounds normal.  Neurological: She is alert and oriented to person, place, and time.  Skin: Skin is warm and dry.  Psychiatric: Mood and affect normal.    Assessment & Plan:   See Encounters Tab for problem based charting.  Patient discussed with Dr. Eppie Gibson.  Diabetes mellitus type 2, uncontrolled, without complications Lab Results  Component Value Date   HGBA1C 10.6 08/22/2016   A: Patient reports blood sugars have been uncontrolled lately prompting her to schedule appointment.  She is due for A1C as well.  Her A1C has deteriorated on  her current regimen of Levemir 58 units QHS and metformin 1000mg  BID.  It was 8.7 in March 2017 and is 10.6 today.  She reports checking her glucose daily in the morning and that it had been in the 150s but lately has been more high in the 200s.  She did not bring her meter with her today.  She believes that the Levemir is ineffective and would like to switch back to Lantus which she was on previously.  She reports trying to walk more and has lost some weight.  She avoids sugary drinks but reports to eating potatoes and other carbs on a daily basis.  P: - given her A1C deterioration and being well above her goal, we have increased her basal insulin by about 10% to 64 units QHS.  We have also switched her from Levemir to Lantus - continue metformin 1000mg  BID - as her glycemic control is not at goal with 2 agents, we will add a 3rd agent.  Patient prefers to avoid meal-time insulin and would like to consider other options at this point.  As a result, we have started liraglutide daily.  She will begin with 0.6mg  daily for 1 week then titrate up to 1.2mg  daily.  A GLP-1 agonist may be a better initial 3rd medication than SGLT-2 inhibitor due to weight loss benefit and she is already on Lasix. - Body mass index  is 30.1 kg/m. We have discussed further weight loss and better dietary choices regarding her diabetes. - she will follow up in 2 weeks with her meter to assess her blood sugars and tolerance of new medications. - continue daily CBG monitoring for now.  If need for prandial insulin arises, we will begin checking more frequently.  Healthcare maintenance A/P: - we gave her the influenza vaccine and Prevar 13 vaccine today.  She will return in 1 year for her PPV 23.

## 2016-08-28 ENCOUNTER — Telehealth: Payer: Self-pay | Admitting: *Deleted

## 2016-08-28 DIAGNOSIS — E1165 Type 2 diabetes mellitus with hyperglycemia: Principal | ICD-10-CM

## 2016-08-28 DIAGNOSIS — Z794 Long term (current) use of insulin: Principal | ICD-10-CM

## 2016-08-28 DIAGNOSIS — IMO0001 Reserved for inherently not codable concepts without codable children: Secondary | ICD-10-CM

## 2016-08-28 MED ORDER — INSULIN PEN NEEDLE 31G X 5 MM MISC: 1.0000 | Freq: Three times a day (TID) | 5 refills | Status: DC

## 2016-08-28 MED ORDER — LIRAGLUTIDE 18 MG/3ML ~~LOC~~ SOPN: PEN_INJECTOR | SUBCUTANEOUS | 2 refills | Status: DC

## 2016-08-28 MED ORDER — INSULIN GLARGINE 100 UNIT/ML SOLOSTAR PEN: 64.0000 [IU] | PEN_INJECTOR | Freq: Every day | SUBCUTANEOUS | 11 refills | Status: DC

## 2016-08-28 MED ORDER — ACCU-CHEK SOFT TOUCH LANCETS MISC: 5 refills | Status: DC

## 2016-08-28 MED ORDER — GLUCOSE BLOOD VI STRP: ORAL_STRIP | 6 refills | Status: DC

## 2016-08-28 MED FILL — ACCU-CHEK SOFTCLIX LANCETS: 50 days supply | Qty: 100 | Fill #0

## 2016-08-28 NOTE — Telephone Encounter (Signed)
Call from pt - states cannot afford Lantus ; cost is $364.00 with insurance. Prior Josem Kaufmann was done. Pt received letter that Lantuus available "at the amount listed in her plan". Pt states she has been off insulin x 1 week and today was the first day blood sugars have been over 200. She prefers to take pills and continue what's doing- exercising and watching he diet. Please send any new rx to [facility]. Thanks

## 2016-08-29 ENCOUNTER — Telehealth: Payer: Self-pay | Admitting: *Deleted

## 2016-08-29 ENCOUNTER — Other Ambulatory Visit: Payer: Self-pay | Admitting: Internal Medicine

## 2016-08-29 MED ORDER — GLIPIZIDE 5 MG PO TABS: 5.0000 mg | ORAL_TABLET | Freq: Two times a day (BID) | ORAL | 0 refills | Status: DC

## 2016-08-29 MED ORDER — INSULIN DETEMIR 100 UNIT/ML ~~LOC~~ SOLN: 64.0000 [IU] | Freq: Every day | SUBCUTANEOUS | 11 refills | Status: DC

## 2016-08-29 MED FILL — glipiZIDE 5 MG TABS: 5 | 30 days supply | Qty: 60 | Fill #0

## 2016-08-29 MED FILL — ACCU-CHEK AVIVA PLUS METER: W/DEVICE | 30 days supply | Qty: 1 | Fill #0

## 2016-08-29 MED FILL — ACCU-CHEK AVIVA PLUS TEST S: 25 days supply | Qty: 100 | Fill #0

## 2016-08-29 NOTE — Telephone Encounter (Signed)
Called pt - informed of Glipizide rx and to take w/meals; stated she will. Informed to stop and call for any low BS -voiced understanding. Pt has an appt already scheduled next Tues 9/26 in [facility] - pt instructed to keep this appt and to bring meter.

## 2016-08-29 NOTE — Telephone Encounter (Signed)
Per chart review pt was taken off glipizide 10mg  XL on 04/10/2012 2/2 morning hypoglycemia, this was while she was also on lantus 10 units at night. If she does not want to be on any insulin, thus will not be taking lantus, liraglutide, or levemir then it will be safe to start on glipizide 5mg  BID as long as she eats regular meals. She will need to follow up in clinic in 1 week with glucometer. If she notices hypoglycemia she will need to stop taking glipizide and call the clinic. I have sent in a RX to [facility] outpatient pharmacy for glipizide 5mg  BID. Thanks.   Julious Oka, MD Internal Medicine Resident, PGY [facility] Internal Medicine Program Pager: [phone]

## 2016-08-29 NOTE — Telephone Encounter (Addendum)
Spoke with patient and after explaining options and costs with her for diabetes medicine ( she can get medicine from [facility] while in the doughnut hole), she is set on using pills and no injectables and following a strict diet to control her blood sugars.  She scheduled an appointment with RD, CDE for 2 weeks to discuss and says she has already started. Suggested she may want to pursue getting approved to obtain her medicine through [facility] Medication program as Victoza can help with weight loss and lowering blood sugars. She agreed to discuss further at her appointment .   She would like to retry glipizide ER and asks that this prescription be sent to the pharmacy.

## 2016-08-29 NOTE — Telephone Encounter (Signed)
Had a call from pt - wanted to know if a pill for diabetes had been ordered. Informed her, Dr Hulen Luster had ordered only insulin inj. Pt stated she did not want any more inj only pills.  Called Dr Hulen Luster - informed her what pt had stated. Sent rx for Levemir to [facility]. I asked [name] Plyler to help w/pt's situation.   Received call from [place], stated pt is in the doughnut hole so will have to $129.00 for Levemir; $382.00 for Lantus. [name] P informed.

## 2016-09-04 ENCOUNTER — Telehealth: Payer: Self-pay | Admitting: Internal Medicine

## 2016-09-04 NOTE — Telephone Encounter (Signed)
APT. REMINDER CALL, LMTCB °

## 2016-09-05 ENCOUNTER — Ambulatory Visit: Payer: Commercial Managed Care - HMO

## 2016-09-12 ENCOUNTER — Encounter: Payer: Self-pay | Admitting: *Deleted

## 2016-09-13 ENCOUNTER — Ambulatory Visit: Payer: Commercial Managed Care - HMO | Admitting: Dietician

## 2016-09-14 ENCOUNTER — Ambulatory Visit (INDEPENDENT_AMBULATORY_CARE_PROVIDER_SITE_OTHER): Payer: Commercial Managed Care - HMO | Admitting: Dietician

## 2016-09-14 DIAGNOSIS — IMO0001 Reserved for inherently not codable concepts without codable children: Secondary | ICD-10-CM

## 2016-09-14 DIAGNOSIS — Z7984 Long term (current) use of oral hypoglycemic drugs: Secondary | ICD-10-CM

## 2016-09-14 DIAGNOSIS — E1165 Type 2 diabetes mellitus with hyperglycemia: Secondary | ICD-10-CM

## 2016-09-14 DIAGNOSIS — Z713 Dietary counseling and surveillance: Secondary | ICD-10-CM

## 2016-09-14 DIAGNOSIS — Z794 Long term (current) use of insulin: Principal | ICD-10-CM

## 2016-09-14 NOTE — Progress Notes (Signed)
  Medical Nutrition Therapy:  Appt start time: 1030 end time:  1130. Visit # 1  Assessment:  Primary concerns today: glycemic control  Patient is here for assistance with her blood sugar control. She reports more stress lately, having trouble affording her medications because she is in the Medicare doughnut hole and therefore went without medicine for several weeks. During this time her blood sugar became uncontrolled. She was put back on glipizide 5 mg twice daily and has been taking it faithfully for 2 weeks, walking 15 minutes a day 5 days a week and trying to lower her food.carb intake. Her blood sugar as today in the office is 235 fasting, this am before she got here it was 244 fasting, range at home over past 2 weeks has been 168- 270 fasting.  Preferred Learning Style:No preference indicated   Learning Readiness:  Ready and Change in progress  ANTHROPOMETRICS: weight-171.5#, BMI-30.4- obese class I WEIGHT HISTORY:stable, fluctuates between 171-180# for past 8 years SLEEP:not assessed today MEDICATIONS: metformin 100 mg twice dail and glipizide 5 mg twice daily BLOOD SUGAR:she did not bring her meter today DIETARY INTAKE: Usual eating pattern includes 2-3 meals and 1-2 snacks per day. 24-hr recall:  B ( 9-11 AM): coffee x2 with NNS, apple/peach or app.lesauce, egg or cereal banan and milk L ( 2 PM): leftovers from [employer]- shrimp and vegetables with noodles that she ate ~ 1/4 of and added more broccoli and onions D ( 430 PM): nachos and cheese , diet coke Snk ( 9 PM): 1/2 slice bread & cheese, diet coke Beverages: water between meals  Usual physical activity: walks some at work 4-9 PM , just started walking 15 minutes in the mornings daily  Estimated daily energy needs: 1200-1400 calories/day to reduce her with by ~ 1 # per week 130-150 g carbohydrates   Progress Towards Goal(s):  In progress.   Nutritional Diagnosis:  NB-1.1 Food and nutrition-related knowledge  deficit As related to lack of sufficient prior diabetes meal planning training.  As evidenced by her report sndf qustions and concerns demonstrating lack of self confidence and knowledge. .    Intervention:  Nutrition education about diabetes meal planning, self monitoing and medciation. . Coordination of care:Called and spoke with Dr.Truong who approved patient sample of lantus solostar ( she is in the doughnut hole and cost is unaffordable to her ) to start on 10 units lantus and increase by 2 units every 2 days until her blood sugars first thing in the morming are between 90 and 130. Patient was instructed to contact [facility] MAP to see if they can order her insulin. She agreed to follow up if unable to get insulin. She will need a prescription sent to them for lantus   Teaching Method Utilized: Visual,,Auditory,,Hands on Handouts given during visit include: Barriers to learning/adherence to lifestyle change: stress.competing values Demonstrated degree of understanding via:  Teach Back   Monitoring/Evaluation:  Dietary intake, exercise, meter, and body weight in 6 week(s).    patient was given a sample of lantus solostar per her doctor  today with instructions about how to use it.  Lot #  [id number] Exp. Date: 01/2019 Patient has been instructed regarding the correct time, dose, and frequency of taking this medication, including its desired effects and most common side effects.

## 2016-09-15 NOTE — Patient Instructions (Addendum)
Keep eating plenty of vegetables, healthy fats like olive oil, nuts, seeds, some fruits, increased fiber to help control your blood sugars.   Recommended amount of activity is 150 minutes a week- sop 30 minutes for 5 day OR 50 minutes for 3 days. Do not skip more than 2 days in a row or your blood sugar will begin to rise.   Increase the Lantus as we discussed. Be sure we know what you final dose is that holds your morning blood sugar between 90 -130 so you get the correct amount of inulin.   I asked Dr. Hulen Luster to send a prescription to the [employer] MAP program. I think they can help you get this medicine.  I suggest we follow up in about 6 weeks to see how you are doing with all of this.   Call anytime-   [name] [phone]

## 2016-09-25 ENCOUNTER — Other Ambulatory Visit: Payer: Self-pay | Admitting: *Deleted

## 2016-09-25 MED ORDER — GLIPIZIDE 5 MG PO TABS: 5.0000 mg | ORAL_TABLET | Freq: Two times a day (BID) | ORAL | 0 refills | Status: DC

## 2016-09-25 MED FILL — METOPROLOL SUCC ER 25 MG TA: 25 | 90 days supply | Qty: 90 | Fill #2

## 2016-09-25 MED FILL — LISINOPRIL 10 MG TABLET: 10 | 90 days supply | Qty: 90 | Fill #3

## 2016-09-25 MED FILL — glipiZIDE 5 MG TABS: 5 | 30 days supply | Qty: 60 | Fill #0

## 2016-10-02 ENCOUNTER — Other Ambulatory Visit: Payer: Self-pay

## 2016-10-02 NOTE — Telephone Encounter (Signed)
Requesting Lantus to be filled @ health department.

## 2016-10-02 NOTE — Telephone Encounter (Signed)
Have called [facility] and will call hd and pt tomorrow

## 2016-10-04 ENCOUNTER — Telehealth: Payer: Self-pay | Admitting: Dietician

## 2016-10-04 ENCOUNTER — Other Ambulatory Visit: Payer: Self-pay | Admitting: *Deleted

## 2016-10-04 DIAGNOSIS — E1165 Type 2 diabetes mellitus with hyperglycemia: Principal | ICD-10-CM

## 2016-10-04 DIAGNOSIS — Z794 Long term (current) use of insulin: Principal | ICD-10-CM

## 2016-10-04 DIAGNOSIS — IMO0001 Reserved for inherently not codable concepts without codable children: Secondary | ICD-10-CM

## 2016-10-04 MED ORDER — INSULIN GLARGINE 100 UNIT/ML SOLOSTAR PEN: 64.0000 [IU] | PEN_INJECTOR | Freq: Every day | SUBCUTANEOUS | 3 refills | Status: DC

## 2016-10-04 MED ORDER — INSULIN GLARGINE 100 UNIT/ML SOLOSTAR PEN: PEN_INJECTOR | SUBCUTANEOUS | 3 refills | Status: DC

## 2016-10-04 NOTE — Telephone Encounter (Signed)
Called new script to HD, used the one put in system by donnap. And approved by dr Hulen Luster

## 2016-10-04 NOTE — Telephone Encounter (Signed)
Received two messages while I Was out of the office: patient says the health department needs a prescription of lantus to order hers. She is using the health department because she is in th doughnut holeout of lantus as of yesterday.  She will need a sample today either from [place] or the health department.

## 2016-10-04 NOTE — Telephone Encounter (Signed)
Lot #  [id number] Exp. Date  01/2019 Patient has been instructed regarding the correct time, dose, and frequency of taking this medication, including its desired effects and most common side effects.  Sample signed out by attending after coordinating care with Medication assistance program and triage nurse.   Ms. Hornbaker informed if sample needed again to call the health department several days in advance if when she will be out.

## 2016-10-04 NOTE — Telephone Encounter (Signed)
Have sent new script to dr Randell Patient for approval and faxing to MAP

## 2016-10-12 MED FILL — metFORMIN HCL 1000 MG TABS: 1000 | 90 days supply | Qty: 180 | Fill #1

## 2016-10-23 ENCOUNTER — Other Ambulatory Visit: Payer: Self-pay | Admitting: Internal Medicine

## 2016-10-23 NOTE — Telephone Encounter (Signed)
Per chart review, she is supposed to be on lasix 20mg  daily only.  Can you call and ask her how she is taking her lasix?  Thanks

## 2016-10-25 MED FILL — FUROSEMIDE 40 MG TABLET: 40 | 90 days supply | Qty: 90 | Fill #0

## 2016-11-01 ENCOUNTER — Encounter: Payer: Commercial Managed Care - HMO | Admitting: Internal Medicine

## 2016-11-01 ENCOUNTER — Telehealth: Payer: Self-pay

## 2016-11-01 ENCOUNTER — Other Ambulatory Visit: Payer: Self-pay | Admitting: *Deleted

## 2016-11-01 DIAGNOSIS — IMO0001 Reserved for inherently not codable concepts without codable children: Secondary | ICD-10-CM

## 2016-11-01 DIAGNOSIS — E1165 Type 2 diabetes mellitus with hyperglycemia: Principal | ICD-10-CM

## 2016-11-01 MED ORDER — GLIPIZIDE 5 MG PO TABS: 5.0000 mg | ORAL_TABLET | Freq: Two times a day (BID) | ORAL | 0 refills | Status: DC

## 2016-11-01 MED FILL — glipiZIDE 5 MG TABS: 5 | 90 days supply | Qty: 180 | Fill #0

## 2016-11-01 NOTE — Telephone Encounter (Signed)
Please call pt  back regarding glipizide.

## 2016-11-01 NOTE — Telephone Encounter (Signed)
It appears the glipizide was started by Dr. Hulen Luster in mid-September.  She was supposed to be seen in 1 week but cancelled the appointment.  She is scheduled to see Dr. Hulen Luster in 8 days.  I have filled a 3 month prescription for glipizide, but have not given any refills to make sure we have a mechanism in place to assure this is the appropriate therapy for her.

## 2016-11-01 NOTE — Telephone Encounter (Signed)
Lm for rtc 

## 2016-11-09 ENCOUNTER — Encounter: Payer: Commercial Managed Care - HMO | Admitting: Internal Medicine

## 2016-11-13 ENCOUNTER — Ambulatory Visit: Payer: Commercial Managed Care - HMO | Admitting: Internal Medicine

## 2016-11-14 ENCOUNTER — Telehealth: Payer: Self-pay | Admitting: Internal Medicine

## 2016-11-14 NOTE — Telephone Encounter (Signed)
APT. REMINDER CALL, LMTCB °

## 2016-11-15 ENCOUNTER — Ambulatory Visit (INDEPENDENT_AMBULATORY_CARE_PROVIDER_SITE_OTHER): Payer: Commercial Managed Care - HMO | Admitting: Internal Medicine

## 2016-11-15 VITALS — BP 130/84 | HR 92 | Temp 98.3°F | Wt 173.0 lb

## 2016-11-15 DIAGNOSIS — E1165 Type 2 diabetes mellitus with hyperglycemia: Secondary | ICD-10-CM | POA: Diagnosis not present

## 2016-11-15 DIAGNOSIS — I10 Essential (primary) hypertension: Secondary | ICD-10-CM

## 2016-11-15 DIAGNOSIS — E785 Hyperlipidemia, unspecified: Secondary | ICD-10-CM | POA: Diagnosis not present

## 2016-11-15 DIAGNOSIS — Z794 Long term (current) use of insulin: Secondary | ICD-10-CM

## 2016-11-15 DIAGNOSIS — IMO0001 Reserved for inherently not codable concepts without codable children: Secondary | ICD-10-CM

## 2016-11-15 DIAGNOSIS — Z79899 Other long term (current) drug therapy: Secondary | ICD-10-CM

## 2016-11-15 LAB — GLUCOSE, CAPILLARY: Glucose-Capillary: 116 mg/dL — ABNORMAL HIGH (ref 65–99)

## 2016-11-15 LAB — POCT GLYCOSYLATED HEMOGLOBIN (HGB A1C): Hemoglobin A1C: 8.6

## 2016-11-15 MED ORDER — INSULIN GLARGINE 100 UNIT/ML SOLOSTAR PEN: 16.0000 [IU] | PEN_INJECTOR | Freq: Every day | SUBCUTANEOUS | 3 refills | Status: DC

## 2016-11-15 MED ORDER — ATORVASTATIN CALCIUM 40 MG PO TABS: 40.0000 mg | ORAL_TABLET | Freq: Every day | ORAL | 11 refills | Status: DC

## 2016-11-15 MED ORDER — INSULIN GLARGINE 100 UNIT/ML SOLOSTAR PEN: 20.0000 [IU] | PEN_INJECTOR | Freq: Every day | SUBCUTANEOUS | 3 refills | Status: DC

## 2016-11-15 MED ORDER — SITAGLIPTIN PHOSPHATE 100 MG PO TABS: 100.0000 mg | ORAL_TABLET | Freq: Every day | ORAL | 3 refills | Status: DC

## 2016-11-15 MED FILL — ATORVASTATIN 40 MG TABLET: 40 | 30 days supply | Qty: 30 | Fill #0

## 2016-11-15 NOTE — Assessment & Plan Note (Addendum)
She was previously on pravastatin but stopped taking it b/c she forgot. Will start on lipitor 40 due to ASCVD 10 year risk of 26.8% that was calculated last visit.

## 2016-11-15 NOTE — Progress Notes (Signed)
   CC: diabetes  HPI:  Michele Andrews is a 65 y.o. with past medical history as outlined below who presents to clinic for diabetes follow-up. Please see problem list for further details of patient's current medical issues.  Past Medical History:  Diagnosis Date  . Carpal tunnel syndrome   . Diabetes mellitus    Type II- diagnosed 30 yrs. ago  . Family history of adverse reaction to anesthesia    20 YRS. AGO, HER 20 YR. OLD NEPHEW, ENDED UP ON VENTILATOR- PT. THINKS ITS BECAUSE HE ASPIRATED   . Hypercalcemia   . Hyperlipidemia   . Hypertension   . Osteopenia   . Rotator cuff syndrome of right shoulder     Review of Systems:  Denies chest pain, n/v, and abd pain. She has polyuria and polydipsia but has intentionally tried to increase water intake.   Physical Exam:  Vitals:   11/15/16 1543  BP: 130/84  Pulse: 92  Temp: 98.3 F (36.8 C)  TempSrc: Oral  SpO2: 100%  Weight: 173 lb (78.5 kg)  Physical Exam  Constitutional: She is oriented to person, place, and time. She appears well-developed and well-nourished. No distress.  HENT:  Head: Normocephalic and atraumatic.  Nose: Nose normal.  Eyes: Conjunctivae and EOM are normal. No scleral icterus.  Cardiovascular: Normal rate and regular rhythm.  Exam reveals no gallop and no friction rub.   No murmur heard. Pulmonary/Chest: Effort normal and breath sounds normal. No respiratory distress. She has no wheezes. She has no rales.  Abdominal: Soft. Bowel sounds are normal. She exhibits no distension. There is no tenderness. There is no rebound.  Neurological: She is alert and oriented to person, place, and time.  Skin: Skin is warm and dry. No rash noted. She is not diaphoretic. No erythema. No pallor.     Assessment & Plan:   See Encounters Tab for problem based charting.  Patient discussed with Dr. Lynnae January

## 2016-11-15 NOTE — Assessment & Plan Note (Signed)
Assessment: blood pressure today is 130/84. On lisinopril 10mg , metoprolol 25mg , lasix 40mg  daily.   Plan: continue current regiment, checking BMET today.

## 2016-11-15 NOTE — Patient Instructions (Signed)
For your diabetes take:  1. Januvia 100mg  daily 2. Metformin 1000mg  twice a day 3. lantus 16 units at night 4. Glipizide 5 mg twice a day with meals.   Check your sugar once every morning prior to eating and bring your glucometer with you to your visit in one month.   Sign up for these programs:  1. [place] [facility] ($350) 2. [facility] to wellness ( diabetes medicine)

## 2016-11-15 NOTE — Assessment & Plan Note (Signed)
Lab Results  Component Value Date   HGBA1C 8.6 11/15/2016   HGBA1C 10.6 08/22/2016   HGBA1C 8.7 02/23/2016     Assessment: Diabetes control:  uncontrolled Progress toward A1C goal:   progressing towards goal Comments: on metformin 1000mg  BID, lantus 16 units qhs, and glipizide 5mg  BID. She did not bring her glucometer in, denies any readings below 70. Her highest reading is 202 and her morning fasting CBG is in the 120's.   Plan: Medications:  continue current medications, start on januvia 100mg  daily IOther plans: f/u in 1 month with glucometer report. If she does not have any low morning CBGs  < 120 then can increase evening lantus.

## 2016-11-16 LAB — BMP8+ANION GAP
ANION GAP: 19 mmol/L — AB (ref 10.0–18.0)
BUN/Creatinine Ratio: 14 (ref 12–28)
BUN: 10 mg/dL (ref 8–27)
CALCIUM: 10.1 mg/dL (ref 8.7–10.3)
CO2: 24 mmol/L (ref 18–29)
Chloride: 96 mmol/L (ref 96–106)
Creatinine, Ser: 0.73 mg/dL (ref 0.57–1.00)
GFR calc Af Amer: 99 mL/min/{1.73_m2} (ref >59)
GFR, EST NON AFRICAN AMERICAN: 86 mL/min/{1.73_m2} (ref >59)
Glucose: 129 mg/dL — ABNORMAL HIGH (ref 65–99)
Potassium: 4.3 mmol/L (ref 3.5–5.2)
Sodium: 139 mmol/L (ref 134–144)

## 2016-11-16 NOTE — Progress Notes (Signed)
Internal Medicine Clinic Attending  Case discussed with Dr. Truong at the time of the visit.  We reviewed the resident's history and exam and pertinent patient test results.  I agree with the assessment, diagnosis, and plan of care documented in the resident's note.  

## 2016-11-17 ENCOUNTER — Telehealth: Payer: Self-pay | Admitting: Internal Medicine

## 2016-11-17 NOTE — Telephone Encounter (Signed)
FYI.  Per patient she was unable to afford her new Diabetic Medication      sitaGLIPtin (JANUVIA) 100 MG tablet Take 1 tablet (100 mg total) by mouth daily  Patient requested to change medication from the [place] to the health Department.  After patient called the Health Department for the medication they told her it would be 7 weeks before they would get it in.  Patient states by that time she her insurance will kick in, therefore she will not start this medication until January.  Patient requested this to be communicated to you.  Please advise.

## 2016-11-20 NOTE — Telephone Encounter (Signed)
That should be fine. Can you please ensure she has an appt in clinic in one month. Thanks. Dr. Hulen Luster

## 2016-12-12 MED FILL — ATORVASTATIN 40 MG TABLET: 40 | 30 days supply | Qty: 30 | Fill #1

## 2016-12-12 MED FILL — IBUPROFEN 800 MG TABLET: 800 | 10 days supply | Qty: 30 | Fill #1

## 2016-12-25 MED FILL — JANUVIA 100 MG TABLET: 100 | 30 days supply | Qty: 30 | Fill #0

## 2016-12-25 MED FILL — METOPROLOL SUCC ER 25 MG TA: 25 | 90 days supply | Qty: 90 | Fill #3

## 2016-12-26 MED FILL — LANTUS SOLOSTAR 100 UNITS/M: 100 | 75 days supply | Qty: 15 | Fill #0

## 2017-01-09 ENCOUNTER — Other Ambulatory Visit: Payer: Self-pay | Admitting: Internal Medicine

## 2017-01-09 MED FILL — ATORVASTATIN 40 MG TABLET: 40 | 30 days supply | Qty: 30 | Fill #2

## 2017-01-09 MED FILL — metFORMIN HCL 1000 MG TABS: 1000 | 90 days supply | Qty: 180 | Fill #2

## 2017-01-10 ENCOUNTER — Other Ambulatory Visit: Payer: Self-pay | Admitting: *Deleted

## 2017-01-12 ENCOUNTER — Other Ambulatory Visit: Payer: Self-pay | Admitting: Internal Medicine

## 2017-01-12 MED FILL — LISINOPRIL 10 MG TABLET: 10 | 90 days supply | Qty: 90 | Fill #0

## 2017-01-12 NOTE — Telephone Encounter (Signed)
Please pt back today regarding her lisinopril (PRINIVIL,ZESTRIL) 10 MG tablet

## 2017-01-12 NOTE — Telephone Encounter (Signed)
Called pt to let her know Lisinopril has been refilled - no answer; left message.

## 2017-01-14 MED ORDER — LISINOPRIL 10 MG PO TABS: 10.0000 mg | ORAL_TABLET | Freq: Every day | ORAL | 4 refills | Status: DC

## 2017-01-24 MED FILL — JANUVIA 100 MG TABLET: 100 | 30 days supply | Qty: 30 | Fill #1

## 2017-02-05 ENCOUNTER — Other Ambulatory Visit: Payer: Self-pay

## 2017-02-05 DIAGNOSIS — IMO0001 Reserved for inherently not codable concepts without codable children: Secondary | ICD-10-CM

## 2017-02-05 DIAGNOSIS — E1165 Type 2 diabetes mellitus with hyperglycemia: Principal | ICD-10-CM

## 2017-02-05 MED ORDER — GLIPIZIDE 5 MG PO TABS: 5.0000 mg | ORAL_TABLET | Freq: Two times a day (BID) | ORAL | 0 refills | Status: DC

## 2017-02-05 MED FILL — glipiZIDE 5 MG TABS: 5 | 30 days supply | Qty: 60 | Fill #0

## 2017-02-05 NOTE — Telephone Encounter (Signed)
Requesting Glipizide to be filled. Please call pt back.

## 2017-02-12 MED FILL — ATORVASTATIN 40 MG TABLET: 40 | 30 days supply | Qty: 30 | Fill #3

## 2017-03-01 MED FILL — FUROSEMIDE 40 MG TABLET: 40 | 90 days supply | Qty: 90 | Fill #1

## 2017-03-05 MED FILL — glipiZIDE 5 MG TABS: 5 | 90 days supply | Qty: 180 | Fill #0

## 2017-03-05 MED FILL — JANUVIA 100 MG TABLET: 100 | 30 days supply | Qty: 30 | Fill #2

## 2017-03-06 ENCOUNTER — Other Ambulatory Visit: Payer: Self-pay | Admitting: Internal Medicine

## 2017-03-06 NOTE — Telephone Encounter (Signed)
glipiZIDE (GLUCOTROL) 5 MG tablet [facility]

## 2017-03-06 NOTE — Telephone Encounter (Signed)
Called pharmacy, they filled 3 month supply yesterday and it is waiting for pt to pick up, automated system did call her but donna at pharmacy will personally call her

## 2017-03-15 MED FILL — ATORVASTATIN 40 MG TABLET: 40 | 90 days supply | Qty: 90 | Fill #4

## 2017-03-15 MED FILL — LANTUS SOLOSTAR 100 UNITS/M: 100 | 75 days supply | Qty: 15 | Fill #1

## 2017-04-02 MED FILL — IBUPROFEN 800 MG TAB: 800 | 10 days supply | Qty: 30 | Fill #2

## 2017-04-04 MED FILL — JANUVIA 100 MG TABLET: 100 | 30 days supply | Qty: 30 | Fill #3

## 2017-04-12 MED FILL — metFORMIN HCL 1000 MG TABS: 1000 | 90 days supply | Qty: 180 | Fill #3

## 2017-04-20 ENCOUNTER — Other Ambulatory Visit: Payer: Self-pay | Admitting: Internal Medicine

## 2017-04-20 MED FILL — LISINOPRIL 10 MG TABLET: 10 | 90 days supply | Qty: 90 | Fill #0

## 2017-04-25 ENCOUNTER — Encounter: Payer: Self-pay | Admitting: Internal Medicine

## 2017-04-25 ENCOUNTER — Ambulatory Visit (INDEPENDENT_AMBULATORY_CARE_PROVIDER_SITE_OTHER): Payer: 59 | Admitting: Internal Medicine

## 2017-04-25 VITALS — BP 136/78 | HR 92 | Temp 98.4°F | Ht 64.5 in | Wt 177.2 lb

## 2017-04-25 DIAGNOSIS — Z794 Long term (current) use of insulin: Secondary | ICD-10-CM

## 2017-04-25 DIAGNOSIS — E118 Type 2 diabetes mellitus with unspecified complications: Secondary | ICD-10-CM

## 2017-04-25 DIAGNOSIS — Z79899 Other long term (current) drug therapy: Secondary | ICD-10-CM | POA: Diagnosis not present

## 2017-04-25 DIAGNOSIS — IMO0001 Reserved for inherently not codable concepts without codable children: Secondary | ICD-10-CM

## 2017-04-25 DIAGNOSIS — I10 Essential (primary) hypertension: Secondary | ICD-10-CM

## 2017-04-25 DIAGNOSIS — E1165 Type 2 diabetes mellitus with hyperglycemia: Principal | ICD-10-CM

## 2017-04-25 LAB — GLUCOSE, CAPILLARY: GLUCOSE-CAPILLARY: 101 mg/dL — AB (ref 65–99)

## 2017-04-25 LAB — POCT GLYCOSYLATED HEMOGLOBIN (HGB A1C): HEMOGLOBIN A1C: 7.8

## 2017-04-25 MED ORDER — LISINOPRIL 10 MG PO TABS: 10.0000 mg | ORAL_TABLET | Freq: Every day | ORAL | 0 refills | Status: DC

## 2017-04-25 MED ORDER — METOPROLOL SUCCINATE ER 25 MG PO TB24: 25.0000 mg | ORAL_TABLET | Freq: Every day | ORAL | 3 refills | Status: DC

## 2017-04-25 MED ORDER — METFORMIN HCL 1000 MG PO TABS: 1000.0000 mg | ORAL_TABLET | Freq: Two times a day (BID) | ORAL | 4 refills | Status: DC

## 2017-04-25 MED ORDER — INSULIN GLARGINE 100 UNIT/ML SOLOSTAR PEN: 20.0000 [IU] | PEN_INJECTOR | Freq: Every day | SUBCUTANEOUS | 3 refills | Status: DC

## 2017-04-25 MED ORDER — GLIPIZIDE 5 MG PO TABS: 5.0000 mg | ORAL_TABLET | Freq: Two times a day (BID) | ORAL | 0 refills | Status: DC

## 2017-04-25 MED FILL — METOPROLOL SUCC ER 25 MG TA: 25 | 90 days supply | Qty: 90 | Fill #0

## 2017-04-25 NOTE — Assessment & Plan Note (Signed)
Vitals:   04/25/17 1548  BP: 136/78  Pulse: 92  Temp: 98.4 F (36.9 C)   Well controlled on  on lisinopril 10mg  daily + metoprolol 25mg  + lasix 40mg  daily  Cont this

## 2017-04-25 NOTE — Progress Notes (Signed)
   CC: DM II f/up   HPI:  Michele Andrews is a 66 y.o. with PMh as listed below is here for DM II and HTN f/up   Past Medical History:  Diagnosis Date  . Carpal tunnel syndrome   . Diabetes mellitus    Type II- diagnosed 30 yrs. ago  . Family history of adverse reaction to anesthesia    20 YRS. AGO, HER 20 YR. OLD NEPHEW, ENDED UP ON VENTILATOR- PT. THINKS ITS BECAUSE HE ASPIRATED   . Hypercalcemia   . Hyperlipidemia   . Hypertension   . Osteopenia   . Rotator cuff syndrome of right shoulder      HTN - on lisinopril 10mg  daily + metoprolol 25mg  + lasix 40mg  daily  DM II - Last seen 11/2016, hgba1c 8.6 at that time,  on Januvia 100mg  daily (not taking it)  metformin 1000mg  bid + lantus 16 units qHS (went up to 20 units on her own 2 weeks ago) + glipizide 5 mg bid. Today hgba1c is 7.8. Knows what changes she needs to make but she has been eating lots bread and butter and sweets. Has not been doing any exercises. She states she will start changing her diet and start walking. Sugar readings at home ranging from 82-180 recently. No hypoglycemias.  Review of Systems:   Review of Systems  Constitutional: Negative for chills and fever.  Cardiovascular: Negative for chest pain and palpitations.  Gastrointestinal: Negative for heartburn, nausea and vomiting.  Genitourinary: Negative for dysuria.  Neurological: Negative for dizziness and headaches.     Physical Exam:  Vitals:   04/25/17 1548  BP: 136/78  Pulse: 92  Temp: 98.4 F (36.9 C)  TempSrc: Oral  SpO2: 100%  Weight: 177 lb 3.2 oz (80.4 kg)  Height: 5' 4.5" (1.638 m)   Physical Exam  Constitutional: She is oriented to person, place, and time. She appears well-developed and well-nourished.  HENT:  Head: Normocephalic and atraumatic.  Cardiovascular: Normal rate and regular rhythm.  Exam reveals no gallop and no friction rub.   No murmur heard. Respiratory: Effort normal and breath sounds normal. No respiratory  distress. She has no wheezes.  Musculoskeletal: Normal range of motion. She exhibits no edema.  Normal pulses. No skin lesions or wounds.   Neurological: She is alert and oriented to person, place, and time. No cranial nerve deficit.    Assessment & Plan:   See Encounters Tab for problem based charting.  Patient discussed with Dr. Daryll Drown

## 2017-04-25 NOTE — Patient Instructions (Signed)
Continue your current medications.  Make the lifestyle changes we talked about.   Follow up in 3 months.

## 2017-04-25 NOTE — Assessment & Plan Note (Signed)
Last seen 11/2016, hgba1c 8.6 at that time,  on Januvia 100mg  daily (not taking it)  metformin 1000mg  bid + lantus 16 units qHS (went up to 20 units on her own 2 weeks ago) + glipizide 5 mg bid. Today hgba1c is 7.8. Knows what changes she needs to make but she has been eating lots bread and butter and sweets. Has not been doing any exercises. She states she will start changing her diet and start walking. Sugar readings at home ranging from 82-180 recently. No hypoglycemias.  She is doing better, I think her hgba1c is reflecting the recent change of lantus she made on her own by going up to 20 units. I feel that her home sugar readings are in reasonable range. With the lifestyle changes which she is motivated to make I feel that she will continue to improve.  -continue current medications without changes: metformin 1000mg  bid, lantus 20 units + glipizide 5 mg bid - not taking [place] so will d/c it from her list - f/up in 3 months - has eye doctor appt

## 2017-04-26 NOTE — Progress Notes (Signed)
Internal Medicine Clinic Attending  Case discussed with Dr. Ahmed soon after the resident saw the patient.  We reviewed the resident's history and exam and pertinent patient test results.  I agree with the assessment, diagnosis, and plan of care documented in the resident's note. 

## 2017-04-26 NOTE — Addendum Note (Signed)
Addended by: Gilles Chiquito B on: 04/26/2017 10:17 AM   Modules accepted: Level of Service

## 2017-05-03 ENCOUNTER — Other Ambulatory Visit: Payer: Self-pay | Admitting: Internal Medicine

## 2017-05-03 DIAGNOSIS — IMO0001 Reserved for inherently not codable concepts without codable children: Secondary | ICD-10-CM

## 2017-05-03 DIAGNOSIS — E1165 Type 2 diabetes mellitus with hyperglycemia: Principal | ICD-10-CM

## 2017-05-03 NOTE — Telephone Encounter (Signed)
Refill Request  Pt wanting to know about her Januvia Medication.  Pt would like a call back.

## 2017-05-03 NOTE — Telephone Encounter (Signed)
NEEDS A REFILL ON HER INSULIN, NO REFILLS LEFT

## 2017-05-03 NOTE — Telephone Encounter (Signed)
Lantus solostar rx was sent to [facility] on 5/16. Called pt - no answer; left message on answering machine.

## 2017-05-04 NOTE — Telephone Encounter (Signed)
Spoke w/ pt and reminded her that [place] had been discontinued at previous visit

## 2017-05-04 NOTE — Telephone Encounter (Addendum)
Attempted to call pt, at her may visit she stated she had not been taking [place] and looking at refill hx that is true so it was removed from her medication regimen by dr Genene Churn 5/16 please see note, lm for rtc

## 2017-05-22 ENCOUNTER — Encounter: Payer: Self-pay | Admitting: *Deleted

## 2017-05-29 MED FILL — LANTUS SOLOSTAR 100 UNITS/M: 100 | 75 days supply | Qty: 15 | Fill #2

## 2017-05-31 ENCOUNTER — Other Ambulatory Visit: Payer: Self-pay | Admitting: *Deleted

## 2017-05-31 DIAGNOSIS — IMO0001 Reserved for inherently not codable concepts without codable children: Secondary | ICD-10-CM

## 2017-05-31 DIAGNOSIS — E1165 Type 2 diabetes mellitus with hyperglycemia: Principal | ICD-10-CM

## 2017-06-01 MED ORDER — SITAGLIPTIN PHOSPHATE 100 MG PO TABS: 100.0000 mg | ORAL_TABLET | Freq: Every day | ORAL | 3 refills | Status: DC

## 2017-06-01 MED FILL — JANUVIA 100 MG TABLET: 100 | 30 days supply | Qty: 30 | Fill #0

## 2017-06-01 NOTE — Telephone Encounter (Signed)
Needs appt mid aud DM F/U - PCP or [facility]

## 2017-06-07 MED FILL — FUROSEMIDE 40 MG TABLET: 40 | 90 days supply | Qty: 90 | Fill #2

## 2017-06-07 MED FILL — glipiZIDE 5 MG TABS: 5 | 90 days supply | Qty: 180 | Fill #0

## 2017-07-04 MED FILL — ATORVASTATIN 40 MG TABLET: 40 | 90 days supply | Qty: 90 | Fill #5

## 2017-07-04 MED FILL — IBUPROFEN 800 MG TAB: 800 | 10 days supply | Qty: 30 | Fill #3

## 2017-07-11 MED FILL — JANUVIA 100 MG TABLET: 100 | 30 days supply | Qty: 30 | Fill #1

## 2017-07-11 MED FILL — metFORMIN HCL 1000 MG TABS: 1000 | 90 days supply | Qty: 180 | Fill #0

## 2017-07-24 MED FILL — LISINOPRIL 10 MG TABLET: 10 | 90 days supply | Qty: 90 | Fill #0

## 2017-07-25 ENCOUNTER — Ambulatory Visit: Payer: Medicare Other | Admitting: Dietician

## 2017-07-25 ENCOUNTER — Encounter: Payer: Self-pay | Admitting: Internal Medicine

## 2017-07-25 ENCOUNTER — Ambulatory Visit (INDEPENDENT_AMBULATORY_CARE_PROVIDER_SITE_OTHER): Payer: 59 | Admitting: Internal Medicine

## 2017-07-25 VITALS — BP 113/68 | HR 84 | Temp 98.2°F | Ht 64.5 in | Wt 174.8 lb

## 2017-07-25 DIAGNOSIS — Z794 Long term (current) use of insulin: Secondary | ICD-10-CM | POA: Diagnosis not present

## 2017-07-25 DIAGNOSIS — E1165 Type 2 diabetes mellitus with hyperglycemia: Secondary | ICD-10-CM | POA: Diagnosis not present

## 2017-07-25 DIAGNOSIS — I10 Essential (primary) hypertension: Secondary | ICD-10-CM | POA: Diagnosis not present

## 2017-07-25 DIAGNOSIS — M25512 Pain in left shoulder: Secondary | ICD-10-CM | POA: Diagnosis not present

## 2017-07-25 DIAGNOSIS — Z79899 Other long term (current) drug therapy: Secondary | ICD-10-CM | POA: Diagnosis not present

## 2017-07-25 DIAGNOSIS — E118 Type 2 diabetes mellitus with unspecified complications: Secondary | ICD-10-CM | POA: Diagnosis not present

## 2017-07-25 DIAGNOSIS — Z0000 Encounter for general adult medical examination without abnormal findings: Secondary | ICD-10-CM

## 2017-07-25 DIAGNOSIS — IMO0001 Reserved for inherently not codable concepts without codable children: Secondary | ICD-10-CM

## 2017-07-25 DIAGNOSIS — M7989 Other specified soft tissue disorders: Secondary | ICD-10-CM | POA: Diagnosis not present

## 2017-07-25 LAB — POCT GLYCOSYLATED HEMOGLOBIN (HGB A1C): Hemoglobin A1C: 7.9

## 2017-07-25 LAB — GLUCOSE, CAPILLARY: Glucose-Capillary: 94 mg/dL (ref 65–99)

## 2017-07-25 MED ORDER — INSULIN PEN NEEDLE 31G X 5 MM MISC: 1.0000 | Freq: Three times a day (TID) | 5 refills | Status: DC

## 2017-07-25 MED ORDER — INSULIN GLARGINE 100 UNIT/ML SOLOSTAR PEN: 22.0000 [IU] | PEN_INJECTOR | Freq: Every day | SUBCUTANEOUS | 3 refills | Status: DC

## 2017-07-25 MED ORDER — GLUCOSE BLOOD VI STRP: ORAL_STRIP | 6 refills | Status: DC

## 2017-07-25 MED ORDER — ACCU-CHEK SOFT TOUCH LANCETS MISC: 5 refills | Status: DC

## 2017-07-25 MED ORDER — SITAGLIPTIN PHOSPHATE 100 MG PO TABS: 100.0000 mg | ORAL_TABLET | Freq: Every day | ORAL | 3 refills | Status: DC

## 2017-07-25 MED ORDER — LISINOPRIL 10 MG PO TABS: 10.0000 mg | ORAL_TABLET | Freq: Every day | ORAL | 0 refills | Status: DC

## 2017-07-25 MED ORDER — GLIPIZIDE 5 MG PO TABS
5.0000 mg | ORAL_TABLET | Freq: Two times a day (BID) | ORAL | 0 refills | Status: DC
Start: 2017-07-25 — End: 2017-11-23

## 2017-07-25 MED FILL — UNIFINE PENTIPS 31GX3/16: 31G X 5 MM | 30 days supply | Qty: 100 | Fill #0

## 2017-07-25 MED FILL — ACCU-CHEK GUIDE TEST STRIP: 25 days supply | Qty: 100 | Fill #0

## 2017-07-25 MED FILL — UNIFINE PENTIPS 31GX3/16": 31G X 5 MM | 30 days supply | Qty: 100 | Fill #0

## 2017-07-25 MED FILL — ACCU-CHEK FASTCLIX LANCETS: 51 days supply | Qty: 102 | Fill #0

## 2017-07-25 NOTE — Assessment & Plan Note (Signed)
Patient states she does not want to have a colonoscopy and would prefer to send in her stool for testing.   Plan: Ordered Fit test for the patient.

## 2017-07-25 NOTE — Progress Notes (Signed)
   CC: follow up for DMII and HTN  HPI:  [name] is a 66 y.o. with PMHx as noted below presenting for follow up for Type II DM and hypertension.   The patient also states that she had a mechanical fall one week ago where she fell on her left shoulder. She did not hit head or lose consciousness. She states her shoulder is still sore and she is taking Ibuprofen as needed for pain. She states the pain and her range of motion are slowly improving.   Past Medical History:  Diagnosis Date  . Carpal tunnel syndrome   . Diabetes mellitus    Type II- diagnosed 30 yrs. ago  . Family history of adverse reaction to anesthesia    20 YRS. AGO, HER 19 YR. OLD NEPHEW, ENDED UP ON VENTILATOR- PT. THINKS ITS BECAUSE HE ASPIRATED   . Hypercalcemia   . Hyperlipidemia   . Hypertension   . Osteopenia   . Rotator cuff syndrome of right shoulder    Review of Systems:   Review of Systems  Constitutional: Negative.  Negative for chills, fever and weight loss.  Respiratory: Negative for shortness of breath.   Cardiovascular: Positive for leg swelling. Negative for chest pain.  Gastrointestinal: Negative for abdominal pain, constipation, diarrhea, nausea and vomiting.  Genitourinary: Negative for dysuria, frequency, hematuria and urgency.  Musculoskeletal: Positive for joint pain (Left shoulder after fall).  Neurological: Negative.   Endo/Heme/Allergies: Negative.   Psychiatric/Behavioral: Negative.      Physical Exam:  Vitals:   07/25/17 1507  BP: 113/68  Pulse: 84  Temp: 98.2 F (36.8 C)  TempSrc: Oral  SpO2: 100%  Weight: 174 lb 12.8 oz (79.3 kg)  Height: 5' 4.5" (1.638 m)   Physical Exam  Constitutional: She is oriented to person, place, and time. She appears well-developed and well-nourished.  No acute distress.   HENT:  Head: Normocephalic and atraumatic.  Mouth/Throat: Oropharynx is clear and moist.  Eyes: Conjunctivae and EOM are normal.  Neck: Normal range of motion.  Neck supple. No thyromegaly present.  Cardiovascular: Normal rate, regular rhythm, normal heart sounds and intact distal pulses.   No R/M/G.   Pulmonary/Chest: Effort normal.  CTAB.   Abdominal: Soft. Bowel sounds are normal.  Non-distended, no TTP.  Musculoskeletal:       Right shoulder: Normal.       Left shoulder: She exhibits decreased range of motion (Decreased abduction, decreased internal rotation.). She exhibits no tenderness and no swelling.  Lymphadenopathy:    She has no cervical adenopathy.  Neurological: She is alert and oriented to person, place, and time.  Skin: Skin is warm, dry and intact.    Assessment & Plan:   See Encounters Tab for problem based charting.  Patient seen with Dr. Daryll Drown

## 2017-07-25 NOTE — Progress Notes (Signed)
Patient could not stay for her appointment and said she would reschedule.

## 2017-07-25 NOTE — Assessment & Plan Note (Signed)
BP today 113/68.   Assessment:  Blood pressure very well controlled on current regimen.   Plan:  Continue Metoprolol Succinate 25 mg, Lisinopril 10 mg, Furosemide 40 mg

## 2017-07-25 NOTE — Assessment & Plan Note (Addendum)
HPI:  Patient is in the Diabetes wellness program and states this helps her manage her diabetes. The patient monitors her BG twice daily, once prior to breakfast in the AM and once prior to dinner in the PM. She has had no symptoms of hypoglycemia. She states she is taking all of her medications as prescribed. She also states she had been trying to modify her diet to make healthier choices and decrease eating bread and sweets. She also has been monitoring her steps and trying to exercise more.   Assessment: A1C today 7.9 from 7.8 in May 2018, not well controlled with minimal improvement in A1C from last visit. BG report shows average BG of in AM 140s and BGs in PM 120s. Current medications include: Lantus 20 units, Januvia 100 mg, Metformin 1000 mg BID, Glipizide 5 mg BID.  Concern that BG monitoring isn't capturing elevated blood sugars post prandially because of minimal improvement in A1C with good BGs noted on meter log.   Plan:  Increase Lantus 22 units. Continue other medications as prescribed. Increase frequency of BG checks to three times daily. Alternate the timing of BG checks from before meals to after meals. BMET today to evaluate renal function.  Follow up in 3 months.

## 2017-07-25 NOTE — Assessment & Plan Note (Signed)
Assessment:  Left shoulder pain after mechanical fall, improving. Patient is slowly improving. No TTP on exam, but did have decreased range of motion with abduction (could not abduct over head) and decreased internal rotation. External rotation was normal.   Plan:  Discussed with the patient that imaging such as Xray was not necessary at this time and that injuries like this take 2-6 weeks to improve. Suggested she make an appointment in [facility] in one week. If she improves she can cancel the appointment. If she feels that her pain and ROM have not approved she can come to clinic and be evaluated.  -Continue Ibuprofen or Tylenol PRN as needed for pain

## 2017-07-25 NOTE — Patient Instructions (Addendum)
Ms. Helbert,   We have increased your Lantus to 22 units at bedtime. Please continue to take your other medications as prescribed.   Please check you blood sugar at different times during the day. Check them three times daily, either before or after meals.   For your shoulder pain, make an appointment in the Acute Care clinic in one week. If you symptoms improve you can cancel the appointment. If you still have pain and decreased mobility of your shoulder come to the appointment and we will evaluate you further.   Return in about 3 months (around 10/25/2017).

## 2017-07-26 ENCOUNTER — Other Ambulatory Visit: Payer: Self-pay | Admitting: *Deleted

## 2017-07-26 ENCOUNTER — Other Ambulatory Visit: Payer: 59

## 2017-07-26 ENCOUNTER — Other Ambulatory Visit: Payer: Self-pay | Admitting: Internal Medicine

## 2017-07-26 DIAGNOSIS — Z0000 Encounter for general adult medical examination without abnormal findings: Secondary | ICD-10-CM | POA: Diagnosis not present

## 2017-07-26 LAB — BMP8+ANION GAP
Anion Gap: 17 mmol/L (ref 10.0–18.0)
BUN/Creatinine Ratio: 15 (ref 12–28)
BUN: 11 mg/dL (ref 8–27)
CALCIUM: 10.1 mg/dL (ref 8.7–10.3)
CO2: 25 mmol/L (ref 20–29)
Chloride: 98 mmol/L (ref 96–106)
Creatinine, Ser: 0.73 mg/dL (ref 0.57–1.00)
GFR, EST AFRICAN AMERICAN: 99 mL/min/{1.73_m2} (ref >59)
GFR, EST NON AFRICAN AMERICAN: 85 mL/min/{1.73_m2} (ref >59)
Glucose: 100 mg/dL — ABNORMAL HIGH (ref 65–99)
Potassium: 4.2 mmol/L (ref 3.5–5.2)
Sodium: 140 mmol/L (ref 134–144)

## 2017-07-26 MED ORDER — GLUCOSE BLOOD VI STRP: ORAL_STRIP | 12 refills | Status: DC

## 2017-07-26 MED ORDER — GLUCOSE BLOOD VI STRP: ORAL_STRIP | 1 refills | Status: DC

## 2017-07-26 NOTE — Addendum Note (Signed)
Addended by: Gilles Chiquito B on: 07/26/2017 09:57 AM   Modules accepted: Level of Service

## 2017-07-26 NOTE — Progress Notes (Signed)
Internal Medicine Clinic Attending  I saw and evaluated the patient.  I personally confirmed the key portions of the history and exam documented by Dr. LaCroce and I reviewed pertinent patient test results.  The assessment, diagnosis, and plan were formulated together and I agree with the documentation in the resident's note.  

## 2017-07-27 MED FILL — LANTUS SOLOSTAR 100 UNITS/M: 100 | 68 days supply | Qty: 15 | Fill #0

## 2017-07-31 LAB — FECAL OCCULT BLOOD, IMMUNOCHEMICAL: Fecal Occult Bld: NEGATIVE

## 2017-07-31 MED FILL — METOPROLOL SUCC ER 25 MG TA: 25 | 90 days supply | Qty: 90 | Fill #1

## 2017-08-09 MED FILL — JANUVIA 100 MG TABLET: 100 | 30 days supply | Qty: 30 | Fill #2

## 2017-09-03 MED FILL — FUROSEMIDE 40 MG TABLET: 40 | 90 days supply | Qty: 90 | Fill #3

## 2017-09-03 MED FILL — JANUVIA 100 MG TABLET: 100 | 30 days supply | Qty: 30 | Fill #3

## 2017-09-03 MED FILL — glipiZIDE 5 MG TABS: 5 | 90 days supply | Qty: 180 | Fill #0

## 2017-09-07 ENCOUNTER — Ambulatory Visit (INDEPENDENT_AMBULATORY_CARE_PROVIDER_SITE_OTHER): Payer: 59 | Admitting: Internal Medicine

## 2017-09-07 VITALS — BP 144/76 | HR 80 | Temp 98.1°F | Ht 64.5 in | Wt 182.7 lb

## 2017-09-07 DIAGNOSIS — M25612 Stiffness of left shoulder, not elsewhere classified: Secondary | ICD-10-CM | POA: Diagnosis not present

## 2017-09-07 DIAGNOSIS — R531 Weakness: Secondary | ICD-10-CM

## 2017-09-07 DIAGNOSIS — G8911 Acute pain due to trauma: Secondary | ICD-10-CM

## 2017-09-07 DIAGNOSIS — Z9181 History of falling: Secondary | ICD-10-CM | POA: Diagnosis not present

## 2017-09-07 DIAGNOSIS — M25512 Pain in left shoulder: Secondary | ICD-10-CM

## 2017-09-07 DIAGNOSIS — M1712 Unilateral primary osteoarthritis, left knee: Secondary | ICD-10-CM

## 2017-09-07 MED ORDER — IBUPROFEN 800 MG PO TABS
800.0000 mg | ORAL_TABLET | Freq: Three times a day (TID) | ORAL | 1 refills | Status: DC | PRN
Start: 2017-09-07 — End: 2017-10-11

## 2017-09-07 MED ORDER — DICLOFENAC SODIUM 1 % TD GEL: 2.0000 g | Freq: Four times a day (QID) | TRANSDERMAL | 3 refills | Status: DC

## 2017-09-07 MED FILL — DICLOFENAC SODIUM 1% GEL: 1 | 13 days supply | Qty: 100 | Fill #0

## 2017-09-07 MED FILL — IBUPROFEN 800 MG TABS: 800 | 10 days supply | Qty: 30 | Fill #0

## 2017-09-07 NOTE — Assessment & Plan Note (Addendum)
Patient has worsening physical exam findings since last visit with decreased range of motion with internal and external rotation, as well as lateral and anterior abduction. She also has positive neers and empty can test. Her new symptom of throbbing pain at night is also concerning for rotator cuff tear or tendonopathy. Sharp pain elicited by movement is also consistent with nerve impingement.Physical examination only causes pain and not weakness, which is reassuring. No tenderness to palpation in joint spaces or bony architecture, which makes bursitis less likely. It is difficult to narrow down the cause of her pain because she has signs an symptoms of both rotator cuff tear and nerve impingement.   Plan: -MRI left shoulder -Ibuprofen 800 mg AM and PM as needed for pain  -Voltaren gel PRN for pain -Amb referal to sports medicine

## 2017-09-07 NOTE — Progress Notes (Signed)
   CC: left shoulder pain  HPI:  [name] is a 66 y.o. female with past medical history as documented below presenting with left shoulder pain. I saw the patient in clinic about 1.5 months ago for a similar complaint. She had fallen directly on that shoulder several weeks prior. At that time,  conservative treatment with ibuprofen was suggested and no imaging was performed. The patient states that her pain has gotten worse and her left shoulder looks swollen. She states that now the pain is waking her up at night, which she describes as a throbbing pain in her posterior lateral shoulder and upper arm. She will also have a sharp pain in her posterior lateral shoulder and upper arm when trying to get dressed or move her left arm overhead. She denies numbness or tingling, but does endorse intermittent weakness in her hand. She takes ibuprofen for the pain, which helps alleviate the pain. Denies any more falls or recent trauma to the left shoulder.   Past Medical History:  Diagnosis Date  . Carpal tunnel syndrome   . Diabetes mellitus    Type II- diagnosed 30 yrs. ago  . Family history of adverse reaction to anesthesia    20 YRS. AGO, HER 19 YR. OLD NEPHEW, ENDED UP ON VENTILATOR- PT. THINKS ITS BECAUSE HE ASPIRATED   . Hypercalcemia   . Hyperlipidemia   . Hypertension   . Osteopenia   . Rotator cuff syndrome of right shoulder    Review of Systems:   Review of Systems  Constitutional: Negative for chills and fever.  Respiratory: Negative for shortness of breath.   Cardiovascular: Negative for chest pain.  Musculoskeletal: Positive for joint pain. Negative for back pain, falls and neck pain.    Physical Exam:  Vitals:   09/07/17 1504  BP: (!) 144/76  Pulse: 80  Temp: 98.1 F (36.7 C)  TempSrc: Oral  SpO2: 100%  Weight: 182 lb 11.2 oz (82.9 kg)  Height: 5' 4.5" (1.638 m)   Physical Exam  Constitutional: She is oriented to person, place, and time. She appears  well-developed and well-nourished. No distress.  HENT:  Head: Normocephalic and atraumatic.  Neck: Normal range of motion.  Cardiovascular: Normal rate, regular rhythm and normal heart sounds.   Pulmonary/Chest: Effort normal and breath sounds normal.  Musculoskeletal:       Right shoulder: Normal.       Left shoulder: She exhibits normal range of motion, no tenderness, no bony tenderness, no swelling, no effusion and normal strength.  Left shoulder:  Mildly more swollen in comparison to the right shoulder. No erythema or warmth noted.  No tenderness to palpation around bony structures or joint spaces.  Decreased active and passive range of motion to internal and external rotation, as well as abduction. Can not lift arm above level of shoulder due to pain. Negative drop arm test, pain elicited on empty can test. Pain elicited with neers test.  Strength 5/5 in upper extremities bilaterally.  No sensory deficits in upper extremities  Neurological: She is alert and oriented to person, place, and time. No cranial nerve deficit.    Assessment & Plan:   See Encounters Tab for problem based charting.  Patient seen with Dr. Rebeca Alert

## 2017-09-07 NOTE — Patient Instructions (Addendum)
Ms. Harbeck,  I am sorry your shoulder pain has not improved.   I have put in the order for an MRI.  I have refilled you Ibuprofen. Take 1 800 mg tablet every 8 hours for pain. I would suggest taking one in the morning and one prior to bedtime. Take this with food.   I have also sent in Voltaren gel. Apply up to 4 times daily for shoulder pain.

## 2017-09-11 NOTE — Progress Notes (Signed)
Internal Medicine Clinic Attending  I saw and evaluated the patient.  I personally confirmed the key portions of the history and exam documented by Dr. Aggie Hacker and I reviewed pertinent patient test results.  The assessment, diagnosis, and plan were formulated together and I agree with the documentation in the resident's note.  Unfortunately, she has persistent shoulder pain despite conservative therapy. On exam today, she has positive empty can test and Neer's test. Range of motion is significantly limited both actively and passively due to pain. The primary differential is shoulder impingement syndrome versus rotator cuff tendinitis. She does not have significant amounts of weakness to suggest rotator cuff tear. We'll pursue MRI to evaluate this further and refer her to sports medicine clinic for further evaluation.  Oda Kilts, MD

## 2017-09-21 MED FILL — ACCU-CHEK GUIDE TEST STRIP: 25 days supply | Qty: 100 | Fill #1

## 2017-09-21 MED FILL — ACCU-CHEK FASTCLIX LANCETS: 51 days supply | Qty: 102 | Fill #1

## 2017-09-24 ENCOUNTER — Ambulatory Visit (HOSPITAL_COMMUNITY)
Admission: RE | Admit: 2017-09-24 | Discharge: 2017-09-24 | Disposition: A | Discharge status: Home / Self Care | Payer: 59 | Source: Ambulatory Visit | Attending: Student in an Organized Health Care Education/Training Program | Admitting: Student in an Organized Health Care Education/Training Program

## 2017-09-24 DIAGNOSIS — M25512 Pain in left shoulder: Secondary | ICD-10-CM | POA: Diagnosis not present

## 2017-09-24 DIAGNOSIS — M75102 Unspecified rotator cuff tear or rupture of left shoulder, not specified as traumatic: Secondary | ICD-10-CM | POA: Insufficient documentation

## 2017-09-24 DIAGNOSIS — M7552 Bursitis of left shoulder: Secondary | ICD-10-CM | POA: Diagnosis not present

## 2017-09-24 DIAGNOSIS — M129 Arthropathy, unspecified: Secondary | ICD-10-CM | POA: Diagnosis not present

## 2017-09-26 ENCOUNTER — Telehealth: Payer: Self-pay | Admitting: *Deleted

## 2017-09-26 NOTE — Telephone Encounter (Signed)
SPOKE WITH PATIENT, SHE STATES SHE LVM FOR SPORTS MEDICINE TO RETURN HER CALL TO SET UP APPOINTMENT.

## 2017-09-27 ENCOUNTER — Other Ambulatory Visit: Payer: Self-pay | Admitting: Internal Medicine

## 2017-10-03 MED FILL — LANTUS SOLOSTAR 100 UNITS/M: 100 | 68 days supply | Qty: 15 | Fill #1

## 2017-10-09 MED FILL — metFORMIN HCL 1000 MG TABS: 1000 | 90 days supply | Qty: 180 | Fill #1

## 2017-10-09 MED FILL — JANUVIA 100 MG TABLET: 100 | 90 days supply | Qty: 90 | Fill #4

## 2017-10-10 ENCOUNTER — Encounter: Payer: Self-pay | Admitting: Family Medicine

## 2017-10-10 ENCOUNTER — Ambulatory Visit (INDEPENDENT_AMBULATORY_CARE_PROVIDER_SITE_OTHER): Payer: 59 | Admitting: Family Medicine

## 2017-10-10 ENCOUNTER — Other Ambulatory Visit: Payer: Self-pay | Admitting: Internal Medicine

## 2017-10-10 DIAGNOSIS — Z1231 Encounter for screening mammogram for malignant neoplasm of breast: Secondary | ICD-10-CM

## 2017-10-10 DIAGNOSIS — M19012 Primary osteoarthritis, left shoulder: Secondary | ICD-10-CM

## 2017-10-10 DIAGNOSIS — M19019 Primary osteoarthritis, unspecified shoulder: Secondary | ICD-10-CM | POA: Insufficient documentation

## 2017-10-10 MED ORDER — METHYLPREDNISOLONE ACETATE 40 MG/ML IJ SUSP
40.0000 mg | Freq: Once | INTRAMUSCULAR | Status: AC
  Administered 2017-10-10: 40 mg via INTRA_ARTICULAR

## 2017-10-10 NOTE — Assessment & Plan Note (Signed)
Patient is presenting with signs and symptoms consistent with AC joint osteoarthritis with some associated supraspinatus irritation.  Recent MRI shows osteophyte development along the inferior aspect of the [facility] joint with some irritation along the supraspinatus.  Patient also has some impingement syndrome.  All of these are contributing to patient's discomfort. -AC joint injection provided today.  Patient tolerated very well. -We had a very long discussion about patient's MRI results.  I informed her that at this time she is a good candidate for likely a distal clavicle resection and possible acromioplasty.  All of this is in part to the fact that patient only has minimal wear to the supraspinatus without significant tearing.  Patient was adamant that she did not desire surgical intervention. -I have informed patient that if she does not have significant pain relief with AC joint injection today she would likely not improve with any repeated injections, and the next step would be surgical consultation.  Patient voiced her understanding. -Continue Voltaren gel as needed -Patient to follow-up as needed.

## 2017-10-10 NOTE — Progress Notes (Signed)
HPI  CC: Left shoulder pain Patient is presenting with left-sided shoulder pain over the past 3 months.  She states that she is been dealing with gradually worsening left-sided shoulder pain during this time and has not been able to determine the cause.  She denies any injury, trauma, or fall which may have initiated this discomfort.  She denies any prior injuries to this shoulder.  She states that the pain is located along the superior aspect of her left shoulder.  Pain does not radiate down the arm.  Pain is described as achy in nature.  It is worse with shoulder movement and across the body activities.  No significant issues with overhead activities.  Denies weakness, numbness, or paresthesias.  Patient was provided a prescription for Voltaren gel which she states helps but she is not been good about using this regularly.  Traumatic: No  Location: Superior aspect of the left shoulder Quality: Achy  Duration: Approximately 3 months Timing: Worse with crossarm activities  Improving/Worsening: Worsening gradually Makes better: Rest and anti-inflammatories Makes worse: Repeated use Associated symptoms: None  Previous Interventions Tried: Voltaren gel  Past Injuries: None Past Surgeries: None on the affected shoulder or arm Smoking: None Family Hx: Noncontributory  ROS: Per HPI; in addition no fever, no rash, no additional weakness, no additional numbness, no additional paresthesias, and no additional falls/injury.   Objective: BP 128/71   Ht 5\' 4"  (1.626 m)   Wt 176 lb (79.8 kg)   BMI 30.21 kg/m  Gen: NAD, well groomed, a/o x3, normal affect.  CV: Well-perfused. Warm.  Resp: Non-labored.  Neuro: Sensation intact throughout. No gross coordination deficits.  Gait: Nonpathologic posture, unremarkable stride without signs of limp or balance issues. Shoulder, Left: TTP noted at the superior aspect of the shoulder, specifically over the [facility] joint. No evidence of bony deformity,  asymmetry, or muscle atrophy; No tenderness over long head of biceps (bicipital groove). Full active and passive range of motion (180 flex Huel Cote /150Abd /90ER /70IR). Strength 5/5 throughout. No abnormal scapular function observed. Sensation intact. Peripheral pulses intact.  Special Tests:   - Crossarm test: Positive   - Empty can: Very mildly positive   - Hawkins: Positive   - Obrien's test: Very mildly positive   - Yergason's: NEG   - Speeds test: NEG  INJECTION: Left AC Joint Patient was given informed consent, signed copy in the chart. Appropriate time out was taken. Area prepped and draped in usual sterile fashion. 1 cc of methylprednisolone 40 mg/ml plus  1 cc of 1% lidocaine without epinephrine was injected into the [facility] joint using a(n) superior approach. The patient tolerated the procedure well. There were no complications. Post procedure instructions were given.   Assessment and Plan:  Acromioclavicular joint arthritis Patient is presenting with signs and symptoms consistent with AC joint osteoarthritis with some associated supraspinatus irritation.  Recent MRI shows osteophyte development along the inferior aspect of the [facility] joint with some irritation along the supraspinatus.  Patient also has some impingement syndrome.  All of these are contributing to patient's discomfort. -AC joint injection provided today.  Patient tolerated very well. -We had a very long discussion about patient's MRI results.  I informed her that at this time she is a good candidate for likely a distal clavicle resection and possible acromioplasty.  All of this is in part to the fact that patient only has minimal wear to the supraspinatus without significant tearing.  Patient was adamant that she did not  desire surgical intervention. -I have informed patient that if she does not have significant pain relief with AC joint injection today she would likely not improve with any repeated injections, and the next  step would be surgical consultation.  Patient voiced her understanding. -Continue Voltaren gel as needed -Patient to follow-up as needed.   Meds ordered this encounter  Medications  . methylPREDNISolone acetate (DEPO-MEDROL) injection 40 mg     Elberta Leatherwood, MD,MS [facility] 10/10/2017 5:26 PM

## 2017-10-11 ENCOUNTER — Encounter: Payer: Self-pay | Admitting: Internal Medicine

## 2017-10-11 ENCOUNTER — Ambulatory Visit (INDEPENDENT_AMBULATORY_CARE_PROVIDER_SITE_OTHER): Payer: 59 | Admitting: Internal Medicine

## 2017-10-11 VITALS — BP 123/66 | HR 84 | Temp 98.2°F | Wt 176.5 lb

## 2017-10-11 DIAGNOSIS — M25512 Pain in left shoulder: Secondary | ICD-10-CM

## 2017-10-11 DIAGNOSIS — Z803 Family history of malignant neoplasm of breast: Secondary | ICD-10-CM | POA: Diagnosis not present

## 2017-10-11 DIAGNOSIS — Z90710 Acquired absence of both cervix and uterus: Secondary | ICD-10-CM

## 2017-10-11 DIAGNOSIS — Z86018 Personal history of other benign neoplasm: Secondary | ICD-10-CM | POA: Diagnosis not present

## 2017-10-11 DIAGNOSIS — N644 Mastodynia: Secondary | ICD-10-CM | POA: Diagnosis not present

## 2017-10-11 MED ORDER — IBUPROFEN 800 MG PO TABS: 800.0000 mg | ORAL_TABLET | Freq: Three times a day (TID) | ORAL | 0 refills | Status: DC | PRN

## 2017-10-11 MED FILL — IBUPROFEN 800 MG TABS: 800 | 10 days supply | Qty: 30 | Fill #0

## 2017-10-11 NOTE — Patient Instructions (Addendum)
With regard to the right sided chest and breast pain we would like for you to follow-up with mammogram and ultrasound (ordered) of the right breast.  The pain is most likely secondary to musculoskeletal strain from increased use as you have been favoring the left arm recently.  I would advise you to take ibuprofen 800 mg up to 3 times daily as needed for pain for 2 more than 10 days.  If your symptoms fail to resolve in 2 weeks please contact our clinic or sooner if they worsen.  Thank you for your visit to the [facility].

## 2017-10-11 NOTE — Progress Notes (Signed)
   CC: right breast pain  HPI:  Michele Andrews is a 66 y.o. female who presents for one-week history of right-sided breast pain associated with tenderness and potential swelling.  She states that she has a family history of breast cancer and had a cyst removed from her left breast at the age of 47.  She states that the cyst was benign.  In addition the patient advises of a history of total hysterectomy in 1992.  Since that time she has maintain routine mammogram evaluations with the most recent being in April 2016.  She is due for a mammogram on the 27th of this month but would like a referral for ultrasound of the right breast if appropriate as well.  In addition the patient endorses left AC joint pain which received a corticosteroid injection the previous day.  As a result of this left shoulder pain she has been favoring that arm and utilizing her right arm to her greater extent.  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  She denies fever, chills, headache, palpitations, left-sided chest pain, right-sided shoulder pain, abdominal pain, nausea, vomiting, cough or leg pain.  Past Medical History:  Diagnosis Date  . Carpal tunnel syndrome   . Diabetes mellitus    Type II- diagnosed 30 yrs. ago  . Family history of adverse reaction to anesthesia    20 YRS. AGO, HER 19 YR. OLD NEPHEW, ENDED UP ON VENTILATOR- PT. THINKS ITS BECAUSE HE ASPIRATED   . Hypercalcemia   . Hyperlipidemia   . Hypertension   . Osteopenia   . Rotator cuff syndrome of right shoulder    Review of Systems: ROS negative except as per HPI.  Physical Exam:  Vitals:   10/11/17 1453  BP: 123/66  Pulse: 84  Temp: 98.2 F (36.8 C)  TempSrc: Oral  SpO2: 100%  Weight: 176 lb 8 oz (80.1 kg)   Physical Exam  Constitutional: She is oriented to person, place, and time. She appears well-nourished. No distress.    Cardiovascular: Normal rate and regular rhythm.   Pulmonary/Chest: Effort normal. No respiratory distress.  Abdominal: Soft. Bowel sounds are normal. There is no tenderness.  Musculoskeletal: She exhibits tenderness. She exhibits no edema or deformity.  There is tenderness to deep palpation of the right serratus anterior muscles posterior to the right breast tissue.  There is no palpable mass, cyst, lymph node, or other structure that is tender in the right breast.  Neurological: She is alert and oriented to person, place, and time.  Skin: Skin is warm. Capillary refill takes less than 2 seconds. No rash noted. No erythema.  In the areas are in the right breast axilla right shoulder and abdomen  Psychiatric: She has a normal mood and affect.    Assessment & Plan:   See Encounters Tab for problem based charting.  Patient seen with Dr. Evette Doffing

## 2017-10-11 NOTE — Assessment & Plan Note (Addendum)
A: Patient is most likely suffering from muscular skeletal pain.  See HPI for details However, given her family history of breast cancer and previous history of left cyst further evaluation is warranted.  P: Recommended that the patient continue with her plans for mammogram later this month. Ordered referral for ultrasound of the right breast complete given her one-week history of swelling and tenderness.  Although this pain most likely is musculoskeletal, more concerning etiology should be considered. Prescribed ibuprofen 100 mg 3 times daily no more than 10 days for muscular skeletal pain.  Advised patient return in 1 week if her symptoms do not resolve.

## 2017-10-12 MED FILL — DICLOFENAC SODIUM 1% GEL: 1 | 13 days supply | Qty: 100 | Fill #1

## 2017-10-12 NOTE — Progress Notes (Signed)
Internal Medicine Clinic Attending  I saw and evaluated the patient.  I personally confirmed the key portions of the history and exam documented by Dr. Harbrecht and I reviewed pertinent patient test results.  The assessment, diagnosis, and plan were formulated together and I agree with the documentation in the resident's note.  

## 2017-10-24 MED FILL — LISINOPRIL 10 MG TABS: 10 | 90 days supply | Qty: 90 | Fill #0

## 2017-10-26 MED FILL — METOPROLOL SUCC ER 25 MG TA: 25 | 90 days supply | Qty: 90 | Fill #2

## 2017-11-06 ENCOUNTER — Ambulatory Visit
Admission: RE | Admit: 2017-11-06 | Discharge: 2017-11-06 | Disposition: A | Discharge status: Home / Self Care | Payer: 59 | Source: Ambulatory Visit | Attending: Family Medicine | Admitting: Family Medicine

## 2017-11-06 DIAGNOSIS — Z1231 Encounter for screening mammogram for malignant neoplasm of breast: Secondary | ICD-10-CM

## 2017-11-23 ENCOUNTER — Other Ambulatory Visit: Payer: Self-pay | Admitting: Internal Medicine

## 2017-11-23 DIAGNOSIS — E1165 Type 2 diabetes mellitus with hyperglycemia: Principal | ICD-10-CM

## 2017-11-23 DIAGNOSIS — IMO0001 Reserved for inherently not codable concepts without codable children: Secondary | ICD-10-CM

## 2017-11-26 MED FILL — glipiZIDE 5 MG TABS: 5 | 90 days supply | Qty: 180 | Fill #0

## 2017-11-26 MED FILL — DICLOFENAC SODIUM 1% GEL: 1 | 13 days supply | Qty: 100 | Fill #2

## 2017-11-29 ENCOUNTER — Other Ambulatory Visit: Payer: Self-pay | Admitting: Oncology

## 2017-11-29 MED FILL — FUROSEMIDE 40 MG TAB: 40 | 90 days supply | Qty: 90 | Fill #0

## 2017-11-30 MED FILL — ACCU-CHEK GUIDE TEST STRIP: 25 days supply | Qty: 100 | Fill #2

## 2017-11-30 MED FILL — IBUPROFEN 800 MG TABS: 800 | 10 days supply | Qty: 30 | Fill #1

## 2017-11-30 MED FILL — ACCU-CHEK FASTCLIX LANCETS: 51 days supply | Qty: 102 | Fill #2

## 2017-11-30 MED FILL — LANTUS SOLOSTAR 100 UNITS/M: 100 | 68 days supply | Qty: 15 | Fill #2

## 2017-12-06 MED FILL — DICLOFENAC SODIUM 1% GEL: 1 | 13 days supply | Qty: 100 | Fill #3

## 2018-01-08 MED FILL — metFORMIN HCL 1000 MG TABS: 1000 | 90 days supply | Qty: 180 | Fill #2

## 2018-01-11 MED FILL — JANUVIA 100 MG TABLET: 100 | 90 days supply | Qty: 90 | Fill #5

## 2018-01-17 ENCOUNTER — Encounter: Payer: Self-pay | Admitting: Gastroenterology

## 2018-01-22 ENCOUNTER — Other Ambulatory Visit: Payer: Self-pay | Admitting: Internal Medicine

## 2018-01-22 DIAGNOSIS — I10 Essential (primary) hypertension: Secondary | ICD-10-CM

## 2018-01-22 MED FILL — METOPROLOL SUCCINATE ER 25: 25 | 90 days supply | Qty: 90 | Fill #3

## 2018-01-22 MED FILL — LISINOPRIL 10 MG TABS: 10 | 90 days supply | Qty: 90 | Fill #0

## 2018-02-04 MED FILL — LANTUS SOLOSTAR 100 UNITS/M: 100 | 68 days supply | Qty: 15 | Fill #3

## 2018-02-08 MED FILL — ACCU-CHEK GUIDE TEST STRIP: 25 days supply | Qty: 100 | Fill #3

## 2018-02-08 MED FILL — ACCU-CHEK FASTCLIX LANCETS: 51 days supply | Qty: 102 | Fill #3

## 2018-02-11 ENCOUNTER — Other Ambulatory Visit: Payer: Self-pay | Admitting: Internal Medicine

## 2018-02-11 DIAGNOSIS — M1712 Unilateral primary osteoarthritis, left knee: Secondary | ICD-10-CM

## 2018-02-11 MED FILL — glipiZIDE 5 MG TABS: 5 | 45 days supply | Qty: 90 | Fill #1

## 2018-02-12 MED FILL — DICLOFENAC SODIUM 1% GEL: 1 | 13 days supply | Qty: 100 | Fill #0

## 2018-02-25 MED FILL — FUROSEMIDE 40 MG TAB: 40 | 90 days supply | Qty: 90 | Fill #1

## 2018-03-20 ENCOUNTER — Encounter: Payer: 59 | Admitting: Internal Medicine

## 2018-03-22 ENCOUNTER — Encounter: Payer: Self-pay | Admitting: Internal Medicine

## 2018-03-22 ENCOUNTER — Ambulatory Visit (INDEPENDENT_AMBULATORY_CARE_PROVIDER_SITE_OTHER): Payer: 59 | Admitting: Internal Medicine

## 2018-03-22 ENCOUNTER — Other Ambulatory Visit: Payer: Self-pay

## 2018-03-22 ENCOUNTER — Other Ambulatory Visit: Payer: Self-pay | Admitting: *Deleted

## 2018-03-22 VITALS — BP 124/75 | HR 90 | Temp 98.5°F | Ht 64.0 in | Wt 180.7 lb

## 2018-03-22 DIAGNOSIS — E119 Type 2 diabetes mellitus without complications: Secondary | ICD-10-CM

## 2018-03-22 DIAGNOSIS — M25571 Pain in right ankle and joints of right foot: Secondary | ICD-10-CM

## 2018-03-22 DIAGNOSIS — IMO0001 Reserved for inherently not codable concepts without codable children: Secondary | ICD-10-CM

## 2018-03-22 DIAGNOSIS — I10 Essential (primary) hypertension: Secondary | ICD-10-CM | POA: Diagnosis not present

## 2018-03-22 DIAGNOSIS — Z1159 Encounter for screening for other viral diseases: Secondary | ICD-10-CM | POA: Diagnosis not present

## 2018-03-22 DIAGNOSIS — M25512 Pain in left shoulder: Secondary | ICD-10-CM

## 2018-03-22 DIAGNOSIS — E785 Hyperlipidemia, unspecified: Secondary | ICD-10-CM | POA: Diagnosis not present

## 2018-03-22 DIAGNOSIS — Z23 Encounter for immunization: Secondary | ICD-10-CM | POA: Diagnosis not present

## 2018-03-22 DIAGNOSIS — Z79899 Other long term (current) drug therapy: Secondary | ICD-10-CM

## 2018-03-22 DIAGNOSIS — Z794 Long term (current) use of insulin: Secondary | ICD-10-CM

## 2018-03-22 DIAGNOSIS — Z0000 Encounter for general adult medical examination without abnormal findings: Secondary | ICD-10-CM

## 2018-03-22 DIAGNOSIS — E1165 Type 2 diabetes mellitus with hyperglycemia: Principal | ICD-10-CM

## 2018-03-22 DIAGNOSIS — M79671 Pain in right foot: Secondary | ICD-10-CM

## 2018-03-22 LAB — POCT GLYCOSYLATED HEMOGLOBIN (HGB A1C): Hemoglobin A1C: 7.6

## 2018-03-22 LAB — GLUCOSE, CAPILLARY: Glucose-Capillary: 86 mg/dL (ref 65–99)

## 2018-03-22 MED ORDER — IBUPROFEN 800 MG PO TABS: 800.0000 mg | ORAL_TABLET | Freq: Three times a day (TID) | ORAL | 1 refills | Status: DC | PRN

## 2018-03-22 MED FILL — IBUPROFEN 800 MG TAB: 800 | 10 days supply | Qty: 30 | Fill #0

## 2018-03-22 NOTE — Assessment & Plan Note (Addendum)
BP Readings from Last 3 Encounters:  03/22/18 124/75  10/11/17 123/66  10/10/17 128/71   BP well controlled today on current regimen. Current regimen includes Toprol 25 mg daily, lisinopril 10 mg daily, and lasix 40 mg daily.   Plan: -Continue Toprol 25 mg daily, lisinopril 10 mg daily, and lasix 40 mg daily

## 2018-03-22 NOTE — Assessment & Plan Note (Addendum)
Fecal occult blood testing negative.  Hep C screening today. Received 2nd dose of Pneumovax today.  Patient to schedule ophthalmology appointment for yearly diabetic eye examination.

## 2018-03-22 NOTE — Assessment & Plan Note (Signed)
Patient's pain improved. She was evaluated by Sports Medicine and received an injection, which almost resolved her pain. She is to return to sports medicine on an as needed basis. No shoulder pain today.

## 2018-03-22 NOTE — Assessment & Plan Note (Signed)
Patient states she has been experiencing right heal pain. This started one month ago, no inciting event or trauma. She does a lot of walking at her job, and notices the pain is worse after being on her feet all day. Pain is also worse with weight bearing. The pain is located on the plantar heal surface, as well as the lateral and medial aspects of her foot. She has already started taking ibuprofen, resting her foot when she can, and soaking her feet. She states these interventions have already started to improved the pain. No swelling or deformity on examination today. ROM intact. No TTP. Mild pain elicited with passive dorsiflexion.  A/P: Patient likely experiencing tendonitis or plantar fascitis. Physical exam reassuring. Her pain is already improved on the proper treatment plan for tendonitis and plantar fascitis.   -Continue Ibuprofen 800 mg TID PRN, refilled today -Continue to rest, ice, and soak foot as needed -Discussed with patient that if her pain does not resolved or improve within another month to return to clinic or sports medicine for further evaluation  -Provided patient with info about stretching exercises

## 2018-03-22 NOTE — Assessment & Plan Note (Addendum)
Consider lipid profile next visit. Patient currently on Lipitor 40 mg. No clear guidelines if it is beneficial to continue yearly lipid screening in patient's currently on statin therapy. This patient may benefit if her LDL and cholesterol levels remain elevated and requires an increased Lipitor dose.

## 2018-03-22 NOTE — Progress Notes (Signed)
   CC: type 2 DM, HTN  HPI:  [name] is a 67 y.o. female with PMH as documented below presenting for follow up of her type 2 DM and hypertension. Please see encounter based charting for a detailed description of the patient's acute and chronic medical problems.   Past Medical History:  Diagnosis Date  . Carpal tunnel syndrome   . Diabetes mellitus    Type II- diagnosed 30 yrs. ago  . Family history of adverse reaction to anesthesia    20 YRS. AGO, HER 20 YR. OLD NEPHEW, ENDED UP ON VENTILATOR- PT. THINKS ITS BECAUSE HE ASPIRATED   . Hypercalcemia   . Hyperlipidemia   . Hypertension   . Osteopenia   . Rotator cuff syndrome of right shoulder    Review of Systems:   Review of Systems  Constitutional: Negative for chills and fever.  HENT: Negative.   Eyes: Negative.   Respiratory: Negative for shortness of breath.   Cardiovascular: Negative for chest pain and leg swelling.  Gastrointestinal: Negative for diarrhea, nausea and vomiting.  Musculoskeletal: Positive for joint pain (heal). Negative for falls.  Neurological: Negative.   Psychiatric/Behavioral: Negative.     Physical Exam:  Vitals:   03/22/18 1524  BP: 124/75  Pulse: 90  Temp: 98.5 F (36.9 C)  TempSrc: Oral  SpO2: 100%  Weight: 180 lb 11.2 oz (82 kg)  Height: 5\' 4"  (1.626 m)   Physical Exam  Constitutional: She is oriented to person, place, and time. She appears well-developed and well-nourished. No distress.  HENT:  Head: Normocephalic and atraumatic.  Eyes: Conjunctivae are normal. No scleral icterus.  Neck: Normal range of motion. Neck supple. No thyromegaly present.  Cardiovascular: Normal rate, regular rhythm and normal heart sounds.  Pulmonary/Chest: Effort normal and breath sounds normal.  Abdominal: Soft. Bowel sounds are normal. There is no tenderness.  Musculoskeletal: Normal range of motion. She exhibits no edema.       Right foot: There is normal range of motion, no tenderness, no  bony tenderness, no swelling and no deformity.  Minimal pain elicited with dorsiflexion  Neurological: She is alert and oriented to person, place, and time. No cranial nerve deficit.  Skin: Skin is warm and dry.    Assessment & Plan:   See Encounters Tab for problem based charting.  Patient discussed with Dr. Rebeca Alert

## 2018-03-22 NOTE — Patient Instructions (Addendum)
Michele Andrews,   It was nice seeing you today. I am happy your shoulder has improved!  Continue to take all medications as prescribed.   Your blood pressure looks great today!  I think your right heal pain will continue to improve. It sounds like you may have plantars fasciitis which is inflammation in part of your foot called plantars fascia. You are currently doing all the right things for your foot pain. Continue to take ibuprofen 800 mg three times daily as need for pain. Rest the foot when you can and START ICING the areas that are the most painful.  Your A1C today is 7.6, which is improved from 7.9 at your last visit. Continue to take your medications as prescribed, I have not changed anything. Decreased your bread intake as bread has a lot of SUGAR in it! I would recommend trying to cut your weekly bread intake in half.   Return to clinic in 3 months to recheck your A1C and check your blood pressure.

## 2018-03-22 NOTE — Assessment & Plan Note (Signed)
Hemoglobin A1C 7.6 today from 7.9 in 07/2017. Patient's current regimen includes Lantus 22 units, Januvia 100 mg, Metformin 1000 mg BID, Glipizide 5 mg BID. She checks her blood glucose at home.BG average 144. Majority of AM blood glucose levels are within goal, with several >180.  She states she had one episode of PM hypoglycemia one month ago, BG 54. She felt off so checked her BG. She ate peanut butter or drank juice and this improved her BG. She admits to eating too much bread and is motivated to decrease the amount of bread she eats. She eats bread daily.   Plan: -continue current diabetes medication regimen, no changes today -Counseled patient on decreased amount of bread she is eating because bread contains high amounts of sugar, patient is motivated to change her diet and understands  -Encouraged patient to continue BG monitoring  -RTC 3 months for repeat A1C

## 2018-03-23 LAB — HEPATITIS C ANTIBODY: Hep C Virus Ab: <0.1 s/co ratio (ref 0.0–0.9)

## 2018-03-30 NOTE — Progress Notes (Signed)
Internal Medicine Clinic Attending  Case discussed with Dr. LaCroce  at the time of the visit.  We reviewed the resident's history and exam and pertinent patient test results.  I agree with the assessment, diagnosis, and plan of care documented in the resident's note.  Alexander N Raines, MD   

## 2018-04-01 ENCOUNTER — Other Ambulatory Visit: Payer: Self-pay | Admitting: Internal Medicine

## 2018-04-01 DIAGNOSIS — E1165 Type 2 diabetes mellitus with hyperglycemia: Principal | ICD-10-CM

## 2018-04-01 DIAGNOSIS — IMO0001 Reserved for inherently not codable concepts without codable children: Secondary | ICD-10-CM

## 2018-04-01 MED FILL — ACCU-CHEK GUIDE TEST STRIP: 25 days supply | Qty: 100 | Fill #4

## 2018-04-02 MED FILL — glipiZIDE 5 MG TABS: 5 | 45 days supply | Qty: 90 | Fill #0

## 2018-04-09 ENCOUNTER — Other Ambulatory Visit: Payer: Self-pay | Admitting: Internal Medicine

## 2018-04-09 DIAGNOSIS — E1165 Type 2 diabetes mellitus with hyperglycemia: Principal | ICD-10-CM

## 2018-04-09 DIAGNOSIS — IMO0001 Reserved for inherently not codable concepts without codable children: Secondary | ICD-10-CM

## 2018-04-09 MED FILL — JANUVIA 100 MG TABLET: 100 | 60 days supply | Qty: 60 | Fill #6

## 2018-04-09 MED FILL — metFORMIN HCL 1000 MG TABS: 1000 | 90 days supply | Qty: 180 | Fill #3

## 2018-04-10 MED FILL — LANTUS SOLOSTAR 100 UNITS/M: 100 | 68 days supply | Qty: 15 | Fill #0

## 2018-04-22 ENCOUNTER — Other Ambulatory Visit: Payer: Self-pay | Admitting: Internal Medicine

## 2018-04-22 DIAGNOSIS — I10 Essential (primary) hypertension: Secondary | ICD-10-CM

## 2018-04-23 ENCOUNTER — Other Ambulatory Visit: Payer: Self-pay | Admitting: *Deleted

## 2018-04-23 DIAGNOSIS — I10 Essential (primary) hypertension: Secondary | ICD-10-CM

## 2018-04-23 MED ORDER — METOPROLOL SUCCINATE ER 25 MG PO TB24: 25.0000 mg | ORAL_TABLET | Freq: Every day | ORAL | 1 refills | Status: DC

## 2018-04-23 MED FILL — LISINOPRIL 10 MG TABLET: 10 | 90 days supply | Qty: 90 | Fill #0

## 2018-04-24 MED FILL — METOPROLOL SUCCINATE ER 25: 25 | 90 days supply | Qty: 90 | Fill #0

## 2018-05-14 MED FILL — ACCU-CHEK GUIDE TEST STRIP: 25 days supply | Qty: 100 | Fill #5

## 2018-05-14 MED FILL — ACCU-CHEK FASTCLIX LANCETS: 51 days supply | Qty: 102 | Fill #4

## 2018-05-15 MED FILL — glipiZIDE 5 MG TABS: 5 | 45 days supply | Qty: 90 | Fill #1

## 2018-05-22 MED FILL — DICLOFENAC SODIUM 1% GEL: 1 | 13 days supply | Qty: 100 | Fill #1

## 2018-05-28 MED FILL — FUROSEMIDE 40 MG TAB: 40 | 90 days supply | Qty: 90 | Fill #2

## 2018-06-12 MED FILL — LANTUS SOLOSTAR 100 UNITS/M: 100 | 68 days supply | Qty: 15 | Fill #1

## 2018-06-12 MED FILL — JANUVIA 100 MG TABLET: 100 | 90 days supply | Qty: 90 | Fill #0

## 2018-07-01 ENCOUNTER — Encounter: Payer: Self-pay | Admitting: *Deleted

## 2018-07-02 MED FILL — glipiZIDE 5 MG TABS: 5 | 45 days supply | Qty: 90 | Fill #2

## 2018-07-05 MED FILL — ACCU-CHEK GUIDE STRP: 25 days supply | Qty: 100 | Fill #6

## 2018-07-15 MED FILL — LISINOPRIL 10 MG TABLET: 10 | 90 days supply | Qty: 90 | Fill #1

## 2018-07-17 ENCOUNTER — Telehealth: Payer: Self-pay

## 2018-07-17 ENCOUNTER — Other Ambulatory Visit: Payer: Self-pay | Admitting: *Deleted

## 2018-07-17 DIAGNOSIS — E1165 Type 2 diabetes mellitus with hyperglycemia: Principal | ICD-10-CM

## 2018-07-17 DIAGNOSIS — IMO0001 Reserved for inherently not codable concepts without codable children: Secondary | ICD-10-CM

## 2018-07-17 NOTE — Telephone Encounter (Signed)
Called pt, sent request to dr Sharon Seller, she states fasting BS this am 177 usually 120's, states she feels no adverse effects.

## 2018-07-17 NOTE — Telephone Encounter (Signed)
metFORMIN (GLUCOPHAGE) 1000 MG tablet, refill request @ outpatient pharmacy. Pt states her blood sugar is high, requesting to speak with a nurse. Please call pt back.

## 2018-07-18 ENCOUNTER — Other Ambulatory Visit: Payer: Self-pay | Admitting: *Deleted

## 2018-07-18 ENCOUNTER — Other Ambulatory Visit: Payer: Self-pay | Admitting: Internal Medicine

## 2018-07-18 DIAGNOSIS — E1165 Type 2 diabetes mellitus with hyperglycemia: Principal | ICD-10-CM

## 2018-07-18 DIAGNOSIS — IMO0001 Reserved for inherently not codable concepts without codable children: Secondary | ICD-10-CM

## 2018-07-18 MED ORDER — METFORMIN HCL 1000 MG PO TABS: 1000.0000 mg | ORAL_TABLET | Freq: Two times a day (BID) | ORAL | 4 refills | Status: DC

## 2018-07-18 MED FILL — metFORMIN HCL 1000 MG TABS: 1000 | 90 days supply | Qty: 180 | Fill #0

## 2018-07-18 NOTE — Telephone Encounter (Signed)
Pt came to clinic asking about Metformin refill. Informed request was sent to her doctor yesterday and doctors are given at least 48 hrs to refill. Stated she has been out of med for almost a week. But I sent text page to Dr Sharon Seller who refilled her mediation promptly.  And I called the pt about her refill.

## 2018-07-29 MED FILL — METOPROLOL SUCCINATE ER 25: 25 | 90 days supply | Qty: 90 | Fill #1

## 2018-08-14 ENCOUNTER — Other Ambulatory Visit: Payer: Self-pay | Admitting: Internal Medicine

## 2018-08-14 DIAGNOSIS — E1165 Type 2 diabetes mellitus with hyperglycemia: Principal | ICD-10-CM

## 2018-08-14 DIAGNOSIS — IMO0001 Reserved for inherently not codable concepts without codable children: Secondary | ICD-10-CM

## 2018-08-15 ENCOUNTER — Other Ambulatory Visit: Payer: Self-pay | Admitting: *Deleted

## 2018-08-15 DIAGNOSIS — IMO0001 Reserved for inherently not codable concepts without codable children: Secondary | ICD-10-CM

## 2018-08-15 DIAGNOSIS — E1165 Type 2 diabetes mellitus with hyperglycemia: Principal | ICD-10-CM

## 2018-08-15 MED FILL — LANTUS SOLOSTAR 100 UNITS/M: 100 | 68 days supply | Qty: 15 | Fill #2

## 2018-08-15 NOTE — Telephone Encounter (Signed)
Pt states the pharmacy is still waiting for the doctor to reply back on glipiZIDE (GLUCOTROL) 5 MG tablet. Please call pt back. Pt is using [facility].

## 2018-08-16 MED FILL — glipiZIDE 5 MG TABS: 5 | 90 days supply | Qty: 180 | Fill #0

## 2018-08-26 ENCOUNTER — Other Ambulatory Visit: Payer: Self-pay | Admitting: Internal Medicine

## 2018-08-27 MED FILL — ACCU-CHEK GUIDE STRP: 75 days supply | Qty: 300 | Fill #0

## 2018-08-27 MED FILL — FUROSEMIDE 40 MG TAB: 40 | 90 days supply | Qty: 90 | Fill #3

## 2018-08-28 ENCOUNTER — Emergency Department (HOSPITAL_COMMUNITY)
Admission: EM | Admit: 2018-08-28 | Discharge: 2018-08-28 | Disposition: A | Discharge status: Home / Self Care | Payer: 59 | Attending: Emergency Medicine | Admitting: Emergency Medicine

## 2018-08-28 ENCOUNTER — Encounter (HOSPITAL_COMMUNITY): Payer: Self-pay

## 2018-08-28 DIAGNOSIS — R000 Tachycardia, unspecified: Secondary | ICD-10-CM | POA: Diagnosis not present

## 2018-08-28 DIAGNOSIS — Z7984 Long term (current) use of oral hypoglycemic drugs: Secondary | ICD-10-CM | POA: Diagnosis not present

## 2018-08-28 DIAGNOSIS — M542 Cervicalgia: Secondary | ICD-10-CM | POA: Diagnosis not present

## 2018-08-28 DIAGNOSIS — E119 Type 2 diabetes mellitus without complications: Secondary | ICD-10-CM | POA: Diagnosis not present

## 2018-08-28 DIAGNOSIS — I10 Essential (primary) hypertension: Secondary | ICD-10-CM | POA: Insufficient documentation

## 2018-08-28 DIAGNOSIS — E213 Hyperparathyroidism, unspecified: Secondary | ICD-10-CM | POA: Insufficient documentation

## 2018-08-28 DIAGNOSIS — Z79899 Other long term (current) drug therapy: Secondary | ICD-10-CM | POA: Insufficient documentation

## 2018-08-28 DIAGNOSIS — R52 Pain, unspecified: Secondary | ICD-10-CM | POA: Diagnosis not present

## 2018-08-28 DIAGNOSIS — M62838 Other muscle spasm: Secondary | ICD-10-CM | POA: Insufficient documentation

## 2018-08-28 MED ORDER — DIAZEPAM 5 MG PO TABS
5.0000 mg | ORAL_TABLET | Freq: Once | ORAL | Status: AC
  Administered 2018-08-28: 5 mg via ORAL
  Filled 2018-08-28: qty 1

## 2018-08-28 MED ORDER — DIAZEPAM 2 MG PO TABS: 2.0000 mg | ORAL_TABLET | Freq: Two times a day (BID) | ORAL | 0 refills | Status: DC | PRN

## 2018-08-28 MED ORDER — NAPROXEN 500 MG PO TABS: 500.0000 mg | ORAL_TABLET | Freq: Two times a day (BID) | ORAL | 0 refills | Status: DC

## 2018-08-28 MED ORDER — KETOROLAC TROMETHAMINE 60 MG/2ML IM SOLN
60.0000 mg | Freq: Once | INTRAMUSCULAR | Status: AC
  Administered 2018-08-28: 60 mg via INTRAMUSCULAR
  Filled 2018-08-28: qty 2

## 2018-08-28 MED FILL — NAPROXEN 500 MG TABLET: 500 | 7 days supply | Qty: 14 | Fill #0

## 2018-08-28 MED FILL — diazePAM 2 MG TABS: 2 | 6 days supply | Qty: 12 | Fill #0

## 2018-08-28 NOTE — ED Provider Notes (Signed)
[facility] EMERGENCY DEPARTMENT Provider Note   CSN: [id number] Arrival date & time: 08/28/18  0053     History   Chief Complaint Chief Complaint  Patient presents with  . Neck Pain    HPI Michele Andrews is a 67 y.o. female.  The history is provided by the patient and medical records.  Neck Pain       67 year old female with history of carpal tunnel, diabetes, hypercalcemia, hyperlipidemia, hypertension, presenting to the ED with neck pain.  States when she woke up this morning she felt like her neck was "stiff" almost like there was a "crick" in it.  States she tried using some Tylenol and warm compresses at home with some mild relief.  She went to bed and woke up around 1130 with worsening pain in her neck.  States pain is worse with attempting to turn her head side to side.  She denies any injury/fall.  No numbness or weakness of her arms.  No bowel or bladder incontinence.  She denies any chest pain or shortness of breath.  Heat packs were applied here on arrival and reports it is helping with her pain.  Past Medical History:  Diagnosis Date  . Carpal tunnel syndrome   . Diabetes mellitus    Type II- diagnosed 30 yrs. ago  . Family history of adverse reaction to anesthesia    20 YRS. AGO, HER 19 YR. OLD NEPHEW, ENDED UP ON VENTILATOR- PT. THINKS ITS BECAUSE HE ASPIRATED   . Hypercalcemia   . Hyperlipidemia   . Hypertension   . Osteopenia   . Rotator cuff syndrome of right shoulder     Patient Active Problem List   Diagnosis Date Noted  . Pain in right foot 03/22/2018  . Breast pain, right 10/11/2017  . Acromioclavicular joint arthritis 10/10/2017  . Left shoulder pain 07/25/2017  . Osteoarthritis of left knee 02/23/2016  . Primary hyperparathyroidism ([place]) 11/08/2015  . Sinus tachycardia 07/28/2015  . Vitamin D insufficiency 03/10/2015  . Osteopenia 08/03/2014  . GERD (gastroesophageal reflux disease) 03/04/2014  . Preventative health care  01/06/2012  . Hyperparathyroidism ([place]) 11/02/2008  . Hyperlipemia 11/22/2006  . Essential hypertension 11/22/2006  . Diabetes mellitus type 2, uncontrolled, without complications ([place]) 16/94/5038    Past Surgical History:  Procedure Laterality Date  . ABDOMINAL HYSTERECTOMY    . BREAST CYST EXCISION Left   . BREAST SURGERY Left 1963   cyst removed   . PARATHYROIDECTOMY Right 11/08/2015  . PARATHYROIDECTOMY N/A 11/08/2015   Procedure: RIGHT INFERIOR PARATHYROIDECTOMY;  Surgeon: Armandina Gemma, MD;  Location: [facility];  Service: General;  Laterality: N/A;     OB History   None      Home Medications    Prior to Admission medications   Medication Sig Start Date End Date Taking? Authorizing Provider  ACCU-CHEK GUIDE test strip USE 3 TO 4 TIMES DAILY TO CHECK BLOOD SUGAR 08/27/18   Seawell, Jaimie A, DO  atorvastatin (LIPITOR) 40 MG tablet Take 1 tablet (40 mg total) by mouth daily. 11/15/16 11/15/17  Norman Herrlich, MD  Blood Glucose Monitoring Suppl [facility] BLOOD GLUCOSE) W/DEVICE KIT Use to check blood sugar 2 times a day     [provider]  cholecalciferol (VITAMIN D) 1000 UNITS tablet Take 2,000 Units by mouth daily.    [provider]  diclofenac sodium (VOLTAREN) 1 % GEL APPLY 2 GRAMS TOPICALLY 4 TIMES DAILY. 02/12/18   Lacroce, Hulen Shouts, MD  furosemide (LASIX)  40 MG tablet TAKE 1 TABLET BY MOUTH DAILY. 11/29/17   Lacroce, Hulen Shouts, MD  glipiZIDE (GLUCOTROL) 5 MG tablet TAKE 1 TABLET BY MOUTH 2 TIMES DAILY BEFORE A MEAL 08/15/18   Seawell, Jaimie A, DO  ibuprofen (ADVIL,MOTRIN) 800 MG tablet Take 1 tablet (800 mg total) by mouth every 8 (eight) hours as needed. 03/22/18   Lacroce, Hulen Shouts, MD  Insulin Pen Needle 31G X 5 MM MISC 1 each by Does not apply route 3 (three) times daily before meals. 07/25/17   Melanee Spry, MD  Lancets (ACCU-CHEK SOFT TOUCH) lancets Use to check blood sugar 2 times a day 07/25/17   Lacroce, Hulen Shouts, MD  LANTUS SOLOSTAR 100  UNIT/ML Solostar Pen INJECT 22 UNITS INTO THE SKIN DAILY AT 10 PM. 04/09/18   Lacroce, Hulen Shouts, MD  lisinopril (PRINIVIL,ZESTRIL) 10 MG tablet TAKE 1 TABLET BY MOUTH DAILY. 04/23/18   Lacroce, Hulen Shouts, MD  metFORMIN (GLUCOPHAGE) 1000 MG tablet Take 1 tablet (1,000 mg total) by mouth 2 (two) times daily with a meal. 07/18/18   Seawell, Jaimie A, DO  metoprolol succinate (TOPROL XL) 25 MG 24 hr tablet Take 1 tablet (25 mg total) by mouth daily before breakfast. 04/23/18 04/23/19  Lacroce, Hulen Shouts, MD  OVER THE COUNTER MEDICATION Place 1 drop into both eyes daily as needed (eye lubricant for dry eyes).    [provider]  sitaGLIPtin (JANUVIA) 100 MG tablet Take 1 tablet (100 mg total) by mouth daily. 07/25/17   Melanee Spry, MD    Family History Family History  Problem Relation Age of Onset  . Hypertension Mother   . Cancer Mother   . Breast cancer Mother 9  . Breast cancer Maternal Aunt 7  . Breast cancer Cousin 63    Social History Social History   Tobacco Use  . Smoking status: Never Smoker  . Smokeless tobacco: Never Used  Substance Use Topics  . Alcohol use: No  . Drug use: No     Allergies   Patient has no known allergies.   Review of Systems Review of Systems  Musculoskeletal: Positive for neck pain.  All other systems reviewed and are negative.    Physical Exam Updated Vital Signs BP 113/65   Pulse 91   Temp 98 F (36.7 C) (Oral)   Resp 20   SpO2 98%   Physical Exam  Constitutional: She is oriented to person, place, and time. She appears well-developed and well-nourished.  HENT:  Head: Normocephalic and atraumatic.  Mouth/Throat: Oropharynx is clear and moist.  Eyes: Pupils are equal, round, and reactive to light. Conjunctivae and EOM are normal.  Neck: Normal range of motion.  Muscular tenderness along the right cervical paraspinal musculature and along the SCM, spasm present, pain with attempted range of motion of the neck but no  rigidity; normal grip strength bilaterally, normal distal sensation and perfusion  Cardiovascular: Normal rate, regular rhythm and normal heart sounds.  Pulmonary/Chest: Effort normal and breath sounds normal. No stridor. No respiratory distress.  Abdominal: Soft. Bowel sounds are normal.  Musculoskeletal: Normal range of motion.  Neurological: She is alert and oriented to person, place, and time.  Skin: Skin is warm and dry.  Psychiatric: She has a normal mood and affect.  Nursing note and vitals reviewed.    ED Treatments / Results  Labs (all labs ordered are listed, but only abnormal results are displayed) Labs Reviewed - No data to display  EKG None  Radiology No results found.  Procedures Procedures (including critical care time)  Medications Ordered in ED Medications  ketorolac (TORADOL) injection 60 mg (60 mg Intramuscular Given 08/28/18 0423)  diazepam (VALIUM) tablet 5 mg (5 mg Oral Given 08/28/18 0423)     Initial Impression / Assessment and Plan / ED Course  I have reviewed the triage vital signs and the nursing notes.  Pertinent labs & imaging results that were available during my care of the patient were reviewed by me and considered in my medical decision making (see chart for details).  67 year old female here with atraumatic, right-sided neck pain which she describes as "spasm".  She is awake, alert, appropriately oriented.  Muscular tenderness along the right cervical paraspinal musculature and right sternocleidomastoid.  She has no rigidity.  Denies any chest pain or shortness of breath.  No deficits suggestive of central cord syndrome.  Without recent trauma or injury have very low suspicion for acute fracture or subluxation, suspect this is likely muscular spasm.  Will treat symptomatically here.  Will reassess.  Patient had good response to treatment here, states she is feeling better.  She remains without any neurologic deficits.  We will plan to discharge  home with continue symptomatic control.  Continue heat therapy.  Close follow-up with PCP.  Discussed plan with patient, she acknowledged understanding and agreed with plan of care.  Return precautions given for new or worsening symptoms.  Final Clinical Impressions(s) / ED Diagnoses   Final diagnoses:  Neck pain  Muscle spasms of neck    ED Discharge Orders         Ordered    diazepam (VALIUM) 2 MG tablet  2 times daily PRN     08/28/18 0515    naproxen (NAPROSYN) 500 MG tablet  2 times daily with meals     08/28/18 0515           Larene Pickett, PA-C 08/28/18 0602    Ripley Fraise, MD 08/28/18 [phone]

## 2018-08-28 NOTE — Discharge Instructions (Signed)
Take the prescribed medication as directed.  Can continue using heat therapy on the neck. Follow-up with your primary care doctor. Return to the ED for new or worsening symptoms.

## 2018-08-28 NOTE — ED Triage Notes (Signed)
Pt comes via [facility], states that she woke up tonight with neck pain, denies injury, unrelieved by tylenol

## 2018-08-29 ENCOUNTER — Encounter (HOSPITAL_COMMUNITY): Payer: Self-pay

## 2018-08-29 ENCOUNTER — Emergency Department (HOSPITAL_COMMUNITY)
Admission: EM | Admit: 2018-08-29 | Discharge: 2018-08-29 | Disposition: A | Discharge status: Home / Self Care | Payer: 59 | Attending: Emergency Medicine | Admitting: Emergency Medicine

## 2018-08-29 ENCOUNTER — Emergency Department (HOSPITAL_COMMUNITY): Payer: 59

## 2018-08-29 ENCOUNTER — Other Ambulatory Visit: Payer: Self-pay

## 2018-08-29 DIAGNOSIS — M791 Myalgia, unspecified site: Secondary | ICD-10-CM | POA: Insufficient documentation

## 2018-08-29 DIAGNOSIS — Z79899 Other long term (current) drug therapy: Secondary | ICD-10-CM | POA: Diagnosis not present

## 2018-08-29 DIAGNOSIS — M7918 Myalgia, other site: Secondary | ICD-10-CM

## 2018-08-29 DIAGNOSIS — Z794 Long term (current) use of insulin: Secondary | ICD-10-CM | POA: Insufficient documentation

## 2018-08-29 DIAGNOSIS — E119 Type 2 diabetes mellitus without complications: Secondary | ICD-10-CM | POA: Insufficient documentation

## 2018-08-29 DIAGNOSIS — M436 Torticollis: Secondary | ICD-10-CM | POA: Diagnosis not present

## 2018-08-29 DIAGNOSIS — I10 Essential (primary) hypertension: Secondary | ICD-10-CM | POA: Insufficient documentation

## 2018-08-29 DIAGNOSIS — M542 Cervicalgia: Secondary | ICD-10-CM | POA: Diagnosis not present

## 2018-08-29 MED ORDER — DIAZEPAM 5 MG PO TABS
5.0000 mg | ORAL_TABLET | Freq: Once | ORAL | Status: AC
  Administered 2018-08-29: 5 mg via ORAL
  Filled 2018-08-29: qty 1

## 2018-08-29 MED ORDER — KETOROLAC TROMETHAMINE 60 MG/2ML IM SOLN: 60.0000 mg | Freq: Once | INTRAMUSCULAR | Status: DC

## 2018-08-29 MED ORDER — METHYLPREDNISOLONE SODIUM SUCC 125 MG IJ SOLR
125.0000 mg | Freq: Once | INTRAMUSCULAR | Status: AC
  Administered 2018-08-29: 125 mg via INTRAVENOUS
  Filled 2018-08-29: qty 2

## 2018-08-29 MED ORDER — KETOROLAC TROMETHAMINE 30 MG/ML IJ SOLN
30.0000 mg | Freq: Once | INTRAMUSCULAR | Status: AC
  Administered 2018-08-29: 30 mg via INTRAVENOUS
  Filled 2018-08-29: qty 1

## 2018-08-29 NOTE — ED Triage Notes (Signed)
Pt states that she has been having muscle spams, was seen yesterday for same and given medication. Pt reports last muscle relaxer taken at 7 am this morning.

## 2018-08-29 NOTE — ED Notes (Signed)
Patient transported to MRI 

## 2018-08-29 NOTE — ED Provider Notes (Signed)
[facility] Provider Note   CSN: [id number] Arrival date & time: 08/29/18  1025     History   Chief Complaint Chief Complaint  Patient presents with  . Neck Pain    HPI Michele Andrews is a 67 y.o. female.  The history is provided by the patient. No language interpreter was used.  Neck Pain   This is a new problem. The current episode started more than 2 days ago. The problem occurs constantly. The problem has not changed since onset.The pain is associated with nothing. The pain is present in the generalized neck. The quality of the pain is described as aching. The pain does not radiate. The pain is moderate. The symptoms are aggravated by twisting. The pain is worse during the day. Pertinent negatives include no paresis, no tingling and no weakness. She has tried nothing for the symptoms. The treatment provided no relief.   Pt complains of stiffness in her neck.  Pt was seen here for the same yesterday.  Pt reports progressing stiffness. No relief with home medications Past Medical History:  Diagnosis Date  . Carpal tunnel syndrome   . Diabetes mellitus    Type II- diagnosed 30 yrs. ago  . Family history of adverse reaction to anesthesia    20 YRS. AGO, HER 20 YR. OLD NEPHEW, ENDED UP ON VENTILATOR- PT. THINKS ITS BECAUSE HE ASPIRATED   . Hypercalcemia   . Hyperlipidemia   . Hypertension   . Osteopenia   . Rotator cuff syndrome of right shoulder     Patient Active Problem List   Diagnosis Date Noted  . Pain in right foot 03/22/2018  . Breast pain, right 10/11/2017  . Acromioclavicular joint arthritis 10/10/2017  . Left shoulder pain 07/25/2017  . Osteoarthritis of left knee 02/23/2016  . Primary hyperparathyroidism ([place]) 11/08/2015  . Sinus tachycardia 07/28/2015  . Vitamin D insufficiency 03/10/2015  . Osteopenia 08/03/2014  . GERD (gastroesophageal reflux disease) 03/04/2014  . Preventative health care 01/06/2012  .  Hyperparathyroidism ([place]) 11/02/2008  . Hyperlipemia 11/22/2006  . Essential hypertension 11/22/2006  . Diabetes mellitus type 2, uncontrolled, without complications ([name]) 85/27/7824    Past Surgical History:  Procedure Laterality Date  . ABDOMINAL HYSTERECTOMY    . BREAST CYST EXCISION Left   . BREAST SURGERY Left 1963   cyst removed   . PARATHYROIDECTOMY Right 11/08/2015  . PARATHYROIDECTOMY N/A 11/08/2015   Procedure: RIGHT INFERIOR PARATHYROIDECTOMY;  Surgeon: Armandina Gemma, MD;  Location: [facility];  Service: General;  Laterality: N/A;     OB History   None      Home Medications    Prior to Admission medications   Medication Sig Start Date End Date Taking? Authorizing Provider  acetaminophen (TYLENOL) 650 MG CR tablet Take 650 mg by mouth every 8 (eight) hours as needed for pain.   Yes [provider]  Cholecalciferol (VITAMIN D3) 2000 units TABS Take 2,000 Units by mouth daily.   Yes [provider]  diazepam (VALIUM) 2 MG tablet Take 1 tablet (2 mg total) by mouth 2 (two) times daily as needed for muscle spasms. 08/28/18  Yes Larene Pickett, PA-C  diclofenac sodium (VOLTAREN) 1 % GEL APPLY 2 GRAMS TOPICALLY 4 TIMES DAILY. Patient taking differently: Apply 2 g topically See admin instructions. Apply 2 grams to neck or other affected area four times a day 02/12/18  Yes Lacroce, Hulen Shouts, MD  furosemide (LASIX) 40 MG tablet TAKE 1 TABLET BY  MOUTH DAILY. 11/29/17  Yes Lacroce, Hulen Shouts, MD  glipiZIDE (GLUCOTROL) 5 MG tablet TAKE 1 TABLET BY MOUTH 2 TIMES DAILY BEFORE A MEAL Patient taking differently: Take 10 mg by mouth every evening.  08/15/18  Yes Seawell, Jaimie A, DO  hydroxypropyl methylcellulose / hypromellose (ISOPTO TEARS / GONIOVISC) 2.5 % ophthalmic solution Place 1-2 drops into both eyes 3 (three) times daily as needed for dry eyes.   Yes [provider]  ibuprofen (ADVIL,MOTRIN) 800 MG tablet Take 1 tablet (800 mg total) by mouth every 8  (eight) hours as needed. Patient taking differently: Take 800 mg by mouth every 8 (eight) hours as needed (for pain).  03/22/18  Yes Lacroce, Hulen Shouts, MD  LANTUS SOLOSTAR 100 UNIT/ML Solostar Pen INJECT 22 UNITS INTO THE SKIN DAILY AT 10 PM. Patient taking differently: Inject 22 Units into the skin at bedtime.  04/09/18  Yes Lacroce, Hulen Shouts, MD  lisinopril (PRINIVIL,ZESTRIL) 10 MG tablet TAKE 1 TABLET BY MOUTH DAILY. 04/23/18  Yes Lacroce, Hulen Shouts, MD  metFORMIN (GLUCOPHAGE) 1000 MG tablet Take 1 tablet (1,000 mg total) by mouth 2 (two) times daily with a meal. 07/18/18  Yes Seawell, Jaimie A, DO  metoprolol succinate (TOPROL XL) 25 MG 24 hr tablet Take 1 tablet (25 mg total) by mouth daily before breakfast. 04/23/18 04/23/19 Yes Lacroce, Hulen Shouts, MD  naproxen (NAPROSYN) 500 MG tablet Take 1 tablet (500 mg total) by mouth 2 (two) times daily with a meal. 08/28/18  Yes Larene Pickett, PA-C  sitaGLIPtin (JANUVIA) 100 MG tablet Take 1 tablet (100 mg total) by mouth daily. 07/25/17  Yes Lacroce, Hulen Shouts, MD  ACCU-CHEK GUIDE test strip USE 3 TO 4 TIMES DAILY TO CHECK BLOOD SUGAR 08/27/18   Seawell, Jaimie A, DO  atorvastatin (LIPITOR) 40 MG tablet Take 1 tablet (40 mg total) by mouth daily. 11/15/16 08/28/18  Norman Herrlich, MD  Blood Glucose Monitoring Suppl [facility] BLOOD GLUCOSE) W/DEVICE KIT Use to check blood sugar 2 times a day     [provider]  Insulin Pen Needle 31G X 5 MM MISC 1 each by Does not apply route 3 (three) times daily before meals. 07/25/17   Melanee Spry, MD  Lancets (ACCU-CHEK SOFT TOUCH) lancets Use to check blood sugar 2 times a day 07/25/17   Melanee Spry, MD    Family History Family History  Problem Relation Age of Onset  . Hypertension Mother   . Cancer Mother   . Breast cancer Mother 29  . Breast cancer Maternal Aunt 2  . Breast cancer Cousin 17    Social History Social History   Tobacco Use  . Smoking status: Never Smoker  .  Smokeless tobacco: Never Used  Substance Use Topics  . Alcohol use: No  . Drug use: No     Allergies   Patient has no known allergies.   Review of Systems Review of Systems  Musculoskeletal: Positive for neck pain.  Neurological: Negative for tingling and weakness.  All other systems reviewed and are negative.    Physical Exam Updated Vital Signs BP 112/70   Pulse 83   Temp 98 F (36.7 C) (Oral)   Resp 16   Ht 5' 4"  (1.626 m)   Wt 78.9 kg   SpO2 98%   BMI 29.87 kg/m   Physical Exam  Constitutional: She is oriented to person, place, and time. She appears well-developed and well-nourished.  HENT:  Head: Normocephalic.  Right Ear:  External ear normal.  Left Ear: External ear normal.  Mouth/Throat: Oropharynx is clear and moist.  Eyes: Pupils are equal, round, and reactive to light. Conjunctivae and EOM are normal.  Neck: Normal range of motion.  Cardiovascular: Normal rate.  Pulmonary/Chest: Effort normal.  Abdominal: Soft. She exhibits no distension.  Musculoskeletal: Normal range of motion.  Tender diffuse neck,  Tender cervical spine,  Limited range of motion,  nv and ns intact   Neurological: She is alert and oriented to person, place, and time.  Skin: Skin is warm.  Psychiatric: She has a normal mood and affect.  Nursing note and vitals reviewed.    ED Treatments / Results  Labs (all labs ordered are listed, but only abnormal results are displayed) Labs Reviewed - No data to display  EKG None  Radiology Mr Cervical Spine Wo Contrast  Result Date: 08/29/2018 CLINICAL DATA:  67 year old female with neck pain and muscle spasms beginning 2 days ago. EXAM: MRI CERVICAL SPINE WITHOUT CONTRAST TECHNIQUE: Multiplanar, multisequence MR imaging of the cervical spine was performed. No intravenous contrast was administered. COMPARISON:  Cervical spine MRI 04/06/2010.  Head CT 11/01/2008. FINDINGS: Alignment: Chronic straightening of cervical lordosis has not  significantly changed. Vertebrae: Widespread degenerative endplate marrow signal changes with multilevel degenerative appearing endplate marrow edema, U7-O5 and C5-C6. Background bone marrow signal is within normal limits. No other No acute osseous abnormality identified. Cord: No cervical spinal cord signal abnormality despite multilevel spinal stenosis with cord mass effect. Posterior Fossa, vertebral arteries, paraspinal tissues: Bulky 4 x 5 centimeter left-side nasal cavity polyp (series 6, image 10) appears similar to the 2011 study and was also partially visible on the 2009 head CT. Posteriorly this tracks into the nasopharynx and abuts the adenoid tissue. Cervicomedullary junction is within normal limits. Grossly negative visible brain parenchyma. Preserved major vascular flow voids in the neck, the left vertebral artery is dominant and the right is diminutive. Negative neck soft tissues. Negative visible lung apices. Disc levels: C2-C3: Small central disc protrusion best seen on series 7, image 27. No stenosis. C3-C4: Chronic disc space loss with circumferential disc osteophyte complex. Broad-based posterior component. Up to moderate ligament flavum hypertrophy, mild facet hypertrophy. Spinal stenosis with mild spinal cord mass effect appears increased. Mild left and moderate to severe right C4 foraminal stenosis appear more stable. C4-C5: Severe disc space loss with circumferential disc osteophyte complex. Bulky broad-based posterior component appears increased since 2011. Increased mild to moderate ligament flavum hypertrophy. Increased mild spinal stenosis and spinal cord mass effect. Moderate bilateral C5 foraminal stenosis may be increased. C5-C6: Severe disc space loss with circumferential disc osteophyte complex. Broad-based posterior component, with superimposed mild to moderate ligament flavum hypertrophy. Mild left and moderate to severe right C6 neural foraminal stenosis appear stable. C6-C7: Disc  space loss with circumferential disc osteophyte complex. Broad-based posterior component with chronic but increased moderate ligament flavum hypertrophy. Mild spinal stenosis and spinal cord mass effect appear increased. Moderate bilateral C7 foraminal stenosis is stable. C7-T1: Bilateral facet ankylosis has occurred since 2011. No stenosis. No upper thoracic spinal stenosis. Widespread upper thoracic facet ankylosis suspected. IMPRESSION: 1. Chronic bulky left nasal cavity polyp/mass. This is probably not significantly changed since 2011, but ENT follow-up is recommended if the patient has not been previously evaluated. 2. Advanced cervical spine degeneration with chronic but increased since 2011 spinal stenosis and spinal cord mass effect from the C3-C4 to the C6-C7 level. Despite this, no associated spinal cord signal abnormality. 3. Superimposed  bilateral facet ankylosis has developed at the cervicothoracic junction and throughout the visible upper thoracic spine. 4. Widespread moderate and severe degenerative neural foraminal stenosis appears more stable since 2011. Electronically Signed   By: Genevie Ann M.D.   On: 08/29/2018 12:21    Procedures Procedures (including critical care time)  Medications Ordered in ED Medications  diazepam (VALIUM) tablet 5 mg (5 mg Oral Given 08/29/18 1227)  ketorolac (TORADOL) 30 MG/ML injection 30 mg (30 mg Intravenous Given 08/29/18 1227)     Initial Impression / Assessment and Plan / ED Course  I have reviewed the triage vital signs and the nursing notes.  Pertinent labs & imaging results that were available during my care of the patient were reviewed by me and considered in my medical decision making (see chart for details).     MDM  MRI reviewed and discussed with pt.  Pt given valium, torodol and solumedrol.   Pt advised to follow up with her MD for recheck in 2-3 days.   Final Clinical Impressions(s) / ED Diagnoses   Final diagnoses:  Torticollis, acute    Musculoskeletal pain    ED Discharge Orders    None    An After Visit Summary was printed and given to the patient.    Sidney Ace 08/29/18 1317    Jola Schmidt, MD 08/29/18 [phone]

## 2018-08-29 NOTE — Discharge Instructions (Addendum)
Continue current medications.  See your Physician for recheck in 2-3 days.  Continue ice/heat

## 2018-09-02 ENCOUNTER — Other Ambulatory Visit: Payer: Self-pay

## 2018-09-02 ENCOUNTER — Ambulatory Visit (INDEPENDENT_AMBULATORY_CARE_PROVIDER_SITE_OTHER): Payer: 59 | Admitting: Internal Medicine

## 2018-09-02 ENCOUNTER — Encounter: Payer: Self-pay | Admitting: Internal Medicine

## 2018-09-02 DIAGNOSIS — M542 Cervicalgia: Secondary | ICD-10-CM

## 2018-09-02 HISTORY — DX: Cervicalgia: M54.2

## 2018-09-02 NOTE — Progress Notes (Signed)
   CC: f/U of ED visit due to neck pain  HPI:  Michele Andrews is a 67 y.o. with PMHx as listed below, is here in clinic today for F/U of ED visit due to neck pain. Please see encounter base assessment and plan for more details.  Past Medical History:  Diagnosis Date  . Carpal tunnel syndrome   . Diabetes mellitus    Type II- diagnosed 30 yrs. ago  . Family history of adverse reaction to anesthesia    20 YRS. AGO, HER 20 YR. OLD NEPHEW, ENDED UP ON VENTILATOR- PT. THINKS ITS BECAUSE HE ASPIRATED   . Hypercalcemia   . Hyperlipidemia   . Hypertension   . Osteopenia   . Rotator cuff syndrome of right shoulder    Family Hx:                    HTN in mother                    Breast cancer in mother  Social Hx:  Non smoker                    No alcohol                     No drug  Review of Systems:  Review of Systems  HENT: Negative for hearing loss.   Eyes: Negative for blurred vision and double vision.  Cardiovascular: Negative for chest pain.  Gastrointestinal: Negative for abdominal pain, diarrhea, nausea and vomiting.  Musculoskeletal: Positive for neck pain.    Physical Exam:  Vitals:   09/02/18 1514  BP: 121/70  Pulse: 87  Temp: 97.9 F (36.6 C)  TempSrc: Oral  SpO2: 99%  Weight: 178 lb 11.2 oz (81.1 kg)  Height: 5\' 4"  (1.626 m)    Physical Exam  Constitutional: She appears well-developed and well-nourished. No distress.  HENT:  Head: Normocephalic and atraumatic.  Neck: ROM is normal with minor pain at end of full movements Eyes: EOM are normal.  Cardiovascular: Normal rate, regular rhythm and intact distal pulses.  No murmur heard. Pulmonary/Chest: Effort normal and breath sounds normal. She has no wheezes. She has no rales.  Abdominal: Soft. Bowel sounds are normal. She exhibits no distension. There is no tenderness.  Musculoskeletal: Normal range of motion. She exhibits no tenderness.  Neurological: No cranial nerve deficit or sensory deficit.  She exhibits normal muscle tone.  Psychiatric: She has a normal mood and affect.   Assessment & Plan:   See Encounters Tab for problem based charting.  Patient seen with Dr. Angelia Mould

## 2018-09-02 NOTE — Assessment & Plan Note (Signed)
It was a new problem. But almost resolved now.  Patient has had sever sudden onset neck pain few days ago.  Began when she was sleeping.  Pain was sharp mostly on right side of her neck, radiated to lower part of her head.  Had some nausea but no vomiting heat compress made it better but she still had the pain at the point that she called ambulance. Denies any trauma.  Reported some right upper extremity numbness.  No facial paresis, weakness no vision change, no headache.  She has 2 ED visits for this pain, neck MRI chronic strengthening of cervical lordosis with no change compared to previous imaging, some cervical spine degeneration with no associated spinal cord signal abnormality.  Patient was treated with Valium, Solu-Medrol, Toradol and was sent home.  She had another ED visit next day due to similar symptoms but has been improved after that.  Today she feels well denies any pain (and did not take any pain medicine today) and has no other complaints. On exam: No cervical musculoskeletal tenderness.  Nl sensory and motor exam. Range of motion is almost complete with mild pain when trying to have full range of motion.  No neck is stiffness.  Exam is generally reassuring Her history of sudden onset of neck pain seems to be due to severe muscle spasm.  Has responded to valium , Solu-Medrol and Toradol.  -No more treatment at this point but she can continue as needed Valium, ibuprofen, heat compress -Follow-up in clinic if pain reoccurs

## 2018-09-02 NOTE — Patient Instructions (Addendum)
It was our pleasure taking care of you in our clinic today. We saw you here in the clinic today for follow up of neck pain. Your neck pain has been resolved and your exam is normal and reassuring. Your neck pain has probably been due to sever muscle spasm. Please take Ibuprofen (with food) and Valium as prescribed in emergency room as needed for few more days. You can also use warm compress.  Please contact us if the pain reoccurs or if you have any question.   Thank you,  Dr. Linna Hoff

## 2018-09-03 NOTE — Progress Notes (Signed)
Internal Medicine Clinic Attending  I saw and evaluated the patient.  I personally confirmed the key portions of the history and exam documented by Dr. Masoudi and I reviewed pertinent patient test results.  The assessment, diagnosis, and plan were formulated together and I agree with the documentation in the resident's note. 

## 2018-09-11 ENCOUNTER — Other Ambulatory Visit: Payer: Self-pay | Admitting: Internal Medicine

## 2018-09-11 DIAGNOSIS — IMO0001 Reserved for inherently not codable concepts without codable children: Secondary | ICD-10-CM

## 2018-09-11 DIAGNOSIS — E1165 Type 2 diabetes mellitus with hyperglycemia: Principal | ICD-10-CM

## 2018-09-11 MED FILL — IBUPROFEN 800 MG TAB: 800 | 10 days supply | Qty: 30 | Fill #1

## 2018-09-11 MED FILL — JANUVIA 100 MG TABLET: 100 | 90 days supply | Qty: 90 | Fill #0

## 2018-09-11 NOTE — Telephone Encounter (Signed)
Next appt scheduled 10/22 with PCP. 

## 2018-10-01 ENCOUNTER — Encounter: Payer: Self-pay | Admitting: Internal Medicine

## 2018-10-01 ENCOUNTER — Other Ambulatory Visit: Payer: Self-pay

## 2018-10-01 ENCOUNTER — Ambulatory Visit (INDEPENDENT_AMBULATORY_CARE_PROVIDER_SITE_OTHER): Payer: 59 | Admitting: Internal Medicine

## 2018-10-01 VITALS — BP 113/98 | HR 93 | Temp 98.6°F | Ht 64.0 in | Wt 178.7 lb

## 2018-10-01 DIAGNOSIS — Z79899 Other long term (current) drug therapy: Secondary | ICD-10-CM | POA: Diagnosis not present

## 2018-10-01 DIAGNOSIS — E118 Type 2 diabetes mellitus with unspecified complications: Secondary | ICD-10-CM | POA: Diagnosis not present

## 2018-10-01 DIAGNOSIS — J339 Nasal polyp, unspecified: Secondary | ICD-10-CM

## 2018-10-01 DIAGNOSIS — Z7984 Long term (current) use of oral hypoglycemic drugs: Secondary | ICD-10-CM | POA: Diagnosis not present

## 2018-10-01 DIAGNOSIS — M4802 Spinal stenosis, cervical region: Secondary | ICD-10-CM | POA: Diagnosis not present

## 2018-10-01 DIAGNOSIS — Z1231 Encounter for screening mammogram for malignant neoplasm of breast: Secondary | ICD-10-CM

## 2018-10-01 DIAGNOSIS — Z0000 Encounter for general adult medical examination without abnormal findings: Secondary | ICD-10-CM

## 2018-10-01 DIAGNOSIS — M1712 Unilateral primary osteoarthritis, left knee: Secondary | ICD-10-CM

## 2018-10-01 DIAGNOSIS — M542 Cervicalgia: Secondary | ICD-10-CM

## 2018-10-01 DIAGNOSIS — E1165 Type 2 diabetes mellitus with hyperglycemia: Principal | ICD-10-CM

## 2018-10-01 DIAGNOSIS — N644 Mastodynia: Secondary | ICD-10-CM

## 2018-10-01 DIAGNOSIS — IMO0001 Reserved for inherently not codable concepts without codable children: Secondary | ICD-10-CM

## 2018-10-01 LAB — POCT GLYCOSYLATED HEMOGLOBIN (HGB A1C): HEMOGLOBIN A1C: 7.9 % — AB (ref 4.0–5.6)

## 2018-10-01 LAB — GLUCOSE, CAPILLARY: GLUCOSE-CAPILLARY: 174 mg/dL — AB (ref 70–99)

## 2018-10-01 MED ORDER — DICLOFENAC SODIUM 1 % TD GEL: TRANSDERMAL | 3 refills | Status: DC

## 2018-10-01 MED ORDER — CYCLOBENZAPRINE HCL 5 MG PO TABS: 5.0000 mg | ORAL_TABLET | Freq: Three times a day (TID) | ORAL | 0 refills | Status: AC | PRN

## 2018-10-01 MED FILL — CYCLOBENZAPRINE 5 MG TABLET: 5 | 7 days supply | Qty: 21 | Fill #0

## 2018-10-01 NOTE — Patient Instructions (Signed)
Thank you for allowing [place] to provide your care today. Today we discussed your Type II Diabetes and Neck Pain.   I have ordered a Hba1c test and basic metabolic panel for you. I will call if any are abnormal.    I have also referred your to opthalmology for routine screening of your eyes. This is recommended for all patients with diabetes.   Medications:  Please START taking cyclobenzaprine 5 mg three times per day as needed for neck pain. You do not need to take this medication if your neck is not hurting. Please also alternate ice and heat on your neck to help with muscle soreness.   Please follow-up in two weeks to discuss HbA1c and neck pain.    Should you have any questions or concerns please call the internal medicine clinic at [phone].

## 2018-10-01 NOTE — Progress Notes (Signed)
   CC: neck pain  HPI:  [name] is a 67 y.o. with PMH as below presenting after ED follow-up and for HbA1c check.    Please see A&P for assessment of the patient's chronic medical conditions.   Past Medical History:  Diagnosis Date  . Carpal tunnel syndrome   . Diabetes mellitus    Type II- diagnosed 30 yrs. ago  . Family history of adverse reaction to anesthesia    20 YRS. AGO, HER 20 YR. OLD NEPHEW, ENDED UP ON VENTILATOR- PT. THINKS ITS BECAUSE HE ASPIRATED   . Hypercalcemia   . Hyperlipidemia   . Hypertension   . Osteopenia   . Rotator cuff syndrome of right shoulder    Review of Systems:   Review of Systems  Cardiovascular: Negative for chest pain, palpitations and leg swelling.  Genitourinary: Negative for dysuria, frequency and urgency.  Musculoskeletal: Positive for joint pain, myalgias and neck pain. Negative for back pain and falls.  Neurological: Negative for dizziness, tingling, tremors, sensory change, focal weakness and headaches.   Physical Exam:  Constitution: NAD, obese, well-developed HENT: Nice, AT, NTTP posterior neck, no swelling or erythema, NTTP auricular area; no enlarged thyroid, no cervical lymphadenopathy Eyes: eom intact Cardio: tachycardic, no m/r/g Respiratory: CTAB, no w/r/r MSK: nml active ROM cervical region  Vitals:   10/01/18 1507  BP: (!) 113/98  Pulse: 93  Temp: 98.6 F (37 C)  TempSrc: Oral  SpO2: 98%  Weight: 178 lb 11.2 oz (81.1 kg)  Height: 5\' 4"  (1.626 m)    Assessment & Plan:   See Encounters Tab for problem based charting.  Patient seen with Dr. Lynnae Andrews

## 2018-10-02 ENCOUNTER — Other Ambulatory Visit: Payer: 59

## 2018-10-02 DIAGNOSIS — Z0000 Encounter for general adult medical examination without abnormal findings: Secondary | ICD-10-CM

## 2018-10-02 DIAGNOSIS — J339 Nasal polyp, unspecified: Secondary | ICD-10-CM | POA: Insufficient documentation

## 2018-10-02 LAB — BMP8+ANION GAP
Anion Gap: 15 mmol/L (ref 10.0–18.0)
BUN / CREAT RATIO: 15 (ref 12–28)
BUN: 11 mg/dL (ref 8–27)
CHLORIDE: 100 mmol/L (ref 96–106)
CO2: 23 mmol/L (ref 20–29)
Calcium: 9.9 mg/dL (ref 8.7–10.3)
Creatinine, Ser: 0.72 mg/dL (ref 0.57–1.00)
GFR calc Af Amer: 100 mL/min/{1.73_m2} (ref >59)
GFR calc non Af Amer: 86 mL/min/{1.73_m2} (ref >59)
GLUCOSE: 189 mg/dL — AB (ref 65–99)
Potassium: 3.9 mmol/L (ref 3.5–5.2)
SODIUM: 138 mmol/L (ref 134–144)

## 2018-10-02 NOTE — Assessment & Plan Note (Signed)
FOBT screen ordered. Patient does not want colonoscopy.  Mammogram referral sent

## 2018-10-02 NOTE — Addendum Note (Signed)
Addended by: Molli Hazard A on: 10/02/2018 09:53 AM   Modules accepted: Orders

## 2018-10-02 NOTE — Assessment & Plan Note (Addendum)
She states her glucose has been higher than usual recently as she is eating more sugar and bread. Her glucose has been running 130 - 200, and she states it is usually in the 90-100 range. Last HbA1c 7.6 03/2018. Diabetic foot exam today wnl.   - check HbA1c  - consider changing glipizide to SGLT-2 inhibitor with usual lower blood sugars  - opthamology referral ordered  ADDENDUM: Hba1c increased 7.9 from 7.6. Discussed results with patient and she would like to continue with diet management at this time. 1 month f/u for HbA1c check. If no change or increased, she is willing to alter medications.

## 2018-10-02 NOTE — Assessment & Plan Note (Addendum)
Seen on MRI 08/29/18 unchanged from 2011  - discuss with patient at f/u if she has seen ENT for this  ADDENDUM: she was unaware of the polyp and has not seen ENT in the past. ENT referral submitted.

## 2018-10-02 NOTE — Assessment & Plan Note (Signed)
Patient visited ED 9/19 for neck pain and was given steroid shot. She states this helped but her symptoms have not resolved completely. She continues to have pain with neck movement, mainly with rotating her neck left or right. Her pain is relieved by NSAIDs, massaging her neck, and using ice. MRI done in the ED showed chronic foraminal stenosis that appears more stable since 2011 MRI. She has no radiating pain, numbness or tingling, no incontinence or other alarm symptoms.   - flexaril 5 gm TID short course  - hx of vit D def - check vitamin D level check at f/u  - f/u one month - consider ortho referral if symptoms not resolved  - refill diclofenac gel

## 2018-10-02 NOTE — Progress Notes (Signed)
Internal Medicine Clinic Attending  I saw and evaluated the patient.  I personally confirmed the key portions of the history and exam documented by Dr. Sharon Seller and I reviewed pertinent patient test results.  The assessment, diagnosis, and plan were formulated together and I agree with the documentation in the resident's note. One month will be too soon to check a repeat A1C but could review CBG's to gauge DM control.

## 2018-10-03 ENCOUNTER — Other Ambulatory Visit: Payer: Self-pay | Admitting: Internal Medicine

## 2018-10-03 DIAGNOSIS — E1165 Type 2 diabetes mellitus with hyperglycemia: Principal | ICD-10-CM

## 2018-10-03 DIAGNOSIS — IMO0001 Reserved for inherently not codable concepts without codable children: Secondary | ICD-10-CM

## 2018-10-03 MED ORDER — EMPAGLIFLOZIN 10 MG PO TABS: 10.0000 mg | ORAL_TABLET | Freq: Every day | ORAL | 2 refills | Status: DC

## 2018-10-03 MED FILL — JARDIANCE 10 MG TABLET: 10 | 30 days supply | Qty: 30 | Fill #0

## 2018-10-03 NOTE — Progress Notes (Signed)
Spoke with patient, and she states she is interested in changing her TIIDM therapy with several episodes of hypoglycemia despite increase in A1C. She previously preferred medical management and also plans to alter her diet.   - stop glipizide 5mg  - start empagliflozin 10 mg qd - f/u 3 months HbA1c check

## 2018-10-06 LAB — FECAL OCCULT BLOOD, IMMUNOCHEMICAL: FECAL OCCULT BLD: NEGATIVE

## 2018-10-22 MED FILL — LANTUS SOLOSTAR 100 UNITS/M: 100 | 68 days supply | Qty: 15 | Fill #3

## 2018-10-22 MED FILL — metFORMIN HCL 1000 MG TABS: 1000 | 90 days supply | Qty: 180 | Fill #1

## 2018-10-25 ENCOUNTER — Ambulatory Visit (INDEPENDENT_AMBULATORY_CARE_PROVIDER_SITE_OTHER): Payer: 59 | Admitting: Internal Medicine

## 2018-10-25 ENCOUNTER — Encounter: Payer: Self-pay | Admitting: Internal Medicine

## 2018-10-25 ENCOUNTER — Other Ambulatory Visit: Payer: Self-pay

## 2018-10-25 DIAGNOSIS — Z794 Long term (current) use of insulin: Secondary | ICD-10-CM | POA: Diagnosis not present

## 2018-10-25 DIAGNOSIS — E119 Type 2 diabetes mellitus without complications: Secondary | ICD-10-CM

## 2018-10-25 DIAGNOSIS — IMO0001 Reserved for inherently not codable concepts without codable children: Secondary | ICD-10-CM

## 2018-10-25 DIAGNOSIS — E1165 Type 2 diabetes mellitus with hyperglycemia: Principal | ICD-10-CM

## 2018-10-25 MED ORDER — GLIPIZIDE 10 MG PO TABS: ORAL_TABLET | ORAL | 1 refills | Status: DC

## 2018-10-25 MED FILL — glipiZIDE 10 MG TABS: 10 | 30 days supply | Qty: 45 | Fill #0

## 2018-10-25 NOTE — Assessment & Plan Note (Signed)
Patient is here for diabetes follow up. Her last hemoglobin A1c was 7.9. She is currently prescribed metformin 2000 mg daily, lantus 22 units daily, Empagliflozin 10 mg daily and januvia 100 mg daily. The empagliflozin is a new medication which was started as an alternative to glipizide at last office visit. She did not bring her glucometer with her today but says that her highest blood sugar was 200 and fasting yesterday was 180. Today she request to switch back to the glipizide which was prescribed as 10 mg daily. She has had hypoglycemia in the past but not within the last few years. She is feeling motivated to cut back on bread in her diet, has already switched from having muffins for breakfast to oatmeal.  - increase glipizide to 15 mg daily  - continue metformin 2000 mg daily, lantus 22 units daily and januvia 100 mg daily  - follow up in two months with PCP for A1c

## 2018-10-25 NOTE — Patient Instructions (Addendum)
Thank you for coming to the clinic today. It was a pleasure to see you.   For your diabetes - please stop taking the empagliflozin and switch to glipizide 10 mg in the morning and 5 mg in the evening.   If you feel that your blood sugar is going low please check it with your meter, and try eating some Smarties or a sugar pack. This should help better than peanut butter which will keep your sugar elevated for a longer period.   FOLLOW-UP INSTRUCTIONS When: 2 months with Dr. Sharon Seller  For: Follow up of your diabetes  What to bring: all of your medication bottles and meter   Please call the internal medicine center clinic if you have any questions or concerns, we may be able to help and keep you from a long and expensive emergency room wait. Our clinic and after hours phone number is [phone], the best time to call is Monday through Friday 9 am to 4 pm but there is always someone available 24/7 if you have an emergency. If you need medication refills please notify your pharmacy one week in advance and they will send [place] a request.

## 2018-10-25 NOTE — Progress Notes (Signed)
   CC: follow up of diabetes    HPI:  [name] is a 67 y.o. with PMH as listed below who presents for follow up of diabetes . Please see the assessment and plans for the status of the patient chronic medical problems.    Past Medical History:  Diagnosis Date  . Carpal tunnel syndrome   . Diabetes mellitus    Type II- diagnosed 30 yrs. ago  . Family history of adverse reaction to anesthesia    20 YRS. AGO, HER 20 YR. OLD NEPHEW, ENDED UP ON VENTILATOR- PT. THINKS ITS BECAUSE HE ASPIRATED   . Hypercalcemia   . Hyperlipidemia   . Hypertension   . Osteopenia   . Rotator cuff syndrome of right shoulder    Review of Systems:  Refer to history of present illness and assessment and plans for pertinent review of systems, all others reviewed and negative  Physical Exam:  Vitals:   10/25/18 1452  BP: 130/78  Pulse: 98  Temp: 98.4 F (36.9 C)  TempSrc: Oral  SpO2: 99%  Weight: 178 lb 9.6 oz (81 kg)  Height: 5\' 4"  (1.626 m)   General: well appearing, no acute distress  Cardiac: regular rate and rhythm, no murmurs, no peripheral edema   Assessment & Plan:   Type 2 Diabetes Mellitus  Patient is here for diabetes follow up. Her last hemoglobin A1c was 7.9. She is currently prescribed metformin 2000 mg daily, lantus 22 units daily, Empagliflozin 10 mg daily and januvia 100 mg daily. The empagliflozin is a new medication which was started as an alternative to glipizide at last office visit. She did not bring her glucometer with her today but says that her highest blood sugar was 200 and fasting yesterday was 180. Today she request to switch back to the glipizide which was prescribed as 10 mg daily. She has had hypoglycemia in the past but not within the last few years. She is feeling motivated to cut back on bread in her diet, has already switched from having muffins for breakfast to oatmeal.  - increase glipizide to 15 mg daily  - continue metformin 2000 mg daily, lantus 22  units daily and januvia 100 mg daily  - follow up in two months with PCP for A1c   See Encounters Tab for problem based charting.  Patient discussed with Dr. Daryll Drown

## 2018-10-28 ENCOUNTER — Other Ambulatory Visit: Payer: Self-pay | Admitting: Internal Medicine

## 2018-10-28 DIAGNOSIS — I10 Essential (primary) hypertension: Secondary | ICD-10-CM

## 2018-10-28 MED FILL — METOPROLOL SUCCINATE ER 25: 25 | 90 days supply | Qty: 90 | Fill #0

## 2018-10-28 MED FILL — LISINOPRIL 10 MG TABS: 10 | 90 days supply | Qty: 90 | Fill #0

## 2018-10-30 DIAGNOSIS — H2513 Age-related nuclear cataract, bilateral: Secondary | ICD-10-CM | POA: Diagnosis not present

## 2018-10-30 DIAGNOSIS — E119 Type 2 diabetes mellitus without complications: Secondary | ICD-10-CM | POA: Diagnosis not present

## 2018-10-30 LAB — HM DIABETES EYE EXAM

## 2018-10-30 NOTE — Progress Notes (Signed)
Internal Medicine Clinic Attending  Case discussed with Dr. Blum at the time of the visit.  We reviewed the resident's history and exam and pertinent patient test results.  I agree with the assessment, diagnosis, and plan of care documented in the resident's note. 

## 2018-10-30 NOTE — Addendum Note (Signed)
Addended by: Gilles Chiquito B on: 10/30/2018 09:07 PM   Modules accepted: Level of Service

## 2018-11-12 ENCOUNTER — Telehealth: Payer: Self-pay | Admitting: Dietician

## 2018-11-12 NOTE — Telephone Encounter (Signed)
Appointment scheduled for CGM placement.

## 2018-11-13 ENCOUNTER — Other Ambulatory Visit: Payer: Self-pay | Admitting: Dietician

## 2018-11-13 ENCOUNTER — Encounter: Payer: Self-pay | Admitting: *Deleted

## 2018-11-13 ENCOUNTER — Encounter: Payer: Self-pay | Admitting: Dietician

## 2018-11-13 ENCOUNTER — Ambulatory Visit (INDEPENDENT_AMBULATORY_CARE_PROVIDER_SITE_OTHER): Payer: 59 | Admitting: Dietician

## 2018-11-13 DIAGNOSIS — IMO0001 Reserved for inherently not codable concepts without codable children: Secondary | ICD-10-CM

## 2018-11-13 DIAGNOSIS — E118 Type 2 diabetes mellitus with unspecified complications: Secondary | ICD-10-CM

## 2018-11-13 DIAGNOSIS — E1165 Type 2 diabetes mellitus with hyperglycemia: Principal | ICD-10-CM

## 2018-11-13 NOTE — Progress Notes (Signed)
Documentation for Freestyle Libre Pro Continuous glucose monitoring Freestyle Libre Pro CGM sensor placed today. Patient was educated about wearing sensor, keeping food, activity and medication log and when to call office. Patient was educated about how to care for the sensor and not to have an MRI, CT or Diathermy while wearing the sensor. Follow up was arranged with the patient for 1 week.   Lot #: [id number] Serial #: [id number] Expiration Date: 04/10/2019  Debera Lat, RD 11/13/2018 11:34 AM.

## 2018-11-13 NOTE — Progress Notes (Signed)
CGM referral request 

## 2018-11-13 NOTE — Patient Instructions (Signed)
Please record the time, amount and what food drinks and activities you have while wearing the continuous glucose monitor(CGM).  Bring the folder with you to follow up appointments  Do not have a CT or an MRI while wearing the CGM.   Please make an appointment for 1 week with me and a doctor for the first of two CGM downloads..   You will also return in 2 weeks to have your second download and the CGM remove  Donna [phone] 

## 2018-11-15 MED FILL — DICLOFENAC SODIUM 1% GEL: 1 | 13 days supply | Qty: 100 | Fill #0

## 2018-11-18 MED FILL — glipiZIDE 10 MG TABS: 10 | 30 days supply | Qty: 45 | Fill #1

## 2018-11-20 ENCOUNTER — Other Ambulatory Visit: Payer: Self-pay | Admitting: Internal Medicine

## 2018-11-20 ENCOUNTER — Ambulatory Visit: Payer: 59 | Admitting: Dietician

## 2018-11-20 ENCOUNTER — Encounter: Payer: Self-pay | Admitting: Dietician

## 2018-11-20 DIAGNOSIS — M25512 Pain in left shoulder: Secondary | ICD-10-CM

## 2018-11-20 MED FILL — IBUPROFEN 800 MG TAB: 800 | 10 days supply | Qty: 30 | Fill #0

## 2018-11-20 NOTE — Progress Notes (Signed)
CGM Download #1 Documentation:  Start time: 10:16 AM  End time: 10:33 AM  Michele Andrews presents for CGM download #1. She wore the CGM for 7 days. The average reading was 115, % time in target was 71, % time below target was 21, and % time above target was 8. She does not reports any signs or symptoms of hypoglycemia. The hypoglycemia occurred between 9 Pm and 6 AM  6 days,  mid morning one day and ~ 8 Pm on another day.    She currently takes 10 mg glipizide in the morning and 5 mg in the evening. She feels that her intake is more than her usual due to holiday parties and has been trying to limit sweets and portions.   Intervention will be to stop evening dose of Glipizide of which patient and Dr. Ayesha Rumpf physician) are in agreement. She agrees to start checking her blood sugar before bed. She also agreed to review her food and activity record for causes of both high and low blood sugars.  The patient will be scheduled to see myself in 1 week for a final appointment.   Debera Lat, RD 11/20/2018 12:17 PM.

## 2018-11-25 MED FILL — FUROSEMIDE 40 MG TAB: 40 | 90 days supply | Qty: 90 | Fill #4

## 2018-11-27 ENCOUNTER — Encounter: Payer: Self-pay | Admitting: Dietician

## 2018-11-27 ENCOUNTER — Ambulatory Visit (INDEPENDENT_AMBULATORY_CARE_PROVIDER_SITE_OTHER): Payer: 59 | Admitting: Dietician

## 2018-11-27 DIAGNOSIS — IMO0001 Reserved for inherently not codable concepts without codable children: Secondary | ICD-10-CM

## 2018-11-27 DIAGNOSIS — E118 Type 2 diabetes mellitus with unspecified complications: Secondary | ICD-10-CM | POA: Diagnosis not present

## 2018-11-27 DIAGNOSIS — Z713 Dietary counseling and surveillance: Secondary | ICD-10-CM | POA: Diagnosis not present

## 2018-11-27 DIAGNOSIS — E1165 Type 2 diabetes mellitus with hyperglycemia: Principal | ICD-10-CM

## 2018-11-27 NOTE — Progress Notes (Signed)
  Medical Nutrition Therapy:  Appt start time: 1115 end time:  4709. Visit # 1  Assessment:  Primary concerns today: CGM download #2  Michele Andrews presents for her 2nd and final download of her Continuous glucose monitoring sensor. CGM Results from Download #2. The average is  123  for 15 days Time in range (70-180 mg/dL): 76% (Goal >70%) Time above range (>180 mg/dL) 10% Time below range (<70 mg/dL): 14% (Goal is <3%) Coefficient of variation:35.7% (Goal is <36%) Standard variation:43.9 mg/dL (Goal is <50 mg/dL)  She has significantly less hypoglycemia her second week but still shows a pattern of lowering blood sugars from ~ 180mg /dl at bedtime to 70mg /dl by 3 AM- 7 AM  Preferred Learning Style: Auditory,Visual,Hands on  Learning Readiness:Ready  ANTHROPOMETRICS: deferred today WEIGHT HISTORY: stable over the past year SLEEP:no problems reported Diabetes MEDICATIONS: lantus 22 units at bedtime, 10 mg glipizide in the morning BLOOD SUGAR:checks about once time a day, does not hv her meter with her today DIETARY INTAKE: Usual eating pattern includes 2-3 meals and 0-1 snacks per day.  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   Nutritional Diagnosis:  NB-1.4 Self-monitoring deficit As related to lack of adequate self monitoring to detect hypoglycemia and glucose patterns.  As evidenced by CGM reports.    Intervention:  Nutrition education about Continuous glucose monitoring reports and how to interpret based on acceptable targets. Action Goal:decrease lantus to 20 units and move it to morning time. Check blood sugar more often and notify office of hypoglycemia  Outcome goal: decreased hypoglycemia with continued A1C/glucsoe control Coordination of care: The above was discussed with Dr. Joni Reining  Teaching Method Utilized: Visual, Auditory,Hands on Handouts given during visit include: After visit  summary, CGM reports Barriers to learning/adherence to lifestyle change: competing values Demonstrated degree of understanding via:  Teach Back   Monitoring/Evaluation:  Dietary intake, exercise, meter, and body weight in 6 month(s)  Debera Lat, RD 11/27/2018 2:07 PM. .

## 2018-11-27 NOTE — Patient Instructions (Addendum)
  Ms. Tello,  This is what we discussed today:   Your Action Goal:decrease Lantus to 20 units and move it to morning time. Check blood sugar more often and notify office of hypoglycemia  Reason for doing the above: decreased hypoglycemia with continued A1C/glucose control  Could consider in the future adding Prandin or rapid acting insulin before larger meals.   I suggest we follow up in  About 6 months.   [name] [phone]

## 2018-11-28 ENCOUNTER — Encounter: Payer: Self-pay | Admitting: *Deleted

## 2018-12-12 ENCOUNTER — Other Ambulatory Visit: Payer: Self-pay | Admitting: Internal Medicine

## 2018-12-12 DIAGNOSIS — E1165 Type 2 diabetes mellitus with hyperglycemia: Principal | ICD-10-CM

## 2018-12-12 DIAGNOSIS — IMO0001 Reserved for inherently not codable concepts without codable children: Secondary | ICD-10-CM

## 2018-12-12 MED FILL — JANUVIA 100 MG TABLET: 100 | 90 days supply | Qty: 90 | Fill #1

## 2018-12-13 ENCOUNTER — Other Ambulatory Visit: Payer: Self-pay | Admitting: Internal Medicine

## 2018-12-13 ENCOUNTER — Telehealth: Payer: Self-pay | Admitting: *Deleted

## 2018-12-13 DIAGNOSIS — N644 Mastodynia: Secondary | ICD-10-CM

## 2018-12-13 DIAGNOSIS — Z803 Family history of malignant neoplasm of breast: Secondary | ICD-10-CM

## 2018-12-13 DIAGNOSIS — Z0000 Encounter for general adult medical examination without abnormal findings: Secondary | ICD-10-CM

## 2018-12-13 MED FILL — glipiZIDE 5 MG TABS: 5 | 30 days supply | Qty: 60 | Fill #0

## 2018-12-13 NOTE — Telephone Encounter (Signed)
Pt called back to say she spoke with DrBlum about her right breast pain during a recent visit and not her pcp.  Will send info to DrBlum for review. Again pt will need a new order for diagnostic mammo and right breast u/s if appropriate. Please advise.Despina Hidden Cassady1/3/20202:03 PM

## 2018-12-13 NOTE — Progress Notes (Signed)
Patient called office requesting yearly mammogram. She endorses some recent breast tenderness but no changes in color, erythema, or warmth. States she has a family history of breast cancer and will order diagnostic bilateral mammogram as she is endorsing some soreness.

## 2018-12-13 NOTE — Telephone Encounter (Signed)
Call from patient- she went for a mammogram and informed the [facility] of some tenderness/pain in right breast.  [facility] suggested she contact her pcp to have order changed from screening mammogram to a bilateral diagnostic mammogram with right breast ultrasound (dx right breast pain).  Pt states she has spoken with MD before about this problem and does not feel a visit is warranted. Will forward info to pcp for consideration, please advise.Despina Hidden Cassady1/3/20209:59 AM

## 2018-12-13 NOTE — Telephone Encounter (Signed)
Hello, thank you for letting me know. It looks like the mammogram was ordered during her her continuity visit in October and she did not mention breast pain during the [facility] visit with me. Ive spoken with Dr. Sharon Seller and she is going to weigh in on how we should proceed.

## 2018-12-18 ENCOUNTER — Telehealth: Payer: Self-pay | Admitting: *Deleted

## 2018-12-18 NOTE — Telephone Encounter (Signed)
LVM WITH HUSBAND / CLARENCE, FOR Cydnie TO RETURN CALL TO CLINIC. DR SHOEMAKER OFFICE UNABLE TO GET IN CONTACT WITH THE PATIENT.

## 2018-12-19 ENCOUNTER — Other Ambulatory Visit: Payer: Self-pay | Admitting: Internal Medicine

## 2018-12-19 DIAGNOSIS — N644 Mastodynia: Secondary | ICD-10-CM

## 2018-12-19 DIAGNOSIS — Z803 Family history of malignant neoplasm of breast: Secondary | ICD-10-CM

## 2018-12-20 ENCOUNTER — Other Ambulatory Visit: Payer: Self-pay

## 2018-12-20 ENCOUNTER — Other Ambulatory Visit: Payer: Self-pay | Admitting: Internal Medicine

## 2018-12-20 DIAGNOSIS — N644 Mastodynia: Secondary | ICD-10-CM

## 2018-12-20 DIAGNOSIS — Z803 Family history of malignant neoplasm of breast: Secondary | ICD-10-CM

## 2018-12-26 ENCOUNTER — Other Ambulatory Visit: Payer: Self-pay | Admitting: Internal Medicine

## 2018-12-26 DIAGNOSIS — E1165 Type 2 diabetes mellitus with hyperglycemia: Principal | ICD-10-CM

## 2018-12-26 DIAGNOSIS — IMO0001 Reserved for inherently not codable concepts without codable children: Secondary | ICD-10-CM

## 2018-12-27 ENCOUNTER — Ambulatory Visit: Admission: RE | Admit: 2018-12-27 | Payer: 59 | Source: Ambulatory Visit

## 2018-12-27 ENCOUNTER — Ambulatory Visit
Admission: RE | Admit: 2018-12-27 | Discharge: 2018-12-27 | Disposition: A | Discharge status: Home / Self Care | Payer: No Typology Code available for payment source | Source: Ambulatory Visit | Attending: Internal Medicine | Admitting: Internal Medicine

## 2018-12-27 ENCOUNTER — Ambulatory Visit: Payer: 59

## 2018-12-27 ENCOUNTER — Other Ambulatory Visit: Payer: Self-pay | Admitting: Internal Medicine

## 2018-12-27 DIAGNOSIS — IMO0001 Reserved for inherently not codable concepts without codable children: Secondary | ICD-10-CM

## 2018-12-27 DIAGNOSIS — Z803 Family history of malignant neoplasm of breast: Secondary | ICD-10-CM

## 2018-12-27 DIAGNOSIS — E1165 Type 2 diabetes mellitus with hyperglycemia: Principal | ICD-10-CM

## 2018-12-27 DIAGNOSIS — N644 Mastodynia: Secondary | ICD-10-CM

## 2018-12-27 MED FILL — glipiZIDE 10 MG TABS: 10 | 30 days supply | Qty: 45 | Fill #0

## 2018-12-27 MED FILL — LANTUS SOLOSTAR 100 UNITS/M: 100 | 68 days supply | Qty: 15 | Fill #0

## 2019-01-06 ENCOUNTER — Other Ambulatory Visit: Payer: Self-pay | Admitting: Internal Medicine

## 2019-01-06 DIAGNOSIS — M25512 Pain in left shoulder: Secondary | ICD-10-CM

## 2019-01-08 MED FILL — IBUPROFEN 800 MG TAB: 800 | 10 days supply | Qty: 30 | Fill #0

## 2019-01-14 ENCOUNTER — Other Ambulatory Visit: Payer: Self-pay | Admitting: Internal Medicine

## 2019-01-14 DIAGNOSIS — M542 Cervicalgia: Secondary | ICD-10-CM

## 2019-01-14 MED FILL — DICLOFENAC SODIUM 1 % GEL: 1 | 13 days supply | Qty: 100 | Fill #1

## 2019-01-22 MED FILL — metFORMIN HCL 1000 MG TABS: 1000 | 90 days supply | Qty: 180 | Fill #2

## 2019-01-27 MED FILL — glipiZIDE 5 MG TABS: 5 | 30 days supply | Qty: 60 | Fill #1

## 2019-01-28 MED FILL — METOPROLOL SUCCINATE ER 25: 25 | 90 days supply | Qty: 90 | Fill #1

## 2019-01-28 MED FILL — LISINOPRIL 10 MG TABS: 10 | 90 days supply | Qty: 90 | Fill #1

## 2019-02-10 MED FILL — IBUPROFEN 800 MG TABS: 800 | 10 days supply | Qty: 30 | Fill #1

## 2019-02-19 ENCOUNTER — Encounter: Payer: No Typology Code available for payment source | Admitting: Internal Medicine

## 2019-02-24 ENCOUNTER — Encounter (INDEPENDENT_AMBULATORY_CARE_PROVIDER_SITE_OTHER): Payer: Self-pay

## 2019-02-24 ENCOUNTER — Other Ambulatory Visit: Payer: Self-pay

## 2019-02-24 ENCOUNTER — Ambulatory Visit (INDEPENDENT_AMBULATORY_CARE_PROVIDER_SITE_OTHER): Payer: No Typology Code available for payment source | Admitting: Internal Medicine

## 2019-02-24 ENCOUNTER — Encounter: Payer: Self-pay | Admitting: Internal Medicine

## 2019-02-24 VITALS — BP 147/83 | HR 96 | Temp 98.5°F | Wt 178.9 lb

## 2019-02-24 DIAGNOSIS — M542 Cervicalgia: Secondary | ICD-10-CM

## 2019-02-24 DIAGNOSIS — I10 Essential (primary) hypertension: Secondary | ICD-10-CM | POA: Diagnosis not present

## 2019-02-24 DIAGNOSIS — M4802 Spinal stenosis, cervical region: Secondary | ICD-10-CM

## 2019-02-24 DIAGNOSIS — R202 Paresthesia of skin: Secondary | ICD-10-CM | POA: Diagnosis not present

## 2019-02-24 DIAGNOSIS — E118 Type 2 diabetes mellitus with unspecified complications: Secondary | ICD-10-CM

## 2019-02-24 DIAGNOSIS — Z794 Long term (current) use of insulin: Secondary | ICD-10-CM

## 2019-02-24 DIAGNOSIS — E1165 Type 2 diabetes mellitus with hyperglycemia: Secondary | ICD-10-CM | POA: Diagnosis not present

## 2019-02-24 DIAGNOSIS — J339 Nasal polyp, unspecified: Secondary | ICD-10-CM | POA: Diagnosis not present

## 2019-02-24 DIAGNOSIS — IMO0001 Reserved for inherently not codable concepts without codable children: Secondary | ICD-10-CM

## 2019-02-24 DIAGNOSIS — Z79899 Other long term (current) drug therapy: Secondary | ICD-10-CM

## 2019-02-24 LAB — GLUCOSE, CAPILLARY: Glucose-Capillary: 130 mg/dL — ABNORMAL HIGH (ref 70–99)

## 2019-02-24 LAB — POCT GLYCOSYLATED HEMOGLOBIN (HGB A1C): Hemoglobin A1C: 7.5 % — AB (ref 4.0–5.6)

## 2019-02-24 MED ORDER — CYCLOBENZAPRINE HCL 5 MG PO TABS: 5.0000 mg | ORAL_TABLET | Freq: Three times a day (TID) | ORAL | 0 refills | Status: AC | PRN

## 2019-02-24 MED FILL — CYCLOBENZAPRINE 5 MG TABLET: 5 | 15 days supply | Qty: 45 | Fill #0

## 2019-02-24 NOTE — Progress Notes (Signed)
   CC: neck pain  HPI:  [name] is a 68 y.o. with PMH as below.   Please see A&P for assessment of the patient's acute and chronic medical conditions.   Past Medical History:  Diagnosis Date  . Carpal tunnel syndrome   . Diabetes mellitus    Type II- diagnosed 30 yrs. ago  . Family history of adverse reaction to anesthesia    20 YRS. AGO, Michele Andrews 19 YR. OLD NEPHEW, ENDED UP ON VENTILATOR- PT. THINKS ITS BECAUSE HE ASPIRATED   . Hypercalcemia   . Hyperlipidemia   . Hypertension   . Osteopenia   . Rotator cuff syndrome of right shoulder    Review of Systems:   Review of Systems  Constitutional: Negative for chills, fever and malaise/fatigue.  HENT: Negative for congestion, sinus pain and sore throat.   Eyes: Negative for blurred vision, pain and redness.  Respiratory: Negative for cough, wheezing and stridor.   Cardiovascular: Negative for chest pain and leg swelling.  Gastrointestinal: Negative for abdominal pain, constipation, diarrhea, nausea and vomiting.  Genitourinary: Negative for dysuria, frequency and urgency.  Musculoskeletal: Positive for myalgias and neck pain. Negative for back pain and falls.  Neurological: Positive for tingling. Negative for dizziness, speech change, focal weakness, weakness and headaches.   Physical Exam:  Constitution: NAD, obese HENT: negative spurlings, No cervical tenderness, normal ROM, pain with active ROM rotating neck left  Cardio: RRR, no m/r/g  Respiratory: CTA, no w/r/r  MSK: no edema, moving all extremities, strength symmetricaly and sensations intact   Vitals:   02/24/19 0911  BP: (!) 147/83  Pulse: 96  Temp: 98.5 F (36.9 C)  TempSrc: Oral  SpO2: 100%  Weight: 178 lb 14.4 oz (81.1 kg)    Assessment & Plan:   See Encounters Tab for problem based charting.  Patient discussed with Dr. Angelia Mould

## 2019-02-24 NOTE — Assessment & Plan Note (Signed)
She continues to have neck pain that she describes as spasms, pressure on the left but pain on the right side of her neck that radiates into her shoulder. It does not radiate into her arms or hands although she does wake up with tingling in her hands sometimes at night. She states icing her neck and the diclofenac do help. She requests refill of flexaril. As her symptoms are ongoing and she does show signs of stenosis on CT I will refer her to ortho. Will also recommend PT after seeing ortho, which she is agreeable to as well.   - refill flexaril 5 MG tid 15 days, explained that this is a temporary medication for her pain, which she understood.  - cont. Diclofenac gel and icing the area - she states heat does not help as much  - referral to ortho

## 2019-02-24 NOTE — Patient Instructions (Signed)
Thank you for allowing [place] to provide your care today. Today we discussed your neck pain and type II diabetes  I have ordered the following labs for you:  Basic metabolic panel  I will call if any are abnormal.    Today we made the following changes to your medications:   Please START taking  flexaril 5 MG tablet three times a day as needed for neck spasms. Please take a break from this medication after a few days as you can build up tolerance. Please be aware that this medicine can make you drowsy.    Please follow-up in six months.    Should you have any questions or concerns please call the internal medicine clinic at [phone].

## 2019-02-24 NOTE — Assessment & Plan Note (Signed)
BP Readings from Last 3 Encounters:  02/24/19 (!) 147/83  10/25/18 130/78  10/01/18 (!) 113/98  Blood pressure today elevated. She takes metoprolol XL 25 MG, lisinopril 10 MG, and lasix 40 MG qd but elevated bp may be secondary to neck pain  - f/u bp check at next appointment  - BMP today

## 2019-02-24 NOTE — Progress Notes (Signed)
Internal Medicine Clinic Attending  Case discussed with Dr. Seawell at the time of the visit.  We reviewed the resident's history and exam and pertinent patient test results.  I agree with the assessment, diagnosis, and plan of care documented in the resident's note.    

## 2019-02-24 NOTE — Assessment & Plan Note (Signed)
She made an appointment with ENT, but it has been cancelled once by her and once by the provider. She will make sure to follow-up with them for her nasal polyp seen on CT.

## 2019-02-24 NOTE — Assessment & Plan Note (Addendum)
She has been taking her medications. Hgb a1c 7.5 from 7.9 10/19. She is taking metformin 1000 MG bid, lantus 22 qhs, and glipizide 10 MG am and 5 MG pm. Recently saw Dr. Elie Confer with optho, and did not have retinopathy.   - cont. Current medications - hemoglobin a1c f/u in 3 months

## 2019-02-25 ENCOUNTER — Telehealth: Payer: Self-pay | Admitting: *Deleted

## 2019-02-25 LAB — BMP8+ANION GAP
Anion Gap: 16 mmol/L (ref 10.0–18.0)
BUN/Creatinine Ratio: 18 (ref 12–28)
BUN: 14 mg/dL (ref 8–27)
CO2: 23 mmol/L (ref 20–29)
Calcium: 10.2 mg/dL (ref 8.7–10.3)
Chloride: 102 mmol/L (ref 96–106)
Creatinine, Ser: 0.78 mg/dL (ref 0.57–1.00)
GFR calc Af Amer: 90 mL/min/{1.73_m2} (ref >59)
GFR, EST NON AFRICAN AMERICAN: 78 mL/min/{1.73_m2} (ref >59)
Glucose: 114 mg/dL — ABNORMAL HIGH (ref 65–99)
Potassium: 4.3 mmol/L (ref 3.5–5.2)
Sodium: 141 mmol/L (ref 134–144)

## 2019-02-25 NOTE — Telephone Encounter (Signed)
CALLED PATIENT LVM FOR HER TO CALL [facility] REGARDING HER [facility] REFERRAL.

## 2019-02-26 ENCOUNTER — Other Ambulatory Visit: Payer: Self-pay | Admitting: Internal Medicine

## 2019-02-26 DIAGNOSIS — E1165 Type 2 diabetes mellitus with hyperglycemia: Principal | ICD-10-CM

## 2019-02-26 DIAGNOSIS — IMO0001 Reserved for inherently not codable concepts without codable children: Secondary | ICD-10-CM

## 2019-02-26 MED FILL — glipiZIDE 5 MG TABS: 5 | 30 days supply | Qty: 60 | Fill #2

## 2019-02-26 MED FILL — JANUVIA 100 MG TABLET: 100 | 90 days supply | Qty: 90 | Fill #0

## 2019-03-03 MED FILL — LANTUS SOLOSTAR 100 UNITS/M: 100 | 68 days supply | Qty: 15 | Fill #1

## 2019-03-04 ENCOUNTER — Other Ambulatory Visit: Payer: Self-pay

## 2019-03-04 DIAGNOSIS — I10 Essential (primary) hypertension: Secondary | ICD-10-CM

## 2019-03-04 NOTE — Telephone Encounter (Signed)
furosemide (LASIX) 40 MG tablet, refill request @ [facility].

## 2019-03-04 NOTE — Telephone Encounter (Signed)
Is mailing meds and it will take 7 to 14 days. wlong

## 2019-03-04 NOTE — Telephone Encounter (Signed)
Pt has script at pharm, tried to call her to let her know pharm

## 2019-03-04 NOTE — Telephone Encounter (Signed)
Patient returned call. Explained she would receive refill within next 7-14 days. She states she only has 3 days left of med. E-mail sent to [email or web address] to see if patient would be able to obtain refill sooner. Hubbard Hartshorn, RN, BSN

## 2019-03-05 ENCOUNTER — Other Ambulatory Visit: Payer: Self-pay | Admitting: Internal Medicine

## 2019-03-05 MED ORDER — FUROSEMIDE 40 MG PO TABS: 40.0000 mg | ORAL_TABLET | Freq: Every day | ORAL | 0 refills | Status: DC

## 2019-03-05 MED FILL — FUROSEMIDE 40 MG TAB: 40 | 90 days supply | Qty: 90 | Fill #0

## 2019-03-05 NOTE — Addendum Note (Signed)
Addended by: Molli Hazard A on: 03/05/2019 03:53 PM   Modules accepted: Orders

## 2019-03-05 NOTE — Addendum Note (Signed)
Addended by: Velora Heckler on: 03/05/2019 03:48 PM   Modules accepted: Orders

## 2019-03-05 NOTE — Telephone Encounter (Signed)
Received e-mail from [facility] at [facility] that patient does not have an avtive Rx for furosemide. Per phone call with patient yesterday she only had 3 tabs left.

## 2019-03-13 MED FILL — METHOCARBAMOL 500 MG TABLET: 500 | 14 days supply | Qty: 40 | Fill #0

## 2019-04-02 ENCOUNTER — Other Ambulatory Visit: Payer: Self-pay | Admitting: Internal Medicine

## 2019-04-02 DIAGNOSIS — IMO0001 Reserved for inherently not codable concepts without codable children: Secondary | ICD-10-CM

## 2019-04-02 DIAGNOSIS — E1165 Type 2 diabetes mellitus with hyperglycemia: Principal | ICD-10-CM

## 2019-04-03 MED ORDER — GLIPIZIDE 10 MG PO TABS: ORAL_TABLET | ORAL | 1 refills | Status: DC

## 2019-04-03 MED FILL — glipiZIDE 10 MG TABS: 10 | 30 days supply | Qty: 45 | Fill #0 | Status: TO

## 2019-04-03 NOTE — Telephone Encounter (Signed)
glipiZIDE (GLUCOTROL) 5 MG tablet, refill request @  [place], [place] - [address] [phone] (Phone) [phone] (Fax)  Pt requesting the med to be for 90 days supply.

## 2019-04-18 MED FILL — FLUTICASONE PROP 50 MCG SPR: 50 | 30 days supply | Qty: 16 | Fill #0

## 2019-04-21 MED FILL — DICLOFENAC SODIUM 1 % GEL: 1 | 13 days supply | Qty: 100 | Fill #0

## 2019-04-21 MED FILL — metFORMIN HCL 1000 MG TABS: 1000 | 90 days supply | Qty: 180 | Fill #0

## 2019-04-22 ENCOUNTER — Other Ambulatory Visit: Payer: Self-pay | Admitting: Internal Medicine

## 2019-04-22 DIAGNOSIS — I10 Essential (primary) hypertension: Secondary | ICD-10-CM

## 2019-04-23 MED FILL — LISINOPRIL 10 MG TABLET: 10 | 90 days supply | Qty: 90 | Fill #0

## 2019-04-25 ENCOUNTER — Other Ambulatory Visit: Payer: Self-pay | Admitting: Internal Medicine

## 2019-04-25 DIAGNOSIS — I10 Essential (primary) hypertension: Secondary | ICD-10-CM

## 2019-04-25 MED FILL — ACCU-CHEK GUIDE TEST STRIP: 75 days supply | Qty: 300 | Fill #0

## 2019-04-25 MED FILL — METOPROLOL SUCCINATE ER 25: 25 | 90 days supply | Qty: 90 | Fill #0

## 2019-04-25 MED FILL — ACCU-CHEK FASTCLIX LANCETS: 51 days supply | Qty: 102 | Fill #0

## 2019-05-09 ENCOUNTER — Other Ambulatory Visit: Payer: Self-pay | Admitting: Internal Medicine

## 2019-05-09 DIAGNOSIS — IMO0001 Reserved for inherently not codable concepts without codable children: Secondary | ICD-10-CM

## 2019-05-09 MED FILL — glipiZIDE 10 MG TABS: 10 | 30 days supply | Qty: 45 | Fill #0

## 2019-05-12 MED FILL — LANTUS SOLOSTAR 100 UNITS/M: 100 | 68 days supply | Qty: 15 | Fill #2

## 2019-05-13 ENCOUNTER — Telehealth: Payer: Self-pay | Admitting: Dietician

## 2019-05-13 DIAGNOSIS — IMO0001 Reserved for inherently not codable concepts without codable children: Secondary | ICD-10-CM

## 2019-05-13 NOTE — Telephone Encounter (Signed)
patient requested an appointment for CGM placement.

## 2019-05-14 ENCOUNTER — Ambulatory Visit: Payer: No Typology Code available for payment source | Admitting: Dietician

## 2019-05-15 ENCOUNTER — Ambulatory Visit (INDEPENDENT_AMBULATORY_CARE_PROVIDER_SITE_OTHER): Payer: No Typology Code available for payment source | Admitting: Dietician

## 2019-05-15 ENCOUNTER — Other Ambulatory Visit: Payer: Self-pay

## 2019-05-15 DIAGNOSIS — E118 Type 2 diabetes mellitus with unspecified complications: Secondary | ICD-10-CM | POA: Diagnosis not present

## 2019-05-15 DIAGNOSIS — Z713 Dietary counseling and surveillance: Secondary | ICD-10-CM

## 2019-05-15 DIAGNOSIS — IMO0001 Reserved for inherently not codable concepts without codable children: Secondary | ICD-10-CM

## 2019-05-15 NOTE — Patient Instructions (Addendum)
Please record the time, amount and what food drinks and activities you have while wearing the continuous glucose monitor (CGM).  Bring the folder with you to follow up appointments. If your monitor falls off, please place it in the bag provided in your folder and bring it back with you to your next appointment.   Do not have a CT or an MRI while wearing the CGM.   1 week visit has been set up with me and a doctor for the first of two CGM downloads.   You will also return in 2 weeks to have your second download and the CGM removed.  [place], RD 05/15/2019   [phone] 3:39 PM

## 2019-05-15 NOTE — Telephone Encounter (Signed)
That sounds fine. We can get an A1C when she comes in. Do I need to place an order or anything?

## 2019-05-15 NOTE — Progress Notes (Signed)
Documentation for Freestyle Libre Pro Continuous glucose monitoring Freestyle Libre Pro CGM sensor placed today. Patient was educated about wearing sensor, keeping food, activity and medication log and when to call office. Patient was educated about how to care for the sensor and not to have an MRI, CT or Diathermy while wearing the sensor. Follow up was arranged with the patient for 1 week.   Lot #: [id number] Serial #: [id number] Expiration Date: 09/10/2019  Debera Lat, RD 05/15/2019 3:44 PM.

## 2019-05-22 ENCOUNTER — Encounter: Payer: Self-pay | Admitting: Internal Medicine

## 2019-05-22 ENCOUNTER — Other Ambulatory Visit: Payer: Self-pay

## 2019-05-22 ENCOUNTER — Ambulatory Visit: Payer: No Typology Code available for payment source | Admitting: Dietician

## 2019-05-22 ENCOUNTER — Encounter: Payer: Self-pay | Admitting: Dietician

## 2019-05-22 ENCOUNTER — Ambulatory Visit (INDEPENDENT_AMBULATORY_CARE_PROVIDER_SITE_OTHER): Payer: No Typology Code available for payment source | Admitting: Internal Medicine

## 2019-05-22 VITALS — BP 133/84 | HR 93 | Ht 64.0 in | Wt 182.4 lb

## 2019-05-22 DIAGNOSIS — E119 Type 2 diabetes mellitus without complications: Secondary | ICD-10-CM | POA: Diagnosis not present

## 2019-05-22 DIAGNOSIS — IMO0001 Reserved for inherently not codable concepts without codable children: Secondary | ICD-10-CM

## 2019-05-22 LAB — POCT GLYCOSYLATED HEMOGLOBIN (HGB A1C): Hemoglobin A1C: 7.7 % — AB (ref 4.0–5.6)

## 2019-05-22 NOTE — Progress Notes (Signed)
   CC: CGM download  HPI:  [name] is a 68 y.o. F with type 2 DM, HTN presenting for first CMG download.    Past Medical History:  Diagnosis Date  . Carpal tunnel syndrome   . Diabetes mellitus    Type II- diagnosed 30 yrs. ago  . Family history of adverse reaction to anesthesia    20 YRS. AGO, HER 20 YR. OLD NEPHEW, ENDED UP ON VENTILATOR- PT. THINKS ITS BECAUSE HE ASPIRATED   . Hypercalcemia   . Hyperlipidemia   . Hypertension   . Osteopenia   . Rotator cuff syndrome of right shoulder     Physical Exam:  Vitals:   05/22/19 1529  BP: 133/84  Pulse: 93  SpO2: 100%  Weight: 182 lb 6.4 oz (82.7 kg)  Height: 5\' 4"  (1.626 m)    Assessment & Plan:   See Encounters Tab for problem based charting.  Patient discussed with Dr. Evette Doffing

## 2019-05-22 NOTE — Patient Instructions (Addendum)
Sunday Spillers,  Good job on your blood sugar control!! 71% in target between 70-180!!!  You should get a call from Dessie Coma, our counselor.   Please make a follow up appointment for next week for the final download and to remove the sensor.   When you call the [employer] ask them if   95250 and  95251 and  [phone], [phone]   are covered services and what your copay is for these services is.   [name] [phone]

## 2019-05-22 NOTE — Patient Instructions (Signed)
Keep up the good work! Your a1c today was 7.7 (last time it was 7.5). Our goal for you is to get the a1c to be less than 7.0. Continue taking all medicines like you are and work on [employer].   Please return in 7 days with Michele Andrews for Download #2.

## 2019-05-22 NOTE — Progress Notes (Signed)
CGM download:  CGM Results from Download #1 Average is  158  for 8 days Time in range (70-180 mg/dL): 71% (Goal >70%) Time very high: (>250 mg/dl): 0% Time High (181-250 mg/dL) 28 % Time below range (<70 mg/dL): 1 % (Goal is <3%) Coefficient of variation:25.8 % (Goal is <36%)  Glucose Management Indicator(GMI): 7.1%  Blood sugars well controlled but she wants them better. She has been trying to decrease her carb intake. She did nto keep a food/activity records. Eats eggs, strawberries, peach,  Forgets to take lantus at times, had low blood sugar last night so skipped it intentionally. She is having problems with anxiety. Requests referral to our counselor.   Most problems with glyemic control are when she eats. Low carb foods are helping. Consider weekly incretin mimetic. Care coordinated with Dr. Donne Hazel.  Plan: Suggestions made for low carb foods,follow up in 1 week for final download Debera Lat, RD 05/22/2019 5:27 PM.

## 2019-05-22 NOTE — Assessment & Plan Note (Addendum)
Reports compliance with glipizide 10mg  qAM and 5mg  qPM, metformin 1000mg  bid, januvia 100mg  qd, lantus 22U qhs. She reports one symptomatic hypoglycemic episode which improved with food intake. She is working to reduce carb intake.   Rich Number wore the CGM for 7 days. The average reading was 158, % time in target was 71, % time below target was 1, and % time above target was. 28. Intervention will be to decrease carb intake, specifically french fries, potatoes, bread. The patient will be scheduled to see MD and [name] for a final appointment.

## 2019-05-23 LAB — GLUCOSE, CAPILLARY: Glucose-Capillary: 114 mg/dL — ABNORMAL HIGH (ref 70–99)

## 2019-05-23 NOTE — Progress Notes (Signed)
Internal Medicine Clinic Attending  Case discussed with Dr. Donne Hazel at the time of the visit. We reviewed the resident's history and exam and pertinent patient test results. I personally reviewed the CGM data & the resident's interpretation. I agree with the assessment, diagnosis, and plan of care documented in the resident's note.   Patient with diabetes on long acting insulin. CGM shows daily post-prandial hyperglycemia especially after lunch and occasionally after dinner. We discussed with her mealtime changes that may reduce the glycemic loading of those meals. If this continues, we should add mealtime short-acting insulin coverage. Her fasting glucose is fine, so no need to increase lantus. At this point in her disease course, I doubt the sulfonylurea is adding much and could be discontinued in the future.

## 2019-05-28 ENCOUNTER — Telehealth: Payer: Self-pay | Admitting: Licensed Clinical Social Worker

## 2019-05-28 NOTE — Telephone Encounter (Signed)
Patient was contacted due to a referral I received for services. Patient agreed to services, and requested a phone session. Patient will be added to my schedule for 6/24 at 3:00.

## 2019-05-29 ENCOUNTER — Ambulatory Visit (INDEPENDENT_AMBULATORY_CARE_PROVIDER_SITE_OTHER): Payer: No Typology Code available for payment source | Admitting: Internal Medicine

## 2019-05-29 ENCOUNTER — Other Ambulatory Visit: Payer: Self-pay

## 2019-05-29 DIAGNOSIS — Z794 Long term (current) use of insulin: Secondary | ICD-10-CM | POA: Diagnosis not present

## 2019-05-29 DIAGNOSIS — I10 Essential (primary) hypertension: Secondary | ICD-10-CM

## 2019-05-29 DIAGNOSIS — E785 Hyperlipidemia, unspecified: Secondary | ICD-10-CM | POA: Diagnosis not present

## 2019-05-29 DIAGNOSIS — E118 Type 2 diabetes mellitus with unspecified complications: Secondary | ICD-10-CM

## 2019-05-29 DIAGNOSIS — IMO0001 Reserved for inherently not codable concepts without codable children: Secondary | ICD-10-CM

## 2019-05-29 MED FILL — JANUVIA 100 MG TABLET: 100 | 90 days supply | Qty: 90 | Fill #1

## 2019-05-29 NOTE — Assessment & Plan Note (Signed)
Michele Andrews returns today for her two-week CGM follow-up visit.  She wore the CGM for 15 days. The average reading was 149, % time in target was 78, % time below target was 1, and % time above target was 20.  She does appear to have postprandial hyperglycemic events often.  At her last clinic visit plan was to decrease her carbohydrate intake but she states that she has not done a good job with this and is often snacking on candy and chips.  We rediscussed decreasing carbohydrate intake at mealtime and increasing healthy snacks including vegetables.  She expressed understanding and agreement.  I have written a work note so that she can have healthy snacks.  She is to check her blood sugar 3-4 times per day and bring her glucometer at her next clinic visit in 1 month.  If her she continues to have postprandial hyperglycemia will plan on adding mealtime insulin.  Continue Lantus 20 units nightly for now.

## 2019-05-29 NOTE — Patient Instructions (Signed)
Please check your blood sugar 3 times per day and bring your glucometer to your next clinic visit.

## 2019-05-29 NOTE — Progress Notes (Signed)
   CC: CGM follow-up  HPI:  [name] is a 68 y.o. female with type 2 diabetes, hyperlipidemia, and hypertension who presents for CGM follow-up.  Please see problem based charting for assessment and plan.  Past Medical History:  Diagnosis Date  . Carpal tunnel syndrome   . Diabetes mellitus    Type II- diagnosed 30 yrs. ago  . Family history of adverse reaction to anesthesia    20 YRS. AGO, HER 20 YR. OLD NEPHEW, ENDED UP ON VENTILATOR- PT. THINKS ITS BECAUSE HE ASPIRATED   . Hypercalcemia   . Hyperlipidemia   . Hypertension   . Osteopenia   . Rotator cuff syndrome of right shoulder    Review of Systems:   Review of Systems  Constitutional: Negative for chills and fever.  HENT: Negative for congestion.   Eyes: Negative for blurred vision and photophobia.  Respiratory: Negative for cough and shortness of breath.   Gastrointestinal: Negative for heartburn and nausea.  Genitourinary: Negative for dysuria, frequency, hematuria and urgency.  Neurological: Negative for dizziness and headaches.  Psychiatric/Behavioral: Negative for depression, hallucinations and substance abuse.    Physical Exam:  Vitals:   05/29/19 1529  BP: 111/74  Pulse: 93  SpO2: 100%  Weight: 180 lb 6.4 oz (81.8 kg)  Height: 5\' 4"  (1.626 m)   Physical Exam Vitals signs reviewed.  Constitutional:      Appearance: Normal appearance.  HENT:     Head: Normocephalic and atraumatic.  Cardiovascular:     Rate and Rhythm: Normal rate and regular rhythm.  Pulmonary:     Effort: Pulmonary effort is normal. No respiratory distress.     Breath sounds: Normal breath sounds.  Abdominal:     General: Abdomen is flat. Bowel sounds are normal.     Palpations: Abdomen is soft.  Skin:    General: Skin is warm and dry.  Neurological:     Mental Status: She is alert.  Psychiatric:        Mood and Affect: Mood normal.        Behavior: Behavior normal.     Assessment & Plan:   See Encounters Tab  for problem based charting.  Patient discussed with Dr. Daryll Drown

## 2019-06-02 ENCOUNTER — Other Ambulatory Visit: Payer: Self-pay | Admitting: Internal Medicine

## 2019-06-02 DIAGNOSIS — I10 Essential (primary) hypertension: Secondary | ICD-10-CM

## 2019-06-03 NOTE — Progress Notes (Signed)
Internal Medicine Clinic Attending  Case discussed with Dr. Prince at the time of the visit.  We reviewed the resident's history and exam and pertinent patient test results.  I agree with the assessment, diagnosis, and plan of care documented in the resident's note.   

## 2019-06-04 ENCOUNTER — Encounter: Payer: Self-pay | Admitting: Licensed Clinical Social Worker

## 2019-06-04 ENCOUNTER — Ambulatory Visit (INDEPENDENT_AMBULATORY_CARE_PROVIDER_SITE_OTHER): Payer: No Typology Code available for payment source | Admitting: Licensed Clinical Social Worker

## 2019-06-04 DIAGNOSIS — Z638 Other specified problems related to primary support group: Secondary | ICD-10-CM

## 2019-06-04 MED FILL — FUROSEMIDE 40 MG TAB: 40 | 90 days supply | Qty: 90 | Fill #0

## 2019-06-04 NOTE — BH Specialist Note (Signed)
Integrated Behavioral Health Visit via Telemedicine (Telephone)  06/04/2019 Michele Andrews [id number]   Session Start time: 3:05  Session End time: 3:15 Total time: 10 minutes  Referring Provider: [name] Type of Visit: Telephonic Patient location: Home [facility] Provider location: Remote All persons participating in visit: patient, [facility], and [facility] intern  Confirmed patient's address: Yes  Confirmed patient's phone number: Yes  Any changes to demographics: No   Discussed confidentiality: Yes    The following statements were read to the patient and/or legal guardian that are established with the [facility] Provider.  "The purpose of this phone visit is to provide behavioral health care while limiting exposure to the coronavirus (COVID19).  There is a possibility of technology failure and discussed alternative modes of communication if that failure occurs."  "By engaging in this telephone visit, you consent to the provision of healthcare.  Additionally, you authorize for your insurance to be billed for the services provided during this telephone visit."   Patient and/or legal guardian consented to telephone visit: Yes   PRESENTING CONCERNS: Patient and/or family reports the following symptoms/concerns: interpersonal relationship issues and stress.  Duration of problem: not identified Severity of problem: mild   GOALS ADDRESSED: Patient will: 1.  Reduce symptoms of: depression and stress  2.  Increase knowledge and/or ability of: coping skills, healthy habits and stress reduction  3.  Demonstrate ability to: Increase healthy adjustment to current life circumstances and Increase adequate support systems for patient/family  INTERVENTIONS: Interventions utilized:  Motivational Interviewing, Mindfulness or Relaxation Training and Supportive Counseling Standardized Assessments completed: assessed for SI, HI, and self-harm.  ASSESSMENT: Patient currently experiencing interpersonal  relationship issues with her family. Patient reported that she feels she is one of the only individuals that actively assist her parents with their needs.  Patient wants to establish healthy limits with her parents, but is unsure how to do so. Patient reported she wants to increase self-care. Patient had to end the session early.   Patient may benefit from weekly outpatient therapy.  PLAN: 1. Follow up with behavioral health clinician on : one week via telephone.   Dessie Coma, [facility], [place]

## 2019-06-05 ENCOUNTER — Other Ambulatory Visit: Payer: Self-pay

## 2019-06-09 ENCOUNTER — Other Ambulatory Visit: Payer: Self-pay | Admitting: Internal Medicine

## 2019-06-09 DIAGNOSIS — IMO0001 Reserved for inherently not codable concepts without codable children: Secondary | ICD-10-CM

## 2019-06-09 MED FILL — glipiZIDE 10 MG TABS: 10 | 30 days supply | Qty: 45 | Fill #1

## 2019-06-27 ENCOUNTER — Other Ambulatory Visit: Payer: Self-pay | Admitting: Internal Medicine

## 2019-06-27 DIAGNOSIS — M25512 Pain in left shoulder: Secondary | ICD-10-CM

## 2019-06-27 MED FILL — IBUPROFEN 800 MG TABS: 800 | 10 days supply | Qty: 30 | Fill #0

## 2019-07-08 MED FILL — glipiZIDE 10 MG TABS: 10 | 90 days supply | Qty: 135 | Fill #0

## 2019-07-23 ENCOUNTER — Other Ambulatory Visit: Payer: Self-pay | Admitting: Internal Medicine

## 2019-07-23 DIAGNOSIS — IMO0001 Reserved for inherently not codable concepts without codable children: Secondary | ICD-10-CM

## 2019-07-23 MED FILL — LISINOPRIL 10 MG TABS: 10 | 90 days supply | Qty: 90 | Fill #0

## 2019-07-23 MED FILL — LANTUS SOLOSTAR 100 UNITS/M: 100 | 68 days supply | Qty: 15 | Fill #3

## 2019-07-23 MED FILL — METOPROLOL SUCCINATE ER 25: 25 | 90 days supply | Qty: 90 | Fill #0

## 2019-07-24 MED FILL — metFORMIN HCL 1000 MG TABS: 1000 | 90 days supply | Qty: 180 | Fill #0

## 2019-08-26 ENCOUNTER — Other Ambulatory Visit: Payer: Self-pay | Admitting: Internal Medicine

## 2019-08-26 DIAGNOSIS — IMO0001 Reserved for inherently not codable concepts without codable children: Secondary | ICD-10-CM

## 2019-08-27 MED FILL — JANUVIA 100 MG TABLET: 100 | 90 days supply | Qty: 90 | Fill #0

## 2019-09-02 ENCOUNTER — Other Ambulatory Visit: Payer: Self-pay | Admitting: Internal Medicine

## 2019-09-02 DIAGNOSIS — I10 Essential (primary) hypertension: Secondary | ICD-10-CM

## 2019-09-02 MED FILL — DICLOFENAC SODIUM 1 % GEL: 1 | 13 days supply | Qty: 100 | Fill #0

## 2019-09-02 MED FILL — FUROSEMIDE 40 MG TAB: 40 | 90 days supply | Qty: 90 | Fill #0

## 2019-09-30 ENCOUNTER — Other Ambulatory Visit: Payer: Self-pay | Admitting: Internal Medicine

## 2019-09-30 DIAGNOSIS — M1712 Unilateral primary osteoarthritis, left knee: Secondary | ICD-10-CM

## 2019-09-30 MED FILL — glipiZIDE 10 MG TABS: 10 | 90 days supply | Qty: 135 | Fill #1

## 2019-10-01 MED FILL — DICLOFENAC SODIUM 1 % GEL: 1 | 8 days supply | Qty: 100 | Fill #0

## 2019-10-01 MED FILL — LANTUS SOLOSTAR 100 UNITS/M: 100 | 68 days supply | Qty: 15 | Fill #0

## 2019-10-03 ENCOUNTER — Other Ambulatory Visit: Payer: Self-pay | Admitting: Internal Medicine

## 2019-10-03 DIAGNOSIS — M542 Cervicalgia: Secondary | ICD-10-CM

## 2019-10-06 MED FILL — IBUPROFEN 800 MG TABS: 800 | 10 days supply | Qty: 30 | Fill #1

## 2019-10-08 NOTE — Telephone Encounter (Signed)
Per Epic pt has an appt 11/4 with PCP.

## 2019-10-15 ENCOUNTER — Encounter: Payer: Self-pay | Admitting: Internal Medicine

## 2019-10-15 ENCOUNTER — Ambulatory Visit (INDEPENDENT_AMBULATORY_CARE_PROVIDER_SITE_OTHER): Payer: No Typology Code available for payment source | Admitting: Internal Medicine

## 2019-10-15 ENCOUNTER — Other Ambulatory Visit: Payer: Self-pay

## 2019-10-15 VITALS — BP 131/89 | HR 104 | Temp 98.4°F | Wt 177.5 lb

## 2019-10-15 DIAGNOSIS — I10 Essential (primary) hypertension: Secondary | ICD-10-CM

## 2019-10-15 DIAGNOSIS — M79672 Pain in left foot: Secondary | ICD-10-CM

## 2019-10-15 DIAGNOSIS — Z794 Long term (current) use of insulin: Secondary | ICD-10-CM

## 2019-10-15 DIAGNOSIS — E890 Postprocedural hypothyroidism: Secondary | ICD-10-CM

## 2019-10-15 DIAGNOSIS — E785 Hyperlipidemia, unspecified: Secondary | ICD-10-CM

## 2019-10-15 DIAGNOSIS — L659 Nonscarring hair loss, unspecified: Secondary | ICD-10-CM | POA: Diagnosis not present

## 2019-10-15 DIAGNOSIS — E119 Type 2 diabetes mellitus without complications: Secondary | ICD-10-CM

## 2019-10-15 DIAGNOSIS — Z1211 Encounter for screening for malignant neoplasm of colon: Secondary | ICD-10-CM

## 2019-10-15 DIAGNOSIS — M722 Plantar fascial fibromatosis: Secondary | ICD-10-CM

## 2019-10-15 DIAGNOSIS — Z79899 Other long term (current) drug therapy: Secondary | ICD-10-CM

## 2019-10-15 DIAGNOSIS — Z0000 Encounter for general adult medical examination without abnormal findings: Secondary | ICD-10-CM

## 2019-10-15 LAB — POCT GLYCOSYLATED HEMOGLOBIN (HGB A1C): Hemoglobin A1C: 7.7 % — AB (ref 4.0–5.6)

## 2019-10-15 LAB — GLUCOSE, CAPILLARY: Glucose-Capillary: 109 mg/dL — ABNORMAL HIGH (ref 70–99)

## 2019-10-15 MED ORDER — METOPROLOL SUCCINATE ER 25 MG PO TB24: ORAL_TABLET | ORAL | 1 refills | Status: DC

## 2019-10-15 MED ORDER — GLIPIZIDE 10 MG PO TABS: ORAL_TABLET | ORAL | 1 refills | Status: DC

## 2019-10-15 MED ORDER — FUROSEMIDE 40 MG PO TABS: 40.0000 mg | ORAL_TABLET | Freq: Every day | ORAL | 1 refills | Status: DC

## 2019-10-15 MED ORDER — SITAGLIPTIN PHOSPHATE 100 MG PO TABS: ORAL_TABLET | ORAL | 1 refills | Status: DC

## 2019-10-15 MED ORDER — LANTUS SOLOSTAR 100 UNIT/ML ~~LOC~~ SOPN: PEN_INJECTOR | SUBCUTANEOUS | 3 refills | Status: DC

## 2019-10-15 MED ORDER — METFORMIN HCL 1000 MG PO TABS: ORAL_TABLET | ORAL | 1 refills | Status: DC

## 2019-10-15 MED ORDER — ATORVASTATIN CALCIUM 40 MG PO TABS: 40.0000 mg | ORAL_TABLET | Freq: Every day | ORAL | 3 refills | Status: DC

## 2019-10-15 MED ORDER — LISINOPRIL 10 MG PO TABS: 10.0000 mg | ORAL_TABLET | Freq: Every day | ORAL | 1 refills | Status: DC

## 2019-10-15 MED FILL — ATORVASTATIN 40 MG TABLET: 40 | 90 days supply | Qty: 90 | Fill #0

## 2019-10-15 MED FILL — metFORMIN HCL 1000 MG TABS: 1000 | 90 days supply | Qty: 180 | Fill #0

## 2019-10-15 MED FILL — LISINOPRIL 10 MG TABS: 10 | 90 days supply | Qty: 90 | Fill #0

## 2019-10-15 MED FILL — METOPROLOL SUCCINATE ER 25: 25 | 90 days supply | Qty: 90 | Fill #0

## 2019-10-15 NOTE — Patient Instructions (Signed)
Thank you for allowing [place] to provide your care today. Today we discussed your left foot pain, type II diabetes and hair loss   I have ordered the following labs for you:  CMP, TSH, CBC, and iron panel    I will call if any are abnormal.    Today we made the following changes to your medications:   Please follow-up in three months or earlier if needed.    Please call the internal medicine center clinic if you have any questions or concerns, we may be able to help and keep you from a long and expensive emergency room wait. Our clinic and after hours phone number is [phone], the best time to call is Monday through Friday 9 am to 4 pm but there is always someone available 24/7 if you have an emergency. If you need medication refills please notify your pharmacy one week in advance and they will send [place] a request.

## 2019-10-15 NOTE — Progress Notes (Signed)
   CC: TIIDM  HPI:  [name] is a 68 y.o. with PMH as below.   Please see A&P for assessment of the patient's acute and chronic medical conditions.   Past Medical History:  Diagnosis Date  . Carpal tunnel syndrome   . Diabetes mellitus    Type II- diagnosed 30 yrs. ago  . Family history of adverse reaction to anesthesia    20 YRS. AGO, HER 20 YR. OLD NEPHEW, ENDED UP ON VENTILATOR- PT. THINKS ITS BECAUSE HE ASPIRATED   . Hypercalcemia   . Hyperlipidemia   . Hypertension   . Osteopenia   . Rotator cuff syndrome of right shoulder    Review of Systems:   Review of Systems  Constitutional: Negative for chills and fever.  Respiratory: Negative for cough, shortness of breath and wheezing.   Cardiovascular: Negative for chest pain, palpitations and leg swelling.  Gastrointestinal: Negative for abdominal pain, constipation and diarrhea.  Genitourinary: Negative for dysuria, frequency and urgency.  Psychiatric/Behavioral: Negative for depression. The patient is not nervous/anxious.     Physical Exam:  Constitution: NAD appears stated age Cardio: RRR, no m/r/g; on LE edema Respiratory: CTA, no wheezing rales rhonchi  Neuro: a&o, normal affect Skin: c/d/i    There were no vitals filed for this visit.   Assessment & Plan:   See Encounters Tab for problem based charting.  Patient discussed with Dr. Dareen Piano

## 2019-10-16 ENCOUNTER — Other Ambulatory Visit: Payer: No Typology Code available for payment source

## 2019-10-16 DIAGNOSIS — Z1211 Encounter for screening for malignant neoplasm of colon: Secondary | ICD-10-CM

## 2019-10-16 DIAGNOSIS — L659 Nonscarring hair loss, unspecified: Secondary | ICD-10-CM | POA: Insufficient documentation

## 2019-10-16 LAB — CBC
Hematocrit: 37.7 % (ref 34.0–46.6)
Hemoglobin: 12.4 g/dL (ref 11.1–15.9)
MCH: 30.4 pg (ref 26.6–33.0)
MCHC: 32.9 g/dL (ref 31.5–35.7)
MCV: 92 fL (ref 79–97)
Platelets: 224 10*3/uL (ref 150–450)
RBC: 4.08 x10E6/uL (ref 3.77–5.28)
RDW: 12.9 % (ref 11.7–15.4)
WBC: 8.4 10*3/uL (ref 3.4–10.8)

## 2019-10-16 LAB — CMP14 + ANION GAP
ALT: 20 IU/L (ref 0–32)
AST: 22 IU/L (ref 0–40)
Albumin/Globulin Ratio: 1.2 (ref 1.2–2.2)
Albumin: 4.6 g/dL (ref 3.8–4.8)
Alkaline Phosphatase: 60 IU/L (ref 39–117)
Anion Gap: 17 mmol/L (ref 10.0–18.0)
BUN/Creatinine Ratio: 14 (ref 12–28)
BUN: 13 mg/dL (ref 8–27)
Bilirubin Total: 0.4 mg/dL (ref 0.0–1.2)
CO2: 22 mmol/L (ref 20–29)
Calcium: 10.4 mg/dL — ABNORMAL HIGH (ref 8.7–10.3)
Chloride: 98 mmol/L (ref 96–106)
Creatinine, Ser: 0.9 mg/dL (ref 0.57–1.00)
GFR calc Af Amer: 75 mL/min/{1.73_m2} (ref >59)
GFR calc non Af Amer: 65 mL/min/{1.73_m2} (ref >59)
Globulin, Total: 3.7 g/dL (ref 1.5–4.5)
Glucose: 100 mg/dL — ABNORMAL HIGH (ref 65–99)
Potassium: 4.2 mmol/L (ref 3.5–5.2)
Sodium: 137 mmol/L (ref 134–144)
Total Protein: 8.3 g/dL (ref 6.0–8.5)

## 2019-10-16 LAB — IRON AND TIBC
Iron Saturation: 27 % (ref 15–55)
Iron: 80 ug/dL (ref 27–139)
Total Iron Binding Capacity: 298 ug/dL (ref 250–450)
UIBC: 218 ug/dL (ref 118–369)

## 2019-10-16 LAB — FERRITIN: Ferritin: 90 ng/mL (ref 15–150)

## 2019-10-16 LAB — TSH: TSH: 2.39 u[IU]/mL (ref 0.450–4.500)

## 2019-10-16 MED FILL — FLUTICASONE PROP 50 MCG SPR: 50 | 30 days supply | Qty: 16 | Fill #0

## 2019-10-16 NOTE — Assessment & Plan Note (Signed)
She has already had her flu shot.  - FOBT ordered today

## 2019-10-16 NOTE — Assessment & Plan Note (Signed)
BP Readings from Last 3 Encounters:  10/15/19 131/89  05/29/19 111/74  05/22/19 133/84  Blood pressure controlled on current meds, metop xl 25mg , lisinopril 10 mg qd, and lasix 40 mg qd  - BMP today  - refill metop, lisinopril, and lasix

## 2019-10-16 NOTE — Assessment & Plan Note (Signed)
On lantus 22U qhs, glipizide 10 mg qam and 5 mg qhs, januvia 100 mg qd. Last hgb a1c 7.7. Today is also 7.7. She has not been taking her diabetes medications every evening and thinks she maybe has been missing doses as she cannot remember if she takes them sometimes.   - given pill box to keep track of meds - refill atorvastatin - refill glipizide, januvia  - f/u 3 months for a1c

## 2019-10-16 NOTE — Assessment & Plan Note (Signed)
Her hair has been coming out for the past month. She has a history of parathyroid surgery many years ago but this is only a recent change. No symptoms of thyroid dysfunction. Family hx of hair loss in her grandmother. On physical exam she has bilateral widows peak that has extended further in the past month.   - likely related to genetics, she has had increased stress recently. - TSH, CBC, ferritin

## 2019-10-17 LAB — FECAL OCCULT BLOOD, IMMUNOCHEMICAL: Fecal Occult Bld: NEGATIVE

## 2019-10-23 ENCOUNTER — Other Ambulatory Visit: Payer: Self-pay | Admitting: Internal Medicine

## 2019-10-23 ENCOUNTER — Telehealth: Payer: Self-pay | Admitting: Internal Medicine

## 2019-10-23 ENCOUNTER — Encounter: Payer: Self-pay | Admitting: Internal Medicine

## 2019-10-23 NOTE — Telephone Encounter (Signed)
Pt calling back about Lab results.

## 2019-10-23 NOTE — Telephone Encounter (Signed)
Called pt per dr Sharon Seller, made lab appt for 11/13

## 2019-10-23 NOTE — Telephone Encounter (Signed)
Tried to call back twice, she did not answer. Left a message.

## 2019-10-23 NOTE — Progress Notes (Signed)
Called and left message for patient. With elevated calcium and history of parathyroid adenom and partial thyroidectomy will have her follow-up for PTH.

## 2019-10-24 ENCOUNTER — Other Ambulatory Visit: Payer: Self-pay

## 2019-10-24 ENCOUNTER — Other Ambulatory Visit (INDEPENDENT_AMBULATORY_CARE_PROVIDER_SITE_OTHER): Payer: No Typology Code available for payment source

## 2019-10-24 NOTE — Progress Notes (Signed)
Internal Medicine Clinic Attending  Case discussed with Dr. Seawell at the time of the visit.  We reviewed the resident's history and exam and pertinent patient test results.  I agree with the assessment, diagnosis, and plan of care documented in the resident's note.    

## 2019-10-25 LAB — PTH, INTACT AND CALCIUM
Calcium: 10.4 mg/dL — ABNORMAL HIGH (ref 8.7–10.3)
PTH: 26 pg/mL (ref 15–65)

## 2019-10-27 ENCOUNTER — Encounter: Payer: Self-pay | Admitting: Podiatry

## 2019-10-27 ENCOUNTER — Other Ambulatory Visit: Payer: Self-pay

## 2019-10-27 ENCOUNTER — Ambulatory Visit (INDEPENDENT_AMBULATORY_CARE_PROVIDER_SITE_OTHER): Payer: No Typology Code available for payment source

## 2019-10-27 ENCOUNTER — Ambulatory Visit: Payer: No Typology Code available for payment source | Admitting: Podiatry

## 2019-10-27 VITALS — BP 145/85

## 2019-10-27 DIAGNOSIS — M722 Plantar fascial fibromatosis: Secondary | ICD-10-CM

## 2019-10-27 MED ORDER — DICLOFENAC SODIUM 75 MG PO TBEC: 75.0000 mg | DELAYED_RELEASE_TABLET | Freq: Two times a day (BID) | ORAL | 2 refills | Status: DC

## 2019-10-27 MED FILL — DICLOFENAC SODIUM 75 MG TAB: 75 | 25 days supply | Qty: 50 | Fill #0

## 2019-10-27 NOTE — Patient Instructions (Signed)

## 2019-10-29 NOTE — Progress Notes (Signed)
Subjective:   Patient ID: Michele Andrews, female   DOB: 68 y.o.   MRN: [id number]   HPI Patient states she is had a lot of pain in the bottom of the left heel for several months.  States is been getting gradually worse and making walking difficult and does not remember injury and patient does not smoke likes to be active   Review of Systems  All other systems reviewed and are negative.       Objective:  Physical Exam Vitals signs and nursing note reviewed.  Constitutional:      Appearance: She is well-developed.  Pulmonary:     Effort: Pulmonary effort is normal.  Musculoskeletal: Normal range of motion.  Skin:    General: Skin is warm.  Neurological:     Mental Status: She is alert.     Neurovascular status intact muscle strength adequate range of motion within normal limits with patient found to have exquisite discomfort medial fascial band left at the insertional point tendon calcaneus with inflammation fluid around the medial band.  Patient is found to have good digital perfusion well oriented x3     Assessment:  Acute plantar fasciitis left with inflammation fluid of the medial band     Plan:  H&P reviewed condition and x-ray and today did sterile prep injected the fascia 3 mg Kenalog 5 mg Xylocaine applied fascial brace with instructions on usage and shoe gear modifications physical therapy.  Patient will be seen back to recheck  X-ray indicates her small spur with no indications of stress fracture arthritis

## 2019-10-30 ENCOUNTER — Telehealth: Payer: Self-pay | Admitting: Internal Medicine

## 2019-10-30 NOTE — Telephone Encounter (Signed)
Pt calling regarding results, pls contact [phone]

## 2019-11-05 ENCOUNTER — Other Ambulatory Visit: Payer: Self-pay | Admitting: Internal Medicine

## 2019-11-05 DIAGNOSIS — M1712 Unilateral primary osteoarthritis, left knee: Secondary | ICD-10-CM

## 2019-11-10 ENCOUNTER — Ambulatory Visit: Payer: No Typology Code available for payment source | Admitting: Podiatry

## 2019-11-10 MED FILL — DICLOFENAC SODIUM 1 % GEL: 1 | 13 days supply | Qty: 100 | Fill #0

## 2019-11-26 ENCOUNTER — Other Ambulatory Visit: Payer: Self-pay | Admitting: Internal Medicine

## 2019-11-26 DIAGNOSIS — I10 Essential (primary) hypertension: Secondary | ICD-10-CM

## 2019-11-26 MED FILL — JANUVIA 100 MG TABLET: 100 | 90 days supply | Qty: 90 | Fill #1

## 2019-11-26 MED FILL — LANTUS SOLOSTAR 100 UNITS/M: 100 | 68 days supply | Qty: 15 | Fill #1

## 2019-11-27 ENCOUNTER — Other Ambulatory Visit: Payer: Self-pay | Admitting: Internal Medicine

## 2019-11-27 DIAGNOSIS — I10 Essential (primary) hypertension: Secondary | ICD-10-CM

## 2019-11-27 MED ORDER — FREESTYLE LITE TEST VI STRP: ORAL_STRIP | 12 refills | Status: DC

## 2019-11-27 MED ORDER — FREESTYLE FREEDOM LITE W/DEVICE KIT: 1.0000 | PACK | Freq: Three times a day (TID) | 0 refills | Status: AC

## 2019-11-27 MED FILL — FREESTYLE FREEDOM LITE METE: W/DEVICE | 30 days supply | Qty: 1 | Fill #0

## 2019-11-27 MED FILL — FREESTYLE LITE TEST STRIP: 30 days supply | Qty: 100 | Fill #0

## 2019-11-28 ENCOUNTER — Other Ambulatory Visit: Payer: Self-pay | Admitting: Internal Medicine

## 2019-11-28 ENCOUNTER — Other Ambulatory Visit: Payer: Self-pay | Admitting: *Deleted

## 2019-11-28 DIAGNOSIS — I10 Essential (primary) hypertension: Secondary | ICD-10-CM

## 2019-11-28 MED ORDER — FREESTYLE LANCETS MISC: 1 refills | Status: DC

## 2019-11-28 MED FILL — FREESTYLE LANCETS: 90 days supply | Qty: 300 | Fill #0

## 2019-12-01 MED FILL — FUROSEMIDE 40 MG TAB: 40 | 90 days supply | Qty: 90 | Fill #0

## 2019-12-10 ENCOUNTER — Other Ambulatory Visit: Payer: Self-pay | Admitting: Internal Medicine

## 2019-12-10 DIAGNOSIS — Z794 Long term (current) use of insulin: Secondary | ICD-10-CM

## 2019-12-10 DIAGNOSIS — E119 Type 2 diabetes mellitus without complications: Secondary | ICD-10-CM

## 2019-12-15 ENCOUNTER — Other Ambulatory Visit: Payer: Self-pay | Admitting: Internal Medicine

## 2019-12-15 MED FILL — glipiZIDE 10 MG TABS: 10 | 90 days supply | Qty: 135 | Fill #0

## 2019-12-15 NOTE — Telephone Encounter (Signed)
Spoke w/ erin at [facility], pt has a refill for 3 months being filled today, they will call pt

## 2019-12-15 NOTE — Telephone Encounter (Signed)
REFILL REQUEST  glipiZIDE (GLUCOTROL) 10 MG tablet  [place], [place] - [address] [phone] (Phone) [phone] (Fax)

## 2019-12-26 ENCOUNTER — Ambulatory Visit (INDEPENDENT_AMBULATORY_CARE_PROVIDER_SITE_OTHER): Payer: No Typology Code available for payment source | Admitting: Internal Medicine

## 2019-12-26 ENCOUNTER — Other Ambulatory Visit: Payer: Self-pay

## 2019-12-26 DIAGNOSIS — Z23 Encounter for immunization: Secondary | ICD-10-CM | POA: Diagnosis not present

## 2019-12-26 DIAGNOSIS — W540XXA Bitten by dog, initial encounter: Secondary | ICD-10-CM | POA: Diagnosis not present

## 2019-12-26 DIAGNOSIS — S61451A Open bite of right hand, initial encounter: Secondary | ICD-10-CM | POA: Diagnosis not present

## 2019-12-26 MED ORDER — AMOXICILLIN-POT CLAVULANATE 875-125 MG PO TABS: 1.0000 | ORAL_TABLET | Freq: Two times a day (BID) | ORAL | 0 refills | Status: AC

## 2019-12-26 MED FILL — AMOX-CLAV 875-125 MG TABLET: 875-125 | 5 days supply | Qty: 10 | Fill #0

## 2019-12-26 NOTE — Assessment & Plan Note (Signed)
Presents after a dog bite yesterday evening.  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  States that the dog was supposed to be up but for some reason was not.  She states that the dog bit her on the right hand with a puncture wound located near the right fifth digit at the palmar aspect of the MCP joint.  She states that she has been cleaning the wound out with soap and water and using hydrogen peroxide.  She states that there has not been any discharge other than minimal serosanguineous fluid on the bandage.  Patient states that she has no change in sensation of her hand or change in strength.  She has full active and passive range of motion.  She states that the dog is up-to-date on its rabies vaccines but she cannot remember the last time that she got a tetanus shot.  Our records show that her last tetanus shot was in 2013.  She states that the animal control department has not been notified regarding the bite.  Plan: 1.  Counseled the patient regarding the importance of contacting animal control in order to monitor the dog for signs and symptoms of rabies.  Patient states the dog is up-to-date with its rabies vaccines. 2.  Tdap was administered today in clinic. 3.  Prescribed prophylaxis antibiotics with Augmentin for 5 days.  Counseled her on the importance of cleaning the wound and keeping it covered. 4.  The patient regarding her increased risk for infections due to her history of diabetes.  Stated that if she notices any increased pain, swelling, redness, change in sensation, change in strength, increased drainage that she should immediately go to the emergency department for further evaluation and management. 5.  Asked the patient to follow-up with our clinic on Tuesday so that we can reevaluate her hand.

## 2019-12-26 NOTE — Progress Notes (Signed)
   CC: Dog bite  HPI:  [name] is a 69 y.o. female with a past medical history stated below and presents today for dog bite. Please see problem based assessment and plan for additional details.    Past Medical History:  Diagnosis Date  . Carpal tunnel syndrome   . Diabetes mellitus    Type II- diagnosed 30 yrs. ago  . Family history of adverse reaction to anesthesia    20 YRS. AGO, HER 20 YR. OLD NEPHEW, ENDED UP ON VENTILATOR- PT. THINKS ITS BECAUSE HE ASPIRATED   . Hypercalcemia   . Hyperlipidemia   . Hypertension   . Osteopenia   . Rotator cuff syndrome of right shoulder      Review of Systems: Review of Systems  Constitutional: Negative for chills, fever and malaise/fatigue.  Respiratory: Negative for cough and shortness of breath.   Cardiovascular: Negative for chest pain and leg swelling.  Gastrointestinal: Negative for abdominal pain.  Genitourinary: Negative for dysuria.  Musculoskeletal: Negative for joint pain and myalgias.       Hand pain, erythema, swelling  Skin: Negative for rash.  Neurological: Negative for sensory change, focal weakness, weakness and headaches.     There were no vitals filed for this visit.   Physical Exam: Physical Exam  Constitutional: She is oriented to person, place, and time and well-developed, well-nourished, and in no distress.  HENT:  Head: Normocephalic and atraumatic.  Eyes: EOM are normal.  Cardiovascular: Normal rate, regular rhythm, normal heart sounds and intact distal pulses.  Pulmonary/Chest: Effort normal. No respiratory distress. She exhibits no tenderness.  Abdominal: Soft. She exhibits no distension. There is no abdominal tenderness.  Musculoskeletal:        General: Tenderness and edema (minimal hand edema at the MCP joint) present. Normal range of motion.     Cervical back: Normal range of motion.  Neurological: She is alert and oriented to person, place, and time.  Skin: Skin is warm and dry.  There is erythema (around the puncture wound (see image)).        Assessment & Plan:   See Encounters Tab for problem based charting.  Patient discussed with Dr. Rebeca Alert

## 2019-12-26 NOTE — Patient Instructions (Signed)
Thank you, Ms.Rich Number for allowing [place] to provide your care today. Today we discussed Dog Bite.    I have ordered none labs for you.  I have place a referrals to none.   I have ordered the following tests: none   I have ordered the following medication/changed the following medications:  1. Amoxicillin-clavulanate  875-125 twice daily for 5 days 2. Today we gave you the Tetanus shot  Please follow-up in on Tuesday.     Make sure: 1. Contact Animal Control to monitor the dog for sign and symptoms of rabies 2. Go to the ED for reevaluation and management if you experience any worsening of symptoms in your hand including weakness, change in sensation, worse swelling, redness, drainage.   Should you have any questions or concerns please call the internal medicine clinic at [phone].    Marianna Payment, D.O. [facility]

## 2019-12-29 MED FILL — metFORMIN HCL 1000 MG TABS: 1000 | 90 days supply | Qty: 180 | Fill #1

## 2019-12-31 ENCOUNTER — Other Ambulatory Visit: Payer: Self-pay | Admitting: Internal Medicine

## 2019-12-31 DIAGNOSIS — M25512 Pain in left shoulder: Secondary | ICD-10-CM

## 2019-12-31 MED FILL — IBUPROFEN 800 MG TABS: 800 | 10 days supply | Qty: 30 | Fill #0

## 2020-01-06 NOTE — Progress Notes (Signed)
Internal Medicine Clinic Attending  Case discussed with Dr. Coe at the time of the visit.  We reviewed the resident's history and exam and pertinent patient test results.  I agree with the assessment, diagnosis, and plan of care documented in the resident's note.  Demri Poulton, M.D., Ph.D.  

## 2020-01-27 MED FILL — METOPROLOL SUCCINATE ER 25: 25 | 90 days supply | Qty: 90 | Fill #1

## 2020-01-27 MED FILL — LISINOPRIL 10 MG TABS: 10 | 90 days supply | Qty: 90 | Fill #1

## 2020-02-03 ENCOUNTER — Other Ambulatory Visit: Payer: Self-pay | Admitting: Internal Medicine

## 2020-02-03 DIAGNOSIS — Z1231 Encounter for screening mammogram for malignant neoplasm of breast: Secondary | ICD-10-CM

## 2020-02-11 ENCOUNTER — Other Ambulatory Visit: Payer: Self-pay | Admitting: Internal Medicine

## 2020-02-11 DIAGNOSIS — E119 Type 2 diabetes mellitus without complications: Secondary | ICD-10-CM

## 2020-02-11 MED ORDER — BASAGLAR KWIKPEN 100 UNIT/ML ~~LOC~~ SOPN: 22.0000 [IU] | PEN_INJECTOR | Freq: Every day | SUBCUTANEOUS | 0 refills | Status: DC

## 2020-02-13 MED FILL — BASAGLAR 100 UNIT/ML KWIKPE: 100 | 54 days supply | Qty: 12 | Fill #0

## 2020-02-24 MED FILL — FREESTYLE LITE TEST STRIP: 30 days supply | Qty: 100 | Fill #1

## 2020-02-27 MED FILL — FUROSEMIDE 40 MG TAB: 40 | 90 days supply | Qty: 90 | Fill #1

## 2020-02-27 MED FILL — JANUVIA 100 MG TABLET: 100 | 30 days supply | Qty: 30 | Fill #0

## 2020-03-05 MED FILL — glipiZIDE 10 MG TABS: 10 | 90 days supply | Qty: 135 | Fill #1

## 2020-03-08 ENCOUNTER — Ambulatory Visit
Admission: RE | Admit: 2020-03-08 | Discharge: 2020-03-08 | Disposition: A | Discharge status: Home / Self Care | Payer: No Typology Code available for payment source | Source: Ambulatory Visit | Attending: *Deleted | Admitting: *Deleted

## 2020-03-08 ENCOUNTER — Other Ambulatory Visit: Payer: Self-pay

## 2020-03-08 DIAGNOSIS — Z1231 Encounter for screening mammogram for malignant neoplasm of breast: Secondary | ICD-10-CM

## 2020-03-10 ENCOUNTER — Other Ambulatory Visit: Payer: Self-pay | Admitting: Internal Medicine

## 2020-03-10 ENCOUNTER — Other Ambulatory Visit: Payer: Self-pay | Admitting: *Deleted

## 2020-03-10 DIAGNOSIS — R928 Other abnormal and inconclusive findings on diagnostic imaging of breast: Secondary | ICD-10-CM

## 2020-03-12 ENCOUNTER — Ambulatory Visit
Admission: RE | Admit: 2020-03-12 | Discharge: 2020-03-12 | Disposition: A | Discharge status: Home / Self Care | Payer: No Typology Code available for payment source | Source: Ambulatory Visit | Attending: *Deleted | Admitting: *Deleted

## 2020-03-12 ENCOUNTER — Other Ambulatory Visit: Payer: Self-pay

## 2020-03-12 DIAGNOSIS — R928 Other abnormal and inconclusive findings on diagnostic imaging of breast: Secondary | ICD-10-CM

## 2020-03-16 ENCOUNTER — Telehealth: Payer: Self-pay | Admitting: Dietician

## 2020-03-16 NOTE — Telephone Encounter (Signed)
Patient asked if black seed oil was okay for her to take. I did not find a fact sheet on it in the [place] office of dietary supplements. However, in a review article about it online it said "most of the therapeutic properties of this plant are due to the presence of thymoquinone which is major bioactive component of the essential oil". https://www.henry-hernandez.biz/ says insufficient evidence for effectiveness, likely safe if used as food flavoring, possibly safe if used in medicinal quantities short term and insufficient evidence to use it long term in medical quantities. It also says it may slow blood clotting, lower blood sugar and blood pressure. Will ask Dr. Sharon Seller for her input.  I am happy to share her response and this information with the patient.

## 2020-03-18 ENCOUNTER — Other Ambulatory Visit: Payer: Self-pay | Admitting: Internal Medicine

## 2020-03-18 NOTE — Telephone Encounter (Signed)
My findings were similar to yours. There are a few small studies that show possible efficacy in a few areas but not enough for me judge the actual effectiveness, and I did not read the specific articles. At the same time there are no long term studies to determine adverse affects, but short term doesn't appear to have any from a quick glance.  There also can be some interactions with certain medications, like Beta blockers and it can potentially cause blood thinning, but there was no citation for that.

## 2020-03-19 NOTE — Telephone Encounter (Signed)
Patient notified of findings by phone and information mailed. She asked if she is taking a bet blocker and I told her that toprol xl is a beta blocker. She verbalized understanding.

## 2020-03-29 MED FILL — JANUVIA 100 MG TABLET: 100 | 30 days supply | Qty: 30 | Fill #1

## 2020-03-30 ENCOUNTER — Encounter: Payer: Self-pay | Admitting: Internal Medicine

## 2020-03-30 ENCOUNTER — Ambulatory Visit (INDEPENDENT_AMBULATORY_CARE_PROVIDER_SITE_OTHER): Payer: No Typology Code available for payment source | Admitting: Internal Medicine

## 2020-03-30 VITALS — BP 139/74 | HR 89 | Wt 179.7 lb

## 2020-03-30 DIAGNOSIS — S41012D Laceration without foreign body of left shoulder, subsequent encounter: Secondary | ICD-10-CM | POA: Diagnosis not present

## 2020-03-30 DIAGNOSIS — S41012A Laceration without foreign body of left shoulder, initial encounter: Secondary | ICD-10-CM | POA: Insufficient documentation

## 2020-03-30 DIAGNOSIS — W19XXXD Unspecified fall, subsequent encounter: Secondary | ICD-10-CM | POA: Diagnosis not present

## 2020-03-30 NOTE — Assessment & Plan Note (Signed)
She got tangled up with her puppy's leash on Thursday.  She is not sure how she fell, did not hit her head but fell on her left side.  No joint pain or weakness but she is concerned about a scratch that occurred on an end table near where she fell.  No joint pain, normal range of motion and strength.  Mainly here to make sure scratch is not infected.  Scratch does not appear infected on examination.    -she will buy over the counter barrier cream and apply daily until healed

## 2020-03-30 NOTE — Progress Notes (Signed)
CC:  left shoulder laceration subsequent encounter   HPI:  [name] is a 69 y.o. female with PMH below.  Today we will address left shoulder laceration subsequent encounter   Please see A&P for status of the patient's chronic medical conditions  Past Medical History:  Diagnosis Date  . Carpal tunnel syndrome   . Diabetes mellitus    Type II- diagnosed 30 yrs. ago  . Family history of adverse reaction to anesthesia    20 YRS. AGO, HER 20 YR. OLD NEPHEW, ENDED UP ON VENTILATOR- PT. THINKS ITS BECAUSE HE ASPIRATED   . Hypercalcemia   . Hyperlipidemia   . Hypertension   . Osteopenia   . Rotator cuff syndrome of right shoulder    Review of Systems:  ROS: Pulmonary: pt denies increased work of breathing, shortness of breath,  Cardiac: pt denies palpitations, chest pain,  Abdominal: pt denies abdominal pain, nausea, vomiting, or diarrhea   Physical Exam:  Vitals:   03/30/20 1502  BP: 139/74  Pulse: 89  SpO2: 99%  Weight: 179 lb 11.2 oz (81.5 kg)   Cardiac: JVD flat, normal rate and rhythm, clear s1 and s2, no murmurs, rubs or gallops, no LE edema Pulmonary: CTAB, not in distress Skin: long 12cm scratch wrapping around left deltoid around 90mm in diameter, healed with scab formation Media Information   Document Information  Photos    03/30/2020 15:11  Attached To:  Office Visit on 03/30/20 with Katherine Roan, MD  Source Information  Traquan Duarte, Jenne Pane, MD  [facility]    Psych: Alert, conversant, in good spirits   Social History   Socioeconomic History  . Marital status: Married    Spouse name: Not on file  . Number of children: Not on file  . Years of education: 35  . Highest education level: Not on file  Occupational History  . Occupation: Research scientist (life sciences): [employer]  Tobacco Use  . Smoking status: Never Smoker  . Smokeless tobacco: Never Used  Substance and Sexual Activity  . Alcohol use: No  . Drug use: No  .  Sexual activity: Not on file  Other Topics Concern  . Not on file  Social History Narrative  . Not on file   Social Determinants of Health   Financial Resource Strain:   . Difficulty of Paying Living Expenses:   Food Insecurity:   . Worried About Charity fundraiser in the Last Year:   . Arboriculturist in the Last Year:   Transportation Needs:   . Film/video editor (Medical):   Marland Kitchen Lack of Transportation (Non-Medical):   Physical Activity:   . Days of Exercise per Week:   . Minutes of Exercise per Session:   Stress:   . Feeling of Stress :   Social Connections:   . Frequency of Communication with Friends and Family:   . Frequency of Social Gatherings with Friends and Family:   . Attends Religious Services:   . Active Member of Clubs or Organizations:   . Attends Archivist Meetings:   Marland Kitchen Marital Status:   Intimate Partner Violence:   . Fear of Current or Ex-Partner:   . Emotionally Abused:   Marland Kitchen Physically Abused:   . Sexually Abused:     Family History  Problem Relation Age of Onset  . Hypertension Mother   . Cancer Mother   . Breast cancer Mother 37  . Breast cancer  Maternal Aunt 50  . Breast cancer Cousin 48    Assessment & Plan:   See Encounters Tab for problem based charting.  Patient discussed with Dr. Lynnae January

## 2020-03-30 NOTE — Patient Instructions (Signed)
Michele Andrews, please apply the barrier cream zinc oxide as we discussed.  This will heal on it's own and does not look infected.  Please schedule a follow up appointment with your pcp.

## 2020-03-31 NOTE — Progress Notes (Signed)
Internal Medicine Clinic Attending  Case discussed with Dr. Winfrey  at the time of the visit.  We reviewed the resident's history and exam and pertinent patient test results.  I agree with the assessment, diagnosis, and plan of care documented in the resident's note.  

## 2020-04-14 ENCOUNTER — Other Ambulatory Visit: Payer: Self-pay | Admitting: Internal Medicine

## 2020-04-14 DIAGNOSIS — E119 Type 2 diabetes mellitus without complications: Secondary | ICD-10-CM

## 2020-04-15 ENCOUNTER — Other Ambulatory Visit: Payer: Self-pay | Admitting: Internal Medicine

## 2020-04-15 MED FILL — BASAGLAR 100 UNIT/ML KWIKPE: 100 | 81 days supply | Qty: 18 | Fill #0

## 2020-04-26 ENCOUNTER — Other Ambulatory Visit: Payer: Self-pay | Admitting: Internal Medicine

## 2020-04-26 DIAGNOSIS — I10 Essential (primary) hypertension: Secondary | ICD-10-CM

## 2020-04-26 MED FILL — METOPROLOL SUCCINATE ER 25: 25 | 90 days supply | Qty: 90 | Fill #0

## 2020-04-26 MED FILL — JANUVIA 100 MG TABLET: 100 | 30 days supply | Qty: 30 | Fill #2

## 2020-04-27 ENCOUNTER — Other Ambulatory Visit: Payer: Self-pay | Admitting: Internal Medicine

## 2020-04-27 MED FILL — LISINOPRIL 10 MG TABS: 10 | 90 days supply | Qty: 90 | Fill #0

## 2020-04-28 ENCOUNTER — Ambulatory Visit (INDEPENDENT_AMBULATORY_CARE_PROVIDER_SITE_OTHER): Payer: No Typology Code available for payment source | Admitting: Internal Medicine

## 2020-04-28 ENCOUNTER — Encounter: Payer: Self-pay | Admitting: Internal Medicine

## 2020-04-28 ENCOUNTER — Other Ambulatory Visit: Payer: Self-pay

## 2020-04-28 ENCOUNTER — Other Ambulatory Visit: Payer: Self-pay | Admitting: Internal Medicine

## 2020-04-28 VITALS — BP 133/78 | HR 87 | Temp 98.2°F | Ht 64.0 in | Wt 178.6 lb

## 2020-04-28 DIAGNOSIS — Z0000 Encounter for general adult medical examination without abnormal findings: Secondary | ICD-10-CM

## 2020-04-28 DIAGNOSIS — Z13820 Encounter for screening for osteoporosis: Secondary | ICD-10-CM

## 2020-04-28 DIAGNOSIS — M1712 Unilateral primary osteoarthritis, left knee: Secondary | ICD-10-CM

## 2020-04-28 DIAGNOSIS — E119 Type 2 diabetes mellitus without complications: Secondary | ICD-10-CM

## 2020-04-28 DIAGNOSIS — I10 Essential (primary) hypertension: Secondary | ICD-10-CM | POA: Diagnosis not present

## 2020-04-28 DIAGNOSIS — Z794 Long term (current) use of insulin: Secondary | ICD-10-CM | POA: Diagnosis not present

## 2020-04-28 DIAGNOSIS — M25512 Pain in left shoulder: Secondary | ICD-10-CM

## 2020-04-28 LAB — BASIC METABOLIC PANEL
Anion gap: 11 (ref 5–15)
BUN: 11 mg/dL (ref 8–23)
CO2: 26 mmol/L (ref 22–32)
Calcium: 10.4 mg/dL — ABNORMAL HIGH (ref 8.9–10.3)
Chloride: 99 mmol/L (ref 98–111)
Creatinine, Ser: 0.68 mg/dL (ref 0.44–1.00)
GFR calc Af Amer: >60 mL/min (ref >60)
GFR calc non Af Amer: >60 mL/min (ref >60)
Glucose, Bld: 125 mg/dL — ABNORMAL HIGH (ref 70–99)
Potassium: 4.3 mmol/L (ref 3.5–5.1)
Sodium: 136 mmol/L (ref 135–145)

## 2020-04-28 LAB — POCT GLYCOSYLATED HEMOGLOBIN (HGB A1C): Hemoglobin A1C: 8.6 % — AB (ref 4.0–5.6)

## 2020-04-28 LAB — GLUCOSE, CAPILLARY: Glucose-Capillary: 123 mg/dL — ABNORMAL HIGH (ref 70–99)

## 2020-04-28 MED ORDER — METFORMIN HCL 1000 MG PO TABS: ORAL_TABLET | ORAL | 1 refills | Status: DC

## 2020-04-28 MED ORDER — EMPAGLIFLOZIN 10 MG PO TABS: 10.0000 mg | ORAL_TABLET | Freq: Every day | ORAL | 5 refills | Status: DC

## 2020-04-28 MED ORDER — IBUPROFEN 800 MG PO TABS: 800.0000 mg | ORAL_TABLET | Freq: Three times a day (TID) | ORAL | 1 refills | Status: DC | PRN

## 2020-04-28 MED ORDER — SITAGLIPTIN PHOSPHATE 100 MG PO TABS: ORAL_TABLET | ORAL | 1 refills | Status: DC

## 2020-04-28 MED ORDER — DICLOFENAC SODIUM 1 % EX GEL: CUTANEOUS | 0 refills | Status: DC

## 2020-04-28 MED FILL — JARDIANCE 10 MG TABLET: 10 | 30 days supply | Qty: 30 | Fill #0

## 2020-04-28 MED FILL — IBUPROFEN 800 MG TABS: 800 | 10 days supply | Qty: 30 | Fill #0

## 2020-04-28 MED FILL — DICLOFENAC SODIUM 1 % GEL: 1 | 13 days supply | Qty: 100 | Fill #0

## 2020-04-28 NOTE — Patient Instructions (Signed)
Thank you for allowing [place] to provide your care today. Today we discussed your type II diabetes.     I have ordered the following labs for you:  Basic metabolic panel   I will call if any are abnormal.    Today we made the following changes to your medications:   Please START taking  jardiance 10 mg - take one tablet per day  Please STOP taking   glipizide  Please follow-up in three months.    Please call the internal medicine center clinic if you have any questions or concerns, we may be able to help and keep you from a long and expensive emergency room wait. Our clinic and after hours phone number is [phone], the best time to call is Monday through Friday 9 am to 4 pm but there is always someone available 24/7 if you have an emergency. If you need medication refills please notify your pharmacy one week in advance and they will send [place] a request.

## 2020-04-28 NOTE — Progress Notes (Signed)
   CC: type II diabetes  HPI:  [name] is a 69 y.o. with PMH as below.   Please see A&P for assessment of the patient's acute and chronic medical conditions.   Past Medical History:  Diagnosis Date  . Carpal tunnel syndrome   . Diabetes mellitus    Type II- diagnosed 30 yrs. ago  . Family history of adverse reaction to anesthesia    20 YRS. AGO, HER 19 YR. OLD NEPHEW, ENDED UP ON VENTILATOR- PT. THINKS ITS BECAUSE HE ASPIRATED   . Hypercalcemia   . Hyperlipidemia   . Hypertension   . Osteopenia   . Rotator cuff syndrome of right shoulder    Review of Systems:   Review of Systems  Constitutional: Negative for fever, malaise/fatigue and weight loss.  Respiratory: Negative for cough and shortness of breath.   Cardiovascular: Negative for chest pain and leg swelling.  Genitourinary: Negative for dysuria, frequency and urgency.  Musculoskeletal: Positive for joint pain. Negative for myalgias.  Neurological: Negative for dizziness.   Physical Exam: Constitution: NAD, appears stated age HENT: [place]/AT Cardio: RRR, no m/r/g, no LE edema  Respiratory: CTA, no w/r/r Abdominal: NTTP, soft, non-distended MSK: moving all extremities, sensation intact, small bony prominence on capitate right hand, NTTP Neuro: normal affect, a&ox3 Skin: c/d/i, no lesion or bruising of right hand   Vitals:   04/28/20 1448  BP: 133/78  Pulse: 87  Temp: 98.2 F (36.8 C)  TempSrc: Oral  SpO2: 100%  Weight: 178 lb 9.6 oz (81 kg)  Height: 5\' 4"  (1.626 m)    Assessment & Plan:   See Encounters Tab for problem based charting.  Patient discussed with Dr. Lynnae January

## 2020-04-29 ENCOUNTER — Other Ambulatory Visit (INDEPENDENT_AMBULATORY_CARE_PROVIDER_SITE_OTHER): Payer: No Typology Code available for payment source

## 2020-04-29 DIAGNOSIS — I10 Essential (primary) hypertension: Secondary | ICD-10-CM | POA: Diagnosis not present

## 2020-04-29 LAB — PARATHYROID HORMONE, INTACT (NO CA): PTH: 19 pg/mL (ref 15–65)

## 2020-04-29 NOTE — Assessment & Plan Note (Signed)
-   received pfizer vaccination

## 2020-04-29 NOTE — Assessment & Plan Note (Signed)
BP Readings from Last 3 Encounters:  04/28/20 133/78  03/30/20 139/74  12/26/19 130/81  Current medications include lasix 40 mg qd, lisinopril 10 mg, and toprol 25 mg qd. No acute concerns or noticed adverse effects.   - bmp  - continue current mediations.

## 2020-04-29 NOTE — Assessment & Plan Note (Signed)
Previously 10.4 10/2019 with history of surgery for hyperparathyroidism. PTH and TSH at that time was within normal limits, albumin normal as well. Last Fit test 10/2019 within normal limits (she has opted out of receiving colonoscopy), mammogram 3/21 showed calcifications which were found to be benign on repeat diagnostic mammogram.   - repeat Ca, PTH - if still elevated will do PTHrp, 1,25- vitamin D, and 25-hydroxyvitamin D  ADDENDUM: PTH 16, Ca 10.4. Above reference labs ordered and she will come to clinic to have them done today.

## 2020-04-29 NOTE — Assessment & Plan Note (Signed)
A1c today 8.7. Last A1c 7.7 10/2019. Current medications are lantus 22U, glipizide 10 mg qam and 5 mg qhs, januvia 100 mg qd. Previously on SGLT-2 from glipizide but preferred to switch back to glipizide. Given pill box during last appointment as she was having trouble keeping up with medications. She has been taking her medications but hasn't been eating as well as usual. She states she has been eating more sweets and recently started drinking a lot of coconut water. She also had recent episode of hypoglycemia. Discussed the glip puts her at increased risk for hypoglycemia and recommended trying SGLT-2 inhibitor. She cannot remember why she wanted to switch back before but is open to trying it.   - sent ophthalmology referral  - start jardiance 10 mg  - stop glipizide - discussed healthier diet, will stop coconut water - f/u three months for a1c - continue atorva 40 mg

## 2020-05-03 NOTE — Progress Notes (Signed)
Internal Medicine Clinic Attending  Case discussed with Dr. Seawell at the time of the visit.  We reviewed the resident's history and exam and pertinent patient test results.  I agree with the assessment, diagnosis, and plan of care documented in the resident's note.    

## 2020-05-04 MED FILL — metFORMIN HCL 1000 MG TABS: 1000 | 90 days supply | Qty: 180 | Fill #0

## 2020-05-05 ENCOUNTER — Other Ambulatory Visit: Payer: Self-pay | Admitting: Internal Medicine

## 2020-05-05 DIAGNOSIS — Z13820 Encounter for screening for osteoporosis: Secondary | ICD-10-CM

## 2020-05-06 LAB — VITAMIN D 25 HYDROXY (VIT D DEFICIENCY, FRACTURES): Vit D, 25-Hydroxy: 36 ng/mL (ref 30.0–100.0)

## 2020-05-06 LAB — PTH-RELATED PEPTIDE: PTH-related peptide: <2 pmol/L

## 2020-05-06 LAB — VITAMIN D 1,25 DIHYDROXY
Vitamin D 1, 25 (OH)2 Total: 49 pg/mL
Vitamin D2 1, 25 (OH)2: <10 pg/mL
Vitamin D3 1, 25 (OH)2: 47 pg/mL

## 2020-05-24 ENCOUNTER — Other Ambulatory Visit: Payer: Self-pay | Admitting: Internal Medicine

## 2020-05-27 ENCOUNTER — Other Ambulatory Visit: Payer: Self-pay | Admitting: *Deleted

## 2020-05-27 ENCOUNTER — Other Ambulatory Visit (INDEPENDENT_AMBULATORY_CARE_PROVIDER_SITE_OTHER): Payer: No Typology Code available for payment source

## 2020-05-27 DIAGNOSIS — I10 Essential (primary) hypertension: Secondary | ICD-10-CM

## 2020-05-27 MED FILL — JANUVIA 100 MG TABLET: 100 | 30 days supply | Qty: 30 | Fill #3

## 2020-05-28 MED ORDER — FUROSEMIDE 40 MG PO TABS: 40.0000 mg | ORAL_TABLET | Freq: Every day | ORAL | 1 refills | Status: DC

## 2020-05-28 MED FILL — FUROSEMIDE 40 MG TAB: 40 | 90 days supply | Qty: 90 | Fill #0

## 2020-05-30 LAB — PE AND FLC, SERUM
A/G Ratio: 0.8 (ref 0.7–1.7)
Albumin ELP: 3.9 g/dL (ref 2.9–4.4)
Alpha 1: 0.2 g/dL (ref 0.0–0.4)
Alpha 2: 1 g/dL (ref 0.4–1.0)
Beta: 1.3 g/dL (ref 0.7–1.3)
Gamma Globulin: 2.3 g/dL — ABNORMAL HIGH (ref 0.4–1.8)
Globulin, Total: 4.7 g/dL — ABNORMAL HIGH (ref 2.2–3.9)
Ig Kappa Free Light Chain: 35.5 mg/L — ABNORMAL HIGH (ref 3.3–19.4)
Ig Lambda Free Light Chain: 22.9 mg/L (ref 5.7–26.3)
KAPPA/LAMBDA RATIO: 1.55 (ref 0.26–1.65)
Total Protein: 8.6 g/dL — ABNORMAL HIGH (ref 6.0–8.5)

## 2020-06-01 ENCOUNTER — Telehealth: Payer: Self-pay | Admitting: *Deleted

## 2020-06-01 NOTE — Telephone Encounter (Signed)
Pt states since starting jardiance her blood sugars have been 140 to 220. She states she remembers when she tried jardiance before it doing this. Please advise.

## 2020-06-02 ENCOUNTER — Other Ambulatory Visit: Payer: Self-pay | Admitting: Internal Medicine

## 2020-06-02 DIAGNOSIS — E119 Type 2 diabetes mellitus without complications: Secondary | ICD-10-CM

## 2020-06-02 DIAGNOSIS — Z794 Long term (current) use of insulin: Secondary | ICD-10-CM

## 2020-06-02 MED ORDER — EMPAGLIFLOZIN 25 MG PO TABS: 25.0000 mg | ORAL_TABLET | Freq: Every day | ORAL | 3 refills | Status: DC

## 2020-06-02 MED FILL — JARDIANCE 25 MG TABLET: 25 | 30 days supply | Qty: 30 | Fill #0

## 2020-06-02 NOTE — Telephone Encounter (Signed)
Patient notified to increase Jardiance to 25 mg daily before breakfast. States she will p/u new Rx tomorrow. Hubbard Hartshorn, BSN, RN-BC

## 2020-06-02 NOTE — Telephone Encounter (Signed)
She can increase her jardiance to 25 mg per day. I will send in a new prescription.

## 2020-06-08 LAB — HM DIABETES EYE EXAM

## 2020-06-09 ENCOUNTER — Encounter: Payer: Self-pay | Admitting: *Deleted

## 2020-06-11 ENCOUNTER — Encounter: Payer: Self-pay | Admitting: *Deleted

## 2020-06-14 ENCOUNTER — Other Ambulatory Visit: Payer: Self-pay | Admitting: Internal Medicine

## 2020-06-14 DIAGNOSIS — D892 Hypergammaglobulinemia, unspecified: Secondary | ICD-10-CM

## 2020-06-15 ENCOUNTER — Encounter (INDEPENDENT_AMBULATORY_CARE_PROVIDER_SITE_OTHER): Payer: Self-pay | Admitting: Ophthalmology

## 2020-06-15 ENCOUNTER — Telehealth: Payer: Self-pay | Admitting: Internal Medicine

## 2020-06-15 ENCOUNTER — Ambulatory Visit (INDEPENDENT_AMBULATORY_CARE_PROVIDER_SITE_OTHER): Payer: No Typology Code available for payment source | Admitting: Ophthalmology

## 2020-06-15 ENCOUNTER — Other Ambulatory Visit: Payer: Self-pay | Admitting: Internal Medicine

## 2020-06-15 ENCOUNTER — Other Ambulatory Visit: Payer: Self-pay

## 2020-06-15 DIAGNOSIS — H2513 Age-related nuclear cataract, bilateral: Secondary | ICD-10-CM

## 2020-06-15 DIAGNOSIS — E113393 Type 2 diabetes mellitus with moderate nonproliferative diabetic retinopathy without macular edema, bilateral: Secondary | ICD-10-CM | POA: Diagnosis not present

## 2020-06-15 DIAGNOSIS — E11319 Type 2 diabetes mellitus with unspecified diabetic retinopathy without macular edema: Secondary | ICD-10-CM | POA: Insufficient documentation

## 2020-06-15 DIAGNOSIS — H0014 Chalazion left upper eyelid: Secondary | ICD-10-CM | POA: Diagnosis not present

## 2020-06-15 LAB — HM DIABETES EYE EXAM

## 2020-06-15 NOTE — Assessment & Plan Note (Signed)
The nature of moderate nonproliferative diabetic retinopathy was discussed with the patient as well as the need for more frequent follow up to judge for progression. Good blood glucose, blood pressure, and serum lipid control was recommended as well as avoidance of smoking and maintenance of normal weight.  Close follow up with PCP encouraged.   The nature of age realated macular degeneration (ARMD)is explained as follows: The dry form refers to the progressive loss of normal blood supply to the central vision as a result of a combination of factors which include aging blood supply (arteriosclerosis, hardening of the arteries), genetics, smoking habits, and history of hypertension. Currently, no eye medications or vitamins slow this type of aging effect upon vision, however cessation of smoking and controlling hypertension help slow the disorder. The following analogy helps explain this: I describe the dry form of ARMD like a house of the same age as your eyes, which shows typical wear and tear of age upon the house structure and appearance. Like the aging house which can fall down structurally, the dry form of ARMD can deteriorate the structure of the macula (center) of the retina, most often gradually and affect central vision tasks such as reading and driving. The wet form of ARMD refers to the development of abnormally growing blood vessels usually near or under the central vision, with potential risk of permanent visual changes or vision losses. This complication of the Dry form of ARMD may be moderately reduced by use of AREDS 2 formula multivitamins daily. I describe the Wet form of ARMD as like the development of a fire in an aging house (DRY ARMD). It may develop suddenly, progress rapidly and be destructive based on where it starts and how big the fire is when found. In the eye, the house fire analogy refers to the abnormal blood vessels growing destructively near the central vison in the retina, or  film of the eye. Halting the growth of blood vessels with laser (hot or cold) or injectable medications is best way "to put the fire out". Many patients will experience a stabilization or even improvement in vision with a treatment course, while others may still face a loss of vision. The use of injectable medications has revolutionized therapy and is currently the only proven therapy to provide the chance of stable or improved acuity from new and recent destructive wet ARMD.

## 2020-06-15 NOTE — Telephone Encounter (Signed)
Attempted to call the patient with her results and to notify her the Dr. Sharon Seller will be referring her to Heme/Onc for further evaluation and management of MGUS. No answer. Left HIPPA compliant message.  Marianna Payment, D.O. [facility], PGY-2 Pager: [phone], Phone: [phone] Date 06/15/2020 Time 3:55 PM

## 2020-06-15 NOTE — Assessment & Plan Note (Signed)
Warm compresses to upper lid, follow up with Dr. Katy Fitch PRN

## 2020-06-15 NOTE — Assessment & Plan Note (Signed)
>>  ASSESSMENT AND PLAN FOR MODERATE NONPROLIFERATIVE DIABETIC RETINOPATHY OF BOTH EYES WITHOUT MACULAR EDEMA ASSOCIATED WITH TYPE 2 DIABETES MELLITUS (HCC) WRITTEN ON 06/15/2020  4:02 PM BY ELNER KUBA A, MD  The nature of moderate nonproliferative diabetic retinopathy was discussed with the patient as well as the need for more frequent follow up to judge for progression. Good blood glucose, blood pressure, and serum lipid control was recommended as well as avoidance of smoking and maintenance of normal weight.  Close follow up with PCP encouraged.   The nature of age realated macular degeneration (ARMD)is explained as follows: The dry form refers to the progressive loss of normal blood supply to the central vision as a result of a combination of factors which include aging blood supply (arteriosclerosis, hardening of the arteries), genetics, smoking habits, and history of hypertension. Currently, no eye medications or vitamins slow this type of aging effect upon vision, however cessation of smoking and controlling hypertension help slow the disorder. The following analogy helps explain this: I describe the dry form of ARMD like a house of the same age as your eyes, which shows typical wear and tear of age upon the house structure and appearance. Like the aging house which can fall down structurally, the dry form of ARMD can deteriorate the structure of the macula (center) of the retina, most often gradually and affect central vision tasks such as reading and driving. The wet form of ARMD refers to the development of abnormally growing blood vessels usually near or under the central vision, with potential risk of permanent visual changes or vision losses. This complication of the Dry form of ARMD may be moderately reduced by use of AREDS 2 formula multivitamins daily. I describe the Wet form of ARMD as like the development of a fire in an aging house (DRY ARMD). It may develop suddenly, progress rapidly and be  destructive based on where it starts and how big the fire is when found. In the eye, the house fire analogy refers to the abnormal blood vessels growing destructively near the central vison in the retina, or film of the eye. Halting the growth of blood vessels with laser (hot or cold) or injectable medications is best way "to put the fire out". Many patients will experience a stabilization or even improvement in vision with a treatment course, while others may still face a loss of vision. The use of injectable medications has revolutionized therapy and is currently the only proven therapy to provide the chance of stable or improved acuity from new and recent destructive wet ARMD.

## 2020-06-15 NOTE — Progress Notes (Signed)
06/15/2020     CHIEF COMPLAINT Patient presents for Diabetic Eye Exam   HISTORY OF PRESENT ILLNESS: Michele Andrews is a 69 y.o. female who presents to the clinic today for:   HPI    Diabetic Eye Exam    Vision is stable.  Associated Symptoms Floaters.  Diabetes characteristics include Type 2 and on insulin.  This started 20 years ago.  Blood sugar level fluctuates.  Last Blood Glucose 180 (This AM).  Last A1C 8.6 (04/2020).          Comments    NPDR OU - Ref'd by Dr. Clent Jacks  Pt reports swelling of the lid OS since last week.  Pt denies noticeable recent changes to [place]. Pt reports occasional floaters OU. Pt sts she has been using hot compresses OS, which has helped the swelling.       Last edited by Rockie Neighbours, [place] on 06/15/2020  3:03 PM. (History)      Referring physician: Clent Jacks, MD [address] [place],  [place] [address]  HISTORICAL INFORMATION:   Selected notes from the MEDICAL RECORD NUMBER    Lab Results  Component Value Date   HGBA1C 8.6 (A) 04/28/2020     CURRENT MEDICATIONS: Current Outpatient Medications (Ophthalmic Drugs)  Medication Sig  . hydroxypropyl methylcellulose / hypromellose (ISOPTO TEARS / GONIOVISC) 2.5 % ophthalmic solution Place 1-2 drops into both eyes 3 (three) times daily as needed for dry eyes.   No current facility-administered medications for this visit. (Ophthalmic Drugs)   Current Outpatient Medications (Other)  Medication Sig  . Accu-Chek FastClix Lancets MISC USE TO CHECK BLOOD SUGAR 2 TIMES A DAY  . ACCU-CHEK GUIDE test strip USE 3 TO 4 TIMES DAILY TO CHECK BLOOD SUGAR  . acetaminophen (TYLENOL) 650 MG CR tablet Take 650 mg by mouth every 8 (eight) hours as needed for pain.  Marland Kitchen atorvastatin (LIPITOR) 40 MG tablet Take 1 tablet (40 mg total) by mouth daily.  . Blood Glucose Monitoring Suppl (FREESTYLE FREEDOM LITE) w/Device KIT 1 each by Does not apply route 3 (three) times daily before meals.  . Blood  Glucose Monitoring Suppl (TRUETRACK BLOOD GLUCOSE) W/DEVICE KIT Use to check blood sugar 2 times a day   . Cholecalciferol (VITAMIN D3) 2000 units TABS Take 2,000 Units by mouth daily.  . diclofenac (VOLTAREN) 75 MG EC tablet Take 1 tablet (75 mg total) by mouth 2 (two) times daily.  . diclofenac Sodium (VOLTAREN) 1 % GEL APPLY 2 GRAMS TOPICALLY 4 TIMES DAILY.  Marland Kitchen empagliflozin (JARDIANCE) 25 MG TABS tablet Take 1 tablet (25 mg total) by mouth daily before breakfast.  . fluticasone (FLONASE) 50 MCG/ACT nasal spray Place into the nose.  . furosemide (LASIX) 40 MG tablet Take 1 tablet (40 mg total) by mouth daily.  Marland Kitchen glucose blood (FREESTYLE LITE) test strip Use as instructed  . ibuprofen (ADVIL) 800 MG tablet Take 1 tablet (800 mg total) by mouth every 8 (eight) hours as needed. for pain  . Insulin Glargine (BASAGLAR KWIKPEN) 100 UNIT/ML INJECT 0.22 MLS (22 UNITS TOTAL) INTO THE SKIN AT BEDTIME.  . Insulin Pen Needle 31G X 5 MM MISC 1 each by Does not apply route 3 (three) times daily before meals.  . Lancets (FREESTYLE) lancets Use 3 times daily to check blood sugar  . lisinopril (ZESTRIL) 10 MG tablet TAKE 1 TABLET (10 MG TOTAL) BY MOUTH DAILY.  . metFORMIN (GLUCOPHAGE) 1000 MG tablet TAKE 1 TABLET (  1,000 MG TOTAL) BY MOUTH 2 TIMES DAILY WITH A MEAL.  . methocarbamol (ROBAXIN) 500 MG tablet methocarbamol 500 mg tablet  . metoprolol succinate (TOPROL-XL) 25 MG 24 hr tablet TAKE 1 TABLET BY MOUTH DAILY BEFORE BREAKFAST  . sitaGLIPtin (JANUVIA) 100 MG tablet TAKE 1 TABLET (100 MG TOTAL) BY MOUTH DAILY.   No current facility-administered medications for this visit. (Other)      REVIEW OF SYSTEMS:    ALLERGIES No Known Allergies  PAST MEDICAL HISTORY Past Medical History:  Diagnosis Date  . Carpal tunnel syndrome   . Diabetes mellitus    Type II- diagnosed 30 yrs. ago  . Family history of adverse reaction to anesthesia    20 YRS. AGO, HER 20 YR. OLD NEPHEW, ENDED UP ON VENTILATOR- PT.  THINKS ITS BECAUSE HE ASPIRATED   . Hypercalcemia   . Hyperlipidemia   . Hypertension   . Osteopenia   . Rotator cuff syndrome of right shoulder    Past Surgical History:  Procedure Laterality Date  . ABDOMINAL HYSTERECTOMY    . BREAST CYST EXCISION Left   . BREAST SURGERY Left 1963   cyst removed   . PARATHYROIDECTOMY Right 11/08/2015  . PARATHYROIDECTOMY N/A 11/08/2015   Procedure: RIGHT INFERIOR PARATHYROIDECTOMY;  Surgeon: Armandina Gemma, MD;  [facility];  Service: General;  Laterality: N/A;    FAMILY HISTORY Family History  Problem Relation Age of Onset  . Hypertension Mother   . Cancer Mother   . Breast cancer Mother 85  . Breast cancer Maternal Aunt 25  . Breast cancer Cousin 68    SOCIAL HISTORY Social History   Tobacco Use  . Smoking status: Never Smoker  . Smokeless tobacco: Never Used  Substance Use Topics  . Alcohol use: No  . Drug use: No         OPHTHALMIC EXAM:  Base Eye Exam    Visual Acuity (ETDRS)      Right Left   Dist cc 20/50 +2 20/40   Dist ph cc 20/30 -2 NI   Correction: Glasses       Tonometry (Tonopen, 3:08 PM)      Right Left   Pressure 14 13       Pupils      Pupils Dark Light Shape React APD   Right PERRL 4 3 Round Brisk None   Left PERRL 4 3 Round Brisk None       Visual Fields (Counting fingers)      Left Right    Full Full       Extraocular Movement      Right Left    Full Full       Neuro/Psych    Oriented x3: Yes   Mood/Affect: Normal       Dilation    Both eyes: 1.0% Mydriacyl, 2.5% Phenylephrine @ 3:08 PM        Slit Lamp and Fundus Exam    External Exam      Right Left   External Normal Normal       Slit Lamp Exam      Right Left   Lids/Lashes Normal Left upper lid, nodular thickening though not an overt chalazion.  Inspissated meibomian glands in the nasal aspect of the upper lid which did mobilize with some expression of meibomian gland discharge, nonpurulent.  Area is somewhat tender  with compression with a Q-tip   Conjunctiva/Sclera White and quiet White and quiet   Cornea Clear Clear  Anterior Chamber Deep and quiet Deep and quiet   Iris Round and reactive Round and reactive   Lens 2+ Nuclear sclerosis, Cortical cataract 2+ Nuclear sclerosis, Cortical cataract   Anterior Vitreous Normal Normal       Fundus Exam      Right Left   C/D Ratio 0.25 0.2   Vessels NPDR- Moderate NPDR- Mild          IMAGING AND PROCEDURES  Imaging and Procedures for 06/15/20  OCT, Retina - OU - Both Eyes       Right Eye Quality was good. Scan locations included subfoveal. Central Foveal Thickness: 309. Findings include normal observations.   Left Eye Quality was good. Scan locations included subfoveal. Central Foveal Thickness: 292. Findings include normal observations.   Notes No Active maculopathy OU by OCT       Color Fundus Photography Optos - OU - Both Eyes       Right Eye Progression has no prior data. Disc findings include normal observations. Macula : normal observations.   Left Eye Progression has no prior data. Disc findings include normal observations. Macula : normal observations.   Notes NPDR moderate OU, stable.                ASSESSMENT/PLAN:  Moderate nonproliferative diabetic retinopathy of both eyes without macular edema associated with type 2 diabetes mellitus (HCC) The nature of moderate nonproliferative diabetic retinopathy was discussed with the patient as well as the need for more frequent follow up to judge for progression. Good blood glucose, blood pressure, and serum lipid control was recommended as well as avoidance of smoking and maintenance of normal weight.  Close follow up with PCP encouraged.   The nature of age realated macular degeneration (ARMD)is explained as follows: The dry form refers to the progressive loss of normal blood supply to the central vision as a result of a combination of factors which include aging blood  supply (arteriosclerosis, hardening of the arteries), genetics, smoking habits, and history of hypertension. Currently, no eye medications or vitamins slow this type of aging effect upon vision, however cessation of smoking and controlling hypertension help slow the disorder. The following analogy helps explain this: I describe the dry form of ARMD like a house of the same age as your eyes, which shows typical wear and tear of age upon the house structure and appearance. Like the aging house which can fall down structurally, the dry form of ARMD can deteriorate the structure of the macula (center) of the retina, most often gradually and affect central vision tasks such as reading and driving. The wet form of ARMD refers to the development of abnormally growing blood vessels usually near or under the central vision, with potential risk of permanent visual changes or vision losses. This complication of the Dry form of ARMD may be moderately reduced by use of AREDS 2 formula multivitamins daily. I describe the Wet form of ARMD as like the development of a fire in an aging house (DRY ARMD). It may develop suddenly, progress rapidly and be destructive based on where it starts and how big the fire is when found. In the eye, the house fire analogy refers to the abnormal blood vessels growing destructively near the central vison in the retina, or film of the eye. Halting the growth of blood vessels with laser (hot or cold) or injectable medications is best way "to put the fire out". Many patients will experience a stabilization or even improvement in vision  with a treatment course, while others may still face a loss of vision. The use of injectable medications has revolutionized therapy and is currently the only proven therapy to provide the chance of stable or improved acuity from new and recent destructive wet ARMD.  Chalazion left upper eyelid Warm compresses to upper lid, follow up with Dr. Katy Fitch PRN       ICD-10-CM   1. Moderate nonproliferative diabetic retinopathy of both eyes without macular edema associated with type 2 diabetes mellitus (HCC)  S92.3300 OCT, Retina - OU - Both Eyes    Color Fundus Photography Optos - OU - Both Eyes  2. Nuclear sclerotic cataract of both eyes  H25.13   3. Chalazion left upper eyelid  H00.14     1.  2.  3.  Ophthalmic Meds Ordered this visit:  No orders of the defined types were placed in this encounter.      Return in about 6 months (around 12/16/2020) for DILATE OU, OCT.  There are no Patient Instructions on file for this visit.   Explained the diagnoses, plan, and follow up with the patient and they expressed understanding.  Patient expressed understanding of the importance of proper follow up care.   Clent Demark Brondon Wann M.D. Diseases & Surgery of the Retina and Vitreous [facility] [place] 06/15/20     Abbreviations: M myopia (nearsighted); A astigmatism; H hyperopia (farsighted); P presbyopia; Mrx spectacle prescription;  CTL contact lenses; OD right eye; OS left eye; OU both eyes  XT exotropia; ET esotropia; PEK punctate epithelial keratitis; PEE punctate epithelial erosions; DES dry eye syndrome; MGD meibomian gland dysfunction; ATs artificial tears; PFAT's preservative free artificial tears; [place] nuclear sclerotic cataract; PSC posterior subcapsular cataract; ERM epi-retinal membrane; PVD posterior vitreous detachment; RD retinal detachment; DM diabetes mellitus; DR diabetic retinopathy; NPDR non-proliferative diabetic retinopathy; PDR proliferative diabetic retinopathy; CSME clinically significant macular edema; DME diabetic macular edema; dbh dot blot hemorrhages; CWS cotton wool spot; POAG primary open angle glaucoma; C/D cup-to-disc ratio; HVF humphrey visual field; GVF goldmann visual field; OCT optical coherence tomography; IOP intraocular pressure; BRVO Branch retinal vein occlusion; CRVO central retinal vein occlusion; CRAO central  retinal artery occlusion; BRAO branch retinal artery occlusion; RT retinal tear; SB scleral buckle; PPV pars plana vitrectomy; VH Vitreous hemorrhage; PRP panretinal laser photocoagulation; IVK intravitreal kenalog; VMT vitreomacular traction; MH Macular hole;  NVD neovascularization of the disc; NVE neovascularization elsewhere; AREDS age related eye disease study; ARMD age related macular degeneration; POAG primary open angle glaucoma; EBMD epithelial/anterior basement membrane dystrophy; ACIOL anterior chamber intraocular lens; IOL intraocular lens; PCIOL posterior chamber intraocular lens; Phaco/IOL phacoemulsification with intraocular lens placement; [place] photorefractive keratectomy; LASIK laser assisted in situ keratomileusis; HTN hypertension; DM diabetes mellitus; COPD chronic obstructive pulmonary disease

## 2020-06-15 NOTE — Telephone Encounter (Signed)
-----   Message from Marty Heck, DO sent at 06/15/2020  9:46 AM EDT ----- Michele Andrews, I was calling her to follow-up on some results and let her know I wanted to refer her to heme/onc. She has MGUS and mild hypercalcemia. If she calls back you guys might get the message before me. 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

## 2020-06-16 ENCOUNTER — Telehealth: Payer: Self-pay | Admitting: Hematology and Oncology

## 2020-06-16 NOTE — Telephone Encounter (Signed)
Received a new hem referral from Dr. Daryll Drown for Serum gammaglobulin increased/hypercalcemia. Michele Andrews returned my call and has been scheduled to see Dr. Lindi Adie on 7/20 at 345pm. Pt aware to arrive 15 minutes early.

## 2020-06-21 ENCOUNTER — Encounter: Payer: Self-pay | Admitting: *Deleted

## 2020-06-28 ENCOUNTER — Encounter: Payer: No Typology Code available for payment source | Admitting: Hematology and Oncology

## 2020-06-28 NOTE — Progress Notes (Signed)
[facility] CONSULT NOTE  Patient Care Team: Marty Heck, DO as PCP - General Dyke Maes, OD as Consulting Physician (Optometry)  CHIEF COMPLAINTS/PURPOSE OF CONSULTATION:  Newly diagnosed hypercalcemia and increased serum gammaglobulin  HISTORY OF PRESENTING ILLNESS:  Michele Andrews 69 y.o. female is here because of recent diagnosis of hypercalcemia and increased serum gammaglobulin. She is referred by Dr. Daryll Drown. She presents to the clinic today for initial evaluation.  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  She does not report any recent infections or illnesses.  Her Covid vaccine was given in January.  The electrophoresis did not show any monoclonal protein.  The kappa light chain is slightly increased but the ratio is normal.  I reviewed her records extensively and collaborated the history with the patient.  MEDICAL HISTORY:  Past Medical History:  Diagnosis Date   Carpal tunnel syndrome    Diabetes mellitus    Type II- diagnosed 30 yrs. ago   Family history of adverse reaction to anesthesia    20 YRS. AGO, HER 19 YR. OLD NEPHEW, ENDED UP ON VENTILATOR- PT. THINKS ITS BECAUSE HE ASPIRATED    Hypercalcemia    Hyperlipidemia    Hypertension    Osteopenia    Rotator cuff syndrome of right shoulder     SURGICAL HISTORY: Past Surgical History:  Procedure Laterality Date   ABDOMINAL HYSTERECTOMY     BREAST CYST EXCISION Left    BREAST SURGERY Left 1963   cyst removed    PARATHYROIDECTOMY Right 11/08/2015   PARATHYROIDECTOMY N/A 11/08/2015   Procedure: RIGHT INFERIOR PARATHYROIDECTOMY;  Surgeon: Armandina Gemma, MD;  Location: [facility];  Service: General;  Laterality: N/A;    SOCIAL HISTORY: Social History   Socioeconomic History   Marital status: Married    Spouse name: Not on file   Number of children: Not on file   Years of education: 12   Highest education level: Not on file  Occupational History    Occupation: Chartered certified accountant    Employer: [employer]  Tobacco Use   Smoking status: Never Smoker   Smokeless tobacco: Never Used  Substance and Sexual Activity   Alcohol use: No   Drug use: No   Sexual activity: Not on file  Other Topics Concern   Not on file  Social History Narrative   Not on file   Social Determinants of Health   Financial Resource Strain:    Difficulty of Paying Living Expenses:   Food Insecurity:    Worried About Charity fundraiser in the Last Year:    Arboriculturist in the Last Year:   Transportation Needs:    Film/video editor (Medical):    Lack of Transportation (Non-Medical):   Physical Activity:    Days of Exercise per Week:    Minutes of Exercise per Session:   Stress:    Feeling of Stress :   Social Connections:    Frequency of Communication with Friends and Family:    Frequency of Social Gatherings with Friends and Family:    Attends Religious Services:    Active Member of Clubs or Organizations:    Attends Music therapist:    Marital Status:   Intimate Partner Violence:    Fear of Current or Ex-Partner:    Emotionally Abused:    Physically Abused:    Sexually Abused:     FAMILY HISTORY: Family History  Problem Relation Age of Onset  Hypertension Mother    Cancer Mother    Breast cancer Mother 61   Breast cancer Maternal Aunt 46   Breast cancer Cousin 48    ALLERGIES:  has No Known Allergies.  MEDICATIONS:  Current Outpatient Medications  Medication Sig Dispense Refill   Accu-Chek FastClix Lancets MISC USE TO CHECK BLOOD SUGAR 2 TIMES A DAY 102 each 5   ACCU-CHEK GUIDE test strip USE 3 TO 4 TIMES DAILY TO CHECK BLOOD SUGAR 360 each 1   acetaminophen (TYLENOL) 650 MG CR tablet Take 650 mg by mouth every 8 (eight) hours as needed for pain.     atorvastatin (LIPITOR) 40 MG tablet Take 1 tablet (40 mg total) by mouth daily. 90 tablet 3   Blood Glucose Monitoring Suppl (FREESTYLE  FREEDOM LITE) w/Device KIT 1 each by Does not apply route 3 (three) times daily before meals. 1 kit 0   Blood Glucose Monitoring Suppl (TRUETRACK BLOOD GLUCOSE) W/DEVICE KIT Use to check blood sugar 2 times a day      Cholecalciferol (VITAMIN D3) 2000 units TABS Take 2,000 Units by mouth daily.     diclofenac (VOLTAREN) 75 MG EC tablet Take 1 tablet (75 mg total) by mouth 2 (two) times daily. 50 tablet 2   diclofenac Sodium (VOLTAREN) 1 % GEL APPLY 2 GRAMS TOPICALLY 4 TIMES DAILY. 100 g 0   empagliflozin (JARDIANCE) 25 MG TABS tablet Take 1 tablet (25 mg total) by mouth daily before breakfast. 30 tablet 3   fluticasone (FLONASE) 50 MCG/ACT nasal spray Place into the nose.     furosemide (LASIX) 40 MG tablet Take 1 tablet (40 mg total) by mouth daily. 90 tablet 1   glucose blood (FREESTYLE LITE) test strip Use as instructed 100 each 12   hydroxypropyl methylcellulose / hypromellose (ISOPTO TEARS / GONIOVISC) 2.5 % ophthalmic solution Place 1-2 drops into both eyes 3 (three) times daily as needed for dry eyes.     ibuprofen (ADVIL) 800 MG tablet Take 1 tablet (800 mg total) by mouth every 8 (eight) hours as needed. for pain 30 tablet 1   Insulin Glargine (BASAGLAR KWIKPEN) 100 UNIT/ML INJECT 0.22 MLS (22 UNITS TOTAL) INTO THE SKIN AT BEDTIME. 36 mL 1   Insulin Pen Needle 31G X 5 MM MISC 1 each by Does not apply route 3 (three) times daily before meals. 100 each 5   Lancets (FREESTYLE) lancets Use 3 times daily to check blood sugar 300 each 1   lisinopril (ZESTRIL) 10 MG tablet TAKE 1 TABLET (10 MG TOTAL) BY MOUTH DAILY. 90 tablet 1   metFORMIN (GLUCOPHAGE) 1000 MG tablet TAKE 1 TABLET (1,000 MG TOTAL) BY MOUTH 2 TIMES DAILY WITH A MEAL. 180 tablet 1   methocarbamol (ROBAXIN) 500 MG tablet methocarbamol 500 mg tablet     metoprolol succinate (TOPROL-XL) 25 MG 24 hr tablet TAKE 1 TABLET BY MOUTH DAILY BEFORE BREAKFAST 90 tablet 1   sitaGLIPtin (JANUVIA) 100 MG tablet TAKE 1 TABLET  (100 MG TOTAL) BY MOUTH DAILY. 90 tablet 1   No current facility-administered medications for this visit.    REVIEW OF SYSTEMS:   Constitutional: Denies fevers, chills or abnormal night sweats Eyes: Denies blurriness of vision, double vision or watery eyes Ears, nose, mouth, throat, and face: Denies mucositis or sore throat Respiratory: Denies cough, dyspnea or wheezes Cardiovascular: Denies palpitation, chest discomfort or lower extremity swelling Gastrointestinal:  Denies nausea, heartburn or change in bowel habits Skin: Denies abnormal skin rashes Lymphatics: Denies new  lymphadenopathy or easy bruising Neurological:Denies numbness, tingling or new weaknesses Behavioral/Psych: Mood is stable, no new changes  Breast: Denies any palpable lumps or discharge All other systems were reviewed with the patient and are negative.  PHYSICAL EXAMINATION: ECOG PERFORMANCE STATUS: 1 - Symptomatic but completely ambulatory  Vitals:   06/29/20 1551  BP: 121/72  Pulse: 84  Resp: 18  Temp: 98.2 F (36.8 C)  SpO2: 100%   Filed Weights   06/29/20 1551  Weight: 176 lb 1.6 oz (79.9 kg)    GENERAL:alert, no distress and comfortable SKIN: skin color, texture, turgor are normal, no rashes or significant lesions EYES: normal, conjunctiva are pink and non-injected, sclera clear OROPHARYNX:no exudate, no erythema and lips, buccal mucosa, and tongue normal  NECK: supple, thyroid normal size, non-tender, without nodularity LYMPH:  no palpable lymphadenopathy in the cervical, axillary or inguinal LUNGS: clear to auscultation and percussion with normal breathing effort HEART: regular rate & rhythm and no murmurs and no lower extremity edema ABDOMEN:abdomen soft, non-tender and normal bowel sounds Musculoskeletal:no cyanosis of digits and no clubbing  PSYCH: alert & oriented x 3 with fluent speech NEURO: no focal motor/sensory deficits  LABORATORY DATA:  I have reviewed the data as listed Lab  Results  Component Value Date   WBC 8.4 10/15/2019   HGB 12.4 10/15/2019   HCT 37.7 10/15/2019   MCV 92 10/15/2019   PLT 224 10/15/2019   Lab Results  Component Value Date   NA 136 04/28/2020   K 4.3 04/28/2020   CL 99 04/28/2020   CO2 26 04/28/2020    RADIOGRAPHIC STUDIES: I have personally reviewed the radiological reports and agreed with the findings in the report.  ASSESSMENT AND PLAN:  Hypercalcemia Work-up for hypercalcemia led to serum protein electrophoresis Lab review 05/27/2020: Total protein 8.6, gammaglobulin 2.3, total globulin 4.7, M spike not observed, kappa 35.5, lambda 22.9, ratio 1.55 Elevated polyclonal globulins without any monoclonal component I discussed with the patient extensively about the difference between polyclonal gammopathy and monoclonal gammopathy.  Polyclonal gammopathy indicates active immune system that is producing antibiotics either in response to a vaccine or an underlying infection.  There is no concern for multiple myeloma and hence I would not think there is any need to do additional testing at this time. I would like to obtain quantitative immunoglobulins today. I will call her with the results of this test with a virtual visit.   All questions were answered. The patient knows to call the clinic with any problems, questions or concerns.   Rulon Eisenmenger, MD, MPH 06/29/2020    I, Molly Dorshimer, am acting as scribe for Nicholas Lose, MD.  I have reviewed the above documentation for accuracy and completeness, and I agree with the above.

## 2020-06-29 ENCOUNTER — Inpatient Hospital Stay: Payer: No Typology Code available for payment source

## 2020-06-29 ENCOUNTER — Other Ambulatory Visit: Payer: Self-pay

## 2020-06-29 ENCOUNTER — Inpatient Hospital Stay
Payer: No Typology Code available for payment source | Attending: Hematology and Oncology | Admitting: Hematology and Oncology

## 2020-06-29 DIAGNOSIS — D890 Polyclonal hypergammaglobulinemia: Secondary | ICD-10-CM | POA: Diagnosis not present

## 2020-06-29 DIAGNOSIS — D472 Monoclonal gammopathy: Secondary | ICD-10-CM

## 2020-06-29 MED FILL — JARDIANCE 25 MG TABLET: 25 | 30 days supply | Qty: 30 | Fill #1

## 2020-06-29 MED FILL — FREESTYLE LANCETS: 90 days supply | Qty: 300 | Fill #1

## 2020-06-29 MED FILL — BASAGLAR 100 UNIT/ML KWIKPE: 100 | 81 days supply | Qty: 18 | Fill #1

## 2020-06-29 MED FILL — JANUVIA 100 MG TABLET: 100 | 30 days supply | Qty: 30 | Fill #4

## 2020-06-29 NOTE — Assessment & Plan Note (Addendum)
Work-up for hypercalcemia led to serum protein electrophoresis Lab review 05/27/2020: Total protein 8.6, gammaglobulin 2.3, total globulin 4.7, M spike not observed, kappa 35.5, lambda 22.9, ratio 1.55 Elevated polyclonal globulins without any monoclonal component I discussed with the patient extensively about the difference between polyclonal gammopathy and monoclonal gammopathy.  Polyclonal gammopathy indicates active immune system that is producing antibiotics either in response to a vaccine or an underlying infection.  There is no concern for multiple myeloma and hence I would not think there is any need to do additional testing at this time.

## 2020-06-30 LAB — IGG, IGA, IGM
IgA: 242 mg/dL (ref 87–352)
IgG (Immunoglobin G), Serum: 2022 mg/dL — ABNORMAL HIGH (ref 586–1602)
IgM (Immunoglobulin M), Srm: 223 mg/dL — ABNORMAL HIGH (ref 26–217)

## 2020-06-30 LAB — ANTINUCLEAR ANTIBODIES, IFA: ANA Ab, IFA: NEGATIVE

## 2020-06-30 LAB — RHEUMATOID FACTOR: Rheumatoid fact SerPl-aCnc: <10 IU/mL (ref 0.0–13.9)

## 2020-07-01 ENCOUNTER — Telehealth: Payer: Self-pay | Admitting: Hematology and Oncology

## 2020-07-01 MED FILL — FREESTYLE LITE TEST STRIP: 30 days supply | Qty: 100 | Fill #2

## 2020-07-01 NOTE — Telephone Encounter (Signed)
Scheduled per 7/20 los. Called and left a msg.

## 2020-07-01 NOTE — Progress Notes (Signed)
  HEMATOLOGY-ONCOLOGY  TELEPHONE VISIT PROGRESS NOTE  I connected with Michele Andrews on 07/02/2020 at 11:45 AM EDT by MyChart video conference and verified that I am speaking with the correct person using two identifiers.  I discussed the limitations, risks, security and privacy concerns of performing an evaluation and management service by telephone and the availability of in person appointments.    Patient's Location: Home Physician Location: Clinic  CHIEF COMPLIANT: Follow-up of hypercalcemia and increased serum gammaglobulin  INTERVAL HISTORY: Michele Andrews is a 69 y.o. female with above-mentioned history of hypercalcemia and increased serum gammaglobulin. She presents over MyChart today for follow-up to review her labs.   Observations/Objective:  There were no vitals filed for this visit. There is no height or weight on file to calculate BMI.  I have reviewed the data as listed CMP Latest Ref Rng & Units 05/27/2020 04/28/2020 10/24/2019  Glucose 70 - 99 mg/dL - 125(H) -  BUN 8 - 23 mg/dL - 11 -  Creatinine 0.44 - 1.00 mg/dL - 0.68 -  Sodium 135 - 145 mmol/L - 136 -  Potassium 3.5 - 5.1 mmol/L - 4.3 -  Chloride 98 - 111 mmol/L - 99 -  CO2 22 - 32 mmol/L - 26 -  Calcium 8.9 - 10.3 mg/dL - 10.4(H) 10.4(H)  Total Protein 6.0 - 8.5 g/dL 8.6(H) - -  Total Bilirubin 0.0 - 1.2 mg/dL - - -  Alkaline Phos 39 - 117 IU/L - - -  AST 0 - 40 IU/L - - -  ALT 0 - 32 IU/L - - -    Lab Results  Component Value Date   WBC 8.4 10/15/2019   HGB 12.4 10/15/2019   HCT 37.7 10/15/2019   MCV 92 10/15/2019   PLT 224 10/15/2019   NEUTROABS 4.6 03/09/2015      Assessment Plan:  Hypercalcemia Work-up for hypercalcemia led to serum protein electrophoresis Lab review 05/27/2020: Total protein 8.6, gammaglobulin 2.3, total globulin 4.7, M spike not observed, kappa 35.5, lambda 22.9, ratio 1.55 Elevated polyclonal globulins without any monoclonal component  Lab review: Quantitative  immunoglobulins: IgG 2022, IgM 223, IgA 242 Rheumatoid factor and ANA were both negative  I discussed with the patient that polyclonal elevation of immunoglobulins is not considered to be abnormal.  It is not the cause of her hypercalcemia.  There is no concern for multiple myeloma. Therefore we do not recommend any further testing at this time. Please refer the patient over if there are any further hematological issues in the future.  Thank you for consulting [place].   I discussed the assessment and treatment plan with the patient. The patient was provided an opportunity to ask questions and all were answered. The patient agreed with the plan and demonstrated an understanding of the instructions. The patient was advised to call back or seek an in-person evaluation if the symptoms worsen or if the condition fails to improve as anticipated.   I provided 12 minutes of face-to-face MyChart video visit time during this encounter.    Rulon Eisenmenger, MD 07/02/2020   I, Molly Dorshimer, am acting as scribe for Nicholas Lose, MD.  I have reviewed the above documentation for accuracy and completeness, and I agree with the above.

## 2020-07-02 ENCOUNTER — Inpatient Hospital Stay (HOSPITAL_BASED_OUTPATIENT_CLINIC_OR_DEPARTMENT_OTHER): Payer: No Typology Code available for payment source | Admitting: Hematology and Oncology

## 2020-07-02 NOTE — Assessment & Plan Note (Signed)
Work-up for hypercalcemia led to serum protein electrophoresis Lab review 05/27/2020: Total protein 8.6, gammaglobulin 2.3, total globulin 4.7, M spike not observed, kappa 35.5, lambda 22.9, ratio 1.55 Elevated polyclonal globulins without any monoclonal component  Lab review: Quantitative immunoglobulins: IgG 2022, IgM 223, IgA 242 Rheumatoid factor and ANA were both negative  I discussed with the patient that polyclonal elevation of immunoglobulins is not considered to be abnormal.  It is not the cause of her hypercalcemia.  There is no concern for multiple myeloma. Therefore we do not recommend any further testing at this time. Please refer the patient over if there are any further hematological issues in the future.  Thank you for consulting [place].

## 2020-07-15 ENCOUNTER — Other Ambulatory Visit: Payer: Self-pay

## 2020-07-15 ENCOUNTER — Ambulatory Visit
Admission: RE | Admit: 2020-07-15 | Discharge: 2020-07-15 | Disposition: A | Discharge status: Home / Self Care | Payer: No Typology Code available for payment source | Source: Ambulatory Visit | Attending: Internal Medicine | Admitting: Internal Medicine

## 2020-07-15 DIAGNOSIS — Z13820 Encounter for screening for osteoporosis: Secondary | ICD-10-CM

## 2020-07-26 MED FILL — LISINOPRIL 10 MG TABS: 10 | 90 days supply | Qty: 90 | Fill #1

## 2020-07-26 MED FILL — JARDIANCE 25 MG TABLET: 25 | 30 days supply | Qty: 30 | Fill #2

## 2020-07-26 MED FILL — METOPROLOL SUCCINATE ER 25: 25 | 90 days supply | Qty: 90 | Fill #0

## 2020-07-26 MED FILL — JANUVIA 100 MG TABLET: 100 | 30 days supply | Qty: 30 | Fill #5

## 2020-08-02 ENCOUNTER — Other Ambulatory Visit: Payer: Self-pay | Admitting: Internal Medicine

## 2020-08-02 DIAGNOSIS — M1712 Unilateral primary osteoarthritis, left knee: Secondary | ICD-10-CM

## 2020-08-02 MED FILL — IBUPROFEN 800 MG TABS: 800 | 10 days supply | Qty: 30 | Fill #1

## 2020-08-02 MED FILL — DICLOFENAC SODIUM 1 % GEL: 1 | 37 days supply | Qty: 300 | Fill #0

## 2020-08-06 MED FILL — metFORMIN HCL 1000 MG TABS: 1000 | 90 days supply | Qty: 180 | Fill #1

## 2020-08-10 ENCOUNTER — Ambulatory Visit: Payer: Self-pay | Attending: Internal Medicine

## 2020-08-10 DIAGNOSIS — Z23 Encounter for immunization: Secondary | ICD-10-CM

## 2020-08-10 NOTE — Progress Notes (Signed)
   Covid-19 Vaccination Clinic  Name:  Michele Andrews    MRN: [id number] DOB: Oct 10, 1951  08/10/2020  Michele Andrews was observed post Covid-19 immunization for 15 minutes without incident. She was provided with Vaccine Information Sheet and instruction to access the V-Safe system.   Michele Andrews was instructed to call 911 with any severe reactions post vaccine: Marland Kitchen Difficulty breathing  . Swelling of face and throat  . A fast heartbeat  . A bad rash all over body  . Dizziness and weakness

## 2020-08-11 ENCOUNTER — Telehealth: Payer: Self-pay | Admitting: *Deleted

## 2020-08-11 ENCOUNTER — Other Ambulatory Visit: Payer: Self-pay

## 2020-08-11 ENCOUNTER — Ambulatory Visit (INDEPENDENT_AMBULATORY_CARE_PROVIDER_SITE_OTHER): Payer: No Typology Code available for payment source | Admitting: Internal Medicine

## 2020-08-11 ENCOUNTER — Encounter: Payer: Self-pay | Admitting: Internal Medicine

## 2020-08-11 VITALS — BP 129/76 | HR 87 | Temp 98.1°F | Ht 64.0 in | Wt 178.3 lb

## 2020-08-11 DIAGNOSIS — M25561 Pain in right knee: Secondary | ICD-10-CM

## 2020-08-11 NOTE — Patient Instructions (Signed)
[name],  It was a pleasure meeting you this afternoon! Today we talked about your knee pain. It could be likely that your pain could be from a torn tendon. We will evaluate your knee with an MRI. Additionally, please take your 800 ibuprofen for severe pain. We will follow up with your results! Have a good day.  Sincerely,  Maudie Mercury, MD

## 2020-08-11 NOTE — Progress Notes (Signed)
   CC: knee pain  HPI:  Ms.Reilynn U Nadeau is a 69 y.o. with a PMH noted below, presents to the clinic with acute knee pain. To see the management of her acute and chronic conditions, please see the A&P notes  Under the Encounter Tab.   Past Medical History:  Diagnosis Date  . Carpal tunnel syndrome   . Diabetes mellitus    Type II- diagnosed 30 yrs. ago  . Family history of adverse reaction to anesthesia    20 YRS. AGO, HER 19 YR. OLD NEPHEW, ENDED UP ON VENTILATOR- PT. THINKS ITS BECAUSE HE ASPIRATED   . Hypercalcemia   . Hyperlipidemia   . Hypertension   . Osteopenia   . Rotator cuff syndrome of right shoulder    Review of Systems:   Review of Systems  Constitutional: Negative for chills and fever.  Cardiovascular: Negative for chest pain, palpitations, orthopnea and claudication.  Musculoskeletal: Positive for falls and joint pain. Negative for back pain and neck pain.  Neurological: Negative for dizziness, tingling, tremors and headaches.     Physical Exam:  Vitals:   08/11/20 1522  BP: 129/76  Pulse: 87  Temp: 98.1 F (36.7 C)  TempSrc: Oral  SpO2: 100%  Weight: 178 lb 4.8 oz (80.9 kg)  Height: 5\' 4"  (1.626 m)   Physical Exam Constitutional:      Appearance: She is normal weight.  Cardiovascular:     Rate and Rhythm: Normal rate and regular rhythm.     Pulses: Normal pulses.     Heart sounds: Normal heart sounds. No murmur heard.  No friction rub. No gallop.   Pulmonary:     Effort: Pulmonary effort is normal.     Breath sounds: No stridor. No wheezing, rhonchi or rales.  Musculoskeletal:     Comments: R knee with no effusion,erythema, or laceration noted on appearance, normothermic bilaterally. No tenderness elicited along the midline, patella nondisplaced. Anterior and posterior drawer signs negative. negative McMurry test bilaterally.   Skin:    General: Skin is warm.  Neurological:     Mental Status: She is alert.     Assessment & Plan:   See  Encounters Tab for problem based charting.  Patient discussed with Dr. Dareen Piano

## 2020-08-11 NOTE — Telephone Encounter (Signed)
Please advise 

## 2020-08-11 NOTE — Telephone Encounter (Signed)
Patient is requesting to have an xray ordered of her right knee.  States she fell over a week ago and knee still painful.  She is able to walk on it.

## 2020-08-11 NOTE — Assessment & Plan Note (Signed)
Ms. Konieczny presents to the clinic with one week of acute traumatic knee pain. Patient states that she was playing with her [place] (69 y/o) and tripped over her pet. She landed on her right knee, but did not hit her head. She states that her R knee had instant 10/10 pain, which was sharp in character with no radiation. She has taken Voltaren gel, lidocaine cream, and tylenol which reduces her pain to an 8/10. She comes to the clinic for further evaluation of her pain.   A:  Patient presents to the clinic with knee pain in the setting of trauma. There was no appreciable effusion on physical examination, or obvious findings on physical examination. Given lack of effusion, will proceed with imaging for visualization for tendinopathy. Pending results will consult orthopaedics.   Plan:  - MRI R knee - Continue current management, 800 mg Q6H not to exceed 3200 mg daily.

## 2020-08-11 NOTE — Telephone Encounter (Signed)
To all,  I hope you are well. Please have the patient make an appointment for her knee pain, this week if possible. We can do a physical examination then and determine if she needs an xray at that time.  Thanks,  Remo Lipps

## 2020-08-17 NOTE — Progress Notes (Signed)
Internal Medicine Clinic Attending ? ?Case discussed with Dr. Winters  At the time of the visit.  We reviewed the resident?s history and exam and pertinent patient test results.  I agree with the assessment, diagnosis, and plan of care documented in the resident?s note.  ?

## 2020-08-24 ENCOUNTER — Telehealth: Payer: Self-pay | Admitting: Internal Medicine

## 2020-08-24 NOTE — Telephone Encounter (Signed)
Pls contact Melissa [phone]  Per service ext [phone]

## 2020-08-25 MED FILL — FUROSEMIDE 40 MG TAB: 40 | 90 days supply | Qty: 90 | Fill #1

## 2020-08-25 MED FILL — JANUVIA 100 MG TABLET: 100 | 30 days supply | Qty: 30 | Fill #0

## 2020-08-25 MED FILL — JARDIANCE 25 MG TABLET: 25 | 30 days supply | Qty: 30 | Fill #3

## 2020-08-25 NOTE — Telephone Encounter (Signed)
Called the [facility] with Michele Andrews. Authorization number given   [id number]

## 2020-08-27 ENCOUNTER — Ambulatory Visit (HOSPITAL_COMMUNITY): Admission: RE | Admit: 2020-08-27 | Payer: No Typology Code available for payment source | Source: Ambulatory Visit

## 2020-09-06 ENCOUNTER — Ambulatory Visit (HOSPITAL_COMMUNITY)
Admission: RE | Admit: 2020-09-06 | Discharge: 2020-09-06 | Disposition: A | Discharge status: Home / Self Care | Payer: No Typology Code available for payment source | Source: Ambulatory Visit | Attending: Internal Medicine | Admitting: Internal Medicine

## 2020-09-06 ENCOUNTER — Other Ambulatory Visit: Payer: Self-pay

## 2020-09-06 DIAGNOSIS — M25561 Pain in right knee: Secondary | ICD-10-CM | POA: Insufficient documentation

## 2020-09-08 ENCOUNTER — Ambulatory Visit (INDEPENDENT_AMBULATORY_CARE_PROVIDER_SITE_OTHER): Payer: No Typology Code available for payment source | Admitting: Internal Medicine

## 2020-09-08 ENCOUNTER — Other Ambulatory Visit: Payer: Self-pay

## 2020-09-08 ENCOUNTER — Encounter: Payer: Self-pay | Admitting: Internal Medicine

## 2020-09-08 VITALS — BP 124/74 | HR 93 | Wt 172.5 lb

## 2020-09-08 DIAGNOSIS — Z794 Long term (current) use of insulin: Secondary | ICD-10-CM

## 2020-09-08 DIAGNOSIS — E119 Type 2 diabetes mellitus without complications: Secondary | ICD-10-CM | POA: Diagnosis not present

## 2020-09-08 DIAGNOSIS — S82109D Unspecified fracture of upper end of unspecified tibia, subsequent encounter for closed fracture with routine healing: Secondary | ICD-10-CM | POA: Diagnosis not present

## 2020-09-08 DIAGNOSIS — S82109A Unspecified fracture of upper end of unspecified tibia, initial encounter for closed fracture: Secondary | ICD-10-CM

## 2020-09-08 LAB — POCT GLYCOSYLATED HEMOGLOBIN (HGB A1C): Hemoglobin A1C: 8.2 % — AB (ref 4.0–5.6)

## 2020-09-08 LAB — GLUCOSE, CAPILLARY: Glucose-Capillary: 134 mg/dL — ABNORMAL HIGH (ref 70–99)

## 2020-09-08 NOTE — Assessment & Plan Note (Signed)
Seen by oncology for elevated IgG in the setting of hypercalcemia. Without a monoclonal component, no further work-up necessary. Hypercalcemia has been worked up with normal PTH, vitamin D, PTHrP, and TSH. With history of hyperparathyroidism s/p parathyroidectomy, will continue to monitor mild elevation in Ca.

## 2020-09-08 NOTE — Patient Instructions (Addendum)
Thank you for allowing [place] to provide your care today. Today we discussed your tibial fracture and    I have ordered the following labs for you:  Basic metabolic panel    I will call if any are abnormal.     Please follow-up in six months for A1C check.    Please call the internal medicine center clinic if you have any questions or concerns, we may be able to help and keep you from a long and expensive emergency room wait. Our clinic and after hours phone number is [phone], the best time to call is Monday through Friday 9 am to 4 pm but there is always someone available 24/7 if you have an emergency. If you need medication refills please notify your pharmacy one week in advance and they will send [place] a request.

## 2020-09-08 NOTE — Progress Notes (Signed)
   CC: Type II Diabetes  HPI:  [name] is a 69 y.o. with PMH as below.   Please see A&P for assessment of the patient's acute and chronic medical conditions.   Past Medical History:  Diagnosis Date  . Carpal tunnel syndrome   . Diabetes mellitus    Type II- diagnosed 30 yrs. ago  . Family history of adverse reaction to anesthesia    20 YRS. AGO, HER 19 YR. OLD NEPHEW, ENDED UP ON VENTILATOR- PT. THINKS ITS BECAUSE HE ASPIRATED   . Hypercalcemia   . Hyperlipidemia   . Hypertension   . Osteopenia   . Rotator cuff syndrome of right shoulder    Review of Systems:   Review of Systems  Respiratory: Negative for cough, shortness of breath and wheezing.   Cardiovascular: Negative for chest pain and leg swelling.  Genitourinary: Negative for dysuria, frequency and urgency.  Musculoskeletal: Negative for falls, joint pain and myalgias.   Physical Exam: Constitution: NAD, appears stated age Cardio: RRR, no m/r/g, no LE edema  Respiratory: CTA, no w/r/r Abdominal: NTTP, soft, non-distended MSK: full active ROM all extremities without pain, normal gait Neuro: normal affect, a&ox3 Skin: c/d/i    Vitals:   09/08/20 1352  BP: 124/74  Pulse: 93  SpO2: 100%  Weight: 172 lb 8 oz (78.2 kg)    Assessment & Plan:   See Encounters Tab for problem based charting.  Patient discussed with Dr. Daryll Drown

## 2020-09-09 LAB — BMP8+ANION GAP
Anion Gap: 15 mmol/L (ref 10.0–18.0)
BUN/Creatinine Ratio: 21 (ref 12–28)
BUN: 17 mg/dL (ref 8–27)
CO2: 27 mmol/L (ref 20–29)
Calcium: 10.2 mg/dL (ref 8.7–10.3)
Chloride: 93 mmol/L — ABNORMAL LOW (ref 96–106)
Creatinine, Ser: 0.8 mg/dL (ref 0.57–1.00)
GFR calc Af Amer: 86 mL/min/{1.73_m2} (ref >59)
GFR calc non Af Amer: 75 mL/min/{1.73_m2} (ref >59)
Glucose: 139 mg/dL — ABNORMAL HIGH (ref 65–99)
Potassium: 4.6 mmol/L (ref 3.5–5.2)
Sodium: 135 mmol/L (ref 134–144)

## 2020-09-10 DIAGNOSIS — S82109A Unspecified fracture of upper end of unspecified tibia, initial encounter for closed fracture: Secondary | ICD-10-CM | POA: Insufficient documentation

## 2020-09-10 NOTE — Assessment & Plan Note (Signed)
Her right knee pain has resolved. She only has twinges of pain occasionally. No clicking of her knee and she is able to walk normally now. She does exercises at home and has increased these slowly. On PE she has full active and passive ROM without pain, no tenderness or swelling. Normal gait. Discussed MRI findings of non-displaced fracture. With her continued healing encouraged her to continue increasing activity slowly as tolerated. F/u if symptoms develop. Also discussed with orthopedic surgery, who reviewed imaging, and agreed with this plan.

## 2020-09-10 NOTE — Assessment & Plan Note (Signed)
A1c 8.2 from 8.6 four months ago. She is taking jardiance 25 mg only. She is very close to goal of 8, discussed risks and benefits of additional medication and she would like to continue to work on her diet as she has been eating more bread recently. She is no longer supplementing with glipizide.   - f/u 3 months for a1c check  - continue jardiance 25 mg

## 2020-09-13 NOTE — Progress Notes (Signed)
Internal Medicine Clinic Attending  Case discussed with Dr. Seawell  At the time of the visit.  We reviewed the resident's history and exam and pertinent patient test results.  I agree with the assessment, diagnosis, and plan of care documented in the resident's note.  

## 2020-09-15 MED FILL — BASAGLAR 100 UNIT/ML KWIKPE: 100 | 81 days supply | Qty: 18 | Fill #2

## 2020-09-17 MED FILL — IBUPROFEN 800 MG TABS: 800 | 10 days supply | Qty: 30 | Fill #1

## 2020-09-22 ENCOUNTER — Other Ambulatory Visit: Payer: Self-pay | Admitting: Student

## 2020-09-22 ENCOUNTER — Other Ambulatory Visit: Payer: Self-pay | Admitting: Internal Medicine

## 2020-09-22 DIAGNOSIS — Z794 Long term (current) use of insulin: Secondary | ICD-10-CM

## 2020-09-22 DIAGNOSIS — E119 Type 2 diabetes mellitus without complications: Secondary | ICD-10-CM

## 2020-09-22 MED FILL — JARDIANCE 25 MG TABLET: 25 | 30 days supply | Qty: 30 | Fill #0

## 2020-09-22 MED FILL — JANUVIA 100 MG TABLET: 100 | 30 days supply | Qty: 30 | Fill #1

## 2020-10-11 ENCOUNTER — Telehealth: Payer: Self-pay

## 2020-10-11 NOTE — Telephone Encounter (Signed)
Requesting to speak with a nurse about meds, please call pt back.  

## 2020-10-11 NOTE — Telephone Encounter (Signed)
Called patient. She asks that I call back in 10 minutes. Called her back- she states that she feels better since stopping Jardiance on Friday, she does not want to restart it. Her blood sugar was 160 am, was running lower, continues to take metformin and her  22 units of  insulin at night I told her that I would send this note to her doctor for her input.

## 2020-10-11 NOTE — Telephone Encounter (Signed)
RTC, states she stopped taking her Jardiance this past weekend because "I got a yeast infection and was dizzy.  I read the instructions on the Jardiance hand-out and it said if you developed those symptoms to stop taking it and call your doctor".  She states she did not take her BS when she was dizzy. She bought OTC monistat and states her "yeast infection is better, denies any dizziness".  [name], Will you please call this patient, she may need some education. Thank you, SChaplin, RN,BSN

## 2020-10-12 NOTE — Telephone Encounter (Signed)
Patient notified to schedule and appointment wand bring hr meter.

## 2020-10-12 NOTE — Telephone Encounter (Signed)
Hello,  If she does not want to try the Jardiance again then we can schedule a follow up appointment to discuss her diabetes and adjusting her medications. She can continue the insulin and the metformin for now. Please make sure she brings in her meter to her visit.Thank you.

## 2020-10-18 ENCOUNTER — Other Ambulatory Visit (HOSPITAL_COMMUNITY): Payer: Self-pay | Admitting: Internal Medicine

## 2020-10-18 MED FILL — SHINGRIX 50 MCG SUS: 50 | 1 days supply | Qty: 1 | Fill #0

## 2020-10-22 ENCOUNTER — Other Ambulatory Visit: Payer: Self-pay | Admitting: Internal Medicine

## 2020-10-22 DIAGNOSIS — I10 Essential (primary) hypertension: Secondary | ICD-10-CM

## 2020-10-22 MED FILL — FREESTYLE LITE TEST STRIP: 30 days supply | Qty: 100 | Fill #3

## 2020-10-22 MED FILL — METOPROLOL SUCCINATE ER 25: 25 | 90 days supply | Qty: 90 | Fill #1

## 2020-10-22 MED FILL — JANUVIA 100 MG TABLET: 100 | 30 days supply | Qty: 30 | Fill #2

## 2020-10-22 MED FILL — LISINOPRIL 10 MG TABS: 10 | 90 days supply | Qty: 90 | Fill #0

## 2020-10-25 ENCOUNTER — Encounter: Payer: Self-pay | Admitting: Internal Medicine

## 2020-10-25 ENCOUNTER — Other Ambulatory Visit: Payer: Self-pay | Admitting: Internal Medicine

## 2020-10-25 ENCOUNTER — Other Ambulatory Visit: Payer: Self-pay

## 2020-10-25 ENCOUNTER — Ambulatory Visit (INDEPENDENT_AMBULATORY_CARE_PROVIDER_SITE_OTHER): Payer: No Typology Code available for payment source | Admitting: Internal Medicine

## 2020-10-25 VITALS — BP 126/75 | HR 81 | Temp 98.0°F | Ht 65.0 in | Wt 173.6 lb

## 2020-10-25 DIAGNOSIS — I10 Essential (primary) hypertension: Secondary | ICD-10-CM

## 2020-10-25 DIAGNOSIS — E119 Type 2 diabetes mellitus without complications: Secondary | ICD-10-CM | POA: Diagnosis not present

## 2020-10-25 DIAGNOSIS — Z794 Long term (current) use of insulin: Secondary | ICD-10-CM

## 2020-10-25 MED ORDER — TRESIBA FLEXTOUCH 100 UNIT/ML ~~LOC~~ SOPN: 24.0000 [IU] | PEN_INJECTOR | Freq: Every day | SUBCUTANEOUS | 1 refills | Status: DC

## 2020-10-25 MED FILL — TRESIBA FLEXTOUCH 100 UNITS: 100 | 25 days supply | Qty: 6 | Fill #0

## 2020-10-25 NOTE — Patient Instructions (Addendum)
[name],  It was a pleasure to see you today. Thank you for coming in.   Today we discussed your diabetes. In regards to this please stop taking the Jardiance and Basaglar insulin. Please start taking Tresiba 24 units daily. Please continue checking your blood sugars and bring in your meter on your next visit.   We also discussed your blood pressure. This looks good today. Continue your current medications.    We also discussed your neck spasms, continue using the exercises that you have.   Please return to clinic in 1 month or sooner if needed.   Thank you again for coming in.   [name].

## 2020-10-25 NOTE — Progress Notes (Signed)
   CC: Diabetes, HTN  HPI:  [name] is a 69 y.o. with the history listed below presenting for her diabetes.   Past Medical History:  Diagnosis Date  . Carpal tunnel syndrome   . Diabetes mellitus    Type II- diagnosed 30 yrs. ago  . Family history of adverse reaction to anesthesia    20 YRS. AGO, HER 19 YR. OLD NEPHEW, ENDED UP ON VENTILATOR- PT. THINKS ITS BECAUSE HE ASPIRATED   . Hypercalcemia   . Hyperlipidemia   . Hypertension   . Osteopenia   . Rotator cuff syndrome of right shoulder    Review of Systems:   Constitutional: Negative for chills and fever.  Respiratory: Negative for shortness of breath.   Cardiovascular: Negative for chest pain and leg swelling.  Gastrointestinal: Negative for abdominal pain, nausea and vomiting.  Neurological: Negative for dizziness and headaches.   Physical Exam:  Vitals:   10/25/20 1507 10/25/20 1536  BP: (!) 152/78 126/75  Pulse: 86 81  Temp: 98 F (36.7 C)   TempSrc: Oral   SpO2: 96%   Weight: 173 lb 9.6 oz (78.7 kg)   Height: 5\' 5"  (1.651 m)    Physical Exam HENT:     Head: Normocephalic and atraumatic.  Eyes:     Conjunctiva/sclera: Conjunctivae normal.     Pupils: Pupils are equal, round, and reactive to light.  Neck:     Thyroid: No thyromegaly.  Cardiovascular:     Rate and Rhythm: Normal rate and regular rhythm.     Heart sounds: Normal heart sounds. No murmur heard.  No friction rub. No gallop.   Pulmonary:     Effort: Pulmonary effort is normal. No respiratory distress.     Breath sounds: Normal breath sounds. No wheezing.  Abdominal:     General: Bowel sounds are normal. There is no distension.     Palpations: Abdomen is soft.  Musculoskeletal:        General: Normal range of motion.     Cervical back: Normal range of motion and neck supple.  Skin:    General: Skin is warm and dry.     Findings: No erythema.  Neurological:     Mental Status: She is alert and oriented to person, place, and  time.     Gait: Gait is intact.  Psychiatric:        Mood and Affect: Mood and affect normal.      Assessment & Plan:   See Encounters Tab for problem based charting.  Patient discussed with Dr. Philipp Ovens

## 2020-10-26 NOTE — Progress Notes (Signed)
Internal Medicine Clinic Attending ° °Case discussed with Dr. Krienke  At the time of the visit.  We reviewed the resident’s history and exam and pertinent patient test results.  I agree with the assessment, diagnosis, and plan of care documented in the resident’s note.  °

## 2020-10-26 NOTE — Assessment & Plan Note (Signed)
Patient is currently on Lisinopril 10 mg daily, Lasix 40 mg daily, and Toprol 20 mg daily. BP Readings from Last 3 Encounters:  10/25/20 126/75  09/08/20 124/74  08/11/20 129/76   BP is well controlled.   -Continue current regimen

## 2020-10-26 NOTE — Assessment & Plan Note (Addendum)
Patient is currently on Basaglar 22 units daily, Januvia 100 mg daily, and metformin 1000 mg BID.  She had been on Jardiance however developed a yeast infection, lightheadedness and dizziness and had stopped this a few weeks ago and does not want to try this again.  Her readings showed an average CBG of 156, high of 229 and low of 96.  58% of the time above target, 42% of the time within target, 0% of the time below target.  She denies any symptoms of polyuria, polydipsia, increased or decreased appetite, headaches, or lightheadedness.  She reported that her blood sugars were better controlled while she was on the Lantus.  She does report that her diet consists of bread, butter, sweets, and some processed foods, she is trying to cut down on this. Last A1c was 8.2 on 09/08/2020.  Diabetes is not well controlled at this time she would benefit from an increase in her insulin.  She may benefit from a longer acting such as [place], current insurance does not cover Lantus.  -Switch from [place] 22 units to [place] 24 units daily -Continue Januvia 100 mg daily -Continue metformin 1000 mg BID -Remove Jardiance from medication list -Continue frequent CBGs -RTC in 1 months to assess CBGs -Can consider Soliqua or Xultophy if CBGs not well controlled, would need to discontinue Januvia and [place] if this were the case  Addendum: Contacted by patient regarding elevated blood sugars after stopping the Jardiance.  Reports that her blood sugars have been around 160-180s and she was requesting to be placed back on glipizide.  On chart review patient had previously been on glipizide 10 mg in a.m. and 5 mg in p.m., this had been switched to Jardiance due to episodes of hypoglycemia.  We discussed that we can start a low-dose of glipizide however patient will need to closely monitor her CBGs and monitor for hypoglycemic symptoms.  She has a follow-up appointment scheduled with her PCP.  -Add glipizide 5 mg daily to  current medications

## 2020-10-28 ENCOUNTER — Telehealth: Payer: Self-pay | Admitting: *Deleted

## 2020-10-28 NOTE — Telephone Encounter (Signed)
Patient called in stating that she stopped Jardiance 3 weeks ago 2/2 reaction. About a week later her AM fasting CBGs have been 200-219. States she was well controlled on glucophage and thinks she should go back to that. Hubbard Hartshorn, BSN, RN-BC

## 2020-10-28 NOTE — Telephone Encounter (Signed)
Return call from pt - informed pt we are waiting on a response from the doctor.

## 2020-10-28 NOTE — Telephone Encounter (Signed)
Hello, she should still be on her glucophage which is her metformin. She should be on Lantus and metformin 1000 mg twice a day. She should have a follow up in 1 month to discuss increasing her Lantus.

## 2020-11-01 ENCOUNTER — Other Ambulatory Visit: Payer: Self-pay | Admitting: Internal Medicine

## 2020-11-01 MED ORDER — GLIPIZIDE ER 5 MG PO TB24: 5.0000 mg | ORAL_TABLET | Freq: Every day | ORAL | 5 refills | Status: DC

## 2020-11-01 MED FILL — glipiZIDE XL 5 MG TB24: 5 | 30 days supply | Qty: 30 | Fill #0

## 2020-11-01 NOTE — Telephone Encounter (Signed)
Spoke with patient this am.  Stated she would like to speak with the doctor about her Diabetes med.  Sander Nephew, RN 11/01/2020 11:03 AM

## 2020-11-01 NOTE — Telephone Encounter (Signed)
Requesting to speak with Dr. Sherry Ruffing. Please call pt back @ [phone].

## 2020-11-01 NOTE — Telephone Encounter (Signed)
Spoke with patient.

## 2020-11-08 ENCOUNTER — Other Ambulatory Visit: Payer: Self-pay | Admitting: Internal Medicine

## 2020-11-08 DIAGNOSIS — E119 Type 2 diabetes mellitus without complications: Secondary | ICD-10-CM

## 2020-11-08 DIAGNOSIS — Z794 Long term (current) use of insulin: Secondary | ICD-10-CM

## 2020-11-08 NOTE — Telephone Encounter (Signed)
Need refill on metFORMIN (GLUCOPHAGE) 1000 MG tablet ;pt contact [place], [place] - [address] [facility].

## 2020-11-09 ENCOUNTER — Other Ambulatory Visit: Payer: Self-pay | Admitting: Internal Medicine

## 2020-11-09 MED FILL — METFORMIN HCL 1000 MG TABS: 1000 | 90 days supply | Qty: 180 | Fill #0

## 2020-11-18 ENCOUNTER — Telehealth: Payer: Self-pay | Admitting: *Deleted

## 2020-11-18 DIAGNOSIS — E119 Type 2 diabetes mellitus without complications: Secondary | ICD-10-CM

## 2020-11-18 DIAGNOSIS — Z794 Long term (current) use of insulin: Secondary | ICD-10-CM

## 2020-11-18 NOTE — Telephone Encounter (Signed)
Hey,   That would be great if you could meet with her. If she would like to switch back to the basaglar or we have another affordable long acting option, whichever she would prefer works. She is good about checking her glucose and actively working on managing her diabetes.

## 2020-11-18 NOTE — Telephone Encounter (Signed)
Pt calls and states 2 vials of tresiba are 24.99 and she cannot afford this, her other that she was on was only 24.00 for 5 or 6. Could someone call her, sending to dr Georgina Peer, [place]. And dr Sharon Seller.

## 2020-11-18 NOTE — Telephone Encounter (Signed)
Hi! I called the pharmacy for clarification and they stated that the patient was using a copay card in addition to the insurance that brought the price down to $15 but there is no copay card to lower the price of the tresiba more than 24.99 for 2 pens. It is your call Dr. Sharon Seller but if the patient cannot afford it I would recommend just switching her back. If you would ever like me to see the patient for diabetes medication management feel free to let me know! I did see she is due for an A1C  end of Dec.

## 2020-11-22 ENCOUNTER — Other Ambulatory Visit: Payer: Self-pay | Admitting: Internal Medicine

## 2020-11-22 DIAGNOSIS — I10 Essential (primary) hypertension: Secondary | ICD-10-CM

## 2020-11-22 MED FILL — JANUVIA 100 MG TABLET: 100 | 30 days supply | Qty: 30 | Fill #3

## 2020-11-23 MED FILL — FUROSEMIDE 40 MG TAB: 40 | 90 days supply | Qty: 90 | Fill #0

## 2020-11-25 MED FILL — glipiZIDE XL 5 MG TB24: 5 | 30 days supply | Qty: 30 | Fill #1

## 2020-11-25 NOTE — Telephone Encounter (Signed)
Sorry! 12/21/2020

## 2020-11-25 NOTE — Telephone Encounter (Signed)
Good afternoon Dr. Sharon Seller,  This patient is scheduled to come see me after the holidays on 12/22/19. She switched backed to the basaglar for affordability issues. She also wanted to me let you know she increased her glipizide from 5mg  to 7.5mg  to help bring her blood glucose down. She does need a refill of her pen needles.  I will get an updated A1C at our visit as she is due on 12/29.   Thanks! Amorah Sebring

## 2020-11-29 ENCOUNTER — Other Ambulatory Visit: Payer: Self-pay | Admitting: Internal Medicine

## 2020-11-29 MED ORDER — INSULIN PEN NEEDLE 31G X 5 MM MISC: 1.0000 | Freq: Three times a day (TID) | 5 refills | Status: DC

## 2020-11-29 MED FILL — UNIFINE PENTIPS 31GX3/16: 31G X 5 MM | 33 days supply | Qty: 100 | Fill #0

## 2020-11-29 NOTE — Telephone Encounter (Signed)
Thanks, Dr. Georgina Peer! Refill sent.

## 2020-12-16 ENCOUNTER — Encounter (INDEPENDENT_AMBULATORY_CARE_PROVIDER_SITE_OTHER): Payer: No Typology Code available for payment source | Admitting: Ophthalmology

## 2020-12-20 MED FILL — SHINGRIX 50 MCG SUS: 50 | 1 days supply | Qty: 1 | Fill #1

## 2020-12-21 ENCOUNTER — Encounter: Payer: No Typology Code available for payment source | Admitting: Pharmacist

## 2020-12-22 ENCOUNTER — Ambulatory Visit (INDEPENDENT_AMBULATORY_CARE_PROVIDER_SITE_OTHER): Payer: No Typology Code available for payment source | Admitting: Internal Medicine

## 2020-12-22 ENCOUNTER — Encounter: Payer: Self-pay | Admitting: Internal Medicine

## 2020-12-22 ENCOUNTER — Other Ambulatory Visit: Payer: Self-pay

## 2020-12-22 VITALS — BP 137/77 | HR 98 | Temp 98.1°F | Ht 60.0 in | Wt 177.6 lb

## 2020-12-22 DIAGNOSIS — E119 Type 2 diabetes mellitus without complications: Secondary | ICD-10-CM

## 2020-12-22 DIAGNOSIS — I10 Essential (primary) hypertension: Secondary | ICD-10-CM | POA: Diagnosis not present

## 2020-12-22 DIAGNOSIS — Z794 Long term (current) use of insulin: Secondary | ICD-10-CM

## 2020-12-22 LAB — POCT GLYCOSYLATED HEMOGLOBIN (HGB A1C): Hemoglobin A1C: 8.9 % — AB (ref 4.0–5.6)

## 2020-12-22 LAB — GLUCOSE, CAPILLARY: Glucose-Capillary: 151 mg/dL — ABNORMAL HIGH (ref 70–99)

## 2020-12-22 MED ORDER — BASAGLAR KWIKPEN 100 UNIT/ML ~~LOC~~ SOPN: 22.0000 [IU] | PEN_INJECTOR | Freq: Every day | SUBCUTANEOUS | 1 refills | Status: DC

## 2020-12-22 NOTE — Patient Instructions (Signed)
Thank you, Ms.Rich Number for allowing [place] to provide your care today. Today we discussed diabetes.    I have ordered the following labs for you:   Lab Orders     Glucose, capillary     BMP8+Anion Gap     POC Hbg A1C   Tests ordered today:  None, but I will get you set up for a continuous glucose monitor.  Referrals ordered today:   Referral Orders  No referral(s) requested today     I have ordered the following medication/changed the following medications:   Stop the following medications: There are no discontinued medications.   Start the following medications: No orders of the defined types were placed in this encounter.    Follow up: 3 months    Should you have any questions or concerns please call the internal medicine clinic at [phone].     Marianna Payment, D.O. [place]

## 2020-12-22 NOTE — Progress Notes (Signed)
CC: DM  HPI:  [name] is a 70 y.o. female with a past medical history stated below and presents today for DM. Please see problem based assessment and plan for additional details.  Past Medical History:  Diagnosis Date  . Carpal tunnel syndrome   . Diabetes mellitus    Type II- diagnosed 30 yrs. ago  . Family history of adverse reaction to anesthesia    20 YRS. AGO, HER 19 YR. OLD NEPHEW, ENDED UP ON VENTILATOR- PT. THINKS ITS BECAUSE HE ASPIRATED   . Hypercalcemia   . Hyperlipidemia   . Hypertension   . Osteopenia   . Rotator cuff syndrome of right shoulder     Current Outpatient Medications on File Prior to Visit  Medication Sig Dispense Refill  . Accu-Chek FastClix Lancets MISC USE TO CHECK BLOOD SUGAR 2 TIMES A DAY 102 each 5  . ACCU-CHEK GUIDE test strip USE 3 TO 4 TIMES DAILY TO CHECK BLOOD SUGAR 360 each 1  . acetaminophen (TYLENOL) 650 MG CR tablet Take 650 mg by mouth every 8 (eight) hours as needed for pain.    Marland Kitchen atorvastatin (LIPITOR) 40 MG tablet Take 1 tablet (40 mg total) by mouth daily. 90 tablet 3  . Blood Glucose Monitoring Suppl (FREESTYLE FREEDOM LITE) w/Device KIT 1 each by Does not apply route 3 (three) times daily before meals. 1 kit 0  . Blood Glucose Monitoring Suppl (TRUETRACK BLOOD GLUCOSE) W/DEVICE KIT Use to check blood sugar 2 times a day     . Cholecalciferol (VITAMIN D3) 2000 units TABS Take 2,000 Units by mouth daily.    . diclofenac (VOLTAREN) 75 MG EC tablet Take 1 tablet (75 mg total) by mouth 2 (two) times daily. 50 tablet 2  . diclofenac Sodium (VOLTAREN) 1 % GEL APPLY 2 GRAMS TOPICALLY 4 TIMES DAILY. 300 g 0  . fluticasone (FLONASE) 50 MCG/ACT nasal spray Place into the nose.    . furosemide (LASIX) 40 MG tablet TAKE 1 TABLET (40 MG TOTAL) BY MOUTH DAILY. 90 tablet 1  . glipiZIDE (GLUCOTROL XL) 5 MG 24 hr tablet Take 1 tablet (5 mg total) by mouth daily with breakfast. 30 tablet 5  . glucose blood (FREESTYLE LITE) test strip Use as  instructed 100 each 12  . hydroxypropyl methylcellulose / hypromellose (ISOPTO TEARS / GONIOVISC) 2.5 % ophthalmic solution Place 1-2 drops into both eyes 3 (three) times daily as needed for dry eyes.    Marland Kitchen ibuprofen (ADVIL) 800 MG tablet Take 1 tablet (800 mg total) by mouth every 8 (eight) hours as needed. for pain 30 tablet 1  . Insulin Pen Needle 31G X 5 MM MISC 1 each by Does not apply route 3 (three) times daily before meals. 100 each 5  . Lancets (FREESTYLE) lancets Use 3 times daily to check blood sugar 300 each 1  . lisinopril (ZESTRIL) 10 MG tablet TAKE 1 TABLET (10 MG TOTAL) BY MOUTH DAILY. 90 tablet 1  . metFORMIN (GLUCOPHAGE) 1000 MG tablet TAKE 1 TABLET (1,000 MG TOTAL) BY MOUTH 2 TIMES DAILY WITH A MEAL. 180 tablet 3  . methocarbamol (ROBAXIN) 500 MG tablet methocarbamol 500 mg tablet    . metoprolol succinate (TOPROL-XL) 25 MG 24 hr tablet TAKE 1 TABLET BY MOUTH DAILY BEFORE BREAKFAST 90 tablet 1  . sitaGLIPtin (JANUVIA) 100 MG tablet TAKE 1 TABLET (100 MG TOTAL) BY MOUTH DAILY. 90 tablet 1   No current facility-administered medications on file prior to visit.  Family History  Problem Relation Age of Onset  . Hypertension Mother   . Cancer Mother   . Breast cancer Mother 30  . Breast cancer Maternal Aunt 73  . Breast cancer Cousin 105    Social History   Socioeconomic History  . Marital status: Married    Spouse name: Not on file  . Number of children: Not on file  . Years of education: 65  . Highest education level: Not on file  Occupational History  . Occupation: Research scientist (life sciences): [employer]  Tobacco Use  . Smoking status: Never Smoker  . Smokeless tobacco: Never Used  Substance and Sexual Activity  . Alcohol use: No  . Drug use: No  . Sexual activity: Not on file  Other Topics Concern  . Not on file  Social History Narrative  . Not on file   Social Determinants of Health   Financial Resource Strain: Not on file  Food Insecurity: Not on file   Transportation Needs: Not on file  Physical Activity: Not on file  Stress: Not on file  Social Connections: Not on file  Intimate Partner Violence: Not on file    Review of Systems: ROS negative except for what is noted on the assessment and plan.  Vitals:   12/22/20 1547  BP: 137/77  Pulse: 98  Temp: 98.1 F (36.7 C)  TempSrc: Oral  SpO2: 100%  Weight: 177 lb 9.6 oz (80.6 kg)  Height: 5' (1.524 m)     Physical Exam: Physical Exam   Assessment & Plan:   See Encounters Tab for problem based charting.  Patient discussed with Dr. Lars Mage, D.O. [facility], PGY-2 Pager: [phone], Phone: [phone] Date 12/24/2020 Time 1:50 PM

## 2020-12-23 ENCOUNTER — Telehealth: Payer: Self-pay

## 2020-12-23 ENCOUNTER — Other Ambulatory Visit: Payer: Self-pay | Admitting: Internal Medicine

## 2020-12-23 DIAGNOSIS — E119 Type 2 diabetes mellitus without complications: Secondary | ICD-10-CM

## 2020-12-23 LAB — BMP8+ANION GAP
Anion Gap: 14 mmol/L (ref 10.0–18.0)
BUN/Creatinine Ratio: 21 (ref 12–28)
BUN: 15 mg/dL (ref 8–27)
CO2: 25 mmol/L (ref 20–29)
Calcium: 10.3 mg/dL (ref 8.7–10.3)
Chloride: 99 mmol/L (ref 96–106)
Creatinine, Ser: 0.7 mg/dL (ref 0.57–1.00)
GFR calc Af Amer: 102 mL/min/{1.73_m2} (ref >59)
GFR calc non Af Amer: 88 mL/min/{1.73_m2} (ref >59)
Glucose: 123 mg/dL — ABNORMAL HIGH (ref 65–99)
Potassium: 4.4 mmol/L (ref 3.5–5.2)
Sodium: 138 mmol/L (ref 134–144)

## 2020-12-23 MED ORDER — INSULIN GLARGINE 100 UNIT/ML ~~LOC~~ SOLN: 22.0000 [IU] | Freq: Every day | SUBCUTANEOUS | 11 refills | Status: DC

## 2020-12-23 MED FILL — glipiZIDE XL 5 MG TB24: 5 | 30 days supply | Qty: 30 | Fill #2

## 2020-12-23 MED FILL — JANUVIA 100 MG TABLET: 100 | 30 days supply | Qty: 30 | Fill #4

## 2020-12-23 NOTE — Telephone Encounter (Signed)
Yes that is fine. I will put int he semglee.

## 2020-12-23 NOTE — Telephone Encounter (Signed)
Received TC from [facility] at [facility].  States that patient has been receiving a voucher for [employer] which has brought her copay down to $15.  However, the voucher is no longer available and the medication will now cost $75.  Pharmacist states there is a new biosimular called Semglee which has a copay card available and could be free with the copay card.  [facility] will be adding Semglee to their formulary in April.  Pharmacist wants to know if you will change the RX to [facility] now, its still 100u/ml kwikpen and same dosage, so patient can start using the copay card.  If you have any questions, please call Stanton Kidney at [facility].  (If you send new RX, Stanton Kidney will call patient and let her know about RX change, etc). Thank you, SChaplin, RN,BSN

## 2020-12-24 ENCOUNTER — Encounter: Payer: Self-pay | Admitting: Internal Medicine

## 2020-12-24 MED FILL — SEMGLEE 100 UNIT/ML SOPN: 100 | 81 days supply | Qty: 18 | Fill #0

## 2020-12-24 NOTE — Assessment & Plan Note (Addendum)
Patient presents for further evaluation and management of her DM.   Her DM is uncontrolled on the following medications:  1. Glipizide 5 mg 2. Metformin 1000 mg twice daily 3. Januvia 100 mg 4. Tresiba 24 units daily (apparently never took this medication and switched herself back to Basaglar 22 units nightly  She admits to being compliant with her diabetes medications.   She denies foot ulcerations, nausea, polydipsia, polyuria, visual disturbances, vomitting and weight loss.   She does not have a home glucometer.  She states that her a.m. blood sugars consist of 143, 164, 156, 162, 178.  Current exercise level: Unknown  Current diet: Unknown  Last eye exam was on last year and showed patient reports no retinopathy.   Last foot exam was 6 months ago and showed warm, good capillary refill and reports that this was normal.  Patients current random blood sugar was  Glucose-Capillary  Date/Time Value Ref Range Status  12/22/2020 03:51 PM 151 (H) 70 - 99 mg/dL Final    Comment:    Glucose reference range applies only to samples taken after fasting for at least 8 hours.     Last A1c was  Lab Results  Component Value Date   HGBA1C 8.9 (A) 12/22/2020  .   Patients current weight and BMI:  Filed Weights   12/22/20 1547  Weight: 177 lb 9.6 oz (80.6 kg)     Body mass index is 34.69 kg/m.   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   We identified the following goals during this appointment: daily monitoring and recording of CBG adherence to prescribed medication regimen   Considering this patient's complex medication management and remains uncontrolled, I will set her up for an appointment with [name] Plyler to discuss CGM placement.  Plan: 1. Continue current medication management 2. She will need a diabetic eye exam this year 3. Referral to [facility] for CGM 4. Counseled patient regarding moderate-intensity aerobic  exercise for 30-60 minutes most days 5. Counseled patient regarding weight loss 6. Target Range blood sugar: 100-120 and target range for A1c: <8.   ** Addendum**  Received a message from pharmacy stating that Nancee Liter was too expensive and recommended Semglee which is more affordable.  I will switch patient to this medication at the same dose.  She should get 22 units daily.

## 2020-12-24 NOTE — Assessment & Plan Note (Signed)
Patient presents for follow-up for hypertension she has a blood pressure of 137/77 on metoprolol 25 mg, lisinopril 10 mg, Lasix 40 mg.  Plan: -Continue current antihypertensive medications -BMP today

## 2020-12-27 ENCOUNTER — Encounter (INDEPENDENT_AMBULATORY_CARE_PROVIDER_SITE_OTHER): Payer: No Typology Code available for payment source | Admitting: Ophthalmology

## 2020-12-28 NOTE — Progress Notes (Signed)
Internal Medicine Clinic Attending  Case discussed with Dr. Coe  At the time of the visit.  We reviewed the resident's history and exam and pertinent patient test results.  I agree with the assessment, diagnosis, and plan of care documented in the resident's note.  

## 2020-12-29 ENCOUNTER — Ambulatory Visit (INDEPENDENT_AMBULATORY_CARE_PROVIDER_SITE_OTHER): Payer: No Typology Code available for payment source | Admitting: Ophthalmology

## 2020-12-29 ENCOUNTER — Other Ambulatory Visit: Payer: Self-pay

## 2020-12-29 ENCOUNTER — Encounter (INDEPENDENT_AMBULATORY_CARE_PROVIDER_SITE_OTHER): Payer: No Typology Code available for payment source | Admitting: Ophthalmology

## 2020-12-29 ENCOUNTER — Encounter (INDEPENDENT_AMBULATORY_CARE_PROVIDER_SITE_OTHER): Payer: Self-pay | Admitting: Ophthalmology

## 2020-12-29 DIAGNOSIS — H0014 Chalazion left upper eyelid: Secondary | ICD-10-CM | POA: Diagnosis not present

## 2020-12-29 DIAGNOSIS — E113393 Type 2 diabetes mellitus with moderate nonproliferative diabetic retinopathy without macular edema, bilateral: Secondary | ICD-10-CM | POA: Diagnosis not present

## 2020-12-29 DIAGNOSIS — H2513 Age-related nuclear cataract, bilateral: Secondary | ICD-10-CM | POA: Diagnosis not present

## 2020-12-29 LAB — HM DIABETES EYE EXAM

## 2020-12-29 NOTE — Assessment & Plan Note (Signed)
The nature of cataract was discussed with the patient as well as the elective nature of surgery. The patient was reassured that surgery at a later date does not put the patient at risk for a worse outcome. It was emphasized that the need for surgery is dictated by the patient's quality of life as influenced by the cataract. Patient was instructed to maintain close follow up with their general eye care doctor. I explained to the patient color change to the lens and that she might very well benefit from consideration of cataract traction with intraocular lens placement  She is to follow-up with [facility] in the coming months

## 2020-12-29 NOTE — Patient Instructions (Signed)
The nature of diabetic retinopathy was explained using the following analogy: "Retinopathy develops in the body's blood supply like salty water corrodes the linings of pipes in a house, until rust appears, then holes in the pipes develop which leak followed by destruction and loss of the pipes as the corrosion turns them to dust. In a similar fashion, Diabetes damages the blood supply of the body by cumulative long--term elevated blood sugar, which corrodes the blood supply in the body, particularly the blood vessels supplying the retina, kidneys, and nerves".  Thus, control of blood sugar, slows the progression of the corrosive effect of diabetes mellitus.    

## 2020-12-29 NOTE — Assessment & Plan Note (Signed)
Resolved

## 2020-12-29 NOTE — Assessment & Plan Note (Signed)
>>  ASSESSMENT AND PLAN FOR MODERATE NONPROLIFERATIVE DIABETIC RETINOPATHY OF BOTH EYES WITHOUT MACULAR EDEMA ASSOCIATED WITH TYPE 2 DIABETES MELLITUS (HCC) WRITTEN ON 12/29/2020  3:17 PM BY ELNER KUBA A, MD  Stable stage of nonproliferative diabetic retinopathy.  No active disease.  We will continue to observe

## 2020-12-29 NOTE — Progress Notes (Signed)
12/29/2020     CHIEF COMPLAINT Patient presents for Retina Follow Up (6 Month NPDR f\u PU. OCT/Pt states vision is stable. Denies FOL and floaters./BGL: did not check/A1C: 8.9)   HISTORY OF PRESENT ILLNESS: Michele Andrews is a 70 y.o. female who presents to the clinic today for:   HPI    Retina Follow Up    Patient presents with  Diabetic Retinopathy.  In both eyes.  Severity is moderate.  Duration of 6 months.  Since onset it is stable.  I, the attending physician,  performed the HPI with the patient and updated documentation appropriately. Additional comments: 6 Month NPDR f\u PU. OCT Pt states vision is stable. Denies FOL and floaters. BGL: did not check A1C: 8.9       Last edited by Tilda Franco on 12/29/2020  2:53 PM. (History)      Referring physician: Molli Hazard A, DO [address],  [place] [id number]  HISTORICAL INFORMATION:   Selected notes from the MEDICAL RECORD NUMBER    Lab Results  Component Value Date   HGBA1C 8.9 (A) 12/22/2020     CURRENT MEDICATIONS: Current Outpatient Medications (Ophthalmic Drugs)  Medication Sig  . hydroxypropyl methylcellulose / hypromellose (ISOPTO TEARS / GONIOVISC) 2.5 % ophthalmic solution Place 1-2 drops into both eyes 3 (three) times daily as needed for dry eyes.   No current facility-administered medications for this visit. (Ophthalmic Drugs)   Current Outpatient Medications (Other)  Medication Sig  . Accu-Chek FastClix Lancets MISC USE TO CHECK BLOOD SUGAR 2 TIMES A DAY  . ACCU-CHEK GUIDE test strip USE 3 TO 4 TIMES DAILY TO CHECK BLOOD SUGAR  . acetaminophen (TYLENOL) 650 MG CR tablet Take 650 mg by mouth every 8 (eight) hours as needed for pain.  Marland Kitchen atorvastatin (LIPITOR) 40 MG tablet Take 1 tablet (40 mg total) by mouth daily.  . Blood Glucose Monitoring Suppl (FREESTYLE FREEDOM LITE) w/Device KIT 1 each by Does not apply route 3 (three) times daily before meals.  . Blood Glucose Monitoring Suppl  (TRUETRACK BLOOD GLUCOSE) W/DEVICE KIT Use to check blood sugar 2 times a day   . Cholecalciferol (VITAMIN D3) 2000 units TABS Take 2,000 Units by mouth daily.  . diclofenac (VOLTAREN) 75 MG EC tablet Take 1 tablet (75 mg total) by mouth 2 (two) times daily.  . diclofenac Sodium (VOLTAREN) 1 % GEL APPLY 2 GRAMS TOPICALLY 4 TIMES DAILY.  . fluticasone (FLONASE) 50 MCG/ACT nasal spray Place into the nose.  . furosemide (LASIX) 40 MG tablet TAKE 1 TABLET (40 MG TOTAL) BY MOUTH DAILY.  Marland Kitchen glipiZIDE (GLUCOTROL XL) 5 MG 24 hr tablet Take 1 tablet (5 mg total) by mouth daily with breakfast.  . glucose blood (FREESTYLE LITE) test strip Use as instructed  . ibuprofen (ADVIL) 800 MG tablet Take 1 tablet (800 mg total) by mouth every 8 (eight) hours as needed. for pain  . insulin glargine (SEMGLEE) 100 UNIT/ML injection Inject 0.22 mLs (22 Units total) into the skin at bedtime.  . Insulin Pen Needle 31G X 5 MM MISC 1 each by Does not apply route 3 (three) times daily before meals.  . Lancets (FREESTYLE) lancets Use 3 times daily to check blood sugar  . lisinopril (ZESTRIL) 10 MG tablet TAKE 1 TABLET (10 MG TOTAL) BY MOUTH DAILY.  . metFORMIN (GLUCOPHAGE) 1000 MG tablet TAKE 1 TABLET (1,000 MG TOTAL) BY MOUTH 2 TIMES DAILY WITH A MEAL.  . methocarbamol (ROBAXIN) 500  MG tablet methocarbamol 500 mg tablet  . metoprolol succinate (TOPROL-XL) 25 MG 24 hr tablet TAKE 1 TABLET BY MOUTH DAILY BEFORE BREAKFAST  . sitaGLIPtin (JANUVIA) 100 MG tablet TAKE 1 TABLET (100 MG TOTAL) BY MOUTH DAILY.   No current facility-administered medications for this visit. (Other)      REVIEW OF SYSTEMS: ROS    Positive for: Endocrine   Last edited by Tilda Franco on 12/29/2020  2:53 PM. (History)       ALLERGIES No Known Allergies  PAST MEDICAL HISTORY Past Medical History:  Diagnosis Date  . Carpal tunnel syndrome   . Diabetes mellitus    Type II- diagnosed 30 yrs. ago  . Family history of adverse reaction to  anesthesia    20 YRS. AGO, HER 20 YR. OLD NEPHEW, ENDED UP ON VENTILATOR- PT. THINKS ITS BECAUSE HE ASPIRATED   . Hypercalcemia   . Hyperlipidemia   . Hypertension   . Osteopenia   . Rotator cuff syndrome of right shoulder    Past Surgical History:  Procedure Laterality Date  . ABDOMINAL HYSTERECTOMY    . BREAST CYST EXCISION Left   . BREAST SURGERY Left 1963   cyst removed   . PARATHYROIDECTOMY Right 11/08/2015  . PARATHYROIDECTOMY N/A 11/08/2015   Procedure: RIGHT INFERIOR PARATHYROIDECTOMY;  Surgeon: Armandina Gemma, MD;  Location: [facility] OR;  Service: General;  Laterality: N/A;    FAMILY HISTORY Family History  Problem Relation Age of Onset  . Hypertension Mother   . Cancer Mother   . Breast cancer Mother 32  . Breast cancer Maternal Aunt 33  . Breast cancer Cousin 75    SOCIAL HISTORY Social History   Tobacco Use  . Smoking status: Never Smoker  . Smokeless tobacco: Never Used  Substance Use Topics  . Alcohol use: No  . Drug use: No         OPHTHALMIC EXAM:  Base Eye Exam    Visual Acuity (Snellen - Linear)      Right Left   Dist cc 20/30 -2 20/30 +2   Correction: Glasses       Tonometry (Tonopen, 2:56 PM)      Right Left   Pressure 19 16       Pupils      Pupils Dark Light Shape React APD   Right PERRL 4 3 Round Slow None   Left PERRL 4 3 Round Slow None       Visual Fields (Counting fingers)      Left Right    Full Full       Neuro/Psych    Oriented x3: Yes   Mood/Affect: Normal       Dilation    Both eyes: 1.0% Mydriacyl, 2.5% Phenylephrine @ 2:57 PM        Slit Lamp and Fundus Exam    External Exam      Right Left   External Normal Normal       Slit Lamp Exam      Right Left   Lids/Lashes Normal Left upper lid, nodular thickening though not an overt chalazion.  Inspissated meibomian glands in the nasal aspect of the upper lid which did mobilize with some expression of meibomian gland discharge, nonpurulent.  Area is somewhat  tender with compression with a Q-tip   Conjunctiva/Sclera White and quiet White and quiet   Cornea Clear Clear   Anterior Chamber Deep and quiet Deep and quiet   Iris Round and reactive Round and  reactive   Lens 3+ Nuclear sclerosis, Cortical cataract 3+ Nuclear sclerosis, Cortical cataract   Anterior Vitreous Normal Normal       Fundus Exam      Right Left   Posterior Vitreous Normal Normal   Disc Normal Normal   C/D Ratio 0.25 0.2   Macula no clinically significant macular edema, no macular thickening, Microaneurysms no clinically significant macular edema, no macular thickening, Microaneurysms   Vessels NPDR- Moderate NPDR- Mild   Periphery Normal Normal          IMAGING AND PROCEDURES  Imaging and Procedures for 12/29/20  OCT, Retina - OU - Both Eyes       Right Eye Quality was good. Scan locations included subfoveal. Central Foveal Thickness: 299. Progression has been stable. Findings include normal foveal contour.   Left Eye Quality was good. Scan locations included subfoveal. Central Foveal Thickness: 288. Progression has been stable. Findings include normal foveal contour.                 ASSESSMENT/PLAN:  Nuclear sclerotic cataract of both eyes The nature of cataract was discussed with the patient as well as the elective nature of surgery. The patient was reassured that surgery at a later date does not put the patient at risk for a worse outcome. It was emphasized that the need for surgery is dictated by the patient's quality of life as influenced by the cataract. Patient was instructed to maintain close follow up with their general eye care doctor. I explained to the patient color change to the lens and that she might very well benefit from consideration of cataract traction with intraocular lens placement  She is to follow-up with [facility] in the coming months  Chalazion left upper eyelid Resolved  Moderate nonproliferative diabetic retinopathy  of both eyes without macular edema associated with type 2 diabetes mellitus (HCC) Stable stage of nonproliferative diabetic retinopathy.  No active disease.  We will continue to observe      ICD-10-CM   1. Moderate nonproliferative diabetic retinopathy of both eyes without macular edema associated with type 2 diabetes mellitus (HCC)  D35.7017 OCT, Retina - OU - Both Eyes  2. Nuclear sclerotic cataract of both eyes  H25.13   3. Chalazion left upper eyelid  H00.14     1.  Patient promises me that she will improve her blood sugar control to lower the hemoglobin A1c from the mid to high nines down to below 8  2.  3.  Ophthalmic Meds Ordered this visit:  No orders of the defined types were placed in this encounter.      Return in about 6 months (around 06/28/2021).  There are no Patient Instructions on file for this visit.   Explained the diagnoses, plan, and follow up with the patient and they expressed understanding.  Patient expressed understanding of the importance of proper follow up care.   Clent Demark Jaceion Aday M.D. Diseases & Surgery of the Retina and Vitreous Retina & Diabetic [place] 12/29/20     Abbreviations: M myopia (nearsighted); A astigmatism; H hyperopia (farsighted); P presbyopia; Mrx spectacle prescription;  CTL contact lenses; OD right eye; OS left eye; OU both eyes  XT exotropia; ET esotropia; PEK punctate epithelial keratitis; PEE punctate epithelial erosions; DES dry eye syndrome; MGD meibomian gland dysfunction; ATs artificial tears; PFAT's preservative free artificial tears; [place] nuclear sclerotic cataract; PSC posterior subcapsular cataract; ERM epi-retinal membrane; PVD posterior vitreous detachment; RD retinal detachment; DM diabetes mellitus;  DR diabetic retinopathy; NPDR non-proliferative diabetic retinopathy; PDR proliferative diabetic retinopathy; CSME clinically significant macular edema; DME diabetic macular edema; dbh dot blot hemorrhages; CWS cotton wool  spot; POAG primary open angle glaucoma; C/D cup-to-disc ratio; HVF humphrey visual field; GVF goldmann visual field; OCT optical coherence tomography; IOP intraocular pressure; BRVO Branch retinal vein occlusion; CRVO central retinal vein occlusion; CRAO central retinal artery occlusion; BRAO branch retinal artery occlusion; RT retinal tear; SB scleral buckle; PPV pars plana vitrectomy; VH Vitreous hemorrhage; PRP panretinal laser photocoagulation; IVK intravitreal kenalog; VMT vitreomacular traction; MH Macular hole;  NVD neovascularization of the disc; NVE neovascularization elsewhere; AREDS age related eye disease study; ARMD age related macular degeneration; POAG primary open angle glaucoma; EBMD epithelial/anterior basement membrane dystrophy; ACIOL anterior chamber intraocular lens; IOL intraocular lens; PCIOL posterior chamber intraocular lens; Phaco/IOL phacoemulsification with intraocular lens placement; [place] photorefractive keratectomy; LASIK laser assisted in situ keratomileusis; HTN hypertension; DM diabetes mellitus; COPD chronic obstructive pulmonary disease

## 2020-12-29 NOTE — Assessment & Plan Note (Signed)
Stable stage of nonproliferative diabetic retinopathy.  No active disease.  We will continue to observe

## 2021-01-13 ENCOUNTER — Ambulatory Visit (INDEPENDENT_AMBULATORY_CARE_PROVIDER_SITE_OTHER): Payer: No Typology Code available for payment source | Admitting: Dietician

## 2021-01-13 ENCOUNTER — Other Ambulatory Visit: Payer: Self-pay

## 2021-01-13 DIAGNOSIS — E119 Type 2 diabetes mellitus without complications: Secondary | ICD-10-CM

## 2021-01-13 DIAGNOSIS — Z794 Long term (current) use of insulin: Secondary | ICD-10-CM

## 2021-01-13 NOTE — Progress Notes (Signed)
  Medical Nutrition Therapy:  Appt start time: 1500 end time:  4982. Total time: 30 Visit # 2  Assessment:  Primary concerns today: diabetes ("I liked doing that CGM") and weight.  175.6# today. She weighs daily at home. Wants to weigh in the 150s. Her spouse is now eating healthier with her. She knows she needs to eat more vegetables and wants to work on doing this.  Would like to use a personal CGM. Her phone was not compatible for her to use samples. Per [facility] her insurance covers a Dexcom  at 100% through [employer].. Preferred Learning Style: No preference indicated  Learning Readiness: Change in progress  ANTHROPOMETRICS:Estimated body mass index is 34.69 kg/m as calculated from the following:   Height as of 12/22/20: 5' (1.524 m).   Weight as of 12/22/20: 177 lb 9.6 oz (80.6 kg).  WEIGHT HISTORY:  Wt Readings from Last 10 Encounters:  12/22/20 177 lb 9.6 oz (80.6 kg)  10/25/20 173 lb 9.6 oz (78.7 kg)  09/08/20 172 lb 8 oz (78.2 kg)  08/11/20 178 lb 4.8 oz (80.9 kg)  06/29/20 176 lb 1.6 oz (79.9 kg)  04/28/20 178 lb 9.6 oz (81 kg)  03/30/20 179 lb 11.2 oz (81.5 kg)  12/26/19 183 lb 8 oz (83.2 kg)  10/15/19 177 lb 8 oz (80.5 kg)  05/29/19 180 lb 6.4 oz (81.8 kg)   SLEEP:need to assess at future visit  MEDICATIONS: Diabetes medicines: 24 units of semglee daily (she increased it because her blood sugars that went higher when glipizide was reduced,  5 mg glipizide,  Metformin 1 g bid;  Januvia 100 mg daily. Did not tolerate Jardiance. She is interested in trying a GLP-1. Truclicity is covered at 100% because she participates with active health management through [place] and is insured by them,.   BLOOD SUGAR: Lab Results  Component Value Date   HGBA1C 8.9 (A) 12/22/2020   HGBA1C 8.2 (A) 09/08/2020   HGBA1C 8.6 (A) 04/28/2020   HGBA1C 7.7 (A) 10/15/2019   HGBA1C 7.7 (A) 05/22/2019     DIETARY INTAKE: she did not want to discuss this in detail. She wants to  incorporate more vegetables so we talked about ways to do this. Recipes and written information provided  Usual physical activity: her work can be active  Progress Towards Goal(s):  In progress.   Nutritional Diagnosis:  NB-1.1 Food and nutrition-related knowledge deficit As related to not knowing how different foods and diabetes medicinces affect her glucose and feels this information will guide her to make changes.  As evidenced by her report and questions.    Intervention:  Nutrition education about CGM and GLP-1s per her request. Recipes to help with adding more vegetables provided. Action Goal: pursue CGM and look into GLp-1 start if her doctor agrees  Outcome goal: improved knowledge about diabetes self management Coordination of care: spoke with the RED team about starting her on a GLP-1 lows dose then increase as tolerated( Trulicity is covered by her insurance at 100%). Would dc glipizide or decrease insulin by 5- 10 units  Teaching Method Utilized: Visual, Auditory,Hands on Handouts given during visit include:avs Barriers to learning/adherence to lifestyle change: competing values Demonstrated degree of understanding via:  Teach Back   Monitoring/Evaluation:  Dietary intake, exercise, meter and body weight in 4 week(s). Michele Andrews, RD 01/14/2021 12:40 PM.

## 2021-01-14 NOTE — Patient Instructions (Signed)
Michele Andrews,   Good to see you again today!   I am happy to assist you:   1- I will start process for CGM through [facility] per [facility] Diabetes Condition Management  2- I will also speak with your doctors about your desire to try a GLP-1 diabetes medicine  Let's follow up when you get your Continuous glucose monitoring supplies or sooner if needed.  Please do not hesitate to call for questions or concerns.    [name] [phone]

## 2021-01-17 ENCOUNTER — Telehealth: Payer: Self-pay | Admitting: Dietician

## 2021-01-17 NOTE — Telephone Encounter (Addendum)
Received a call back from [facility] after faxing them chart notes and the order for a personal CGM on Friday. Their voicemail said they were not in-network with the patient's plan and she ha not out of network benefits. Marland Kitchen

## 2021-01-18 NOTE — Telephone Encounter (Signed)
Wells Guiles from [facility] called and left a voicemail that she confirmed that they are out of network with this patient's insurance plan and there were no benefits they can access. They can help refer [place] to an in network provider. I called her and requested that assistance.

## 2021-01-19 MED FILL — JANUVIA 100 MG TABLET: 100 | 30 days supply | Qty: 30 | Fill #5

## 2021-01-19 MED FILL — glipiZIDE XL 5 MG TB24: 5 | 30 days supply | Qty: 30 | Fill #3

## 2021-01-20 ENCOUNTER — Other Ambulatory Visit: Payer: Self-pay | Admitting: Internal Medicine

## 2021-01-20 ENCOUNTER — Other Ambulatory Visit: Payer: Self-pay | Admitting: Student

## 2021-01-20 DIAGNOSIS — I10 Essential (primary) hypertension: Secondary | ICD-10-CM

## 2021-01-20 MED FILL — METOPROLOL SUCCINATE ER 25: 25 | 90 days supply | Qty: 90 | Fill #0

## 2021-01-20 MED FILL — LISINOPRIL 10 MG TABS: 10 | 90 days supply | Qty: 90 | Fill #1

## 2021-01-21 NOTE — Telephone Encounter (Signed)
Wells Guiles form [facility] could not help with an in [place] because CGM is not covered for Ms Dematteo because she is not on 3 injections daily. I called and informed Ms. Cleland that a personal CGM is not covered by her insurance and that a professional one is. She acknowledged understanding.

## 2021-01-27 ENCOUNTER — Other Ambulatory Visit: Payer: Self-pay | Admitting: Student

## 2021-01-27 ENCOUNTER — Telehealth: Payer: Self-pay | Admitting: Dietician

## 2021-01-27 MED ORDER — TRULICITY 0.75 MG/0.5ML ~~LOC~~ SOAJ: 0.7500 mg | SUBCUTANEOUS | 0 refills | Status: DC

## 2021-01-27 NOTE — Telephone Encounter (Signed)
Michele Andrews was contacted per conversation with Dr.Johnson about her starting a GLP-1 (and stopping her DPP-4i) at the same time she does professional CGM. Michele Andrews was scheduled  for Thursday 02/03/21 at 315 PM to place a CGM sensor and would like to pick up her new medicine and bring it with her to her appointment.

## 2021-01-27 NOTE — Telephone Encounter (Signed)
Januvia 100mg  daily discontinued. Trulicity 0.75mg  weekly ordered

## 2021-01-28 MED FILL — TRULICITY 0.75 MG/0.5 ML PE: 0.75 | 28 days supply | Qty: 2 | Fill #0

## 2021-02-01 ENCOUNTER — Other Ambulatory Visit: Payer: Self-pay | Admitting: Internal Medicine

## 2021-02-01 ENCOUNTER — Other Ambulatory Visit: Payer: Self-pay | Admitting: Student

## 2021-02-01 DIAGNOSIS — M25512 Pain in left shoulder: Secondary | ICD-10-CM

## 2021-02-01 DIAGNOSIS — M1712 Unilateral primary osteoarthritis, left knee: Secondary | ICD-10-CM

## 2021-02-02 ENCOUNTER — Other Ambulatory Visit: Payer: Self-pay | Admitting: Student

## 2021-02-02 MED FILL — IBUPROFEN 800 MG TABS: 800 | 10 days supply | Qty: 30 | Fill #0

## 2021-02-02 MED FILL — DICLOFENAC SODIUM 1 % GEL: 1 | 25 days supply | Qty: 200 | Fill #0

## 2021-02-03 ENCOUNTER — Ambulatory Visit: Payer: No Typology Code available for payment source | Admitting: Dietician

## 2021-02-10 ENCOUNTER — Other Ambulatory Visit: Payer: Self-pay

## 2021-02-10 ENCOUNTER — Encounter: Payer: Self-pay | Admitting: Dietician

## 2021-02-10 ENCOUNTER — Ambulatory Visit (INDEPENDENT_AMBULATORY_CARE_PROVIDER_SITE_OTHER): Payer: No Typology Code available for payment source | Admitting: Dietician

## 2021-02-10 DIAGNOSIS — E119 Type 2 diabetes mellitus without complications: Secondary | ICD-10-CM

## 2021-02-10 DIAGNOSIS — Z794 Long term (current) use of insulin: Secondary | ICD-10-CM | POA: Diagnosis not present

## 2021-02-10 NOTE — Patient Instructions (Addendum)
[name]- Thank you for your visit today!   Trullicity - Start this Sunday 1- remove grey bottom 2- unlock  3- press injection button and hold until you hear the 2nd click.     Stop your Januvia per Dr. Wynetta Emery when you start Trulicity.     . Dulaglutide (TRULICITY) 7.19 LV/7.4XV SOPN Inject 0.75 mg into the skin once a week. 3.5 mL 0  .      .    1  . glipiZIDE (GLUCOTROL XL) 5 MG 24 hr tablet Take 1 tablet (5 mg total) by mouth daily with breakfast. 30 tablet 5  .      .      . insulin glargine (SEMGLEE) 100 UNIT/ML injection Inject 0.22 mLs (22 Units total) into the skin at bedtime. 10 mL 11  .   100 each 5  .   300 each 1  .   90 tablet 1  . metFORMIN (GLUCOPHAGE) 1000 MG tablet TAKE 1 TABLET (1,000 MG TOTAL) BY MOUTH 2 TIMES DAILY WITH A MEAL. 180 tablet 3  .           [name] [phone]

## 2021-02-10 NOTE — Progress Notes (Signed)
Diabetes Self-Management Education  Visit Type: First/Initial  Appt. Start Time: 1455 Appt. End Time: 2423  [name], identified by name and date of birth, is a 70 y.o. female with a diagnosis of Diabetes:  .   ASSESSMENT  Weight 177 lb 6.4 oz (80.5 kg). Body mass index is 34.65 kg/m.    MEDICATIONS: Diabetes medicines: 22 units of semglee daily,  5 mg glipizide,  Metformin 1 g bid; To begin Trulcity on Sunday.    BLOOD SUGAR: Lab Results  Component Value Date   HGBA1C 8.9 (A) 12/22/2020   HGBA1C 8.2 (A) 09/08/2020   HGBA1C 8.6 (A) 04/28/2020   HGBA1C 7.7 (A) 10/15/2019   HGBA1C 7.7 (A) 05/22/2019    DIETARY INTAKE: she reports drinking more water( trying to reduce her diet soda intake) and eating dinner earlier at night   Usual physical activity: her work can be active and she is planning to take 30 minute walks   Diabetes Self-Management Education - 02/10/21 1500      Visit Information   Visit Type First/Initial      Initial Visit   Are you currently following a meal plan? Yes    What type of meal plan do you follow? balanced meals    Are you taking your medications as prescribed? Yes      Psychosocial Assessment   Patient Belief/Attitude about Diabetes Motivated to manage diabetes    Self-care barriers None    Other persons present Patient;Family Member    Patient Concerns Weight Control;Glycemic Control    Special Needs None    Preferred Learning Style No preference indicated    Learning Readiness Ready      Pre-Education Assessment   Patient understands incorporating nutritional management into lifestyle. Needs Review    Patient understands using medications safely. Needs Instruction    Patient understands monitoring blood glucose, interpreting and using results Needs Review      Complications   Last HgB A1C per patient/outside source 8.9 %    How often do you check your blood sugar? 1-2 times/day    Fasting Blood glucose range (mg/dL)  70-129    Postprandial Blood glucose range (mg/dL) 130-179    Number of hypoglycemic episodes per month 0    Have you had a dilated eye exam in the past 12 months? Yes    Have you had a dental exam in the past 12 months? Yes    Are you checking your feet? Yes    How many days per week are you checking your feet? 7      Exercise   Exercise Type ADL's;Light (walking / raking leaves)      Patient Education   Previous Diabetes Education Yes (please comment)   here and NDES   Medications Reviewed patients medication for diabetes, action, purpose, timing of dose and side effects.    Acute complications Taught treatment of hypoglycemia - the 15 rule.      Individualized Goals (developed by patient)   Medications take my medication as prescribed      Outcomes   Expected Outcomes Demonstrated interest in learning. Expect positive outcomes    Future DMSE 2 wks    Program Status Not Completed           Individualized Plan for Diabetes Self-Management Training:   Learning Objective:  Patient will have a greater understanding of diabetes self-management. Patient education plan is to attend individual and/or group sessions per assessed needs and concerns.  Plan:   Patient Instructions   [place]- Thank you for your visit today!   Trullicity - Start this Sunday 1- remove grey bottom 2- unlock  3- press injection button and hold until you hear the 2nd click.     Stop your Januvia per Dr. Wynetta Emery when you start Trulicity.     . Dulaglutide (TRULICITY) 6.28 BT/5.1VO SOPN Inject 0.75 mg into the skin once a week. 3.5 mL 0  .      .    1  . glipiZIDE (GLUCOTROL XL) 5 MG 24 hr tablet Take 1 tablet (5 mg total) by mouth daily with breakfast. 30 tablet 5  .      .      . insulin glargine (SEMGLEE) 100 UNIT/ML injection Inject 0.22 mLs (22 Units total) into the skin at bedtime. 10 mL 11  .   100 each 5  .   300 each 1  .   90 tablet 1  . metFORMIN (GLUCOPHAGE) 1000 MG tablet TAKE 1  TABLET (1,000 MG TOTAL) BY MOUTH 2 TIMES DAILY WITH A MEAL. 180 tablet 3  .           [name] [phone]  Expected Outcomes:  Demonstrated interest in learning. Expect positive outcomes Education material provided: Diabetes Resources If problems or questions, patient to contact team via:  Phone Future DSME appointment: 2 wks  Debera Lat, [place] 02/10/2021 4:08 PM.

## 2021-02-14 ENCOUNTER — Ambulatory Visit: Payer: No Typology Code available for payment source | Admitting: Dietician

## 2021-02-14 ENCOUNTER — Other Ambulatory Visit: Payer: Self-pay

## 2021-02-14 ENCOUNTER — Encounter: Payer: Self-pay | Admitting: Dietician

## 2021-02-14 MED FILL — METFORMIN HCL 1000 MG TABS: 1000 | 90 days supply | Qty: 180 | Fill #1

## 2021-02-14 MED FILL — FUROSEMIDE 40 MG TAB: 40 | 90 days supply | Qty: 90 | Fill #1

## 2021-02-14 MED FILL — glipiZIDE XL 5 MG TB24: 5 | 30 days supply | Qty: 30 | Fill #4

## 2021-02-14 NOTE — Progress Notes (Signed)
Documentation for Freestyle Libre Pro Continuous glucose monitoring per Dr. Durenda Age order.  Freestyle Libre Pro CGM sensor placed today. Patient was educated about wearing sensor, keeping food, activity and medication log and when to call office. Patient was educated about how to care for the sensor and not to have an MRI, CT or Diathermy while wearing the sensor.  Ms. Oyer confirms that she took hr first dose of Trulicity yesterday without problem. She also confirms that she keeps her meter and a treatment for low blood sugars with her.   Follow up was arranged with the patient for 1 week.   Lot #: [id number] Serial #: [id number] Expiration Date: 05-10-2021  Debera Lat, RD 02/14/2021 2:46 PM.

## 2021-02-14 NOTE — Patient Instructions (Addendum)
   Please record the time, amount and what food drinks and activities you have while wearing the continuous glucose monitor (CGM).  Bring the folder with you to follow up appointments. If your monitor falls off, please place it in the bag provided in your folder and bring it back with you to your next appointment.   Do not have a CT or an MRI while wearing the CGM.   Please stop at the desk to set up a 1 week visit with me and a doctor for the first of two CGM downloads.   You will also return in 2 weeks to have your second download and the CGM removed.  [name] [phone]

## 2021-02-15 ENCOUNTER — Telehealth: Payer: Self-pay

## 2021-02-15 NOTE — Telephone Encounter (Signed)
Return cal - no answer; left message to call the office.

## 2021-02-15 NOTE — Telephone Encounter (Signed)
Pls contact Dr Kaleen Mask [phone]

## 2021-02-21 ENCOUNTER — Encounter: Payer: No Typology Code available for payment source | Admitting: Dietician

## 2021-02-21 ENCOUNTER — Encounter: Payer: No Typology Code available for payment source | Admitting: Internal Medicine

## 2021-02-23 ENCOUNTER — Other Ambulatory Visit: Payer: Self-pay | Admitting: Internal Medicine

## 2021-02-23 DIAGNOSIS — I10 Essential (primary) hypertension: Secondary | ICD-10-CM

## 2021-02-28 ENCOUNTER — Encounter: Payer: Self-pay | Admitting: Dietician

## 2021-02-28 ENCOUNTER — Encounter: Payer: No Typology Code available for payment source | Admitting: Internal Medicine

## 2021-02-28 ENCOUNTER — Ambulatory Visit (INDEPENDENT_AMBULATORY_CARE_PROVIDER_SITE_OTHER): Payer: No Typology Code available for payment source | Admitting: Dietician

## 2021-02-28 DIAGNOSIS — Z713 Dietary counseling and surveillance: Secondary | ICD-10-CM | POA: Diagnosis not present

## 2021-02-28 DIAGNOSIS — Z794 Long term (current) use of insulin: Secondary | ICD-10-CM

## 2021-02-28 DIAGNOSIS — E119 Type 2 diabetes mellitus without complications: Secondary | ICD-10-CM

## 2021-02-28 NOTE — Patient Instructions (Signed)
Thank you for your visit today.   Your blood sugars are improving with no lows!  Your a1C can be done again on 03-22-20.   You can schedule with Dr Sharon Seller and I on the same day if you'd like.   I will ask Dr. Sharon Seller to to send in the new dose of Trulicity.   Call us if you have any low blood sugars.   I like your goal-  of checking your blood sugar 2 hours after breakfast and dinner. goal is less than [place] [phone]

## 2021-02-28 NOTE — Progress Notes (Addendum)
Diabetes Self-Management Education  Visit Type: Follow-up  Appt. Start Time: 1445 Appt. End Time: 6659  [name], identified by name and date of birth, is a 70 y.o. female with a diagnosis of Diabetes:  .   ASSESSMENT  Reports no nausea or negative side affects from Trulicity.  she did not want to weigh today, but says she wants to decrease her weight and start weighing regularly.    MEDICATIONS: Diabetes medicines: 22 units of semglee daily,  5 mg glipizide,  Metformin 1 g bid; 0.75mg  Trulcity on Sunday.    BLOOD SUGAR: Lab Results  Component Value Date   HGBA1C 8.9 (A) 12/22/2020   HGBA1C 8.2 (A) 09/08/2020   HGBA1C 8.6 (A) 04/28/2020   HGBA1C 7.7 (A) 10/15/2019   HGBA1C 7.7 (A) 05/22/2019     CGM Results from download:   Average glucose:   179 mg/dL for 15 days  Glucose management indicator:   7.6 %  Time in range (70-180 mg/dL):   51 %   (Goal >70%)  Time High (181-250 mg/dL):   42 %   (Goal < 25%)  Time Very High (>250 mg/dL):    7 %   (Goal < 5%)  Time Low (54-69 mg/dL):   0 %   (Goal <4%)  Time Very Low (<54 mg/dL):   0 %   (Goal <1%)  Coefficient of variation:   27.3 %   (Goal <36%)    DIETARY INTAKE: she reports drinking more water( trying to reduce her diet soda intake) and eating dinner earlier at night   Usual physical activity: her work can be active and she is planning to take 30 minute walks   Diabetes Self-Management Education - 02/28/21 1500      Visit Information   Visit Type Follow-up      Initial Visit   Are you currently following a meal plan? Yes    What type of meal plan do you follow? balanced meals    Are you taking your medications as prescribed? Yes      Health Coping   How would you rate your overall health? Fair      Dietary Intake   Breakfast Coffee w/ honey and ginger (6am), 6 graham crackers, peanut butter, 2 containers apple sauce    Lunch fried chicken, cabbage, baked sweet potatoes, diet Coke (noon)    Snack  (afternoon) canned peaches    Dinner lamb, cabbage, baked sweet potato, water (7pm)      Patient Education   Previous Diabetes Education Yes (please comment)    Nutrition management  Role of diet in the treatment of diabetes and the relationship between the three main macronutrients and blood glucose level;Reviewed blood glucose goals for pre and post meals and how to evaluate the patients' food intake on their blood glucose level.    Monitoring Taught/discussed recording of test results and interpretation of SMBG.;Other (comment)   explained CGM report     Patient Self-Evaluation of Goals - Patient rates self as meeting previously set goals (% of time)   Medications >75%      Outcomes   Expected Outcomes Demonstrated interest in learning. Expect positive outcomes    Future DMSE 4-6 wks   3 weeks   Program Status Completed      Subsequent Visit   Since your last visit have you continued or begun to take your medications as prescribed? Yes    Since your last visit have you had  your blood pressure checked? No    Since your last visit have you experienced any weight changes? --   did not want to weigh today   Since your last visit, are you checking your blood glucose at least once a day? Yes   did not bring meter or food records          Individualized Plan for Diabetes Self-Management Training:   Learning Objective:  Patient will have a greater understanding of diabetes self-management. Patient education plan is to attend individual and/or group sessions per assessed needs and concerns.   Plan:   Patient Instructions  Thank you for your visit today.   Your blood sugars are improving with no lows!  Your a1C can be done again on 03-22-20.   You can schedule with Dr Sharon Seller and I on the same day if you'd like.   I will ask Dr. Sharon Seller to to send in the new dose of Trulicity.   Call us if you have any low blood sugars.   I like your goal-  of checking your blood sugar 2 hours  after breakfast and dinner. goal is less than [place] [phone]  Expected Outcomes:  Demonstrated interest in learning. Expect positive outcomes Education material provided: Diabetes Resources If problems or questions, patient to contact team via:  Phone Future DSME appointment: 4-6 wks (3 weeks)  Debera Lat, RD 02/28/2021 3:48 PM.

## 2021-02-28 NOTE — Progress Notes (Deleted)
   CC: type II Diabetes   HPI:  [name] is a 70 y.o. with PMH as below.   Please see A&P for assessment of the patient's acute and chronic medical conditions.   Last a1c 8.9. Today ***. Current medications include Trulicity .75 mg  Qweekly, glipizide 5 mg qd,   Past Medical History:  Diagnosis Date  . Carpal tunnel syndrome   . Diabetes mellitus    Type II- diagnosed 30 yrs. ago  . Family history of adverse reaction to anesthesia    20 YRS. AGO, HER 20 YR. OLD NEPHEW, ENDED UP ON VENTILATOR- PT. THINKS ITS BECAUSE HE ASPIRATED   . Hypercalcemia   . Hyperlipidemia   . Hypertension   . Osteopenia   . Rotator cuff syndrome of right shoulder    Review of Systems:  *** 10 point ROS negative except as noted in HPI  Physical Exam:   Constitution: NAD, appears stated age HENT: Eyes:  Cardio: RRR, no m/r/g, no LE edema  Respiratory: CTA, no w/r/r Abdominal: NTTP, soft, non-distended MSK: moving all extremities Neuro: normal affect, a&ox3 GU: Skin: c/d/i    There were no vitals filed for this visit. ***  Assessment & Plan:   See Encounters Tab for problem based charting.  Patient {GC/GE:3044014::"discussed with","seen with"} Dr. {NAMES:[id number]::"Butcher","Granfortuna","E. Hoffman","Klima","Mullen","Narendra","Raines","Vincent"}

## 2021-03-01 ENCOUNTER — Other Ambulatory Visit: Payer: Self-pay | Admitting: Internal Medicine

## 2021-03-01 DIAGNOSIS — I10 Essential (primary) hypertension: Secondary | ICD-10-CM

## 2021-03-07 ENCOUNTER — Other Ambulatory Visit: Payer: Self-pay | Admitting: Internal Medicine

## 2021-03-07 DIAGNOSIS — E119 Type 2 diabetes mellitus without complications: Secondary | ICD-10-CM

## 2021-03-07 DIAGNOSIS — Z794 Long term (current) use of insulin: Secondary | ICD-10-CM

## 2021-03-07 MED ORDER — TRULICITY 1.5 MG/0.5ML ~~LOC~~ SOAJ: 1.5000 mg | SUBCUTANEOUS | 12 refills | Status: DC

## 2021-03-07 MED FILL — TRULICITY 1.5 MG/0.5 ML PEN: 1.5 | 28 days supply | Qty: 2 | Fill #0

## 2021-03-09 MED FILL — SEMGLEE (YFGN) 100 UNIT/ML: 100 | 81 days supply | Qty: 18 | Fill #0

## 2021-03-22 ENCOUNTER — Encounter: Payer: Self-pay | Admitting: Internal Medicine

## 2021-03-22 ENCOUNTER — Ambulatory Visit: Payer: No Typology Code available for payment source | Admitting: Dietician

## 2021-03-22 ENCOUNTER — Encounter: Payer: Self-pay | Admitting: Dietician

## 2021-03-22 ENCOUNTER — Other Ambulatory Visit (HOSPITAL_COMMUNITY): Payer: Self-pay

## 2021-03-22 ENCOUNTER — Other Ambulatory Visit: Payer: Self-pay

## 2021-03-22 ENCOUNTER — Ambulatory Visit (INDEPENDENT_AMBULATORY_CARE_PROVIDER_SITE_OTHER): Payer: No Typology Code available for payment source | Admitting: Internal Medicine

## 2021-03-22 VITALS — BP 133/85 | HR 98 | Temp 98.3°F | Ht 65.0 in | Wt 175.2 lb

## 2021-03-22 DIAGNOSIS — I10 Essential (primary) hypertension: Secondary | ICD-10-CM

## 2021-03-22 DIAGNOSIS — E119 Type 2 diabetes mellitus without complications: Secondary | ICD-10-CM

## 2021-03-22 DIAGNOSIS — Z794 Long term (current) use of insulin: Secondary | ICD-10-CM | POA: Diagnosis not present

## 2021-03-22 DIAGNOSIS — E785 Hyperlipidemia, unspecified: Secondary | ICD-10-CM

## 2021-03-22 LAB — POCT GLYCOSYLATED HEMOGLOBIN (HGB A1C): Hemoglobin A1C: 8.3 % — AB (ref 4.0–5.6)

## 2021-03-22 LAB — GLUCOSE, CAPILLARY: Glucose-Capillary: 76 mg/dL (ref 70–99)

## 2021-03-22 MED FILL — Glipizide Tab ER 24HR 5 MG: ORAL | 30 days supply | Qty: 30 | Fill #0 | Status: AC

## 2021-03-22 NOTE — Progress Notes (Signed)
CC: DM  HPI:  Michele Andrews is a 70 y.o. female with a past medical history stated below and presents today for DM. Please see problem based assessment and plan for additional details.  Past Medical History:  Diagnosis Date  . Carpal tunnel syndrome   . Diabetes mellitus    Type II- diagnosed 30 yrs. ago  . Family history of adverse reaction to anesthesia    20 YRS. AGO, HER 20 YR. OLD NEPHEW, ENDED UP ON VENTILATOR- PT. THINKS ITS BECAUSE HE ASPIRATED   . Hypercalcemia   . Hyperlipidemia   . Hypertension   . Osteopenia   . Rotator cuff syndrome of right shoulder     Current Outpatient Medications on File Prior to Visit  Medication Sig Dispense Refill  . Accu-Chek FastClix Lancets MISC USE TO CHECK BLOOD SUGAR 2 TIMES A DAY 102 each 5  . ACCU-CHEK GUIDE test strip USE 3 TO 4 TIMES DAILY TO CHECK BLOOD SUGAR 360 each 1  . acetaminophen (TYLENOL) 650 MG CR tablet Take 650 mg by mouth every 8 (eight) hours as needed for pain.    Marland Kitchen atorvastatin (LIPITOR) 40 MG tablet Take 1 tablet (40 mg total) by mouth daily. 90 tablet 3  . Blood Glucose Monitoring Suppl (FREESTYLE FREEDOM LITE) w/Device KIT 1 each by Does not apply route 3 (three) times daily before meals. 1 kit 0  . Blood Glucose Monitoring Suppl (TRUETRACK BLOOD GLUCOSE) W/DEVICE KIT Use to check blood sugar 2 times a day     . Cholecalciferol (VITAMIN D3) 2000 units TABS Take 2,000 Units by mouth daily.    . diclofenac Sodium (VOLTAREN) 1 % GEL APPLY 2 GRAMS TOPICALLY 4 TIMES DAILY. 300 g 0  . Dulaglutide 1.5 MG/0.5ML SOPN INJECT 1.5 MG INTO THE SKIN ONCE A WEEK. 2 mL 12  . fluticasone (FLONASE) 50 MCG/ACT nasal spray Place into the nose.    . furosemide (LASIX) 40 MG tablet TAKE 1 TABLET (40 MG TOTAL) BY MOUTH DAILY. 90 tablet 1  . glipiZIDE (GLUCOTROL XL) 5 MG 24 hr tablet TAKE 1 TABLET (5 MG TOTAL) BY MOUTH DAILY WITH BREAKFAST. 30 tablet 5  . glucose blood (FREESTYLE LITE) test strip Use as instructed 100 each 12  .  hydroxypropyl methylcellulose / hypromellose (ISOPTO TEARS / GONIOVISC) 2.5 % ophthalmic solution Place 1-2 drops into both eyes 3 (three) times daily as needed for dry eyes.    Marland Kitchen ibuprofen (ADVIL) 800 MG tablet TAKE 1 TABLET BY MOUTH EVERY 8 HOURS AS NEEDED FOR PAIN 30 tablet 1  . insulin glargine (LANTUS) 100 UNIT/ML Solostar Pen INJECT 22 UNITS INTO THE SKIN AT BEDTIME. 18 mL 3  . insulin glargine (SEMGLEE) 100 UNIT/ML injection Inject 0.22 mLs (22 Units total) into the skin at bedtime. 10 mL 11  . Insulin Glargine-yfgn 100 UNIT/ML SOPN INJECT 22 UNITS INTO THE SKIN AT BEDTIME. 18 mL 2  . Insulin Pen Needle 31G X 5 MM MISC USE AS DIRECTED 3 TIMES DAILY 100 each 5  . Lancets (FREESTYLE) lancets Use 3 times daily to check blood sugar 300 each 1  . lisinopril (ZESTRIL) 10 MG tablet TAKE 1 TABLET (10 MG TOTAL) BY MOUTH DAILY. 90 tablet 1  . metFORMIN (GLUCOPHAGE) 1000 MG tablet TAKE 1 TABLET (1,000 MG TOTAL) BY MOUTH 2 TIMES DAILY WITH A MEAL. 180 tablet 3  . methocarbamol (ROBAXIN) 500 MG tablet methocarbamol 500 mg tablet    . metoprolol succinate (TOPROL-XL) 25 MG 24 hr  tablet TAKE 1 TABLET BY MOUTH DAILY BEFORE BREAKFAST 90 tablet 1  . Zoster Vaccine Adjuvanted (SHINGRIX) injection TO BE ADMINISTERED BY PHARMACIST 1 each 1  . [DISCONTINUED] sitaGLIPtin (JANUVIA) 100 MG tablet TAKE 1 TABLET (100 MG TOTAL) BY MOUTH DAILY. 90 tablet 1   No current facility-administered medications on file prior to visit.    Family History  Problem Relation Age of Onset  . Hypertension Mother   . Cancer Mother   . Breast cancer Mother 62  . Breast cancer Maternal Aunt 59  . Breast cancer Cousin 74    Social History   Socioeconomic History  . Marital status: Married    Spouse name: Not on file  . Number of children: Not on file  . Years of education: 56  . Highest education level: Not on file  Occupational History  . Occupation: Research scientist (life sciences): [employer]  Tobacco Use  . Smoking status:  Never Smoker  . Smokeless tobacco: Never Used  Substance and Sexual Activity  . Alcohol use: No  . Drug use: No  . Sexual activity: Not on file  Other Topics Concern  . Not on file  Social History Narrative  . Not on file   Social Determinants of Health   Financial Resource Strain: Not on file  Food Insecurity: Not on file  Transportation Needs: Not on file  Physical Activity: Not on file  Stress: Not on file  Social Connections: Not on file  Intimate Partner Violence: Not on file    Review of Systems: ROS negative except for what is noted on the assessment and plan.  Vitals:   03/22/21 1511 03/22/21 1514  BP: (!) 142/93 133/85  Pulse: 100 98  Temp: 98.3 F (36.8 C)   TempSrc: Oral   SpO2: 100%   Weight: 175 lb 3.2 oz (79.5 kg)   Height: _0  (1.651 m)     Physical Exam: Gen: A&O x3 and in no apparent distress, well appearing and nourished. HEENT: Head - normocephalic, atraumatic. Eye -  visual acuity grossly intact, conjunctiva clear, sclera non-icteric, EOM intact. Mouth - No obvious caries or periodontal disease. Neck: no obvious masses or nodules, AROM intact. CV: RRR, no murmurs, rubs, or gallops. S1/S2 presents  Resp: Clear to ascultation bilaterally  Abd: BS (+) x4, soft, non-tender, without obvious hepatosplenomegaly or masses MSK: Grossly normal AROM and strength x4 extremities. Skin: good skin turgor, no rashes, unusual bruising, or prominent lesions.  Neuro: No focal deficits, grossly normal sensation and coordination.  Psych: Oriented x3 and responding appropriately. Intact recent and remote memory, normal mood, judgement, affect , and insight.    Assessment & Plan:   See Encounters Tab for problem based charting.  Patient discussed with Dr. Bertell Maria, D.O. [facility], PGY-2 Pager: [phone], Phone: [phone] Date 03/24/2021 Time 5:41 AM

## 2021-03-22 NOTE — Patient Instructions (Signed)
Thank you, Michele Andrews for allowing [place] to provide your care today. Today we discussed blood pressure and diabetes.    I have ordered the following labs for you:   Lab Orders     Glucose, capillary     BMP8+Anion Gap     POC Hbg A1C   Tests ordered today:  Continuous glucose monitoring.  Referrals ordered today:   Referral Orders  No referral(s) requested today     Medication Changes:   There are no discontinued medications.   No orders of the defined types were placed in this encounter.   Follow up: 2 weeks   Remember:  Should you have any questions or concerns please call the internal medicine clinic at [phone].     Marianna Payment, D.O. [place]

## 2021-03-22 NOTE — Patient Instructions (Signed)
Lab Results  Component Value Date   HGBA1C 8.3 (A) 03/22/2021   HGBA1C 8.9 (A) 12/22/2020   HGBA1C 8.2 (A) 09/08/2020   HGBA1C 8.6 (A) 04/28/2020   HGBA1C 7.7 (A) 10/15/2019    Please record the time, amount and what food drinks and activities you have while wearing the continuous glucose monitor (CGM).  Bring the folder with you to follow up appointments. If your monitor falls off, please place it in the bag provided in your folder and bring it back with you to your next appointment.   Do not have a CT or an MRI while wearing the CGM.   1 week visit has been set up with me and a doctor for the first of two CGM downloads.   You will also return in 2 weeks to have your second download and the CGM removed.  Debera Lat, RD 03/22/2021 3:37 PM

## 2021-03-22 NOTE — Progress Notes (Signed)
Diabetes Self-Management Education  Visit Type:    Appt. Start Time: 1530 Appt. End Time: 52  [name], identified by name and date of birth, is a 70 y.o. female with a diagnosis of Diabetes:  .Type 2  ASSESSMENT  Reports no nausea or negative side affects from Trulicity 1.5mg /week. she is disappointed her weight is not decrease and feels she needs to reduce her salt intake. This was discussed today.   MEDICATIONS: Diabetes medicines: 22 units of semglee daily,  5 mg glipizide,  Metformin 1 g bid; 1.5mg  Trulcity on Sundays. She is now in her 2nd week of Trulicity 1.5 mg.   BLOOD SUGAR: she forgot her meter today and reports her blood sugar have been 97-160s 101, 120 and today 76. Her a1c has declined, but not as much as expected. She is checking mostly fasting. Lab Results  Component Value Date   HGBA1C 8.3 (A) 03/22/2021   HGBA1C 8.9 (A) 12/22/2020   HGBA1C 8.2 (A) 09/08/2020   HGBA1C 8.6 (A) 04/28/2020   HGBA1C 7.7 (A) 10/15/2019     DIETARY INTAKE: she reports drinking more water (trying to reduce her diet soda intake) and eating dinner earlier at night   Usual physical activity: her work can be active and she is planning to take 30 minute walks   Documentation for [employer] Pro Continuous glucose monitoring Freestyle Libre Pro CGM sensor placed today. Patient was educated about wearing sensor, keeping food, activity and medication log and when to call office. Patient was educated about how to care for the sensor and not to have an MRI, CT or Diathermy while wearing the sensor. Follow up was arranged with the patient for 1 week.   Lot #: [id number] Serial #: [id number] Expiration Date: 07/10/21  Individualized Plan for Diabetes Self-Management Training:   Learning Objective:  Patient will have a greater understanding of diabetes self-management. Patient education plan is to attend individual and/or group sessions per assessed needs and concerns.   Plan:    Patient Instructions   Lab Results  Component Value Date   HGBA1C 8.3 (A) 03/22/2021   HGBA1C 8.9 (A) 12/22/2020   HGBA1C 8.2 (A) 09/08/2020   HGBA1C 8.6 (A) 04/28/2020   HGBA1C 7.7 (A) 10/15/2019    Please record the time, amount and what food drinks and activities you have while wearing the continuous glucose monitor (CGM).  Bring the folder with you to follow up appointments. If your monitor falls off, please place it in the bag provided in your folder and bring it back with you to your next appointment.   Do not have a CT or an MRI while wearing the CGM.   1 week visit has been set up with me and a doctor for the first of two CGM downloads.   You will also return in 2 weeks to have your second download and the CGM removed.  [name], RD 03/22/2021 3:37 PM Expected Outcomes:    Education material provided: Diabetes Resources If problems or questions, patient to contact team via:  Phone Future DSME appointment:    Debera Lat, RD 03/22/2021 3:43 PM. .

## 2021-03-23 LAB — BMP8+ANION GAP
Anion Gap: 18 mmol/L (ref 10.0–18.0)
BUN/Creatinine Ratio: 15 (ref 12–28)
BUN: 11 mg/dL (ref 8–27)
CO2: 25 mmol/L (ref 20–29)
Calcium: 10.9 mg/dL — ABNORMAL HIGH (ref 8.7–10.3)
Chloride: 97 mmol/L (ref 96–106)
Creatinine, Ser: 0.74 mg/dL (ref 0.57–1.00)
Glucose: 108 mg/dL — ABNORMAL HIGH (ref 65–99)
Potassium: 4.3 mmol/L (ref 3.5–5.2)
Sodium: 140 mmol/L (ref 134–144)
eGFR: 87 mL/min/{1.73_m2} (ref >59)

## 2021-03-24 ENCOUNTER — Encounter: Payer: Self-pay | Admitting: Internal Medicine

## 2021-03-24 NOTE — Assessment & Plan Note (Signed)
Patient presents for reevaluation of her HTN. BP today is 133/85 on metoprolol 25 mg, lisinopril 10 mg, Lasix 40 mg.  Plan: - Continue current antihypertensive management  - BMP today

## 2021-03-24 NOTE — Assessment & Plan Note (Addendum)
Patient presents today for CGM placement. She is tolerating her medications well. She is on the following medications - Glipizide 5 mg, metformin 1000 mg twice daily, Semglee 22 units qHS. She was originally prescribed trulicity but it was previously too expensive. We will try to titrate her medications to achieve an A1c <8.  Plan: - Will get an A1c today - Place CGM - Follow up in 2 weeks for further evaluation and management of her DM medications.  - Will need to clarify if the patient is taking the trulicity at her next appointment, as this was previously too expensive.

## 2021-03-27 NOTE — Progress Notes (Signed)
Internal Medicine Clinic Attending  Case discussed with Dr. Coe  At the time of the visit.  We reviewed the resident's history and exam and pertinent patient test results.  I agree with the assessment, diagnosis, and plan of care documented in the resident's note.  

## 2021-03-29 ENCOUNTER — Ambulatory Visit: Payer: No Typology Code available for payment source | Admitting: Dietician

## 2021-03-29 ENCOUNTER — Encounter: Payer: Self-pay | Admitting: Student

## 2021-03-29 ENCOUNTER — Encounter: Payer: Self-pay | Admitting: Dietician

## 2021-03-29 ENCOUNTER — Ambulatory Visit (INDEPENDENT_AMBULATORY_CARE_PROVIDER_SITE_OTHER): Payer: No Typology Code available for payment source | Admitting: Student

## 2021-03-29 DIAGNOSIS — Z794 Long term (current) use of insulin: Secondary | ICD-10-CM

## 2021-03-29 DIAGNOSIS — E785 Hyperlipidemia, unspecified: Secondary | ICD-10-CM

## 2021-03-29 DIAGNOSIS — E119 Type 2 diabetes mellitus without complications: Secondary | ICD-10-CM | POA: Diagnosis not present

## 2021-03-29 DIAGNOSIS — I10 Essential (primary) hypertension: Secondary | ICD-10-CM

## 2021-03-29 NOTE — Progress Notes (Signed)
Diabetes Self-Management Education  Visit Type: Follow-up  Appt. Start Time: 1400 Appt. End Time: 7026  03/29/2021  [name], identified by name and date of birth, is a 70 y.o. female with a diagnosis of Diabetes:  .   ASSESSMENT  Weight 175 lb 8 oz (79.6 kg). Body mass index is 29.2 kg/m.   Diabetes Self-Management Education - 03/29/21 1700      Visit Information   Visit Type Follow-up      Health Coping   How would you rate your overall health? Good      Patient Education   Nutrition management  Reviewed blood glucose goals for pre and post meals and how to evaluate the patients' food intake on their blood glucose level.    Medications Reviewed patients medication for diabetes, action, purpose, timing of dose and side effects.    Monitoring Other (comment)   reveiwed smbg and cgm reports with Michele     Individualized Goals (developed by patient)   Nutrition Follow meal plan discussed      Patient Self-Evaluation of Goals - Patient rates self as meeting previously set goals (% of time)   Medications >75%      Outcomes   Expected Outcomes Demonstrated interest in learning. Expect positive outcomes    Future DMSE Other (comment)   1 week for final CGM download   Program Status Re-entered      Subsequent Visit   Since your last visit have you continued or begun to take your medications as prescribed? Yes    Since your last visit have you had your blood pressure checked? Yes    Is your most recent blood pressure lower, unchanged, or higher since your last visit? Unchanged    Since your last visit have you experienced any weight changes? No change    Since your last visit, are you checking your blood glucose at least once a day? Yes         Patients cgm and meter were uploaded today.  Michele blood sugars were very much improved and she has 154 minutes in hypoglycemic range and >85% in target.  This was discussed with patient and Dr. Coy Saunas  Individualized Plan for  Diabetes Self-Management Training:   Learning Objective:  Patient will have a greater understanding of diabetes self-management. Patient education plan is to attend individual and/or group sessions per assessed needs and concerns.   Plan:  Continue making small changes to Michele meal plan. Adjust medication per doctor to encourage weight loss and well controlled blood sugars.    she says Michele Andrews if great support of Michele diabetes self care.   Expected Outcomes:  Demonstrated interest in learning. Expect positive outcomes Education material provided: Diabetes Resources If problems or questions, patient to contact team via:  Phone Future DSME appointment: Other (comment) (1 week for final CGM download)1 Chrisney, RD 03/29/2021 5:16 PM.

## 2021-03-29 NOTE — Assessment & Plan Note (Addendum)
Patient here for diabetes (A1c of 8.3) follow-up after CGM placement during office visit last week. Patient states she has been cutting down on fried foods, carbs and sweets. She has not been able to exercise as much as she wants but plans to find time after work to exercise each day. She denies any polydipsia, polyuria, dizziness, headaches or blurry vision. Patient states she is very motivated to control her blood sugar and get her A1c down. Over the last week, CGM reading shows patient is in target range 93% of the time, above range 3%, and below range 4%. Her average blood sugar is 111.  She has had occasional lows overnight and in the evening but her overall blood sugar is very well controlled. Patient advised to check fasting sugars each morning and bring log to her next OV. Plan to continue current regimen for another week.   Plan: --Continue metformin 1000 mg twice daily --Continue glipizide 5 mg daily --Continue Trulicity 1.5 mg weekly --Continue Semglee 22 units daily at bedtime --1 week follow-up for CGM removal and reassessment.

## 2021-03-29 NOTE — Assessment & Plan Note (Signed)
Patient's BP today 141/72.  No need for any significant change of her regimen at the moment.  Continue to monitor closely.  Plan: --Continue on lisinopril 10 mg daily --Continue metoprolol 25 mg daily --Continue furosemide 40 mg daily --Recheck BP at next office visit

## 2021-03-29 NOTE — Patient Instructions (Addendum)
  Thank you, Ms.Rich Number for allowing [place] to provide your care today. Today we discussed, we discussed your diabetes.  You are doing a very good and we encourage you to continue making the lifestyle changes to help reduce your blood sugar.  Good Job on decreasing portions of food and making healthier choices!   Please make a follow up appointment with [name] in 1 week to download the CGM and remove it.  Please follow-up in 1 week.  Should you have any questions or concerns please call the internal medicine clinic at [phone].    Linwood Dibbles, MD, MPH [facility]   My Chart Access: [email or web address]   If you have not already done so, please get your COVID 19 vaccine  To schedule an appointment for a COVID vaccine choice any of the following: Go to [email or web address]   Go to [email or web address]                  Call [phone]                                     Call [phone] and select Option 2

## 2021-03-29 NOTE — Assessment & Plan Note (Signed)
Patient due for recheck of her lipid panel. --Check lipid panel at next office visit

## 2021-03-29 NOTE — Progress Notes (Signed)
   CC: Diabetes follow-up  HPI:  Michele Andrews is a 70 y.o. with PMH as below who presents to clinic for diabetes follow-up after starting CGM during last visit. Please see problem based charting for evaluation, assessment and plan.  Past Medical History:  Diagnosis Date  . Carpal tunnel syndrome   . Diabetes mellitus    Type II- diagnosed 30 yrs. ago  . Family history of adverse reaction to anesthesia    20 YRS. AGO, HER 20 YR. OLD NEPHEW, ENDED UP ON VENTILATOR- PT. THINKS ITS BECAUSE HE ASPIRATED   . Hypercalcemia   . Hyperlipidemia   . Hypertension   . Osteopenia   . Rotator cuff syndrome of right shoulder     Review of Systems:  Constitutional: Negative for fever, polydipsia or fatigue. Eyes: Negative for visual changes Respiratory: Negative for shortness of breath Abdomen: Negative for abdominal pain, constipation or diarrhea GU: Negative for polyuria Neuro: Negative for headache, dizziness or weakness  Physical Exam: General: Pleasant, well-appearing elderly woman. No acute distress. Cardiac: Tachycardic. Regular rhythm. No m/r/g. No LE edema Respiratory: Lungs CTAB. No wheezing or crackles. Abdominal: Soft, symmetric and non tender. Normal BS. Skin: Warm, dry and intact without rashes or lesions Extremities: Atraumatic. Full ROM. Pulse palpable. Neuro: A&O x 3. Moves all extremities Psych: Appropriate mood and affect.  Vitals:   03/29/21 1510  BP: (!) 141/72  Pulse: 100  Temp: 98.7 F (37.1 C)  TempSrc: Oral  SpO2: 99%  Weight: 175 lb 8 oz (79.6 kg)    Assessment & Plan:   See Encounters Tab for problem based charting.  Patient discussed with Dr. Lockie Pares, MD, MPH

## 2021-03-30 NOTE — Progress Notes (Signed)
Internal Medicine Clinic Attending  Case discussed with Dr. Amponsah  At the time of the visit.  We reviewed the resident's history and exam and pertinent patient test results.  I agree with the assessment, diagnosis, and plan of care documented in the resident's note.  

## 2021-04-04 ENCOUNTER — Other Ambulatory Visit (HOSPITAL_COMMUNITY): Payer: Self-pay

## 2021-04-04 ENCOUNTER — Other Ambulatory Visit: Payer: Self-pay | Admitting: Internal Medicine

## 2021-04-04 MED ORDER — ACCU-CHEK GUIDE VI STRP
ORAL_STRIP | 1 refills | Status: DC
  Filled 2021-04-04: qty 100, 50d supply, fill #0

## 2021-04-04 MED ORDER — FREESTYLE LANCETS MISC
1 refills | Status: DC
  Filled 2021-04-04: qty 100, 50d supply, fill #0

## 2021-04-04 MED ORDER — FREESTYLE LITE TEST VI STRP
ORAL_STRIP | 12 refills | Status: DC
  Filled 2021-04-04: qty 100, 50d supply, fill #0
  Filled 2021-06-02: qty 100, 50d supply, fill #1
  Filled 2021-10-11: qty 100, 50d supply, fill #2
  Filled 2022-01-25: qty 100, 50d supply, fill #3

## 2021-04-06 ENCOUNTER — Encounter: Payer: Self-pay | Admitting: Dietician

## 2021-04-06 ENCOUNTER — Ambulatory Visit (INDEPENDENT_AMBULATORY_CARE_PROVIDER_SITE_OTHER): Payer: No Typology Code available for payment source | Admitting: Dietician

## 2021-04-06 DIAGNOSIS — E119 Type 2 diabetes mellitus without complications: Secondary | ICD-10-CM | POA: Diagnosis not present

## 2021-04-06 DIAGNOSIS — Z794 Long term (current) use of insulin: Secondary | ICD-10-CM | POA: Diagnosis not present

## 2021-04-06 NOTE — Progress Notes (Signed)
Diabetes Self-Management Education  Visit Type:    Appt. Start Time: 1435 Appt. End Time: 0998  04/07/2021  [name], identified by name and date of birth, is a 70 y.o. female with a diagnosis of Diabetes:  Type 2 diabetes  ASSESSMENT  Patient's goals are to decrease her weight and blood sugars. She has decreased her blood sugars but is challenged by hypoglycemia and weight loss. Suggest increasing Trulicity to 3 mg weekly and stopping extended release glipizide and reducing semglee by 20% (~ 16-18 unit daily)to accommodate weight loss.  Wt Readings from Last 10 Encounters:  04/06/21 175 lb 8 oz (79.6 kg)  03/29/21 175 lb 8 oz (79.6 kg)  03/29/21 175 lb 8 oz (79.6 kg)  03/22/21 175 lb 3.2 oz (79.5 kg)  02/10/21 177 lb 6.4 oz (80.5 kg)  12/22/20 177 lb 9.6 oz (80.6 kg)  10/25/20 173 lb 9.6 oz (78.7 kg)  09/08/20 172 lb 8 oz (78.2 kg)  08/11/20 178 lb 4.8 oz (80.9 kg)  06/29/20 176 lb 1.6 oz (79.9 kg)   Lab Results  Component Value Date   HGBA1C 8.3 (A) 03/22/2021   HGBA1C 8.9 (A) 12/22/2020   HGBA1C 8.2 (A) 09/08/2020   HGBA1C 8.6 (A) 04/28/2020   HGBA1C 7.7 (A) 10/15/2019     CGM Results from download:   Average glucose:   124 mg/dL for 14 days  Glucose management indicator:   6.3 %  Time in range (70-180 mg/dL):   91 %   (Goal >70%)  Time High (181-250 mg/dL):   7 %   (Goal < 25%)  Time Very High (>250 mg/dL):    0 %   (Goal < 5%)  Time Low (54-69 mg/dL):   2 %   (Goal <4%)  Time Very Low (<54 mg/dL):   0 %   (Goal <1%)  Coefficient of variation:   29 %   (Goal <36%)      Diabetes Self-Management Education - 04/07/21 0900      Patient Self-Evaluation of Goals - Patient rates self as meeting previously set goals (% of time)   Nutrition >75%    Physical Activity 25 - 50%    Medications >75%      Post-Education Assessment   Patient understands incorporating nutritional management into lifestyle. Demonstrates understanding / competency    Patient  understands using medications safely. Demonstrates understanding / competency    Patient understands monitoring blood glucose, interpreting and using results Demonstrates understanding / competency      Outcomes   Expected Outcomes Demonstrated interest in learning. Expect positive outcomes    Future DMSE 3-4 months    Program Status Completed         Patients cgm sensor was removed and uploaded today.  Her blood sugars continue to be very much improved and she has 154 minutes in hypoglycemic range and >85% in target. She was not symptomatic but this is a concerning trend in a 70 year old.  This was discussed with patient.   Individualized Plan for Diabetes Self-Management Training:   Learning Objective:  Patient will have a greater understanding of diabetes self-management. Patient education plan is to attend individual and/or group sessions per assessed needs and concerns.   Plan:  Continue making small changes to her meal plan. Adjust medication per doctor to encourage weight loss and well controlled blood sugars.    she says Her spouse if great support of her diabetes self care.   Expected Outcomes:  Demonstrated  interest in learning. Expect positive outcomes Education material provided: Diabetes Resources If problems or questions, patient to contact team via:  Phone Future DSME appointment: 3-4 months- repeat cgm in July Michele Andrews [place], RD 04/07/2021 9:18 AM.

## 2021-04-07 ENCOUNTER — Telehealth: Payer: Self-pay | Admitting: Dietician

## 2021-04-07 ENCOUNTER — Other Ambulatory Visit (HOSPITAL_COMMUNITY): Payer: Self-pay

## 2021-04-07 ENCOUNTER — Other Ambulatory Visit: Payer: Self-pay | Admitting: Internal Medicine

## 2021-04-07 DIAGNOSIS — E119 Type 2 diabetes mellitus without complications: Secondary | ICD-10-CM

## 2021-04-07 DIAGNOSIS — Z794 Long term (current) use of insulin: Secondary | ICD-10-CM

## 2021-04-07 MED ORDER — DULAGLUTIDE 3 MG/0.5ML ~~LOC~~ SOAJ
3.0000 mg | SUBCUTANEOUS | 2 refills | Status: DC
  Filled 2021-04-07: qty 6, 84d supply, fill #0

## 2021-04-07 MED ORDER — INSULIN GLARGINE 100 UNIT/ML ~~LOC~~ SOLN
18.0000 [IU] | Freq: Every day | SUBCUTANEOUS | 11 refills | Status: DC
  Filled 2021-04-07: qty 10, 28d supply, fill #0
  Filled 2021-06-02: qty 10, 55d supply, fill #0

## 2021-04-07 MED ORDER — DULAGLUTIDE 1.5 MG/0.5ML ~~LOC~~ SOAJ
3.0000 mg | SUBCUTANEOUS | 11 refills | Status: DC
  Filled 2021-04-07: qty 2, 28d supply, fill #0

## 2021-04-07 NOTE — Telephone Encounter (Signed)
Called patient with new ordrs for increased dose of Trulicity to 3mg /week, and when she does this to decrease her semglee insulin to 18 units /day and stop her glipizide and check her blood sugar more often. She verbalized understanding saying the pharmacy called her about the trulicity prescription being written incorrectly and that they could not get in touch with our office. I will ask or pharmacy team to address this tomorrow.

## 2021-04-07 NOTE — Patient Instructions (Addendum)
Good Job Arcenia!   Keep working on lifestyle changes- eating healthy and moving more.  Your weight will start falling eventually as we discussed.   Please make a follow with one of our doctors in July  Safaa Stingley [phone]

## 2021-04-07 NOTE — Addendum Note (Signed)
Addended by: Lawerance Cruel on: 04/07/2021 02:13 PM   Modules accepted: Orders

## 2021-04-07 NOTE — Progress Notes (Signed)
Spoke with [name] Plyler about this patient diabetes regimen. I agree that the patient would benefit form the following medication changes due to decreased weight and evidence of hypoglycemia.   - Increase Trulicity to 3 mg weekly - Stop glipizide  - Decreased Semglee to 18 units daily.    Lawerance Cruel, D.O.  Internal Medicine Resident, PGY-2 [facility]  Pager: [phone] 2:13 PM, 04/07/2021

## 2021-04-08 ENCOUNTER — Other Ambulatory Visit (HOSPITAL_COMMUNITY): Payer: Self-pay

## 2021-04-08 ENCOUNTER — Telehealth: Payer: Self-pay

## 2021-04-08 NOTE — Telephone Encounter (Signed)
Call from front office - pt stated she had received a call from the pharmacy that Trulicity was increased but rx written incorrectly.  I called [facility] pharmacy who stated this has been corrected and ready for pick up.  I called pt back; informed of the above.

## 2021-04-09 ENCOUNTER — Other Ambulatory Visit: Payer: Self-pay | Admitting: Internal Medicine

## 2021-04-09 DIAGNOSIS — E119 Type 2 diabetes mellitus without complications: Secondary | ICD-10-CM

## 2021-04-09 MED ORDER — TRULICITY 3 MG/0.5ML ~~LOC~~ SOAJ
3.0000 mg | SUBCUTANEOUS | 2 refills | Status: DC
  Filled 2021-04-09 – 2021-04-18 (×2): qty 2, 28d supply, fill #0
  Filled 2021-06-27: qty 6, 84d supply, fill #0

## 2021-04-09 NOTE — Telephone Encounter (Signed)
Hey there, I am sorry there was confusion regarding this medication. I previously submitted the 3 mg dose of this medication. It is strange that her insurance wont cover the generic as this is what she was previously on at 1.5 mg.   I'll resubmit it with the brand name to see if that helps.   Best,  [facility]

## 2021-04-11 ENCOUNTER — Other Ambulatory Visit (HOSPITAL_COMMUNITY): Payer: Self-pay

## 2021-04-18 ENCOUNTER — Other Ambulatory Visit (HOSPITAL_COMMUNITY): Payer: Self-pay

## 2021-04-22 ENCOUNTER — Other Ambulatory Visit: Payer: Self-pay | Admitting: Internal Medicine

## 2021-04-22 ENCOUNTER — Other Ambulatory Visit (HOSPITAL_COMMUNITY): Payer: Self-pay

## 2021-04-22 DIAGNOSIS — I10 Essential (primary) hypertension: Secondary | ICD-10-CM

## 2021-04-22 MED ORDER — LISINOPRIL 10 MG PO TABS
10.0000 mg | ORAL_TABLET | Freq: Every day | ORAL | 1 refills | Status: DC
Start: 2021-04-22 — End: 2021-10-21
  Filled 2021-04-22: qty 90, 90d supply, fill #0
  Filled 2021-07-19: qty 90, 90d supply, fill #1

## 2021-04-22 MED FILL — Metoprolol Succinate Tab ER 24HR 25 MG (Tartrate Equiv): ORAL | 90 days supply | Qty: 90 | Fill #0 | Status: AC

## 2021-05-10 ENCOUNTER — Other Ambulatory Visit (HOSPITAL_COMMUNITY): Payer: Self-pay

## 2021-05-10 ENCOUNTER — Telehealth: Payer: Self-pay | Admitting: Dietician

## 2021-05-10 MED ORDER — CARESTART COVID-19 HOME TEST VI KIT
PACK | 0 refills | Status: DC
  Filled 2021-05-10: qty 4, 4d supply, fill #0

## 2021-05-10 NOTE — Telephone Encounter (Signed)
Called to provide diabetes support to patient after diabetes medicines were adjusted. She states her highest blood sugar was 169 one time when she forget to take her metformin, 123-124 mostly otherwise. "I am doing great!" she agreed hr next appointment is due mid July.

## 2021-05-11 ENCOUNTER — Other Ambulatory Visit: Payer: Self-pay | Admitting: Internal Medicine

## 2021-05-11 DIAGNOSIS — Z1231 Encounter for screening mammogram for malignant neoplasm of breast: Secondary | ICD-10-CM

## 2021-05-14 ENCOUNTER — Ambulatory Visit
Admission: RE | Admit: 2021-05-14 | Discharge: 2021-05-14 | Disposition: A | Discharge status: Home / Self Care | Payer: No Typology Code available for payment source | Source: Ambulatory Visit | Attending: Family Medicine | Admitting: Family Medicine

## 2021-05-14 ENCOUNTER — Other Ambulatory Visit: Payer: Self-pay

## 2021-05-14 DIAGNOSIS — Z1231 Encounter for screening mammogram for malignant neoplasm of breast: Secondary | ICD-10-CM

## 2021-05-17 ENCOUNTER — Other Ambulatory Visit (HOSPITAL_COMMUNITY): Payer: Self-pay

## 2021-05-17 MED FILL — Metformin HCl Tab 1000 MG: ORAL | 90 days supply | Qty: 180 | Fill #0 | Status: AC

## 2021-05-17 MED FILL — Ibuprofen Tab 800 MG: ORAL | 10 days supply | Qty: 30 | Fill #0 | Status: CN

## 2021-05-25 ENCOUNTER — Other Ambulatory Visit: Payer: Self-pay | Admitting: Internal Medicine

## 2021-05-25 ENCOUNTER — Other Ambulatory Visit (HOSPITAL_COMMUNITY): Payer: Self-pay

## 2021-05-25 DIAGNOSIS — I10 Essential (primary) hypertension: Secondary | ICD-10-CM

## 2021-05-26 ENCOUNTER — Other Ambulatory Visit (HOSPITAL_COMMUNITY): Payer: Self-pay

## 2021-05-26 MED ORDER — FUROSEMIDE 40 MG PO TABS
40.0000 mg | ORAL_TABLET | Freq: Every day | ORAL | 1 refills | Status: DC
  Filled 2021-05-26: qty 90, 90d supply, fill #0
  Filled 2021-08-18: qty 90, 90d supply, fill #1

## 2021-05-27 ENCOUNTER — Encounter: Payer: Self-pay | Admitting: *Deleted

## 2021-05-27 ENCOUNTER — Other Ambulatory Visit (HOSPITAL_COMMUNITY): Payer: Self-pay

## 2021-05-27 MED FILL — Ibuprofen Tab 800 MG: ORAL | 10 days supply | Qty: 30 | Fill #0 | Status: AC

## 2021-06-02 ENCOUNTER — Other Ambulatory Visit (HOSPITAL_COMMUNITY): Payer: Self-pay

## 2021-06-03 ENCOUNTER — Other Ambulatory Visit (HOSPITAL_COMMUNITY): Payer: Self-pay

## 2021-06-08 ENCOUNTER — Other Ambulatory Visit (HOSPITAL_COMMUNITY): Payer: Self-pay

## 2021-06-09 ENCOUNTER — Telehealth: Payer: Self-pay | Admitting: Internal Medicine

## 2021-06-09 ENCOUNTER — Other Ambulatory Visit (HOSPITAL_COMMUNITY): Payer: Self-pay

## 2021-06-09 ENCOUNTER — Other Ambulatory Visit: Payer: Self-pay

## 2021-06-09 DIAGNOSIS — Z794 Long term (current) use of insulin: Secondary | ICD-10-CM

## 2021-06-09 DIAGNOSIS — E119 Type 2 diabetes mellitus without complications: Secondary | ICD-10-CM

## 2021-06-09 MED ORDER — INSULIN GLARGINE-YFGN 100 UNIT/ML ~~LOC~~ SOPN
22.0000 [IU] | PEN_INJECTOR | Freq: Every day | SUBCUTANEOUS | 4 refills | Status: DC
  Filled 2021-06-09: qty 15, 68d supply, fill #0

## 2021-06-09 NOTE — Telephone Encounter (Addendum)
Per pharmacy, insulin glargine (semglee) rx needs to be switched to the pens (that's what insurance will cover) Current rx on file is for the vial

## 2021-06-09 NOTE — Telephone Encounter (Signed)
Rec'd phone call from Dr. Aurelio Jew former patient requesting a Referral to Dr. Zenia Resides office for her Yearly Diabetic Eye Exam.  The patient ins requires a Referral Please advis if you can place a new Referral.

## 2021-06-09 NOTE — Telephone Encounter (Signed)
Done, new prescription sent in.

## 2021-06-09 NOTE — Telephone Encounter (Signed)
Referral placed.

## 2021-06-09 NOTE — Telephone Encounter (Signed)
Pt is requesting her insulin glargine (SEMGLEE) 100 UNIT/ML injection sent to  [facility] Phone:  [phone]  Fax:  [phone]

## 2021-06-17 ENCOUNTER — Telehealth: Payer: Self-pay

## 2021-06-17 ENCOUNTER — Other Ambulatory Visit (HOSPITAL_COMMUNITY): Payer: Self-pay

## 2021-06-17 MED ORDER — CYCLOBENZAPRINE HCL 5 MG PO TABS
5.0000 mg | ORAL_TABLET | Freq: Three times a day (TID) | ORAL | 0 refills | Status: DC | PRN
  Filled 2021-06-17: qty 21, 7d supply, fill #0

## 2021-06-17 NOTE — Telephone Encounter (Signed)
Short course of flexeril sent to her pharmacy. Will need appointment for further refills.

## 2021-06-17 NOTE — Telephone Encounter (Signed)
Requesting to speak with a nurse about getting muscle relaxer, pt is having neck spasm. Pt would like a call back.

## 2021-06-17 NOTE — Telephone Encounter (Signed)
RTC, patient states she is having neck spasms and she has had these in the past.  States she usually gets a RX for flexeril sent in for them.  States her neck spasms started this morning, she has taken Ibuprofen without relief.  RN informed patient that RN will send request to PCP to advise on refill.  However, it may be necessary for her to come into clinic for evaluation of her neck.  She verbalized understanding and an appt was made for Wednesday, 06/22/21 @ [place] w/ Dr. Collene Gobble. SChaplin, RN,BSN

## 2021-06-22 ENCOUNTER — Encounter: Payer: Self-pay | Admitting: Student

## 2021-06-22 ENCOUNTER — Ambulatory Visit (INDEPENDENT_AMBULATORY_CARE_PROVIDER_SITE_OTHER): Payer: No Typology Code available for payment source | Admitting: Student

## 2021-06-22 ENCOUNTER — Other Ambulatory Visit: Payer: Self-pay

## 2021-06-22 VITALS — BP 148/83 | HR 93 | Temp 98.3°F | Ht 63.5 in | Wt 167.5 lb

## 2021-06-22 DIAGNOSIS — Z794 Long term (current) use of insulin: Secondary | ICD-10-CM | POA: Diagnosis not present

## 2021-06-22 DIAGNOSIS — M542 Cervicalgia: Secondary | ICD-10-CM

## 2021-06-22 DIAGNOSIS — E119 Type 2 diabetes mellitus without complications: Secondary | ICD-10-CM

## 2021-06-22 NOTE — Progress Notes (Signed)
   CC: neck pain  HPI:  [name] is a 70 y.o. with medical history as below presenting to [facility] for neck pain  Please see problem-based list for further details, assessments, and plans.  Past Medical History:  Diagnosis Date   Carpal tunnel syndrome    Diabetes mellitus    Type II- diagnosed 30 yrs. ago   Family history of adverse reaction to anesthesia    20 YRS. AGO, HER 19 YR. OLD NEPHEW, ENDED UP ON VENTILATOR- PT. THINKS ITS BECAUSE HE ASPIRATED    Hypercalcemia    Hyperlipidemia    Hypertension    Osteopenia    Rotator cuff syndrome of right shoulder    Review of Systems:  As per HPI  Physical Exam:  Vitals:   06/22/21 1459  BP: (!) 148/83  Pulse: 93  Temp: 98.3 F (36.8 C)  TempSrc: Oral  SpO2: 100%  Weight: 167 lb 8 oz (76 kg)  Height: 5' 3.5" (1.613 m)   General: Well-developed, well-nourished. No acute distress HENT: No cervical lymphadenopathy present. No obvious cervical musculoskeletal abnormalities.  CV: Regular rate, rhythm. No murmurs, rubs, gallops appreciated Pulm: Normal work of breathing on room air. Clear to ausculation bilaterally. MSK: Normal bulk, tone. Cervical active and passive range of motion normal. No cervical or thoracic spinal or paraspinal tenderness. Skin: Warm, dry. No rashes or lesions. Neuro: Awake, alert, answering questions appropriately. Psych: Normal mood, affect, speech.  Assessment & Plan:   See Encounters Tab for problem based charting.  Patient discussed with Dr. Daryll Drown

## 2021-06-22 NOTE — Patient Instructions (Signed)
[name], it was a pleasure seeing you today!  Today we discussed: - Neck pain: I am so glad this is improving! Continue using lidocaine patches, ibuprofen, and acetaminophen as needed. In addition, make sure you are exercising the neck as much as possible.   Referrals ordered today:    Referral Orders  Ambulatory referral to Ophthalmology      Follow-up:  if symptoms worsen or persist    Please make sure to arrive 15 minutes prior to your next appointment. If you arrive late, you may be asked to reschedule.   We look forward to seeing you next time. Please call our clinic at [phone] if you have any questions or concerns. The best time to call is Monday-Friday from 9am-4pm, but there is someone available 24/7. If after hours or the weekend, call the main hospital number and ask for the Internal Medicine Resident On-Call. If you need medication refills, please notify your pharmacy one week in advance and they will send [place] a request.  Thank you for letting us take part in your care. Wishing you the best!  Thank you, [name], MD

## 2021-06-23 ENCOUNTER — Telehealth: Payer: Self-pay | Admitting: Dietician

## 2021-06-23 NOTE — Telephone Encounter (Signed)
Diabetes follow up scheduled.

## 2021-06-24 NOTE — Assessment & Plan Note (Signed)
Michele Andrews is presenting to [facility] today to discuss neck pain that began last Friday. She describes a gradual onset during the day of pain that was located on the right side of her neck. Denies any injury or trauma prior to this. Mentions at the time it was so painful she could barely move her head. At that time a short course of muscle relaxer was prescribed. Since this time, Michele Andrews reports that she has been alternating acetaminophen and ibuprofen as well as ice/warm compresses over the area. Currently states that the pain is much improved since onset last Friday.   On exam, no obvious deformities and active and passive cervical range of motion is normal. I have instructed Michele Andrews to continue with conservative management, as this should continue to improve.  - Alternate acetaminophen/ibuprofen as needed - Ice/warm compress - Continue moving neck to ensure neck does not become stiff - Return to clinic if symptoms worsen or persist past 4 weeks

## 2021-06-27 ENCOUNTER — Ambulatory Visit (INDEPENDENT_AMBULATORY_CARE_PROVIDER_SITE_OTHER): Payer: No Typology Code available for payment source | Admitting: Dietician

## 2021-06-27 ENCOUNTER — Other Ambulatory Visit (HOSPITAL_COMMUNITY): Payer: Self-pay

## 2021-06-27 ENCOUNTER — Encounter: Payer: Self-pay | Admitting: Dietician

## 2021-06-27 VITALS — Wt 166.3 lb

## 2021-06-27 DIAGNOSIS — Z794 Long term (current) use of insulin: Secondary | ICD-10-CM | POA: Diagnosis not present

## 2021-06-27 DIAGNOSIS — E119 Type 2 diabetes mellitus without complications: Secondary | ICD-10-CM

## 2021-06-27 LAB — GLUCOSE, CAPILLARY: Glucose-Capillary: 107 mg/dL — ABNORMAL HIGH (ref 70–99)

## 2021-06-27 LAB — POCT GLYCOSYLATED HEMOGLOBIN (HGB A1C): Hemoglobin A1C: 7.3 % — AB (ref 4.0–5.6)

## 2021-06-27 NOTE — Progress Notes (Signed)
Diabetes Self-Management Education  Visit Type:    Appt. Start Time: 1455 Appt. End Time: 1520  06/27/2021  [name], identified by name and date of birth, is a 70 y.o. female with a diagnosis of Diabetes:  Type 2  ASSESSMENT  Michele Andrews is here for her 3-4 month DSMES follow up. She also request and was approved to have an A1C done and a CGM placed by Dr. Philipp Ovens.   Her weight and a1c are both significantly decreased: weight 175 to 166# and a1c 8.9 to 7.3%  Weight 166 lb 4.8 oz (75.4 kg). Body mass index is 29 kg/m. Wt Readings from Last 10 Encounters:  06/27/21 166 lb 4.8 oz (75.4 kg)  06/22/21 167 lb 8 oz (76 kg)  04/06/21 175 lb 8 oz (79.6 kg)  03/29/21 175 lb 8 oz (79.6 kg)  03/29/21 175 lb 8 oz (79.6 kg)  03/22/21 175 lb 3.2 oz (79.5 kg)  02/10/21 177 lb 6.4 oz (80.5 kg)  12/22/20 177 lb 9.6 oz (80.6 kg)  10/25/20 173 lb 9.6 oz (78.7 kg)  09/08/20 172 lb 8 oz (78.2 kg)   Lab Results  Component Value Date   HGBA1C 7.3 (A) 06/27/2021   HGBA1C 8.3 (A) 03/22/2021   HGBA1C 8.9 (A) 12/22/2020   HGBA1C 8.2 (A) 09/08/2020   HGBA1C 8.6 (A) 04/28/2020     Documentation for Freestyle Libre Pro Continuous glucose monitoring Freestyle Libre Pro CGM sensor placed today. Patient was educated about wearing sensor, keeping food, activity and medication log and when to call office. Patient was educated about how to care for the sensor and not to have an MRI, CT or Diathermy while wearing the sensor. Follow up was arranged with the patient for 1-2 weeks.   Lot #[id number] Serial #[id number] Expiration Date: 12/10/21  Individualized Plan for Diabetes Self-Management Training:   Learning Objective:  Patient will have a greater understanding of diabetes self-management. Patient education plan is to attend individual and/or group sessions per assessed needs and concerns.   Plan:   Patient Instructions  Schedule with Dr. Philipp Ovens and I on the same day in about the next 2  weeks.  Please record the time, amount and what food drinks and activities you have while wearing the continuous glucose monitor (CGM).  Bring the folder with you to follow up appointments. If your monitor falls off, please place it in the bag provided in your folder and bring it back with you to your next appointment.   Do not have a CT or an MRI while wearing the CGM.   [name] [phone]   Expected Outcomes:    improved understanding ofher diabetes self management  Education material provided: Diabetes Resources  If problems or questions, patient to contact team via:  Phone  Future DSME appointment: -  2 weeks Debera Lat, RD 06/27/2021 3:51 PM.

## 2021-06-27 NOTE — Patient Instructions (Addendum)
Schedule with Dr. Philipp Ovens and I on the same day in about the next 2 weeks.  Please record the time, amount and what food drinks and activities you have while wearing the continuous glucose monitor (CGM).  Bring the folder with you to follow up appointments. If your monitor falls off, please place it in the bag provided in your folder and bring it back with you to your next appointment.   Do not have a CT or an MRI while wearing the CGM.   [name] [phone]

## 2021-06-28 ENCOUNTER — Other Ambulatory Visit (HOSPITAL_COMMUNITY): Payer: Self-pay

## 2021-06-30 ENCOUNTER — Encounter (INDEPENDENT_AMBULATORY_CARE_PROVIDER_SITE_OTHER): Payer: No Typology Code available for payment source | Admitting: Ophthalmology

## 2021-07-04 NOTE — Progress Notes (Signed)
Internal Medicine Clinic Attending ? ?Case discussed with Dr. Braswell  At the time of the visit.  We reviewed the resident?s history and exam and pertinent patient test results.  I agree with the assessment, diagnosis, and plan of care documented in the resident?s note.  ?

## 2021-07-11 ENCOUNTER — Ambulatory Visit (INDEPENDENT_AMBULATORY_CARE_PROVIDER_SITE_OTHER): Payer: No Typology Code available for payment source | Admitting: Internal Medicine

## 2021-07-11 ENCOUNTER — Ambulatory Visit (INDEPENDENT_AMBULATORY_CARE_PROVIDER_SITE_OTHER): Payer: No Typology Code available for payment source | Admitting: Dietician

## 2021-07-11 ENCOUNTER — Other Ambulatory Visit: Payer: Self-pay

## 2021-07-11 ENCOUNTER — Other Ambulatory Visit (HOSPITAL_COMMUNITY): Payer: Self-pay

## 2021-07-11 ENCOUNTER — Encounter: Payer: Self-pay | Admitting: Internal Medicine

## 2021-07-11 VITALS — BP 115/80 | HR 98 | Temp 98.4°F | Ht 63.5 in | Wt 165.6 lb

## 2021-07-11 DIAGNOSIS — Z713 Dietary counseling and surveillance: Secondary | ICD-10-CM | POA: Diagnosis not present

## 2021-07-11 DIAGNOSIS — I10 Essential (primary) hypertension: Secondary | ICD-10-CM

## 2021-07-11 DIAGNOSIS — E210 Primary hyperparathyroidism: Secondary | ICD-10-CM

## 2021-07-11 DIAGNOSIS — E119 Type 2 diabetes mellitus without complications: Secondary | ICD-10-CM | POA: Diagnosis not present

## 2021-07-11 DIAGNOSIS — Z0000 Encounter for general adult medical examination without abnormal findings: Secondary | ICD-10-CM | POA: Diagnosis not present

## 2021-07-11 DIAGNOSIS — E785 Hyperlipidemia, unspecified: Secondary | ICD-10-CM

## 2021-07-11 DIAGNOSIS — Z794 Long term (current) use of insulin: Secondary | ICD-10-CM

## 2021-07-11 MED ORDER — INSULIN GLARGINE-YFGN 100 UNIT/ML ~~LOC~~ SOPN
10.0000 [IU] | PEN_INJECTOR | Freq: Every day | SUBCUTANEOUS | 2 refills | Status: DC
  Filled 2021-07-11: qty 9, 90d supply, fill #0
  Filled 2021-08-25: qty 3, 30d supply, fill #0
  Filled 2021-09-27: qty 3, 30d supply, fill #1
  Filled 2021-10-31: qty 3, 30d supply, fill #2

## 2021-07-11 MED ORDER — TRULICITY 4.5 MG/0.5ML ~~LOC~~ SOAJ
4.5000 mg | SUBCUTANEOUS | 2 refills | Status: DC
  Filled 2021-07-11: qty 2, 28d supply, fill #0

## 2021-07-11 NOTE — Patient Instructions (Signed)
I think you are doing wonderful at cutting back on carbs and taking your medicines.   Ideas to help boost protein and cut carbs-   Add nuts to meal and snacks- nuts in a salad or in yogurt or in cereal  Fish- una and salmon packs  Greek yogurt instead of regular yogurt or ice cream.  We can follow up  in about 1 month.   [name] [phone]

## 2021-07-11 NOTE — Progress Notes (Signed)
Medical Nutrition Therapy:  Appt start time: [id number] end time:  [id number]. Total time: 30 Visit # 3  Assessment:   Michele Andrews is here for her MNT follow up and 2 weeks professional CGM download.   She has had her Trulicity titrated to 3 Mg weekly and is doing well.  The plan she and I were working on was to help her with lowering her blood sugars and weight at the same time. She has been able to stop her glipizide, lower her blood sugars and her weight is just starting to come down, but is still taking insulin.  Maybe I should have waited on the CGM for her to see you so that she could try less insulin. the preliminary 9 day download did not show lows that I had expected ( however 91% of blood sugars are in target with average of 134) with her weight loss.  Her CGM report showed in target 91% of the time, above 180 < 250 mg/dl 9% and a GMI of 6.5%. The report showed a low blood sugar ~ 3am one time and a very large meal that day following the low.  She is trying to cut back her portions, has halved her bread intake. Her physical activity has not changed.   Her weight and a1c are both significantly decreased: weight 175 to 165.6 # and a1c 8.9 to 7.3%  ANTHROPOMETRICS:Estimated body mass index is 28.87 kg/m as calculated from the following:   Height as of 06/22/21: 5' 3.5" (1.613 m).   Weight as of this encounter: 165 lb 9.6 oz (75.1 kg).  WEIGHT HISTORY:  Wt Readings from Last 10 Encounters:  07/11/21 165 lb 9.6 oz (75.1 kg)  07/11/21 165 lb 9.6 oz (75.1 kg)  06/27/21 166 lb 4.8 oz (75.4 kg)  06/22/21 167 lb 8 oz (76 kg)  04/06/21 175 lb 8 oz (79.6 kg)  03/29/21 175 lb 8 oz (79.6 kg)  03/29/21 175 lb 8 oz (79.6 kg)  03/22/21 175 lb 3.2 oz (79.5 kg)  02/10/21 177 lb 6.4 oz (80.5 kg)  12/22/20 177 lb 9.6 oz (80.6 kg)   SLEEP:good from 7 PM to 5 AM MEDICATIONS: Diabetes medicines: 20 units of semglee daily,  Truclicity 3 mg weekly, (covered at 100% because she participates with active health  management through [place] and is insured by them), metformin 1000 mg twice daily  BLOOD SUGAR: Lab Results  Component Value Date   HGBA1C 7.3 (A) 06/27/2021   HGBA1C 8.3 (A) 03/22/2021   HGBA1C 8.9 (A) 12/22/2020   HGBA1C 8.2 (A) 09/08/2020   HGBA1C 8.6 (A) 04/28/2020     DIETARY INTAKE: she  is eating  mostly carbs at breakfast and lunch, well balanced dinner and few snacks Breakfast- applesauce, peanut butter crackers Lunch- cantaloupe, Dinner baked chicken, potatoes and kale Drinks mostly water  Usual physical activity: Monday through Friday- her work can be active, weekends-  chores and housework   Progress Towards Goal(s):  Some progress.   Nutritional Diagnosis:  NB-1.1 Food and nutrition-related knowledge deficit As related to not knowing how different foods and diabetes medicinces affect her glucose and feels this information will guide her to make changes As evidenced by her report and questions.    Intervention:  Nutrition education about CGM report. Suggestions and handouts to help with adding more protein provided. Action Goal: continue with decreasing your carbs and increasing protein and veggies  Outcome goal: improved knowledge about diabetes self management Coordination of care: spoke with Dr.  Coe today about increasing her GLP-1 and decreasing her insulin.s  Teaching Method Utilized: Visual, Auditory,Hands on Handouts given during visit include:avs Barriers to learning/adherence to lifestyle change: competing values Demonstrated degree of understanding via:  Teach Back   Monitoring/Evaluation:  Dietary intake, exercise, meter and body weight in 4 week(s). Debera Lat, RD 07/11/2021 4:00 PM.

## 2021-07-11 NOTE — Assessment & Plan Note (Signed)
Patient has a history of primary hyperparathyroidism status post parathyroid removal.  She continues to have a mild elevation in her calcium in the low tens.  This will need to be reevaluated at her follow-up appointment in 3 months.  She will need a CMP and potentially repeat PTH and vitamin D levels at that time.  Otherwise she denies any significant symptoms of hypercalcemia today on evaluation.

## 2021-07-11 NOTE — Assessment & Plan Note (Signed)
Assessment: Patient presents for reevaluation of her diabetes.  Currently her hemoglobin [id number] was 7.3 on Trulicity 3 mg weekly and Semglee 22 units nightly and metformin 1000 mg daily.  Her glucometer shows predominantly within range blood sugars with 1 low blood sugar in the 70s.  No significant hyperglycemic episodes.  Denies any symptoms from her diabetes or medication management.  Patient has had some weight loss since starting cynically which is encouraging.  Diabetic foot exam was performed today with no significant evidence of neuropathy   Plan: -Increase Trulicity to 4 mg weekly -Decrease Semglee to 10 units nightly -Continue metformin 1000 mg daily -Counseled her on diabetic eye exam yearly -We will follow-up with her in 3 months for repeat CGM

## 2021-07-11 NOTE — Patient Instructions (Signed)
Thank you, Ms.Rich Number for allowing [place] to provide your care today. Today we discussed diabetes and blood pressure.    I have ordered the following labs for you:  Lab Orders  No laboratory test(s) ordered today     Tests ordered today:  none  Referrals ordered today:   Referral Orders  No referral(s) requested today     Medication Changes:   Medications Discontinued During This Encounter  Medication Reason   Dulaglutide (TRULICITY) 3 0000000 SOPN    Insulin Glargine-yfgn (SEMGLEE, YFGN,) 100 UNIT/ML SOPN      Meds ordered this encounter  Medications   Dulaglutide (TRULICITY) 4.5 0000000 SOPN    Sig: Inject 4.5 mg as directed once a week.    Dispense:  2 mL    Refill:  2    IM program   insulin glargine-yfgn (SEMGLEE, YFGN,) 100 UNIT/ML Pen    Sig: Inject 10 Units into the skin at bedtime.    Dispense:  3 mL    Refill:  2    IM program     Follow up: 3 months   Remember:    Should you have any questions or concerns please call the internal medicine clinic at [phone].     Marianna Payment, D.O. [place]

## 2021-07-11 NOTE — Assessment & Plan Note (Signed)
Patient has a history of hyperlipidemia.  She is on atorvastatin 40 mg daily.  She will need a repeat lipid panel at her follow-up appointment.

## 2021-07-11 NOTE — Assessment & Plan Note (Addendum)
Assessment: Patient presents for reevaluation of her hypertension.  Her blood pressure today is 115/80 on metoprolol 25 mg, furosemide 40 mg, lisinopril 10 mg.  There is no evidence of previous echocardiograms to evaluate for heart failure.  Therefore, we will need to reconsider furosemide unless the patient has an indication for this medication.    Plan: -We will need metabolic panel at her follow-up appointment -Continue current antihypertensive medications.

## 2021-07-11 NOTE — Progress Notes (Signed)
CC: DM  HPI:  Michele Andrews is a 70 y.o. female with a past medical history stated below and presents today for DM. Please see problem based assessment and plan for additional details.  Past Medical History:  Diagnosis Date   Carpal tunnel syndrome    Diabetes mellitus    Type II- diagnosed 30 yrs. ago   Family history of adverse reaction to anesthesia    20 YRS. AGO, HER 20 YR. OLD NEPHEW, ENDED UP ON VENTILATOR- PT. THINKS ITS BECAUSE HE ASPIRATED    Hypercalcemia    Hyperlipidemia    Hypertension    Osteopenia    Rotator cuff syndrome of right shoulder     Current Outpatient Medications on File Prior to Visit  Medication Sig Dispense Refill   acetaminophen (TYLENOL) 650 MG CR tablet Take 650 mg by mouth every 8 (eight) hours as needed for pain.     atorvastatin (LIPITOR) 40 MG tablet Take 1 tablet (40 mg total) by mouth daily. 90 tablet 3   Blood Glucose Monitoring Suppl (FREESTYLE FREEDOM LITE) w/Device KIT 1 each by Does not apply route 3 (three) times daily before meals. 1 kit 0   Blood Glucose Monitoring Suppl (TRUETRACK BLOOD GLUCOSE) W/DEVICE KIT Use to check blood sugar 2 times a day      Cholecalciferol (VITAMIN D3) 2000 units TABS Take 2,000 Units by mouth daily.     COVID-19 At Home Antigen Test [facility] COVID-19 HOME TEST) KIT Use as directed 4 each 0   cyclobenzaprine (FLEXERIL) 5 MG tablet Take 1 tablet (5 mg total) by mouth 3 (three) times daily as needed for muscle spasms. 21 tablet 0   diclofenac Sodium (VOLTAREN) 1 % GEL APPLY 2 GRAMS TOPICALLY 4 TIMES DAILY. 300 g 0   fluticasone (FLONASE) 50 MCG/ACT nasal spray Place into the nose.     furosemide (LASIX) 40 MG tablet Take 1 tablet (40 mg total) by mouth daily. 90 tablet 1   glucose blood (FREESTYLE LITE) test strip Use to check blood sugar 2 times a day. 100 each 12   hydroxypropyl methylcellulose / hypromellose (ISOPTO TEARS / GONIOVISC) 2.5 % ophthalmic solution Place 1-2 drops into both eyes 3  (three) times daily as needed for dry eyes.     ibuprofen (ADVIL) 800 MG tablet TAKE 1 TABLET BY MOUTH EVERY 8 HOURS AS NEEDED FOR PAIN 30 tablet 1   Insulin Pen Needle 31G X 5 MM MISC USE AS DIRECTED 3 TIMES DAILY 100 each 5   Lancets (FREESTYLE) lancets Use 2 times daily to check blood sugar 300 each 1   lisinopril (ZESTRIL) 10 MG tablet Take 1 tablet (10 mg total) by mouth daily. 90 tablet 1   metFORMIN (GLUCOPHAGE) 1000 MG tablet TAKE 1 TABLET (1,000 MG TOTAL) BY MOUTH 2 TIMES DAILY WITH A MEAL. 180 tablet 3   metoprolol succinate (TOPROL-XL) 25 MG 24 hr tablet TAKE 1 TABLET BY MOUTH DAILY BEFORE BREAKFAST 90 tablet 1   Zoster Vaccine Adjuvanted (SHINGRIX) injection TO BE ADMINISTERED BY PHARMACIST 1 each 1   [DISCONTINUED] insulin glargine (SEMGLEE) 100 UNIT/ML injection Inject 0.18 mLs (18 Units total) into the skin at bedtime. 10 mL 11   [DISCONTINUED] sitaGLIPtin (JANUVIA) 100 MG tablet TAKE 1 TABLET (100 MG TOTAL) BY MOUTH DAILY. 90 tablet 1   No current facility-administered medications on file prior to visit.    Family History  Problem Relation Age of Onset   Hypertension Mother    Cancer Mother  Breast cancer Mother 4   Breast cancer Maternal Aunt 60   Breast cancer Cousin 45    Social History   Socioeconomic History   Marital status: Married    Spouse name: Not on file   Number of children: Not on file   Years of education: 12   Highest education level: Not on file  Occupational History   Occupation: Research scientist (life sciences): [employer]  Tobacco Use   Smoking status: Never   Smokeless tobacco: Never  Substance and Sexual Activity   Alcohol use: No   Drug use: No   Sexual activity: Not on file  Other Topics Concern   Not on file  Social History Narrative   Not on file   Social Determinants of Health   Financial Resource Strain: Not on file  Food Insecurity: Not on file  Transportation Needs: Not on file  Physical Activity: Not on file  Stress: Not on  file  Social Connections: Not on file  Intimate Partner Violence: Not on file    Review of Systems: ROS negative except for what is noted on the assessment and plan.  Vitals:   07/11/21 1540  BP: 115/80  Pulse: 98  Temp: 98.4 F (36.9 C)  TempSrc: Oral  SpO2: 100%  Weight: 165 lb 9.6 oz (75.1 kg)  Height: 5' 3.5" (1.613 m)    Physical Exam: Gen: A&O x3 and in no apparent distress, well appearing and nourished. HEENT: Head - normocephalic, atraumatic. Eye -  visual acuity grossly intact, conjunctiva clear, sclera non-icteric, EOM intact. Mouth - No obvious caries or periodontal disease. Neck: no obvious masses or nodules, AROM intact. CV: RRR, no murmurs, rubs, or gallops. S1/S2 presents  Resp: Clear to ascultation bilaterally  Abd: BS (+) x4, soft, non-tender, without obvious hepatosplenomegaly or masses MSK: Grossly normal AROM and strength x4 extremities. Skin: good skin turgor, no rashes, unusual bruising, or prominent lesions.  Neuro: No focal deficits, grossly normal sensation and coordination.  Psych: Oriented x3 and responding appropriately. Intact recent and remote memory, normal mood, judgement, affect , and insight.    Assessment & Plan:   See Encounters Tab for problem based charting.  Patient discussed with Dr. Kelle Darting, D.O. [facility], PGY-3 Pager: [phone], Phone: [phone] Date 07/11/2021 Time 4:55 PM y.

## 2021-07-12 ENCOUNTER — Other Ambulatory Visit (HOSPITAL_COMMUNITY): Payer: Self-pay

## 2021-07-12 MED ORDER — TRULICITY 4.5 MG/0.5ML ~~LOC~~ SOAJ
4.5000 mg | SUBCUTANEOUS | 0 refills | Status: DC
  Filled 2021-07-12: qty 6, 84d supply, fill #0

## 2021-07-12 NOTE — Addendum Note (Signed)
Addended by: Lawerance Cruel on: 07/12/2021 08:31 AM   Modules accepted: Orders

## 2021-07-14 ENCOUNTER — Other Ambulatory Visit (HOSPITAL_COMMUNITY): Payer: Self-pay

## 2021-07-18 NOTE — Progress Notes (Signed)
Internal Medicine Clinic Attending  Case discussed with Dr. Coe  At the time of the visit.  We reviewed the resident's history and exam and pertinent patient test results.  I agree with the assessment, diagnosis, and plan of care documented in the resident's note.  

## 2021-07-19 ENCOUNTER — Other Ambulatory Visit (HOSPITAL_COMMUNITY): Payer: Self-pay

## 2021-07-19 ENCOUNTER — Other Ambulatory Visit: Payer: Self-pay | Admitting: Student

## 2021-07-19 ENCOUNTER — Other Ambulatory Visit: Payer: Self-pay | Admitting: Internal Medicine

## 2021-07-19 DIAGNOSIS — I10 Essential (primary) hypertension: Secondary | ICD-10-CM

## 2021-07-20 ENCOUNTER — Other Ambulatory Visit (HOSPITAL_COMMUNITY): Payer: Self-pay

## 2021-07-20 ENCOUNTER — Other Ambulatory Visit: Payer: Self-pay | Admitting: Internal Medicine

## 2021-07-20 DIAGNOSIS — I10 Essential (primary) hypertension: Secondary | ICD-10-CM

## 2021-07-20 MED ORDER — METOPROLOL SUCCINATE ER 25 MG PO TB24
25.0000 mg | ORAL_TABLET | Freq: Every day | ORAL | 1 refills | Status: DC
  Filled 2021-07-20: qty 90, 90d supply, fill #0
  Filled 2021-10-19: qty 90, 90d supply, fill #1

## 2021-07-25 ENCOUNTER — Other Ambulatory Visit (HOSPITAL_COMMUNITY): Payer: Self-pay

## 2021-07-27 ENCOUNTER — Other Ambulatory Visit (HOSPITAL_COMMUNITY): Payer: Self-pay

## 2021-07-27 ENCOUNTER — Other Ambulatory Visit: Payer: Self-pay | Admitting: Internal Medicine

## 2021-07-27 DIAGNOSIS — M25512 Pain in left shoulder: Secondary | ICD-10-CM

## 2021-07-28 ENCOUNTER — Other Ambulatory Visit (HOSPITAL_COMMUNITY): Payer: Self-pay

## 2021-07-28 MED ORDER — IBUPROFEN 800 MG PO TABS
800.0000 mg | ORAL_TABLET | Freq: Three times a day (TID) | ORAL | 1 refills | Status: DC | PRN
  Filled 2021-07-28: qty 30, 10d supply, fill #0
  Filled 2021-10-21: qty 30, 10d supply, fill #1

## 2021-08-18 ENCOUNTER — Other Ambulatory Visit (HOSPITAL_COMMUNITY): Payer: Self-pay

## 2021-08-18 MED FILL — Metformin HCl Tab 1000 MG: ORAL | 90 days supply | Qty: 180 | Fill #1 | Status: AC

## 2021-08-23 ENCOUNTER — Other Ambulatory Visit (HOSPITAL_COMMUNITY): Payer: Self-pay

## 2021-08-23 MED ORDER — PREDNISOLONE ACETATE 1 % OP SUSP
OPHTHALMIC | 1 refills | Status: DC
  Filled 2021-08-23: qty 10, 50d supply, fill #0
  Filled 2022-01-25: qty 10, 50d supply, fill #1

## 2021-08-23 MED ORDER — PROLENSA 0.07 % OP SOLN
OPHTHALMIC | 1 refills | Status: DC
  Filled 2021-08-23: qty 3, 60d supply, fill #0
  Filled 2022-01-25 – 2022-04-21 (×2): qty 3, 60d supply, fill #1

## 2021-08-23 MED ORDER — OFLOXACIN 0.3 % OP SOLN
1.0000 [drp] | Freq: Four times a day (QID) | OPHTHALMIC | 1 refills | Status: DC
  Filled 2021-08-23: qty 5, 25d supply, fill #0

## 2021-08-24 ENCOUNTER — Other Ambulatory Visit (HOSPITAL_COMMUNITY): Payer: Self-pay

## 2021-08-25 ENCOUNTER — Other Ambulatory Visit (HOSPITAL_COMMUNITY): Payer: Self-pay

## 2021-08-26 ENCOUNTER — Other Ambulatory Visit (HOSPITAL_COMMUNITY): Payer: Self-pay

## 2021-09-22 ENCOUNTER — Other Ambulatory Visit (HOSPITAL_COMMUNITY): Payer: Self-pay

## 2021-09-22 ENCOUNTER — Ambulatory Visit (INDEPENDENT_AMBULATORY_CARE_PROVIDER_SITE_OTHER): Payer: No Typology Code available for payment source | Admitting: Internal Medicine

## 2021-09-22 DIAGNOSIS — J069 Acute upper respiratory infection, unspecified: Secondary | ICD-10-CM

## 2021-09-22 MED ORDER — CROMOLYN SODIUM 5.2 MG/ACT NA AERS
1.0000 | INHALATION_SPRAY | Freq: Three times a day (TID) | NASAL | 0 refills | Status: DC
  Filled 2021-09-22: qty 26, 33d supply, fill #0

## 2021-09-22 NOTE — Assessment & Plan Note (Signed)
Patient coming in with symptoms suggesting a viral URI. She was negative on a home COVID test. She is still able to complete her ADLs, denies trouble breathing, sputum production, lightheadedness, or dizziness. Burtis Junes that current presentation is a viral illness. Discussed expectations and symptom management and return precautions. Low suspicion for CAP or bacterial infection requiring antibiotics at this time. If patient does not begin to feel better in the next week told her to call back to schedule another appointment. - recommended Neti pot - cromolyn nasal spray

## 2021-09-22 NOTE — Progress Notes (Signed)
  [facility] Residency Telephone Encounter Continuity Care Appointment  HPI:  This telephone encounter was created for [name] on 09/22/2021 for the following purpose/cc cough Patient describes a cough that has been going on for about a week. She has not had any sore throat, congestions, fevers, or trouble breathing. She has been able to do all of her activities of daily living and has not been limited by [facility]. She does not have any mucous/sputum production. She has no sick contacts. She has not taken any medications for the cough but has tried drinking a lot of water and eating a lot of soup.   Past Medical History:  Past Medical History:  Diagnosis Date   Carpal tunnel syndrome    Diabetes mellitus    Type II- diagnosed 30 yrs. ago   Family history of adverse reaction to anesthesia    20 YRS. AGO, HER 20 YR. OLD NEPHEW, ENDED UP ON VENTILATOR- PT. THINKS ITS BECAUSE HE ASPIRATED    Hypercalcemia    Hyperlipidemia    Hypertension    Osteopenia    Rotator cuff syndrome of right shoulder      ROS:  Positive for cough, negative for fever, SHOB, congestion, sore throat   Assessment / Plan / Recommendations:  Please see A&P under problem oriented charting for assessment of the patient's acute and chronic medical conditions.  As always, pt is advised that if symptoms worsen or new symptoms arise, they should go to an urgent care facility or to to ER for further evaluation.   Consent and Medical Decision Making:  Patient discussed with Dr. Evette Doffing This is a telephone encounter between Michele Andrews and Dason Mosley on 09/22/2021 for cough. The visit was conducted with the patient located at home and [facility] at [facility]. The patient's identity was confirmed using their DOB and current address. The patient has consented to being evaluated through a telephone encounter and understands the associated risks (an examination cannot be done and the patient may need  to come in for an appointment) / benefits (allows the patient to remain at home, decreasing exposure to coronavirus). I personally spent 15 minutes on medical discussion.

## 2021-09-23 ENCOUNTER — Other Ambulatory Visit (HOSPITAL_COMMUNITY): Payer: Self-pay

## 2021-09-23 NOTE — Progress Notes (Signed)
Internal Medicine Clinic Attending  Case discussed with Dr. Ileene Musa  At the time of the visit. I spoke with the patient by phone and confirmed the history.  We reviewed the resident's history and exam and pertinent patient test results.  I agree with the assessment, diagnosis, and plan of care documented in the resident's note.

## 2021-09-23 NOTE — Addendum Note (Signed)
Addended by: Lalla Brothers T on: 09/23/2021 11:11 AM   Modules accepted: Level of Service

## 2021-09-27 ENCOUNTER — Other Ambulatory Visit (HOSPITAL_COMMUNITY): Payer: Self-pay

## 2021-10-04 ENCOUNTER — Other Ambulatory Visit: Payer: Self-pay | Admitting: Internal Medicine

## 2021-10-04 ENCOUNTER — Other Ambulatory Visit (HOSPITAL_COMMUNITY): Payer: Self-pay

## 2021-10-04 DIAGNOSIS — E119 Type 2 diabetes mellitus without complications: Secondary | ICD-10-CM

## 2021-10-04 DIAGNOSIS — Z794 Long term (current) use of insulin: Secondary | ICD-10-CM

## 2021-10-05 ENCOUNTER — Other Ambulatory Visit: Payer: Self-pay | Admitting: Internal Medicine

## 2021-10-05 ENCOUNTER — Other Ambulatory Visit (HOSPITAL_COMMUNITY): Payer: Self-pay

## 2021-10-05 DIAGNOSIS — E119 Type 2 diabetes mellitus without complications: Secondary | ICD-10-CM

## 2021-10-05 MED ORDER — METFORMIN HCL 1000 MG PO TABS
1000.0000 mg | ORAL_TABLET | Freq: Two times a day (BID) | ORAL | 3 refills | Status: DC
  Filled 2021-10-05: qty 180, fill #0
  Filled 2021-10-06 – 2021-11-28 (×2): qty 180, 90d supply, fill #0
  Filled 2022-03-09: qty 180, 90d supply, fill #1
  Filled 2022-06-26: qty 180, 90d supply, fill #2

## 2021-10-05 NOTE — Telephone Encounter (Signed)
This refill request was sent from [facility] yesterday at 1737 for the first time, and was sent to PCP at 1027 today. The Rx was cancelled from earlier request (not sure how) and new request was sent today at 1259 stating it was 2nd attempt. Patient called in stating Pharmacy told her they are waiting on this refill from [place].  Spoke with Manuela Schwartz at [facility] regarding similar instance with separate patient. Manuela Schwartz stated a new request is sent every time patient calls in or sends request through [place].   Will forward to PCP to address med refills and to [place], Janit Bern for help with this as this is not an isolated instance.

## 2021-10-05 NOTE — Telephone Encounter (Signed)
Per pt the pharmacy is waiting for the clinic to reply back on metFORMIN (GLUCOPHAGE) 1000 MG tablet. Please call pt back.

## 2021-10-05 NOTE — Telephone Encounter (Signed)
Approved metformin refill. Please have patient schedule follow up with me or one of the residents for her diabetes.

## 2021-10-06 ENCOUNTER — Other Ambulatory Visit (HOSPITAL_COMMUNITY): Payer: Self-pay

## 2021-10-06 MED ORDER — TRULICITY 4.5 MG/0.5ML ~~LOC~~ SOAJ
4.5000 mg | SUBCUTANEOUS | 2 refills | Status: DC
  Filled 2021-10-06: qty 2, 28d supply, fill #0
  Filled 2021-10-31: qty 2, 28d supply, fill #1
  Filled 2021-11-28: qty 2, 28d supply, fill #2

## 2021-10-06 NOTE — Telephone Encounter (Signed)
I'm confused. I approved her metformin refill yesterday and this is the first time I am seeing this refill request for trulicity. I have approved the Rx. She needs to schedule in person follow up for her diabetes, either with me or the residents. Thanks.

## 2021-10-11 ENCOUNTER — Other Ambulatory Visit (HOSPITAL_COMMUNITY): Payer: Self-pay

## 2021-10-19 ENCOUNTER — Other Ambulatory Visit (HOSPITAL_COMMUNITY): Payer: Self-pay

## 2021-10-21 ENCOUNTER — Other Ambulatory Visit (HOSPITAL_COMMUNITY): Payer: Self-pay

## 2021-10-21 ENCOUNTER — Other Ambulatory Visit: Payer: Self-pay

## 2021-10-25 ENCOUNTER — Other Ambulatory Visit: Payer: Self-pay | Admitting: Internal Medicine

## 2021-10-25 ENCOUNTER — Other Ambulatory Visit (HOSPITAL_COMMUNITY): Payer: Self-pay

## 2021-10-25 DIAGNOSIS — I10 Essential (primary) hypertension: Secondary | ICD-10-CM

## 2021-10-26 ENCOUNTER — Other Ambulatory Visit (HOSPITAL_COMMUNITY): Payer: Self-pay

## 2021-10-26 MED ORDER — LISINOPRIL 10 MG PO TABS
10.0000 mg | ORAL_TABLET | Freq: Every day | ORAL | 3 refills | Status: DC
  Filled 2021-10-26: qty 90, 90d supply, fill #0
  Filled 2022-01-25: qty 90, 90d supply, fill #1
  Filled 2022-04-18: qty 90, 90d supply, fill #2
  Filled 2022-07-13: qty 90, 90d supply, fill #3

## 2021-10-31 ENCOUNTER — Other Ambulatory Visit (HOSPITAL_COMMUNITY): Payer: Self-pay

## 2021-10-31 ENCOUNTER — Other Ambulatory Visit: Payer: Self-pay

## 2021-10-31 DIAGNOSIS — Z794 Long term (current) use of insulin: Secondary | ICD-10-CM

## 2021-10-31 DIAGNOSIS — E119 Type 2 diabetes mellitus without complications: Secondary | ICD-10-CM

## 2021-10-31 MED ORDER — INSULIN GLARGINE-YFGN 100 UNIT/ML ~~LOC~~ SOPN
10.0000 [IU] | PEN_INJECTOR | Freq: Every day | SUBCUTANEOUS | 2 refills | Status: DC
  Filled 2021-10-31: qty 3, 30d supply, fill #0

## 2021-10-31 NOTE — Telephone Encounter (Signed)
insulin glargine-yfgn (SEMGLEE, YFGN,) 100 UNIT/ML Pen, refill request @ [facility].

## 2021-11-01 ENCOUNTER — Other Ambulatory Visit (HOSPITAL_COMMUNITY): Payer: Self-pay

## 2021-11-01 MED FILL — Diclofenac Sodium Gel 1% (1.16% Diethylamine Equiv): CUTANEOUS | 13 days supply | Qty: 100 | Fill #0 | Status: AC

## 2021-11-02 ENCOUNTER — Other Ambulatory Visit (HOSPITAL_COMMUNITY): Payer: Self-pay

## 2021-11-23 ENCOUNTER — Encounter: Payer: No Typology Code available for payment source | Admitting: Internal Medicine

## 2021-11-23 ENCOUNTER — Encounter: Payer: Self-pay | Admitting: Internal Medicine

## 2021-11-23 ENCOUNTER — Other Ambulatory Visit (HOSPITAL_COMMUNITY): Payer: Self-pay

## 2021-11-23 ENCOUNTER — Ambulatory Visit (INDEPENDENT_AMBULATORY_CARE_PROVIDER_SITE_OTHER): Payer: No Typology Code available for payment source | Admitting: Internal Medicine

## 2021-11-23 ENCOUNTER — Other Ambulatory Visit: Payer: Self-pay

## 2021-11-23 VITALS — BP 121/75 | HR 107 | Temp 98.2°F | Ht 64.0 in | Wt 160.2 lb

## 2021-11-23 DIAGNOSIS — Z794 Long term (current) use of insulin: Secondary | ICD-10-CM

## 2021-11-23 DIAGNOSIS — I10 Essential (primary) hypertension: Secondary | ICD-10-CM

## 2021-11-23 DIAGNOSIS — E119 Type 2 diabetes mellitus without complications: Secondary | ICD-10-CM | POA: Diagnosis not present

## 2021-11-23 DIAGNOSIS — E782 Mixed hyperlipidemia: Secondary | ICD-10-CM

## 2021-11-23 DIAGNOSIS — E210 Primary hyperparathyroidism: Secondary | ICD-10-CM

## 2021-11-23 DIAGNOSIS — Z0000 Encounter for general adult medical examination without abnormal findings: Secondary | ICD-10-CM

## 2021-11-23 DIAGNOSIS — E785 Hyperlipidemia, unspecified: Secondary | ICD-10-CM

## 2021-11-23 DIAGNOSIS — Z1211 Encounter for screening for malignant neoplasm of colon: Secondary | ICD-10-CM

## 2021-11-23 LAB — POCT GLYCOSYLATED HEMOGLOBIN (HGB A1C): Hemoglobin A1C: 7.8 % — AB (ref 4.0–5.6)

## 2021-11-23 LAB — GLUCOSE, CAPILLARY: Glucose-Capillary: 181 mg/dL — ABNORMAL HIGH (ref 70–99)

## 2021-11-23 MED ORDER — ATORVASTATIN CALCIUM 40 MG PO TABS
40.0000 mg | ORAL_TABLET | Freq: Every day | ORAL | 11 refills | Status: DC
  Filled 2021-11-23: qty 30, 30d supply, fill #0
  Filled 2021-12-26: qty 30, 30d supply, fill #1
  Filled 2022-01-25: qty 30, 30d supply, fill #2
  Filled 2022-02-22: qty 90, 90d supply, fill #3
  Filled 2022-02-22: qty 30, 30d supply, fill #3
  Filled 2022-05-29: qty 90, 90d supply, fill #4
  Filled 2022-09-06: qty 90, 90d supply, fill #5

## 2021-11-23 MED ORDER — INSULIN GLARGINE-YFGN 100 UNIT/ML ~~LOC~~ SOPN
15.0000 [IU] | PEN_INJECTOR | Freq: Every day | SUBCUTANEOUS | 2 refills | Status: DC
  Filled 2021-11-23: qty 6, 40d supply, fill #0
  Filled 2021-11-23: qty 4.5, 30d supply, fill #0
  Filled 2022-01-05 (×2): qty 6, 40d supply, fill #1
  Filled 2022-02-22: qty 6, 40d supply, fill #2

## 2021-11-23 NOTE — Patient Instructions (Signed)
Thank you, Ms.Rich Number for allowing [place] to provide your care today. Today we discussed diabetes, blood pressure, cholesterol.    Labs/Tests Ordered:  Lab Orders         Fecal occult blood, imunochemical         Glucose, capillary         BMP8+Anion Gap         Microalbumin / Creatinine Urine Ratio         Lipid Profile         POC Hbg A1C      Referrals Ordered:  Referral Orders  No referral(s) requested today     Medication Changes:  Medications Discontinued During This Encounter  Medication Reason   furosemide (LASIX) 40 MG tablet          Meds ordered this encounter  Medications   atorvastatin (LIPITOR) 40 MG tablet    Sig: Take 1 tablet (40 mg total) by mouth daily.    Dispense:  30 tablet    Refill:  11   insulin glargine-yfgn (SEMGLEE, YFGN,) 100 UNIT/ML Pen    Sig: Inject 15 Units into the skin at bedtime.    Dispense:  4.5 mL    Refill:  2    IM program     Health Maintenance Screening: Diabetes Health Maintenance Due  Topic Date Due   LIPID PANEL  02/22/2017   FOOT EXAM  10/26/2020   HEMOGLOBIN A1C  09/27/2021   OPHTHALMOLOGY EXAM  12/29/2021     Instructions:   Follow up:  1 month with Dr. Philipp Ovens    Remember: If you have any questions or concerns, call our clinic at [phone] or after hours call [phone] and ask for the internal medicine resident on call.  Marianna Payment, D.O. [place]

## 2021-11-23 NOTE — Progress Notes (Signed)
Subjective:  CC: DM  HPI:  Michele Andrews is a 70 y.o. female with a past medical history stated below and presents today for DM. Please see problem based assessment and plan for additional details.  Past Medical History:  Diagnosis Date   Carpal tunnel syndrome    Diabetes mellitus    Type II- diagnosed 30 yrs. ago   Family history of adverse reaction to anesthesia    20 YRS. AGO, HER 20 YR. OLD NEPHEW, ENDED UP ON VENTILATOR- PT. THINKS ITS BECAUSE HE ASPIRATED    Hypercalcemia    Hyperlipidemia    Hypertension    Osteopenia    Rotator cuff syndrome of right shoulder     Current Outpatient Medications on File Prior to Visit  Medication Sig Dispense Refill   acetaminophen (TYLENOL) 650 MG CR tablet Take 650 mg by mouth every 8 (eight) hours as needed for pain.     Blood Glucose Monitoring Suppl (FREESTYLE FREEDOM LITE) w/Device KIT 1 each by Does not apply route 3 (three) times daily before meals. 1 kit 0   Blood Glucose Monitoring Suppl (TRUETRACK BLOOD GLUCOSE) W/DEVICE KIT Use to check blood sugar 2 times a day      Cholecalciferol (VITAMIN D3) 2000 units TABS Take 2,000 Units by mouth daily.     COVID-19 At Home Antigen Test [facility] COVID-19 HOME TEST) KIT Use as directed 4 each 0   cromolyn (NASALCROM) 5.2 MG/ACT nasal spray Place 1 spray into both nostrils 3 (three) times daily for 14 days. 26 mL 0   diclofenac Sodium (VOLTAREN) 1 % GEL APPLY 2 GRAMS TOPICALLY 4 TIMES DAILY. 300 g 0   Dulaglutide (TRULICITY) 4.5 JS/2.8BT SOPN Inject 4.5 mg as directed once a week. 2 mL 2   fluticasone (FLONASE) 50 MCG/ACT nasal spray Place into the nose.     glucose blood (FREESTYLE LITE) test strip Use to check blood sugar 2 times a day. 100 each 12   hydroxypropyl methylcellulose / hypromellose (ISOPTO TEARS / GONIOVISC) 2.5 % ophthalmic solution Place 1-2 drops into both eyes 3 (three) times daily as needed for dry eyes.     ibuprofen (ADVIL) 800 MG tablet Take 1 tablet (800  mg total) by mouth every 8 (eight) hours as needed for pain 30 tablet 1   Insulin Pen Needle 31G X 5 MM MISC USE AS DIRECTED 3 TIMES DAILY 100 each 5   Lancets (FREESTYLE) lancets Use 2 times daily to check blood sugar 300 each 1   lisinopril (ZESTRIL) 10 MG tablet Take 1 tablet (10 mg total) by mouth daily. 90 tablet 3   metFORMIN (GLUCOPHAGE) 1000 MG tablet Take 1 tablet (1,000 mg total) by mouth 2 (two) times daily with a meal. 180 tablet 3   metoprolol succinate (TOPROL-XL) 25 MG 24 hr tablet Take 1 tablet (25 mg total) by mouth daily before breakfast. 90 tablet 1   ofloxacin (OCUFLOX) 0.3 % ophthalmic solution Instill 1 drop into right eye four times a day starting 1 day before surgery 5 mL 1   prednisoLONE acetate (PRED FORTE) 1 % ophthalmic suspension Instill 1 drop into right eye four times a day after surgery 10 mL 1   PROLENSA 0.07 % SOLN Instill 1 drop into right eye once a day starting one day before surgery 3 mL 1   [DISCONTINUED] insulin glargine (SEMGLEE) 100 UNIT/ML injection Inject 0.18 mLs (18 Units total) into the skin at bedtime. 10 mL 11   [DISCONTINUED] sitaGLIPtin (  JANUVIA) 100 MG tablet TAKE 1 TABLET (100 MG TOTAL) BY MOUTH DAILY. 90 tablet 1   No current facility-administered medications on file prior to visit.    Family History  Problem Relation Age of Onset   Hypertension Mother    Cancer Mother    Breast cancer Mother 66   Breast cancer Maternal Aunt 64   Breast cancer Cousin 64    Social History   Socioeconomic History   Marital status: Married    Spouse name: Not on file   Number of children: Not on file   Years of education: 12   Highest education level: Not on file  Occupational History   Occupation: Research scientist (life sciences): [employer]  Tobacco Use   Smoking status: Never   Smokeless tobacco: Never  Substance and Sexual Activity   Alcohol use: No   Drug use: No   Sexual activity: Not on file  Other Topics Concern   Not on file  Social  History Narrative   Not on file   Social Determinants of Health   Financial Resource Strain: Not on file  Food Insecurity: Not on file  Transportation Needs: Not on file  Physical Activity: Not on file  Stress: Not on file  Social Connections: Not on file  Intimate Partner Violence: Not on file    Review of Systems: ROS negative except for what is noted on the assessment and plan.  Objective:   Vitals:   11/23/21 1510  BP: 121/75  Pulse: (!) 107  Temp: 98.2 F (36.8 C)  TempSrc: Oral  SpO2: 99%  Weight: 160 lb 3.2 oz (72.7 kg)  Height: 5' 4"  (1.626 m)    Physical Exam: Gen: A&O x3 and in no apparent distress, well appearing and nourished. Neck: no masses or nodules, AROM intact. CV: RRR, no murmurs, S1/S2 presents  Resp: Clear to ascultation bilaterally  Abd: BS (+) x4, soft, non-tender abdomen, without hepatosplenomegaly or masses MSK: Grossly normal AROM and strength x4 extremities. Skin: good skin turgor, no rashes, unusual bruising, or prominent lesions.  Neuro: No focal deficits, grossly normal sensation and coordination.     Assessment & Plan:  See Encounters Tab for problem based charting.  Patient discussed with Dr. Hilbert Odor, D.O. [place] [facility]   PGY-3 Pager: [phone]   Phone: [phone] Date 11/26/2021   Time 8:56 PM

## 2021-11-24 ENCOUNTER — Other Ambulatory Visit (HOSPITAL_COMMUNITY): Payer: Self-pay

## 2021-11-24 ENCOUNTER — Other Ambulatory Visit: Payer: No Typology Code available for payment source

## 2021-11-24 LAB — BMP8+ANION GAP
Anion Gap: 18 mmol/L (ref 10.0–18.0)
BUN/Creatinine Ratio: 16 (ref 12–28)
BUN: 12 mg/dL (ref 8–27)
CO2: 23 mmol/L (ref 20–29)
Calcium: 10.3 mg/dL (ref 8.7–10.3)
Chloride: 95 mmol/L — ABNORMAL LOW (ref 96–106)
Creatinine, Ser: 0.76 mg/dL (ref 0.57–1.00)
Glucose: 200 mg/dL — ABNORMAL HIGH (ref 70–99)
Potassium: 4.5 mmol/L (ref 3.5–5.2)
Sodium: 136 mmol/L (ref 134–144)
eGFR: 84 mL/min/{1.73_m2} (ref >59)

## 2021-11-24 LAB — LIPID PANEL
Chol/HDL Ratio: 4.6 ratio — ABNORMAL HIGH (ref 0.0–4.4)
Cholesterol, Total: 240 mg/dL — ABNORMAL HIGH (ref 100–199)
HDL: 52 mg/dL (ref >39)
LDL Chol Calc (NIH): 143 mg/dL — ABNORMAL HIGH (ref 0–99)
Triglycerides: 251 mg/dL — ABNORMAL HIGH (ref 0–149)
VLDL Cholesterol Cal: 45 mg/dL — ABNORMAL HIGH (ref 5–40)

## 2021-11-25 ENCOUNTER — Other Ambulatory Visit: Payer: No Typology Code available for payment source

## 2021-11-25 ENCOUNTER — Other Ambulatory Visit (HOSPITAL_COMMUNITY): Payer: Self-pay

## 2021-11-25 DIAGNOSIS — Z1211 Encounter for screening for malignant neoplasm of colon: Secondary | ICD-10-CM

## 2021-11-25 LAB — MICROALBUMIN / CREATININE URINE RATIO
Creatinine, Urine: 189.4 mg/dL
Microalb/Creat Ratio: 7 mg/g creat (ref 0–29)
Microalbumin, Urine: 14.1 ug/mL

## 2021-11-26 ENCOUNTER — Encounter: Payer: Self-pay | Admitting: Internal Medicine

## 2021-11-26 NOTE — Assessment & Plan Note (Signed)
Well controlled HTN: - metoprolol 25 mg daily, lisinopril 10 mg daily and no side effects   BP Readings from Last 3 Encounters:  11/23/21 121/75  07/11/21 115/80  06/22/21 (!) 148/83

## 2021-11-26 NOTE — Assessment & Plan Note (Addendum)
DM: - metformin 6546 mg BID, trulicity 4.5 mg weekly, lantus 10 units qhs - Denies medication side effects - Eye exam (12/2020) - retinopathy present  - Foot exam (11/2021)   Hemoglobin A1C Latest Ref Rng & Units 06/27/2021 03/22/2021 12/22/2020  HGBA1C 4.0 - 5.6 % 7.3(A) 8.3(A) 8.9(A)  Some recent data might be hidden   Plan: - A1c at 7.8 will increase lantus to 15 units.

## 2021-11-26 NOTE — Assessment & Plan Note (Signed)
Flu shot given FOBT kit given

## 2021-11-26 NOTE — Assessment & Plan Note (Signed)
HLD: - atorvastatin 40 mg, no side effects.   Lipid Panel     Component Value Date/Time   CHOL 191 02/23/2016 1414   TRIG 124 02/23/2016 1414   HDL 55 02/23/2016 1414   CHOLHDL 3.5 02/23/2016 1414   CHOLHDL 3.5 03/09/2015 1551   VLDL 67 (H) 03/09/2015 1551   LDLCALC 111 (H) 02/23/2016 1414   LABVLDL 25 02/23/2016 1414

## 2021-11-26 NOTE — Assessment & Plan Note (Signed)
Patient's lasix was discontinued. She will need to be revaluated for hypercalcemia at her next visit.

## 2021-11-27 LAB — FECAL OCCULT BLOOD, IMMUNOCHEMICAL: Fecal Occult Bld: NEGATIVE

## 2021-11-28 ENCOUNTER — Other Ambulatory Visit (HOSPITAL_COMMUNITY): Payer: Self-pay

## 2021-11-28 NOTE — Progress Notes (Signed)
Internal Medicine Clinic Attending  Case discussed with Dr. Coe  At the time of the visit.  We reviewed the resident's history and exam and pertinent patient test results.  I agree with the assessment, diagnosis, and plan of care documented in the resident's note.  

## 2021-12-20 LAB — HM DIABETES EYE EXAM

## 2021-12-26 ENCOUNTER — Other Ambulatory Visit (HOSPITAL_COMMUNITY): Payer: Self-pay

## 2021-12-27 ENCOUNTER — Other Ambulatory Visit: Payer: Self-pay | Admitting: Internal Medicine

## 2021-12-27 ENCOUNTER — Other Ambulatory Visit (HOSPITAL_COMMUNITY): Payer: Self-pay

## 2021-12-27 ENCOUNTER — Telehealth: Payer: Self-pay | Admitting: Dietician

## 2021-12-27 DIAGNOSIS — E119 Type 2 diabetes mellitus without complications: Secondary | ICD-10-CM

## 2021-12-27 DIAGNOSIS — Z794 Long term (current) use of insulin: Secondary | ICD-10-CM

## 2021-12-27 NOTE — Telephone Encounter (Signed)
Called to schedule a follow up visit per patient request. Left voicemail for return call

## 2021-12-28 ENCOUNTER — Other Ambulatory Visit (HOSPITAL_COMMUNITY): Payer: Self-pay

## 2021-12-28 MED ORDER — TRULICITY 4.5 MG/0.5ML ~~LOC~~ SOAJ
4.5000 mg | SUBCUTANEOUS | 2 refills | Status: DC
  Filled 2021-12-28: qty 2, 28d supply, fill #0
  Filled 2022-01-25: qty 2, 28d supply, fill #1
  Filled 2022-02-22: qty 2, 28d supply, fill #2

## 2021-12-28 NOTE — Telephone Encounter (Signed)
Referral placed.

## 2021-12-28 NOTE — Addendum Note (Signed)
Addended by: Jodean Lima on: 12/28/2021 09:00 AM   Modules accepted: Orders

## 2021-12-28 NOTE — Telephone Encounter (Signed)
Approved trulicity refill. Please have patient scheduled follow up for DM with me in March. Thanks.

## 2021-12-30 ENCOUNTER — Other Ambulatory Visit (HOSPITAL_COMMUNITY): Payer: Self-pay

## 2021-12-30 MED ORDER — OFLOXACIN 0.3 % OP SOLN
1.0000 [drp] | Freq: Four times a day (QID) | OPHTHALMIC | 1 refills | Status: DC
  Filled 2021-12-30: qty 5, 25d supply, fill #0
  Filled 2022-01-25: qty 5, 25d supply, fill #1

## 2022-01-05 ENCOUNTER — Encounter: Payer: No Typology Code available for payment source | Admitting: Dietician

## 2022-01-05 ENCOUNTER — Other Ambulatory Visit (HOSPITAL_COMMUNITY): Payer: Self-pay

## 2022-01-09 ENCOUNTER — Other Ambulatory Visit (HOSPITAL_COMMUNITY): Payer: Self-pay

## 2022-01-25 ENCOUNTER — Other Ambulatory Visit (HOSPITAL_COMMUNITY): Payer: Self-pay

## 2022-01-25 ENCOUNTER — Other Ambulatory Visit: Payer: Self-pay | Admitting: Internal Medicine

## 2022-01-25 DIAGNOSIS — I10 Essential (primary) hypertension: Secondary | ICD-10-CM

## 2022-01-25 MED ORDER — METOPROLOL SUCCINATE ER 25 MG PO TB24
25.0000 mg | ORAL_TABLET | Freq: Every day | ORAL | 3 refills | Status: DC
  Filled 2022-01-25: qty 90, 90d supply, fill #0
  Filled 2022-04-17: qty 90, 90d supply, fill #1
  Filled 2022-07-13: qty 90, 90d supply, fill #2
  Filled 2022-10-18: qty 90, 90d supply, fill #3

## 2022-02-22 ENCOUNTER — Encounter: Payer: No Typology Code available for payment source | Admitting: Dietician

## 2022-02-22 ENCOUNTER — Encounter: Payer: No Typology Code available for payment source | Admitting: Internal Medicine

## 2022-02-22 ENCOUNTER — Other Ambulatory Visit (HOSPITAL_COMMUNITY): Payer: Self-pay

## 2022-03-09 ENCOUNTER — Other Ambulatory Visit (HOSPITAL_COMMUNITY): Payer: Self-pay

## 2022-03-24 ENCOUNTER — Encounter: Payer: Self-pay | Admitting: *Deleted

## 2022-03-24 DIAGNOSIS — Z006 Encounter for examination for normal comparison and control in clinical research program: Secondary | ICD-10-CM

## 2022-03-24 NOTE — Research (Signed)
Spoke with Michele Andrews about essence research. She has requested more information. Email sent with a copy of the consent for her to review. ?

## 2022-03-27 ENCOUNTER — Other Ambulatory Visit: Payer: Self-pay | Admitting: Internal Medicine

## 2022-03-27 ENCOUNTER — Other Ambulatory Visit (HOSPITAL_COMMUNITY): Payer: Self-pay

## 2022-03-27 DIAGNOSIS — E119 Type 2 diabetes mellitus without complications: Secondary | ICD-10-CM

## 2022-03-27 MED ORDER — TRULICITY 4.5 MG/0.5ML ~~LOC~~ SOAJ
4.5000 mg | SUBCUTANEOUS | 2 refills | Status: DC
  Filled 2022-03-27: qty 2, 28d supply, fill #0
  Filled 2022-04-17: qty 2, 28d supply, fill #1

## 2022-03-27 NOTE — Telephone Encounter (Signed)
Approved short term trulicity refill. Please have patient schedule DM follow up with me.  ?

## 2022-03-28 ENCOUNTER — Other Ambulatory Visit (HOSPITAL_COMMUNITY): Payer: Self-pay

## 2022-04-03 ENCOUNTER — Encounter: Payer: Self-pay | Admitting: *Deleted

## 2022-04-03 DIAGNOSIS — Z006 Encounter for examination for normal comparison and control in clinical research program: Secondary | ICD-10-CM

## 2022-04-03 NOTE — Research (Signed)
Message left for Michele Andrews  to see if she had any questions about [name]. Encourages her to call back.  ?

## 2022-04-04 ENCOUNTER — Encounter: Payer: Self-pay | Admitting: *Deleted

## 2022-04-04 DIAGNOSIS — Z006 Encounter for examination for normal comparison and control in clinical research program: Secondary | ICD-10-CM

## 2022-04-04 NOTE — Research (Signed)
Message left for Michele Andrews to call back about scheduling an appointment for essence. She called stating she was interested earlier.   ?

## 2022-04-10 ENCOUNTER — Other Ambulatory Visit (HOSPITAL_COMMUNITY): Payer: Self-pay

## 2022-04-10 ENCOUNTER — Other Ambulatory Visit: Payer: Self-pay | Admitting: Internal Medicine

## 2022-04-10 DIAGNOSIS — E119 Type 2 diabetes mellitus without complications: Secondary | ICD-10-CM

## 2022-04-11 ENCOUNTER — Other Ambulatory Visit (HOSPITAL_COMMUNITY): Payer: Self-pay

## 2022-04-11 MED ORDER — INSULIN GLARGINE-YFGN 100 UNIT/ML ~~LOC~~ SOPN
15.0000 [IU] | PEN_INJECTOR | Freq: Every day | SUBCUTANEOUS | 2 refills | Status: DC
  Filled 2022-04-11: qty 6, 40d supply, fill #0
  Filled 2022-05-18: qty 6, 40d supply, fill #1
  Filled 2022-06-26: qty 6, 40d supply, fill #2

## 2022-04-17 ENCOUNTER — Other Ambulatory Visit (HOSPITAL_COMMUNITY): Payer: Self-pay

## 2022-04-18 ENCOUNTER — Other Ambulatory Visit (HOSPITAL_COMMUNITY): Payer: Self-pay

## 2022-04-21 ENCOUNTER — Other Ambulatory Visit (HOSPITAL_COMMUNITY): Payer: Self-pay

## 2022-04-24 ENCOUNTER — Encounter: Payer: Self-pay | Admitting: *Deleted

## 2022-04-24 DIAGNOSIS — Z006 Encounter for examination for normal comparison and control in clinical research program: Secondary | ICD-10-CM

## 2022-04-24 NOTE — Research (Signed)
Spoke with Ms Schiano about coming in for [name] screening. Scheduled for May 25 at 0800 ?

## 2022-04-26 ENCOUNTER — Encounter: Payer: Self-pay | Admitting: Internal Medicine

## 2022-04-26 ENCOUNTER — Other Ambulatory Visit: Payer: Self-pay

## 2022-04-26 ENCOUNTER — Ambulatory Visit (INDEPENDENT_AMBULATORY_CARE_PROVIDER_SITE_OTHER): Payer: No Typology Code available for payment source | Admitting: Internal Medicine

## 2022-04-26 ENCOUNTER — Other Ambulatory Visit (HOSPITAL_COMMUNITY): Payer: Self-pay

## 2022-04-26 VITALS — BP 147/82 | HR 93 | Ht 64.0 in | Wt 161.6 lb

## 2022-04-26 DIAGNOSIS — N390 Urinary tract infection, site not specified: Secondary | ICD-10-CM | POA: Insufficient documentation

## 2022-04-26 DIAGNOSIS — N3001 Acute cystitis with hematuria: Secondary | ICD-10-CM

## 2022-04-26 MED ORDER — CEFDINIR 300 MG PO CAPS
300.0000 mg | ORAL_CAPSULE | Freq: Two times a day (BID) | ORAL | 0 refills | Status: DC
  Filled 2022-04-26: qty 10, 5d supply, fill #0

## 2022-04-26 NOTE — Assessment & Plan Note (Addendum)
She is presenting for 2-day history of urinary symptoms.  Symptoms include suprapubic discomfort, urinary frequency, and sense of incomplete bladder emptying.  Denies fever, chills, nausea, vomiting, or back pain.  Reports similar symptoms with prior UTIs in the past. - Check UA with microscopy - Cefdinir 300 mg BID x5d  - Return precautions discussed.  She will follow-up with Dr. Philipp Ovens for her chronic medical conditions at the end of the month.  Addendum #1 04/27/22 5:05 PM  UA consistent with UTI. Microscopic hematuria present and likely attributable to UTI however follow up UA in 4-6w is recommended to ensure resolution of hematuria. These results and follow up recommendations were relayed to the patient.

## 2022-04-26 NOTE — Progress Notes (Signed)
? ?  Office Visit ? ? Patient ID: Michele Andrews, female    DOB: Jul 11, 1951, 71 y.o.   MRN: [id number]   PCP: Velna Ochs, MD  ? ?Subjective:  ?CC: Follow-up (? UTI X 2 DAYS .)  ? ?Michele Andrews is a 71 y.o. year old female who presents for the above medical condition(s). Please refer to problem based charting for assessment and plan. ? ?Objective:  ? ?BP (!) 147/82 (BP Location: Right Arm, Patient Position: Sitting, Cuff Size: Normal)   Pulse 93   Ht '5\' 4"'$  (1.626 m)   Wt 161 lb 9.6 oz (73.3 kg)   SpO2 100%   BMI 27.74 kg/m?  ? ?General: Well-appearing female in no acute distress, nontoxic-appearing ?Cardiac: Heart regular rate and rhythm ?Pulm: Lungs clear ?GI: Abdomen soft, nondistended.  No suprapubic pain on palpation.  No CVA tenderness ?Assessment & Plan:  ? ?Problem List Items Addressed This Visit   ? ?  ? Genitourinary  ? UTI (urinary tract infection) - Primary  ?  She is presenting for 2-day history of urinary symptoms.  Symptoms include suprapubic discomfort, urinary frequency, and sense of incomplete bladder emptying.  Denies fever, chills, nausea, vomiting, or back pain.  Reports similar symptoms with prior UTIs in the past. ?- Check UA with microscopy ?- Cefdinir 300 mg BID x5d  ?- Return precautions discussed.  She will follow-up with Dr. Philipp Ovens for her chronic medical conditions at the end of the month. ? ?  ?  ? Relevant Orders  ? Urinalysis, Complete (81001)  ? ? ? ?Return if symptoms worsen or fail to improve. ? ? ?Pt discussed with Dr. Heber [place] ? ?Mitzi Hansen, MD ?Internal Medicine Resident PGY-3 ?[facility] ?04/26/2022 9:21 AM  ?  ?

## 2022-04-27 LAB — MICROSCOPIC EXAMINATION: Casts: NONE SEEN /lpf

## 2022-04-27 LAB — URINALYSIS, COMPLETE
Bilirubin, UA: NEGATIVE
Glucose, UA: NEGATIVE
Ketones, UA: NEGATIVE
Nitrite, UA: NEGATIVE
Specific Gravity, UA: 1.005 (ref 1.005–1.030)
Urobilinogen, Ur: 0.2 mg/dL (ref 0.2–1.0)
pH, UA: 6 (ref 5.0–7.5)

## 2022-04-27 NOTE — Progress Notes (Signed)
Results and follow up recommendations called to pt. Consider repeat UA in 4-6 weeks to ensure resolution of hematuria.

## 2022-05-02 NOTE — Progress Notes (Signed)
Internal Medicine Clinic Attending  Case discussed with the resident at the time of the visit.  We reviewed the resident's history and exam and pertinent patient test results.  I agree with the assessment, diagnosis, and plan of care documented in the resident's note.  

## 2022-05-03 ENCOUNTER — Encounter: Payer: No Typology Code available for payment source | Admitting: Internal Medicine

## 2022-05-03 ENCOUNTER — Encounter: Payer: Self-pay | Admitting: *Deleted

## 2022-05-03 DIAGNOSIS — Z006 Encounter for examination for normal comparison and control in clinical research program: Secondary | ICD-10-CM

## 2022-05-03 NOTE — Research (Signed)
Spoke with Michele Andrews to remind her of her  essence research appointment tomorrow at 0800. Voices understanding.

## 2022-05-04 ENCOUNTER — Encounter: Payer: No Typology Code available for payment source | Admitting: *Deleted

## 2022-05-04 ENCOUNTER — Other Ambulatory Visit: Payer: Self-pay

## 2022-05-04 VITALS — BP 143/83 | HR 96 | Temp 97.5°F | Resp 18 | Ht 64.5 in | Wt 157.0 lb

## 2022-05-04 DIAGNOSIS — Z006 Encounter for examination for normal comparison and control in clinical research program: Secondary | ICD-10-CM

## 2022-05-04 NOTE — Progress Notes (Signed)
Patient seen and evaluated for enrollment in [name].  She is a wonderful 71 year old female, 48 year employee of [employer], who is followed in the internal medicine residents clinic.  She is between providers after residency graduation and will see a new one on Wednesday 31st.  Denies chest pain or cardiac symptoms. Triglcerides have been in alignment with diabetes, with normal previously but elevated more recently.  She meets inclusion and exclusion criteria for [name] study, and this was explained in great detail today, including the goals of the study, the drug, what is known regarding side effects, the nature of randomization, and the volunteer nature of all studies.  She wished to proceed, and also potentially in the substudy with CTA.  Importantly, we reviewed contrast, contrast risks, upside, downside, use of Metformin during contrast use.    Alert, oriented female in NAD BP 143/83, P96, afebrile, RR normal Lungs clear No JVD Cardiac regular without murmur, rub or gallop.  Abd soft No edema  ECG  normal  We reviewed as noted in great detail, and she will enter roll in phase.  Lipid results from past reviewed with patient.  Depending on studies, may benefit with  visit to Dr. Ernestene Kiel.

## 2022-05-04 NOTE — Research (Addendum)
[name] Consent     Subject Name: Michele Andrews  Subject met inclusion and exclusion criteria.  The informed consent form, study requirements and expectations were reviewed with the subject and questions and concerns were addressed prior to the signing of the consent form.  The subject verbalized understanding of the trial requirements.  The subject agreed to participate in the Core  trial and signed the informed consent at [place] on 04-May-2022.  The informed consent was obtained prior to performance of any protocol-specific procedures for the subject.  A copy of the signed informed consent was given to the subject and a copy was placed in the subject's medical record.   Michele Andrews  Protocol number 1 Consent version  2   [name]  [id number]    Site [phone]  SUBJECT [id number]                           DATE:   04-May-2022       _0  FEMALE                            _1  FEMALE AGE: ETHINICITY:   _2  HISPANIC/LATINO      _3  NON- HISPANIC/LATINO RACE:           _4   WHITE             _5  BLACK/AFRICAN AMERICAN                         _6   ASIAN              _7  AMERICAN INDIAN/[place] NATIVE                         _8   NATIVE [place]/OTHER [place]IFIC ISLANDER                         _9   OTHER  FUTURE RESEARCH _10  USE OF SAMPLES FOR FUTURE RESEARCH _11  Consented for Sub study CTA   INCLUSION CRITERIA Consent      _12    Pregnancy authorization _13   AGE 38 or greater _14   Triglycerides fasting 150 or greater with either: _15   Dx of ASCVD (CAD, CVA, PAD) OR _16   Increased risk for ASCVD as below _17   Type 2 DM OR 2 or more below _18   Men 73 or greater Woman 5 or greater _19  _20    Woman with Hx of preeclampsia or premature menopause (before 68) _21    Family Hx of premature ASCDD (Before 35 for males, or before 27 for females  _22    Current Tobacco use  <QMVHQIONGEXBMWUX>_3<\/KGMWNUUVOZDGUYQI>_34    Metabolic syndrome <VQQVZDGLOVFIEPPI>_9<\/JJOACZYSAYTKZSWF>_09    Hypertension with Treatment  _25    CKD stage 3 Or (GFR 30-59)  _26    LDL-C 160 or greater LDL-C  100 or greater on therapy to lower  _27    Elevated high-sensitivity C Reactive protein (>2.0)  _28   Elevated lipoprotein (a) (>98m/dL or 124nmol/L) OR  _29   Triglycerides fasting 500 or greater  _30   Lipid-lowering med (for at least 4 weeks) [place] to comply with diet and lifestyle recommendations  _31   Females must be non-pregnant and non-lactating and EITHER  _32   Surgically sterile, post-menopausal, abstinent OR Use highly effective contraceptive at time of consent until at least 30 weeks after last dose of study drug  _33   Males  must be surgical sterile, abstinent or using a highly effective contraceptive at time of consent until at least 30 weeks after the last dose of study drug   _0     EXCLUSION CRITERIA           N/A                                            _1  Major surgery, peripheral revascularization, or non-urgent PCI within 3 months prior to screening, or planned major surgery or major procedure during the study _2   Active pancreatitis within 4 weeks prior to screening _3   Acute coronary syndrome or CVA/TIA within 3 months of screening _4   Screening labs: ALT or AST >3.0 x ULN Total bilirubin >1.5 ULN unless due to Gilbert's syndrome GFR >30 Urine Protein/creatine ratio >500 Uncontrolled HTN (BP>180/100 despite Treatment Uncontrolled hypothyroidism TSH>1.5 and T4 < LLN, or Hormone therapy not stable for 4 weeks or greater  _5  _6  _7  _8  _9  _10   DM newly dx within 12 weeks of screening A1c > 9.5 at screening  _11  _12   Change in basal insulin >20% within 3 months prior to screening  _13   Type 1 Dm: episode of DKA or > 3 episodes of severe hypo glycerides with on 6 months prior to screening   Active infections, HIV, Hep C, Hep B _14   Active infection requiring systemic antiviral or antimicrobial tx that will not be complete prior to study day 1 or active Covid 19 infection not resolved by study day 1 _15   Malignancy within 5 years (except for non-melanoma skin ca,  cervical in situ ca, breast ductal ca in situ or stage 1 prostate Ca that has been tx _16   Hypersensitivity to the active substance (olezarsen or placebo) _17   Tx with another investigational drug or devise within 1 month or screening  _18   Previous tx with an oligonucleotide within 4 months of screening _19   Con meds/ procedure restrictions:   Systemic corticosteroids of anabolic steroids within 6 weeks prior to screening and during the study unless approved   _20   Use of bile acids resins (colestipol or Colesevelam) within 4 weeks prior to screening or planned during the study  _21   Plasma apheresis within 4 weeks prior to screening or planned during the study  _22   Change in meds known to exacerbate hypertriglyceridemia (beta blockers, thiazides, isotretinoin, oral antidiabetic meds, tamoxifen, estrogens or progestins within 4 weeks prior to screening  _23    Change or expected need for significant change in titration of therapies known to significantly reduce TG (GLP-1 agonists, other incretin mimetics, Phentermine/topiramate, naltrexone/bupropion, Xenical, or bariatric surgery within 3 months prior to screening  _24    Change in antipsychotic meds within 30 days of screening or >434m within 60 days of Screening  _25    Blood or plasma donation of 50-4927mwithin 30 days of screening or>499 within 60 days of screening _26   Unwilling to comply with procedures, following up, or unwillingness to cooperate fully with the investigator _27   ETOH abuse or recent (<1 year) or other substance abuse _28       Ms MaDankerere for run in visit for [name]. She reports no abd pain, or other pain. No visits to the ED or urgent care. Provided time to ask questions before signing consent. VS taken at 0809, Blood drawn at 0820, and EKG at 0901. Dr StLia Foyer  in to see patient. He reviewed and signed EKG. Meds reviewed.  Ms Smyre states that she no longer uses Omnicef, Ocuflox eye drops, Nasalcrom, Pred Forte eye drops,  or Prolensa eye drops. Urine obtained at 1210.

## 2022-05-05 ENCOUNTER — Other Ambulatory Visit (HOSPITAL_COMMUNITY): Payer: Self-pay

## 2022-05-05 ENCOUNTER — Other Ambulatory Visit: Payer: No Typology Code available for payment source

## 2022-05-05 ENCOUNTER — Telehealth: Payer: Self-pay | Admitting: *Deleted

## 2022-05-05 DIAGNOSIS — N3001 Acute cystitis with hematuria: Secondary | ICD-10-CM

## 2022-05-05 NOTE — Telephone Encounter (Signed)
Patient called in stating she needs "a stronger antibiotic" for UTI. C/o urinary urgency, "pressure at night," and low pelvic pain. Discussed with PCP and patient will come to [facility] for lab only visit for repeat u/a and cx.

## 2022-05-05 NOTE — Addendum Note (Signed)
Addended by: Despina Hidden C on: 05/05/2022 12:00 PM   Modules accepted: Orders

## 2022-05-05 NOTE — Research (Signed)
Ms Bergevin states that she takes: Tylenol  as needed for pain started 11-08-15 Lipitor for hyperlipidemia started 11-15-16 Vit D Supplement started 89-21-19 Trulicity for Type 2 Dm started 01-27-21 Flonase for allergies started 04-18-19 Isopto tears for dry eyes started 08-28-18 Advil as needed for neck pain started 02-07-11 Semglee insulin for Type 2 Dm started 06-09-21 Zestril for essential hypertension started 02-07-11 Metformin for type 2 Dm started 02-07-11 Toprol-xl for essential hypertension started 09-29-15

## 2022-05-05 NOTE — Telephone Encounter (Signed)
Placed order for repeat UA / urine culture and BMP if able to assess renal function.

## 2022-05-06 LAB — URINALYSIS, COMPLETE
Bilirubin, UA: NEGATIVE
Glucose, UA: NEGATIVE
Nitrite, UA: NEGATIVE
Specific Gravity, UA: 1.023 (ref 1.005–1.030)
Urobilinogen, Ur: 0.2 mg/dL (ref 0.2–1.0)
pH, UA: 5.5 (ref 5.0–7.5)

## 2022-05-06 LAB — MICROSCOPIC EXAMINATION
Casts: NONE SEEN /lpf
RBC, Urine: >30 /hpf — AB (ref 0–2)
WBC, UA: >30 /hpf — AB (ref 0–5)

## 2022-05-09 ENCOUNTER — Encounter: Payer: Self-pay | Admitting: *Deleted

## 2022-05-09 ENCOUNTER — Telehealth: Payer: Self-pay | Admitting: *Deleted

## 2022-05-09 DIAGNOSIS — Z006 Encounter for examination for normal comparison and control in clinical research program: Secondary | ICD-10-CM

## 2022-05-09 NOTE — Telephone Encounter (Signed)
Pt called / informed her doctor is waiting on final results and will call her when available. Pt has an appt tomorrow @ 1015 AM.

## 2022-05-09 NOTE — Research (Signed)
Message left for Ms Lajeunesse to inform her she will be a screen fail for [name] research . Encouraged her to call with any questions.

## 2022-05-09 NOTE — Research (Addendum)
Rich Number  [name] screening run in visit 25-May 2023                  [name] Lab report Month Day, Year   Chemistry:  Glucose 121    mg/dL             '[]'$ Clinically Significant  '[x]'$ Not Clinically Significant Total Protein 8.7  g/dL            '[]'$ Clinically Significant  '[x]'$ Not Clinically Significant Magnesium 1.7 mg/dL            '[]'$ Clinically Significant  '[x]'$ Not Clinically Significant Hemoglobin A1c 7.9 %           '[]'$ Clinically Significant  '[x]'$ Not Clinically Significant   Urinalysis: Protein 10 mg/dL                           '[]'$ Clinically Significant  '[x]'$ Not Clinically Significant Leukocyte Esterase 500                 '[]'$ Clinically Significant  '[x]'$ Not Clinically Significant Urinary White Blood cells  16-29    '[]'$ Clinically Significant  '[x]'$ Not Clinically Significant Squamous Epithelial Cells  16-29   '[]'$ Clinically Significant  '[x]'$ Not Clinically Significant Transitional Epithelial cells  Occ     '[]'$ Clinically Significant  '[x]'$ Not Clinically Significant Urine Chemistry: Mucus   1+                                       '[]'$ Clinically Significant  '[x]'$ Not Clinically Significant   Lipids:  Triglyceride    92  mg/dL    Non-HDL Cholesterol  72 mg/dL       '[]'$ Clinically Significant  '[x]'$ Not Clinically Significant Lipoprotein (a) Molar   365.1             '[x]'$ Clinically Significant  '[]'$ Not Clinically Significant  Any further action needed to be taken per the PI?  No  Urine appears contaminated. Trigs do not appear to meet criteria.  However, LP(a) is very high - consider OCEAN A  Pixie Casino, MD, [facility], [place] Director of the Advanced Lipid Disorders &  Cardiovascular Risk Reduction Clinic Diplomate of the American Board of Clinical Lipidology Attending Cardiologist  Direct Dial: [phone]  Fax: [phone]  Website:  [email or web address]

## 2022-05-09 NOTE — Telephone Encounter (Signed)
Call from pt who stated she dropped off an urine specimen on Friday and has not heard from the doctor. And she had a bad w/e and drank cherry tart which helped but is aware it might increase her BS. Informed pt I will send her concerns to her doctor. She has an appt to see her PCP tomorrow.

## 2022-05-09 NOTE — Telephone Encounter (Signed)
Please let her know I am still waiting on the final results. I should have them today or tomorrow and will give her a call. Thanks.

## 2022-05-10 ENCOUNTER — Ambulatory Visit (INDEPENDENT_AMBULATORY_CARE_PROVIDER_SITE_OTHER): Payer: No Typology Code available for payment source | Admitting: Internal Medicine

## 2022-05-10 ENCOUNTER — Other Ambulatory Visit: Payer: Self-pay

## 2022-05-10 ENCOUNTER — Encounter: Payer: Self-pay | Admitting: Internal Medicine

## 2022-05-10 ENCOUNTER — Other Ambulatory Visit (HOSPITAL_COMMUNITY): Payer: Self-pay

## 2022-05-10 VITALS — BP 142/84 | HR 96 | Temp 98.4°F | Wt 159.0 lb

## 2022-05-10 DIAGNOSIS — N3001 Acute cystitis with hematuria: Secondary | ICD-10-CM | POA: Diagnosis not present

## 2022-05-10 DIAGNOSIS — Z794 Long term (current) use of insulin: Secondary | ICD-10-CM

## 2022-05-10 DIAGNOSIS — E785 Hyperlipidemia, unspecified: Secondary | ICD-10-CM

## 2022-05-10 DIAGNOSIS — I10 Essential (primary) hypertension: Secondary | ICD-10-CM

## 2022-05-10 DIAGNOSIS — E119 Type 2 diabetes mellitus without complications: Secondary | ICD-10-CM | POA: Diagnosis not present

## 2022-05-10 LAB — GLUCOSE, CAPILLARY: Glucose-Capillary: 128 mg/dL — ABNORMAL HIGH (ref 70–99)

## 2022-05-10 LAB — POCT GLYCOSYLATED HEMOGLOBIN (HGB A1C): Hemoglobin A1C: 7.4 % — AB (ref 4.0–5.6)

## 2022-05-10 LAB — URINE CULTURE

## 2022-05-10 MED ORDER — NITROFURANTOIN MONOHYD MACRO 100 MG PO CAPS
100.0000 mg | ORAL_CAPSULE | Freq: Two times a day (BID) | ORAL | 0 refills | Status: DC
Start: 2022-05-10 — End: 2022-11-23
  Filled 2022-05-10: qty 10, 5d supply, fill #0

## 2022-05-10 MED ORDER — TRULICITY 4.5 MG/0.5ML ~~LOC~~ SOAJ
4.5000 mg | SUBCUTANEOUS | 2 refills | Status: DC
  Filled 2022-05-10: qty 2, 28d supply, fill #0
  Filled 2022-06-19: qty 2, 28d supply, fill #1
  Filled 2022-07-14: qty 2, 28d supply, fill #2

## 2022-05-10 NOTE — Progress Notes (Signed)
Subjective:   Patient ID: Michele Andrews female   DOB: Mar 17, 1951 71 y.o.   MRN: [id number]  HPI: [name] is a 71 y.o. female with past medical history outlined below here for UTI follow up. For the details of today's visit, please refer to the assessment and plan.   Past Medical History:  Diagnosis Date   Carpal tunnel syndrome    Diabetes mellitus    Type II- diagnosed 30 yrs. ago   Family history of adverse reaction to anesthesia    20 YRS. AGO, HER 19 YR. OLD NEPHEW, ENDED UP ON VENTILATOR- PT. THINKS ITS BECAUSE HE ASPIRATED    Hypercalcemia    Hyperlipidemia    Hypertension    Osteopenia    Rotator cuff syndrome of right shoulder    Current Outpatient Medications  Medication Sig Dispense Refill   nitrofurantoin, macrocrystal-monohydrate, (MACROBID) 100 MG capsule Take 1 capsule (100 mg total) by mouth 2 (two) times daily for 5 days 10 capsule 0   acetaminophen (TYLENOL) 650 MG CR tablet Take 650 mg by mouth every 8 (eight) hours as needed for pain.     atorvastatin (LIPITOR) 40 MG tablet Take 1 tablet (40 mg total) by mouth daily. 30 tablet 11   Blood Glucose Monitoring Suppl (FREESTYLE FREEDOM LITE) w/Device KIT 1 each by Does not apply route 3 (three) times daily before meals. 1 kit 0   Blood Glucose Monitoring Suppl (TRUETRACK BLOOD GLUCOSE) W/DEVICE KIT Use to check blood sugar 2 times a day      cefdinir (OMNICEF) 300 MG capsule Take 1 capsule (300 mg total) by mouth 2 (two) times daily. (Patient not taking: Reported on 05/04/2022) 10 capsule 0   Cholecalciferol (VITAMIN D3) 2000 units TABS Take 2,000 Units by mouth daily.     COVID-19 At Home Antigen Test [facility] COVID-19 HOME TEST) KIT Use as directed 4 each 0   cromolyn (NASALCROM) 5.2 MG/ACT nasal spray Place 1 spray into both nostrils 3 (three) times daily for 14 days. 26 mL 0   Dulaglutide (TRULICITY) 4.5 ZE/0.9QZ SOPN Inject 4.5 mg as directed once a week. 2 mL 2   fluticasone (FLONASE) 50 MCG/ACT  nasal spray Place into the nose.     glucose blood (FREESTYLE LITE) test strip Use to check blood sugar 2 times a day. 100 each 12   hydroxypropyl methylcellulose / hypromellose (ISOPTO TEARS / GONIOVISC) 2.5 % ophthalmic solution Place 1-2 drops into both eyes 3 (three) times daily as needed for dry eyes.     ibuprofen (ADVIL) 800 MG tablet Take 1 tablet (800 mg total) by mouth every 8 (eight) hours as needed for pain 30 tablet 1   insulin glargine-yfgn (SEMGLEE, YFGN,) 100 UNIT/ML Pen Inject 15 Units into the skin at bedtime. 6 mL 2   Lancets (FREESTYLE) lancets Use 2 times daily to check blood sugar 300 each 1   lisinopril (ZESTRIL) 10 MG tablet Take 1 tablet (10 mg total) by mouth daily. 90 tablet 3   metFORMIN (GLUCOPHAGE) 1000 MG tablet Take 1 tablet (1,000 mg total) by mouth 2 (two) times daily with a meal. 180 tablet 3   metoprolol succinate (TOPROL-XL) 25 MG 24 hr tablet Take 1 tablet (25 mg total) by mouth daily before breakfast. 90 tablet 3   ofloxacin (OCUFLOX) 0.3 % ophthalmic solution Instill 1 drop into right eye four times a day starting 1 day before surgery (Patient not taking: Reported on 05/04/2022) 5 mL 1   ofloxacin (OCUFLOX)  0.3 % ophthalmic solution Place 1 drop into the right eye 4 (four) times daily starting 1 day before surgery (Patient not taking: Reported on 05/04/2022) 5 mL 1   prednisoLONE acetate (PRED FORTE) 1 % ophthalmic suspension Instill 1 drop into right eye four times a day after surgery (Patient not taking: Reported on 05/04/2022) 10 mL 1   PROLENSA 0.07 % SOLN Instill 1 drop into right eye once a day starting one day before surgery (Patient not taking: Reported on 05/04/2022) 3 mL 1   No current facility-administered medications for this visit.   Family History  Problem Relation Age of Onset   Hypertension Mother    Cancer Mother    Breast cancer Mother 45   Breast cancer Maternal Aunt 39   Breast cancer Cousin 82   Social History   Socioeconomic History    Marital status: Married    Spouse name: Not on file   Number of children: Not on file   Years of education: 12   Highest education level: Not on file  Occupational History   Occupation: Research scientist (life sciences): [employer]  Tobacco Use   Smoking status: Never   Smokeless tobacco: Never  Substance and Sexual Activity   Alcohol use: No   Drug use: No   Sexual activity: Not on file  Other Topics Concern   Not on file  Social History Narrative   Not on file   Social Determinants of Health   Financial Resource Strain: Not on file  Food Insecurity: Not on file  Transportation Needs: Not on file  Physical Activity: Not on file  Stress: Not on file  Social Connections: Not on file    Objective:  Physical Exam:  Vitals:   05/10/22 1028 05/10/22 1050  BP: (!) 150/85 (!) 142/84  Pulse: 90 96  Temp: 98.4 F (36.9 C)   TempSrc: Oral   SpO2: 100%   Weight: 159 lb (72.1 kg)     Constitutional: Well appearing, NAD Cardiovascular: RRR, no m/r/g Pulmonary/Chest: Clear bilaterally, normal effort Extremities: warm, no edema    Assessment & Plan:   UTI (urinary tract infection) Patient called office last week to report on going symptoms of UTI despite 5 day treatment with cefdinir. UA looked consistent with infection however urine culture was not collected. I had patient come by the office on Friday for a lab only visit to repeat UA and send for UCx, which grew pan sensitive citrobacter. Given on going symptoms, will treat another 5 days with Macrobid. Patient will call if symptoms do no resolve.    Hyperlipemia Patient is taking Lipitor 40 mg daily in the setting of type II DM for primary ASCVD prevention. LDL last checked 5 months ago was not at goal. She reports compliance with lipitor and has made some positive dietary changes since then, but was in a hurry to return to work and we deferred lab work today. Will address at f/u.   Essential hypertension BP mildly elevated  today, she reports compliance with lisinopril 10 mg daily and metoprolol 25 mg daily. If persistently elevated at f/u, consider increasing lisinopril.   Diabetes mellitus (HCC) Hgb A1c today is slightly above goal at 7.4 but improved from her last check at 7.8. Overall down trending over the past year. She is taking metformin 1g BID, semglee 15 units daily, and trulicity 4.5 mg weekly. She does not have her meter today, reports fasting CBGs ~140, no episodes of hypoglycemia. Continue current  regimen, encouraged her to bring her meter to follow up in 3 months.

## 2022-05-10 NOTE — Assessment & Plan Note (Signed)
BP mildly elevated today, she reports compliance with lisinopril 10 mg daily and metoprolol 25 mg daily. If persistently elevated at f/u, consider increasing lisinopril.

## 2022-05-10 NOTE — Assessment & Plan Note (Signed)
Patient called office last week to report on going symptoms of UTI despite 5 day treatment with cefdinir. UA looked consistent with infection however urine culture was not collected. I had patient come by the office on Friday for a lab only visit to repeat UA and send for UCx, which grew pan sensitive citrobacter. Given on going symptoms, will treat another 5 days with Macrobid. Patient will call if symptoms do no resolve.

## 2022-05-10 NOTE — Assessment & Plan Note (Signed)
Patient is taking Lipitor 40 mg daily in the setting of type II DM for primary ASCVD prevention. LDL last checked 5 months ago was not at goal. She reports compliance with lipitor and has made some positive dietary changes since then, but was in a hurry to return to work and we deferred lab work today. Will address at f/u.

## 2022-05-10 NOTE — Assessment & Plan Note (Signed)
Hgb A1c today is slightly above goal at 7.4 but improved from her last check at 7.8. Overall down trending over the past year. She is taking metformin 1g BID, semglee 15 units daily, and trulicity 4.5 mg weekly. She does not have her meter today, reports fasting CBGs ~140, no episodes of hypoglycemia. Continue current regimen, encouraged her to bring her meter to follow up in 3 months.

## 2022-05-10 NOTE — Patient Instructions (Signed)
Ms. Levitz,  It was a pleasure to see you today. I have sent a new antibiotic to your pharmacy. Please take this twice a day for 5 days and give me a call if your symptoms do not improve. Continue to take your other medications as prescribed and follow up with me again in 3 months.   If you have any questions or concerns, call our clinic at [phone] or after hours call [phone] and ask for the internal medicine resident on call.   Thank you!  Dr. Darnell Level

## 2022-05-16 NOTE — Research (Signed)
Michele Andrews  Screening run in

## 2022-05-18 ENCOUNTER — Other Ambulatory Visit (HOSPITAL_COMMUNITY): Payer: Self-pay

## 2022-05-22 ENCOUNTER — Other Ambulatory Visit: Payer: Self-pay | Admitting: Internal Medicine

## 2022-05-22 DIAGNOSIS — M25512 Pain in left shoulder: Secondary | ICD-10-CM

## 2022-05-23 ENCOUNTER — Other Ambulatory Visit (HOSPITAL_COMMUNITY): Payer: Self-pay

## 2022-05-23 MED ORDER — IBUPROFEN 800 MG PO TABS
800.0000 mg | ORAL_TABLET | Freq: Three times a day (TID) | ORAL | 1 refills | Status: DC | PRN
  Filled 2022-05-23: qty 30, 10d supply, fill #0
  Filled 2022-09-27: qty 30, 10d supply, fill #1

## 2022-05-25 ENCOUNTER — Encounter: Payer: Self-pay | Admitting: Dietician

## 2022-05-26 DIAGNOSIS — Z006 Encounter for examination for normal comparison and control in clinical research program: Secondary | ICD-10-CM

## 2022-05-26 NOTE — Research (Addendum)
06/08/22 Spoke with patient regarding V1P study. Patient scheduled for screening visit 06/21/22 at 2:45 PM. Office address and parking code given. Copy of ICF e-mailed to patient for review.  Left message for patient on voicemail regarding potential eligibility in research study. Left call back number and instructions for patient to return call.

## 2022-05-29 ENCOUNTER — Other Ambulatory Visit (HOSPITAL_COMMUNITY): Payer: Self-pay

## 2022-06-16 NOTE — Addendum Note (Signed)
Addended by: Randalyn Rhea on: 06/16/2022 03:05 PM   Modules accepted: Orders

## 2022-06-19 ENCOUNTER — Other Ambulatory Visit (HOSPITAL_COMMUNITY): Payer: Self-pay

## 2022-06-20 ENCOUNTER — Other Ambulatory Visit (HOSPITAL_COMMUNITY): Payer: Self-pay

## 2022-06-20 ENCOUNTER — Other Ambulatory Visit: Payer: Self-pay | Admitting: Internal Medicine

## 2022-06-20 DIAGNOSIS — M1712 Unilateral primary osteoarthritis, left knee: Secondary | ICD-10-CM

## 2022-06-20 MED ORDER — DICLOFENAC SODIUM 1 % EX GEL
2.0000 g | Freq: Four times a day (QID) | CUTANEOUS | 0 refills | Status: DC
  Filled 2022-06-20: qty 200, 17d supply, fill #0
  Filled 2022-06-20: qty 100, 13d supply, fill #0

## 2022-06-21 ENCOUNTER — Other Ambulatory Visit: Payer: Self-pay

## 2022-06-21 ENCOUNTER — Encounter: Payer: No Typology Code available for payment source | Admitting: *Deleted

## 2022-06-21 ENCOUNTER — Other Ambulatory Visit: Payer: Self-pay | Admitting: Family Medicine

## 2022-06-21 VITALS — BP 149/79 | HR 84 | Ht 63.0 in | Wt 159.0 lb

## 2022-06-21 DIAGNOSIS — Z1231 Encounter for screening mammogram for malignant neoplasm of breast: Secondary | ICD-10-CM

## 2022-06-21 DIAGNOSIS — Z006 Encounter for examination for normal comparison and control in clinical research program: Secondary | ICD-10-CM

## 2022-06-21 NOTE — Research (Signed)
V1P Informed Consent  Protocol No. [id number] ICF Version number: 00.00.00  Subject Name: Michele Andrews MRN: [id number] DOB: 1951/11/18   Subject met inclusion and exclusion criteria.  The informed consent form, study requirements and expectations were reviewed with the subject and questions and concerns were addressed prior to the signing of the consent form.  The subject verbalized understanding of the trial requirements.  The subject agreed to participate in the Victorion 1 Prevent trial and signed the informed consent on July 12th, 2023 at 1449.  Informed consent was obtained prior to performance of any protocol-specific procedures. A copy of the signed informed consent was given to the subject and original copy was placed in the subject's medical record.    VP1 Screening Visit   Michele Andrews  1951-05-11 [id number]  Subject number: [id number] Date: 06/21/2022 Informed consent signed [x]  ICF signed on 06/21/2022 at 1449  Inclusion and exclusion criteria reviewed [x] Inclusion Criteria Informed Consent Signed  [x]  Female 54-26 years of age Female 67-4 years of age [] [x]  At increased risk for first MACE by one of the following:   a.) Coronary artery calcium (CAC) score ?100 Agatstin units []  b.) Coronary artery stenosis ?20% but <50% in left main coronary artery or ? 20% but <70% in any major epicardial coronary artery []  c.) High 10-year ASCVD risk ? 20% []  d.) Intermediate 10-year ASCVD risk 7.5% - <20% with at least 2 of the following risk-enhancing factors [x]  1. A first degree relative with history of premature ASCVD (males age <36;  females age <65) [] []  2. History of premature menopause before age 75 []  38. History of preeclampsia, gestational hypertension, or gestational diabetes []  4. [place] Asian ancestry []  5. Chronic inflammatory disorders: -rheumatoid arthritis -systemic lupus erythematous -psoriasis covering ? 10% of body surface  area -Chron's disease -ulcerative colitis -HIV/AIDS [] [] [] [] [] []  6. hsCRP ? 42m/L documented in the 12 months prior to screening visit without underlying inflammatory conditions []  7. Lipoprotein (a) ? 50 mg/dL or 125 nmol/L [x]  8. eGFR <641mmin/1.73 documented between screening and randomization visits []  9. ABI <0.9 with no symptoms of intermittent claudication []  10. Metabolic syndrome- Increased waist circumference Elevated triglycerides Elevated blood pressure Elevated glucose Low HDL-C (3 or more documented between screening and randomization visits)  [x] [x] [x] [x] [x]  If on background LLT, dose stable for at least 4 weeks prior to screening  [x]  LDL-C ? 7045mL but <190 mg/dL at screening visit [x]   Exclusion Criteria History of Major ASCVD event defined as any: Acute coronary syndrome in past 12 months Prior myocardial infarction Prior ischemic stroke Symptomatic peripheral artery disease as evidenced by either intermittent claudication, previous revascularization, or amputation due to atherosclerotic disease at any time prior to randomization    []    N/A [x]  History of, or planned ischemia-driven revascularization prior to randomization [] N/A [x]  Absence of coronary atherosclerosis on CT angiogram/invasive coronary angiogram in the 2 years prior to randomization [] N/A [x]  Coronary artery calcium score of 0 obtained in the 2 years prior to randomization [] N/A [x]  Active liver disease of hepatic dysfunction, defined as AST or ALT ? 3x ULN from central laboratory test as screening visit [] N/A [x]  Current or planned renal dialysis or transplantation [] N/A [x]  Previous (within 90 days prior to screening), current  or planned treatment with a mAb directed toward PCSK9 [] N/A [x]  Previous exposure to inclisiran or other non-mAb PCSK9 within 2 years prior to randomization [] N/A [x]  Severe concomitant non-cardiovascular disease that is  expected to reduce life expectancy to less than 5 years [] N/A [x]  Use of other investigational drugs within 30 days or until expected pharmacodynamic effected has returned to baseline, whichever is longer [] N/A [x]  History of hypersensitivity to any of the study drugs or its excipients or to drugs of similar chemical class [] N/A [x]  Any surgical or medical condition which may place the participant at higher risk from his/her participation in the study, or is likely to prevent the participant from complying with the requirements of the study of completing the study  []  N/A [x]  Unwillingness or inability to comply with study procedures (including adherence to study visits, fasting blood draws and compliance with study treatment regimens), and medication administration and schedule  []  N/A [x]  Pregnant or nursing women [] N/A [x]  Women of child-bearing potential, unless they are using effective methods of contraception while taking study treatment [] N/A [x]    Patient demography reviewed [x] Female []   Female [x] Age: 71 ETHNICITY Hispanic/Latino []   Non-Hispanic/Latino [x] RACE White []  Black or African American [x]  Asian [] American [place] or [place] Native []  [place] or Other [place] [] Other []  Smoking history reviewed []  Never [x] Former [] Current Every Day [] Current Some Days [] Cigarettes []  Pipe []  Cigars []  e-Cigarettes []  Packs Per Day: N/A  Years:N/A Quit date:N/  Past and current medical history reviewed [] Surgical history reviewed [] Prior or concomitant medications reviewed [] Lipid lowering medications reviewed []  Physical Exam performed by PI or SUB-I []  Vital signs taken at : Height: 160 cm    Weight:159 lbs./ 72.12 kg     Waist circumference: 104 cm   TIME: BP: PULSE:  1503 146/73 89  1506 141/78 86  1509 149/79 84  Mean BP: 145/85    Mean Pulse: 86  Labs collected at: 1500  include: Serum pregnancy  [] Fasting Lipid Profile [x] Broad Clinical Chemistry [x] Hematology panel [x] Urinalysis [x]  Genetic Consent obtained [x]  AE/SAE assessment completed [x]   Current Outpatient Medications:    acetaminophen (TYLENOL) 650 MG CR tablet, Take 650 mg by mouth every 8 (eight) hours as needed for pain., Disp: , Rfl:    atorvastatin (LIPITOR) 40 MG tablet, Take 1 tablet (40 mg total) by mouth daily., Disp: 30 tablet, Rfl: 11   Blood Glucose Monitoring Suppl (FREESTYLE FREEDOM LITE) w/Device KIT, 1 each by Does not apply route 3 (three) times daily before meals., Disp: 1 kit, Rfl: 0   Blood Glucose Monitoring Suppl (TRUETRACK BLOOD GLUCOSE) W/DEVICE KIT, Use to check blood sugar 2 times a day , Disp: , Rfl:    Cholecalciferol (VITAMIN D3) 2000 units TABS, Take 2,000 Units by mouth daily., Disp: , Rfl:    diclofenac Sodium (VOLTAREN) 1 % GEL, Apply 2 g topically 4 (four) times daily., Disp: 300 g, Rfl: 0   Dulaglutide (TRULICITY) 4.5 TO/6.7TI SOPN, Inject 4.5 mg as directed once a week., Disp: 2 mL, Rfl: 2   glucose blood (FREESTYLE LITE) test strip, Use to check blood sugar 2 times a day., Disp: 100 each, Rfl: 12   hydroxypropyl methylcellulose /  hypromellose (ISOPTO TEARS / GONIOVISC) 2.5 % ophthalmic solution, Place 1-2 drops into both eyes 3 (three) times daily as needed for dry eyes., Disp: , Rfl:    ibuprofen (ADVIL) 800 MG tablet, Take 1 tablet (800 mg total) by mouth every 8 (eight) hours as needed for pain, Disp: 30 tablet, Rfl: 1   insulin glargine-yfgn (SEMGLEE, YFGN,) 100 UNIT/ML Pen, Inject 15 Units into the skin at bedtime., Disp: 6 mL, Rfl: 2   Lancets (FREESTYLE) lancets, Use 2 times daily to check blood sugar, Disp: 300 each, Rfl: 1   lisinopril (ZESTRIL) 10 MG tablet, Take 1 tablet (10 mg total) by mouth daily., Disp: 90 tablet, Rfl: 3   metFORMIN (GLUCOPHAGE) 1000 MG tablet, Take 1 tablet (1,000 mg total) by mouth 2 (two) times daily with a meal., Disp: 180 tablet, Rfl: 3    metoprolol succinate (TOPROL-XL) 25 MG 24 hr tablet, Take 1 tablet (25 mg total) by mouth daily before breakfast., Disp: 90 tablet, Rfl: 3   cefdinir (OMNICEF) 300 MG capsule, Take 1 capsule (300 mg total) by mouth 2 (two) times daily. (Patient not taking: Reported on 05/04/2022), Disp: 10 capsule, Rfl: 0   COVID-19 At Home Antigen Test [facility] COVID-19 HOME TEST) KIT, Use as directed, Disp: 4 each, Rfl: 0   cromolyn (NASALCROM) 5.2 MG/ACT nasal spray, Place 1 spray into both nostrils 3 (three) times daily for 14 days., Disp: 26 mL, Rfl: 0   fluticasone (FLONASE) 50 MCG/ACT nasal spray, Place into the nose., Disp: , Rfl:    nitrofurantoin, macrocrystal-monohydrate, (MACROBID) 100 MG capsule, Take 1 capsule (100 mg total) by mouth 2 (two) times daily for 5 days (Patient not taking: Reported on 06/21/2022), Disp: 10 capsule, Rfl: 0   ofloxacin (OCUFLOX) 0.3 % ophthalmic solution, Instill 1 drop into right eye four times a day starting 1 day before surgery (Patient not taking: Reported on 05/04/2022), Disp: 5 mL, Rfl: 1   ofloxacin (OCUFLOX) 0.3 % ophthalmic solution, Place 1 drop into the right eye 4 (four) times daily starting 1 day before surgery (Patient not taking: Reported on 05/04/2022), Disp: 5 mL, Rfl: 1   prednisoLONE acetate (PRED FORTE) 1 % ophthalmic suspension, Instill 1 drop into right eye four times a day after surgery (Patient not taking: Reported on 05/04/2022), Disp: 10 mL, Rfl: 1   PROLENSA 0.07 % SOLN, Instill 1 drop into right eye once a day starting one day before surgery (Patient not taking: Reported on 05/04/2022), Disp: 3 mL, Rfl: 1

## 2022-06-22 ENCOUNTER — Other Ambulatory Visit (HOSPITAL_COMMUNITY): Payer: Self-pay

## 2022-06-25 NOTE — Progress Notes (Signed)
Patient seen today as a possible V1-Prevention participant.  Has DM and elevated lipids, including LPa.  She has not had prior MI or stroke, or ACS.  Her calcium score is unknown.  She has multiple enrichment factors with elevated baseline risk.  She denies chest pain.  LDL remains elevated on high intensity statin treatment.    BP149/79 P84 afebrile.  No JVD Lungs clear  No definite murmur or gallop.  Abd soft. No obvious hepatosplenomegaly No edema Normal gate. No focal neuro deficit  Patient will have screening labs for inclusion.  She and I discussed the medication in detail, and potential implications of elevated Lp(a).  She understands the protocol, and alternatives, and would like to be included.    Loretha Brasil. Lia Foyer, MD Medical Director, [facility]

## 2022-06-26 ENCOUNTER — Other Ambulatory Visit (HOSPITAL_COMMUNITY): Payer: Self-pay

## 2022-06-26 NOTE — Research (Addendum)
Please review labs and document if anything is clinically significant:   .Are there any labs that are clinically significant?  Yes '[]'$  OR No'[]'$   Is the patient eligible to continue enrollment in the study after screening visit?  Yes '[]'$   OR No'[]'$   LDL is 53 which is an exclusion she will be a screen fail

## 2022-06-27 ENCOUNTER — Other Ambulatory Visit (HOSPITAL_COMMUNITY): Payer: Self-pay

## 2022-06-28 ENCOUNTER — Ambulatory Visit
Admission: RE | Admit: 2022-06-28 | Discharge: 2022-06-28 | Disposition: A | Discharge status: Home / Self Care | Payer: No Typology Code available for payment source | Source: Ambulatory Visit | Attending: Family Medicine | Admitting: Family Medicine

## 2022-06-28 DIAGNOSIS — Z1231 Encounter for screening mammogram for malignant neoplasm of breast: Secondary | ICD-10-CM

## 2022-06-29 ENCOUNTER — Encounter: Payer: Self-pay | Admitting: *Deleted

## 2022-06-29 DIAGNOSIS — Z006 Encounter for examination for normal comparison and control in clinical research program: Secondary | ICD-10-CM

## 2022-06-29 NOTE — Research (Signed)
I called patient to let her know that her LDL at 39 is an exclusion for the Victorion-1 Prevention study. I thanked her for her interest in the study. She told me she had been working to lose weight and have a healthy lifestyle. I told her she was doing well and keep up the good work.

## 2022-06-30 ENCOUNTER — Other Ambulatory Visit: Payer: Self-pay | Admitting: Family Medicine

## 2022-06-30 DIAGNOSIS — N6310 Unspecified lump in the right breast, unspecified quadrant: Secondary | ICD-10-CM

## 2022-07-03 ENCOUNTER — Encounter: Payer: No Typology Code available for payment source | Admitting: Student

## 2022-07-05 ENCOUNTER — Ambulatory Visit (INDEPENDENT_AMBULATORY_CARE_PROVIDER_SITE_OTHER): Payer: No Typology Code available for payment source | Admitting: Student

## 2022-07-05 ENCOUNTER — Encounter: Payer: Self-pay | Admitting: Student

## 2022-07-05 VITALS — BP 158/88 | HR 87 | Temp 98.4°F | Ht 63.0 in | Wt 158.6 lb

## 2022-07-05 DIAGNOSIS — K0889 Other specified disorders of teeth and supporting structures: Secondary | ICD-10-CM | POA: Insufficient documentation

## 2022-07-05 DIAGNOSIS — I10 Essential (primary) hypertension: Secondary | ICD-10-CM

## 2022-07-05 DIAGNOSIS — Z1231 Encounter for screening mammogram for malignant neoplasm of breast: Secondary | ICD-10-CM | POA: Insufficient documentation

## 2022-07-05 NOTE — Assessment & Plan Note (Signed)
Patient presents with elevated blood pressure in the 150s.  She reports that she is in pain today.  Her blood pressure was also elevated on her last visit in May.  I informed her that she should keep a blood pressure log and bring it in at next visit.  Patient is currently managed with lisinopril 10 mg daily and metoprolol 25 mg daily.  Plan: - Continue lisinopril 10 mg daily - Continue metoprolol 25 mg daily - Maintain a blood pressure log and bring in at follow-up in 1 month -If blood pressure continues to remain elevated, consider increasing lisinopril

## 2022-07-05 NOTE — Progress Notes (Signed)
CC: Mammogram referral   HPI:  [name] is a 71 y.o. female with a past medical history of hypertension who presents to the clinic to be referred for mammogram.  See assessment and plan for full HPI.  Past Medical History:  Diagnosis Date   Carpal tunnel syndrome    Diabetes mellitus    Type II- diagnosed 30 yrs. ago   Family history of adverse reaction to anesthesia    20 YRS. AGO, HER 19 YR. OLD NEPHEW, ENDED UP ON VENTILATOR- PT. THINKS ITS BECAUSE HE ASPIRATED    Hypercalcemia    Hyperlipidemia    Hypertension    Osteopenia    Rotator cuff syndrome of right shoulder      Current Outpatient Medications:    acetaminophen (TYLENOL) 650 MG CR tablet, Take 650 mg by mouth every 8 (eight) hours as needed for pain., Disp: , Rfl:    atorvastatin (LIPITOR) 40 MG tablet, Take 1 tablet (40 mg total) by mouth daily., Disp: 30 tablet, Rfl: 11   Blood Glucose Monitoring Suppl (FREESTYLE FREEDOM LITE) w/Device KIT, 1 each by Does not apply route 3 (three) times daily before meals., Disp: 1 kit, Rfl: 0   Blood Glucose Monitoring Suppl (TRUETRACK BLOOD GLUCOSE) W/DEVICE KIT, Use to check blood sugar 2 times a day , Disp: , Rfl:    cefdinir (OMNICEF) 300 MG capsule, Take 1 capsule (300 mg total) by mouth 2 (two) times daily. (Patient not taking: Reported on 05/04/2022), Disp: 10 capsule, Rfl: 0   Cholecalciferol (VITAMIN D3) 2000 units TABS, Take 2,000 Units by mouth daily., Disp: , Rfl:    COVID-19 At Home Antigen Test [facility] COVID-19 HOME TEST) KIT, Use as directed, Disp: 4 each, Rfl: 0   cromolyn (NASALCROM) 5.2 MG/ACT nasal spray, Place 1 spray into both nostrils 3 (three) times daily for 14 days., Disp: 26 mL, Rfl: 0   diclofenac Sodium (VOLTAREN) 1 % GEL, Apply 2 g topically 4 (four) times daily., Disp: 300 g, Rfl: 0   Dulaglutide (TRULICITY) 4.5 NK/5.3ZJ SOPN, Inject 4.5 mg as directed once a week., Disp: 2 mL, Rfl: 2   fluticasone (FLONASE) 50 MCG/ACT nasal spray, Place  into the nose., Disp: , Rfl:    glucose blood (FREESTYLE LITE) test strip, Use to check blood sugar 2 times a day., Disp: 100 each, Rfl: 12   hydroxypropyl methylcellulose / hypromellose (ISOPTO TEARS / GONIOVISC) 2.5 % ophthalmic solution, Place 1-2 drops into both eyes 3 (three) times daily as needed for dry eyes., Disp: , Rfl:    ibuprofen (ADVIL) 800 MG tablet, Take 1 tablet (800 mg total) by mouth every 8 (eight) hours as needed for pain, Disp: 30 tablet, Rfl: 1   insulin glargine-yfgn (SEMGLEE, YFGN,) 100 UNIT/ML Pen, Inject 15 Units into the skin at bedtime., Disp: 6 mL, Rfl: 2   Lancets (FREESTYLE) lancets, Use 2 times daily to check blood sugar, Disp: 300 each, Rfl: 1   lisinopril (ZESTRIL) 10 MG tablet, Take 1 tablet (10 mg total) by mouth daily., Disp: 90 tablet, Rfl: 3   metFORMIN (GLUCOPHAGE) 1000 MG tablet, Take 1 tablet (1,000 mg total) by mouth 2 (two) times daily with a meal., Disp: 180 tablet, Rfl: 3   metoprolol succinate (TOPROL-XL) 25 MG 24 hr tablet, Take 1 tablet (25 mg total) by mouth daily before breakfast., Disp: 90 tablet, Rfl: 3   nitrofurantoin, macrocrystal-monohydrate, (MACROBID) 100 MG capsule, Take 1 capsule (100 mg total) by mouth 2 (two) times daily  for 5 days (Patient not taking: Reported on 06/21/2022), Disp: 10 capsule, Rfl: 0   ofloxacin (OCUFLOX) 0.3 % ophthalmic solution, Instill 1 drop into right eye four times a day starting 1 day before surgery (Patient not taking: Reported on 05/04/2022), Disp: 5 mL, Rfl: 1   ofloxacin (OCUFLOX) 0.3 % ophthalmic solution, Place 1 drop into the right eye 4 (four) times daily starting 1 day before surgery (Patient not taking: Reported on 05/04/2022), Disp: 5 mL, Rfl: 1   prednisoLONE acetate (PRED FORTE) 1 % ophthalmic suspension, Instill 1 drop into right eye four times a day after surgery (Patient not taking: Reported on 05/04/2022), Disp: 10 mL, Rfl: 1   PROLENSA 0.07 % SOLN, Instill 1 drop into right eye once a day starting one  day before surgery (Patient not taking: Reported on 05/04/2022), Disp: 3 mL, Rfl: 1  Review of Systems:    Mouth: Patient endorses tooth ache Skin: Patient endorses cyst to right axilla  Physical Exam:  Vitals:   07/05/22 1503 07/05/22 1519  BP: (!) 155/86 (!) 158/88  Pulse: 97 87  Temp: 98.4 F (36.9 C)   TempSrc: Oral   SpO2: 97%   Weight: 158 lb 9.6 oz (71.9 kg)   Height: 5' 3"  (1.6 m)     General: Alert and orientated x3. Patient is sitting comfortably in the room  Eyes: EOM intact  Mouth: Poor dentition noted with missing teeth to upper and lower jaw Head: Normocephalic, atraumatic  Neck: Supple, nontender, full range of motion, No JVD Skin: Cyst noted on right axilla with no signs of infection.  Nontender and not erythematous.   Assessment & Plan:   Screening mammogram, encounter for Patient presents to be scheduled for a mammogram.  Patient was scheduled for a mammogram on 07/19 but it was not able to be done because she had a right axillary cyst that was draining.  She reports that after it was done draining, she went back to get it done, but they told her she would need a new referral.  Right axillary cyst is not draining today.  It is well scarred over and has no signs of infection.  It is not tender to the touch.  It is not erythematous.  It is indurated.  No indication to drain at this point.  Patient is here today for new referral for a mammogram.  Plan: - Referral for mammogram placed  Toothache Patient reports that she has upper molar pain on her right side.  She states that she was evaluated for this in the past and needs a tooth pulled.  She states that she has not been able to get a dentist, and requests one.  On exam, poor dentition is noted with some teeth already missing.  I will refer her to a dentist.  Plan: - Refer to dentistry  Essential hypertension Patient presents with elevated blood pressure in the 150s.  She reports that she is in pain today.   Her blood pressure was also elevated on her last visit in May.  I informed her that she should keep a blood pressure log and bring it in at next visit.  Patient is currently managed with lisinopril 10 mg daily and metoprolol 25 mg daily.  Plan: - Continue lisinopril 10 mg daily - Continue metoprolol 25 mg daily - Maintain a blood pressure log and bring in at follow-up in 1 month -If blood pressure continues to remain elevated, consider increasing lisinopril   Patient seen with  Dr. Glenice Bow, DO PGY-1 Internal Medicine Resident  Pager: [phone]

## 2022-07-05 NOTE — Assessment & Plan Note (Signed)
Patient reports that she has upper molar pain on her right side.  She states that she was evaluated for this in the past and needs a tooth pulled.  She states that she has not been able to get a dentist, and requests one.  On exam, poor dentition is noted with some teeth already missing.  I will refer her to a dentist.  Plan: - Refer to dentistry

## 2022-07-05 NOTE — Patient Instructions (Addendum)
Makinzy, Cleere you for allowing me to take part in your care today.  Here are your instructions.  1.  Regarding your cyst.  It does not look infected today.  There is no drainage.  There is no signs of infections. I will refer you for your mammogram.  2.  Regarding your tooth ache, I would take Tylenol for the pain until you can get to the dentist for the tooth removal.  I have referred you to dentistry.  3.  Regarding your high blood pressure today, it might be due to pain, or could be that your hypertension is worsening, I want to see you back in 1 month for hypertension follow-up   Thank you, Dr. Posey Pronto  If you have any other questions please contact the internal medicine clinic at [phone]

## 2022-07-05 NOTE — Assessment & Plan Note (Addendum)
Patient presents to be scheduled for a mammogram.  Patient was scheduled for a mammogram on 07/19 but it was not able to be done because she had a right axillary cyst that was draining.  She reports that after it was done draining, she went back to get it done, but they told her she would need a new referral.  Right axillary cyst is not draining today.  It is well scarred over and has no signs of infection.  It is not tender to the touch.  It is not erythematous.  It is indurated.  No indication to drain at this point.  Patient is here today for new referral for a mammogram.  Plan: - Referral for mammogram placed

## 2022-07-10 NOTE — Progress Notes (Signed)
Internal Medicine Clinic Attending  Case discussed with Dr. Patel at the time of the visit.  We reviewed the resident's history and exam and pertinent patient test results.  I agree with the assessment, diagnosis, and plan of care documented in the resident's note.  

## 2022-07-11 ENCOUNTER — Other Ambulatory Visit (HOSPITAL_COMMUNITY): Payer: Self-pay

## 2022-07-13 ENCOUNTER — Other Ambulatory Visit (HOSPITAL_COMMUNITY): Payer: Self-pay

## 2022-07-14 ENCOUNTER — Other Ambulatory Visit (HOSPITAL_COMMUNITY): Payer: Self-pay

## 2022-07-17 ENCOUNTER — Other Ambulatory Visit (HOSPITAL_COMMUNITY): Payer: Self-pay

## 2022-08-10 ENCOUNTER — Other Ambulatory Visit (HOSPITAL_COMMUNITY): Payer: Self-pay

## 2022-08-10 ENCOUNTER — Other Ambulatory Visit: Payer: Self-pay | Admitting: Internal Medicine

## 2022-08-10 DIAGNOSIS — E119 Type 2 diabetes mellitus without complications: Secondary | ICD-10-CM

## 2022-08-10 DIAGNOSIS — N6310 Unspecified lump in the right breast, unspecified quadrant: Secondary | ICD-10-CM

## 2022-08-10 MED ORDER — INSULIN GLARGINE-YFGN 100 UNIT/ML ~~LOC~~ SOPN
15.0000 [IU] | PEN_INJECTOR | Freq: Every day | SUBCUTANEOUS | 2 refills | Status: DC
  Filled 2022-08-10: qty 6, 40d supply, fill #0
  Filled 2022-09-26: qty 6, 40d supply, fill #1
  Filled 2022-11-14: qty 6, 40d supply, fill #2

## 2022-08-10 MED ORDER — TRULICITY 4.5 MG/0.5ML ~~LOC~~ SOAJ
4.5000 mg | SUBCUTANEOUS | 2 refills | Status: DC
  Filled 2022-08-10: qty 2, 28d supply, fill #0
  Filled 2022-09-08: qty 2, 28d supply, fill #1
  Filled 2022-10-12: qty 2, 28d supply, fill #2

## 2022-08-11 ENCOUNTER — Other Ambulatory Visit (HOSPITAL_COMMUNITY): Payer: Self-pay

## 2022-08-16 ENCOUNTER — Other Ambulatory Visit: Payer: Self-pay | Admitting: Internal Medicine

## 2022-08-16 ENCOUNTER — Ambulatory Visit
Admission: RE | Admit: 2022-08-16 | Discharge: 2022-08-16 | Disposition: A | Discharge status: Home / Self Care | Payer: No Typology Code available for payment source | Source: Ambulatory Visit | Attending: Family Medicine | Admitting: Family Medicine

## 2022-08-16 DIAGNOSIS — N6310 Unspecified lump in the right breast, unspecified quadrant: Secondary | ICD-10-CM

## 2022-08-28 ENCOUNTER — Telehealth: Payer: Self-pay | Admitting: Dietician

## 2022-08-28 NOTE — Telephone Encounter (Signed)
Lost her glucometer, suggest she call [employer] Customer care [phone].

## 2022-08-29 ENCOUNTER — Other Ambulatory Visit (HOSPITAL_COMMUNITY): Payer: Self-pay

## 2022-08-29 NOTE — Telephone Encounter (Signed)
Patient spoke with [employer] and is new meter is being mailed to her.

## 2022-09-06 ENCOUNTER — Other Ambulatory Visit (HOSPITAL_COMMUNITY): Payer: Self-pay

## 2022-09-08 ENCOUNTER — Other Ambulatory Visit (HOSPITAL_COMMUNITY): Payer: Self-pay

## 2022-09-26 ENCOUNTER — Other Ambulatory Visit (HOSPITAL_COMMUNITY): Payer: Self-pay

## 2022-09-27 ENCOUNTER — Other Ambulatory Visit (HOSPITAL_COMMUNITY): Payer: Self-pay

## 2022-10-09 ENCOUNTER — Other Ambulatory Visit (HOSPITAL_COMMUNITY): Payer: Self-pay

## 2022-10-12 ENCOUNTER — Other Ambulatory Visit (HOSPITAL_COMMUNITY): Payer: Self-pay

## 2022-10-12 ENCOUNTER — Other Ambulatory Visit: Payer: Self-pay | Admitting: Internal Medicine

## 2022-10-12 DIAGNOSIS — M1712 Unilateral primary osteoarthritis, left knee: Secondary | ICD-10-CM

## 2022-10-12 DIAGNOSIS — E119 Type 2 diabetes mellitus without complications: Secondary | ICD-10-CM

## 2022-10-12 MED ORDER — DICLOFENAC SODIUM 1 % EX GEL
2.0000 g | Freq: Four times a day (QID) | CUTANEOUS | 0 refills | Status: DC
  Filled 2022-10-12: qty 200, 25d supply, fill #0
  Filled 2023-02-16: qty 100, 13d supply, fill #1

## 2022-10-12 MED ORDER — METFORMIN HCL 1000 MG PO TABS
1000.0000 mg | ORAL_TABLET | Freq: Two times a day (BID) | ORAL | 3 refills | Status: DC
  Filled 2022-10-12: qty 180, 90d supply, fill #0
  Filled 2022-11-20: qty 60, 30d supply, fill #1
  Filled 2023-03-09: qty 60, 30d supply, fill #2
  Filled 2023-04-11: qty 60, 30d supply, fill #3
  Filled 2023-05-23: qty 60, 30d supply, fill #4
  Filled 2023-06-28: qty 60, 30d supply, fill #5

## 2022-10-12 NOTE — Telephone Encounter (Signed)
Needs DM follow up scheduled. Thanks.

## 2022-10-18 ENCOUNTER — Other Ambulatory Visit (HOSPITAL_COMMUNITY): Payer: Self-pay

## 2022-10-18 ENCOUNTER — Other Ambulatory Visit: Payer: Self-pay | Admitting: Internal Medicine

## 2022-10-18 DIAGNOSIS — I10 Essential (primary) hypertension: Secondary | ICD-10-CM

## 2022-10-19 ENCOUNTER — Other Ambulatory Visit (HOSPITAL_COMMUNITY): Payer: Self-pay

## 2022-10-19 MED ORDER — LISINOPRIL 10 MG PO TABS
10.0000 mg | ORAL_TABLET | Freq: Every day | ORAL | 3 refills | Status: DC
  Filled 2022-10-19: qty 90, 90d supply, fill #0

## 2022-10-23 ENCOUNTER — Other Ambulatory Visit (HOSPITAL_COMMUNITY): Payer: Self-pay

## 2022-11-08 ENCOUNTER — Encounter: Payer: No Typology Code available for payment source | Admitting: Internal Medicine

## 2022-11-14 ENCOUNTER — Other Ambulatory Visit: Payer: Self-pay | Admitting: Internal Medicine

## 2022-11-14 ENCOUNTER — Other Ambulatory Visit (HOSPITAL_COMMUNITY): Payer: Self-pay

## 2022-11-14 DIAGNOSIS — E119 Type 2 diabetes mellitus without complications: Secondary | ICD-10-CM

## 2022-11-16 ENCOUNTER — Other Ambulatory Visit (HOSPITAL_COMMUNITY): Payer: Self-pay

## 2022-11-16 MED ORDER — TRULICITY 4.5 MG/0.5ML ~~LOC~~ SOAJ
4.5000 mg | SUBCUTANEOUS | 0 refills | Status: DC
  Filled 2022-11-16: qty 2, 28d supply, fill #0

## 2022-11-16 NOTE — Telephone Encounter (Signed)
Approved short term refill of trulicity until she can be seen. Has appointment scheduled with resident coming up.

## 2022-11-20 ENCOUNTER — Other Ambulatory Visit (HOSPITAL_COMMUNITY): Payer: Self-pay

## 2022-11-23 ENCOUNTER — Encounter: Payer: Self-pay | Admitting: Student

## 2022-11-23 ENCOUNTER — Ambulatory Visit (INDEPENDENT_AMBULATORY_CARE_PROVIDER_SITE_OTHER): Payer: No Typology Code available for payment source | Admitting: Student

## 2022-11-23 ENCOUNTER — Other Ambulatory Visit: Payer: Self-pay

## 2022-11-23 VITALS — BP 143/79 | HR 98 | Temp 97.8°F | Ht 64.5 in | Wt 156.8 lb

## 2022-11-23 DIAGNOSIS — M1712 Unilateral primary osteoarthritis, left knee: Secondary | ICD-10-CM

## 2022-11-23 DIAGNOSIS — Z794 Long term (current) use of insulin: Secondary | ICD-10-CM | POA: Diagnosis not present

## 2022-11-23 DIAGNOSIS — E119 Type 2 diabetes mellitus without complications: Secondary | ICD-10-CM | POA: Diagnosis not present

## 2022-11-23 DIAGNOSIS — E559 Vitamin D deficiency, unspecified: Secondary | ICD-10-CM

## 2022-11-23 DIAGNOSIS — I10 Essential (primary) hypertension: Secondary | ICD-10-CM | POA: Diagnosis not present

## 2022-11-23 DIAGNOSIS — E785 Hyperlipidemia, unspecified: Secondary | ICD-10-CM

## 2022-11-23 LAB — GLUCOSE, CAPILLARY: Glucose-Capillary: 83 mg/dL (ref 70–99)

## 2022-11-23 LAB — POCT GLYCOSYLATED HEMOGLOBIN (HGB A1C): Hemoglobin A1C: 7.5 % — AB (ref 4.0–5.6)

## 2022-11-23 NOTE — Patient Instructions (Addendum)
You diabetes is doing well continue on your current medications Work on trying to decrease the amount of bread and exercising regularly  Please check you blood sugar daily and bring your meter to your next appointment  Please schedule a follow up for a knee injection  I will send you to physical therapy for the knee  I will call with your lab results   Follow up in 3 months

## 2022-11-24 ENCOUNTER — Other Ambulatory Visit (HOSPITAL_COMMUNITY): Payer: Self-pay

## 2022-11-24 LAB — BMP8+ANION GAP
Anion Gap: 15 mmol/L (ref 10.0–18.0)
BUN/Creatinine Ratio: 18 (ref 12–28)
BUN: 10 mg/dL (ref 8–27)
CO2: 24 mmol/L (ref 20–29)
Calcium: 10.2 mg/dL (ref 8.7–10.3)
Chloride: 102 mmol/L (ref 96–106)
Creatinine, Ser: 0.56 mg/dL — ABNORMAL LOW (ref 0.57–1.00)
Glucose: 89 mg/dL (ref 70–99)
Potassium: 4.4 mmol/L (ref 3.5–5.2)
Sodium: 141 mmol/L (ref 134–144)
eGFR: 97 mL/min/{1.73_m2} (ref >59)

## 2022-11-24 LAB — LIPID PANEL
Chol/HDL Ratio: 2.4 ratio (ref 0.0–4.4)
Cholesterol, Total: 111 mg/dL (ref 100–199)
HDL: 47 mg/dL (ref >39)
LDL Chol Calc (NIH): 43 mg/dL (ref 0–99)
Triglycerides: 115 mg/dL (ref 0–149)
VLDL Cholesterol Cal: 21 mg/dL (ref 5–40)

## 2022-11-24 LAB — VITAMIN D 25 HYDROXY (VIT D DEFICIENCY, FRACTURES): Vit D, 25-Hydroxy: 91.2 ng/mL (ref 30.0–100.0)

## 2022-11-24 MED ORDER — LISINOPRIL 20 MG PO TABS
10.0000 mg | ORAL_TABLET | Freq: Every day | ORAL | 3 refills | Status: DC
  Filled 2022-11-24 – 2023-01-16 (×2): qty 45, 90d supply, fill #0

## 2022-11-27 NOTE — Assessment & Plan Note (Addendum)
BP remains elevated today. She is asymptomatic. Increase lisinopril to 20 mg daily. Check BMP today.

## 2022-11-28 ENCOUNTER — Other Ambulatory Visit (HOSPITAL_COMMUNITY): Payer: Self-pay

## 2022-12-01 ENCOUNTER — Encounter: Payer: Self-pay | Admitting: Student

## 2022-12-01 NOTE — Assessment & Plan Note (Signed)
Repeat vitamin D today.

## 2022-12-01 NOTE — Assessment & Plan Note (Signed)
Continues to have pain in the left knee. Pain more bothersome lately and feels limited in completing errands outside the house. Use ibuprofen on bad days but does not think it helps as much as it used to. No swelling in the knee today. Crepitus on ROM. Would be interested in PT and joint injections.  -referral to PT -She will check a visit for joint injection

## 2022-12-01 NOTE — Progress Notes (Signed)
Established Patient Office Visit  Subjective   Patient ID: Michele Andrews, female    DOB: 03-31-1951  Age: 71 y.o. MRN: [id number]  Chief Complaint  Patient presents with   Follow-up    ROUTINE OFFICE VISIT / A1C / LEFT KNEE PAIN # 8 X 3 WEEKS    Michele Andrews is a 72 y.o. person living with a history listed below who presents to clinic for diabetes follow up. Please refer to problem based charting for further details and assessment and plan of current problem and chronic medical conditions.    Patient Active Problem List   Diagnosis Date Noted   Screening mammogram, encounter for 07/05/2022   Toothache 07/05/2022   UTI (urinary tract infection) 04/26/2022   Nuclear sclerotic cataract of both eyes 06/15/2020   Moderate nonproliferative diabetic retinopathy of both eyes without macular edema associated with type 2 diabetes mellitus ([place]) 06/15/2020   Alopecia 10/16/2019   Nasal polyp 10/02/2018   Neck pain, acute 09/02/2018   Acromioclavicular joint arthritis 10/10/2017   Osteoarthritis of left knee 02/23/2016   Vitamin D insufficiency 03/10/2015   Osteopenia 08/03/2014   GERD (gastroesophageal reflux disease) 03/04/2014   Preventative health care 01/06/2012   Primary hyperparathyroidism ([place]) 11/02/2008   Hyperlipemia 11/22/2006   Essential hypertension 11/22/2006   Diabetes mellitus ([place]) 11/22/1994      ROS: negative as per HPI     Objective:     BP (!) 143/79 (BP Location: Left Arm, Patient Position: Sitting, Cuff Size: Normal)   Pulse 98   Temp 97.8 F (36.6 C) (Oral)   Ht 5' 4.5" (1.638 m)   Wt 156 lb 12.8 oz (71.1 kg)   SpO2 100%   BMI 26.50 kg/m  BP Readings from Last 3 Encounters:  11/23/22 (!) 143/79  07/05/22 (!) 158/88  06/21/22 (!) 149/79      Physical Exam Constitutional:      Appearance: Normal appearance.  HENT:     Head: Normocephalic and atraumatic.     Mouth/Throat:     Mouth: Mucous membranes are moist.     Pharynx:  Oropharynx is clear.  Eyes:     Extraocular Movements: Extraocular movements intact.     Pupils: Pupils are equal, round, and reactive to light.  Cardiovascular:     Rate and Rhythm: Normal rate and regular rhythm.  Pulmonary:     Effort: Pulmonary effort is normal.     Breath sounds: No rhonchi or rales.  Abdominal:     General: Abdomen is flat. Bowel sounds are normal. There is no distension.     Palpations: Abdomen is soft.     Tenderness: There is no abdominal tenderness.  Musculoskeletal:        General: Normal range of motion.     Right lower leg: No edema.     Left lower leg: No edema.  Skin:    General: Skin is warm and dry.     Capillary Refill: Capillary refill takes less than 2 seconds.  Neurological:     General: No focal deficit present.     Mental Status: She is alert and oriented to person, place, and time.  Psychiatric:        Mood and Affect: Mood normal.        Behavior: Behavior normal.      Results for orders placed or performed in visit on 11/23/22  Glucose, capillary  Result Value Ref Range   Glucose-Capillary 83 70 - 99 mg/dL  Lipid Profile  Result Value Ref Range   Cholesterol, Total 111 100 - 199 mg/dL   Triglycerides 115 0 - 149 mg/dL   HDL 47 >39 mg/dL   VLDL Cholesterol Cal 21 5 - 40 mg/dL   LDL Chol Calc (NIH) 43 0 - 99 mg/dL   Chol/HDL Ratio 2.4 0.0 - 4.4 ratio  BMP8+Anion Gap  Result Value Ref Range   Glucose 89 70 - 99 mg/dL   BUN 10 8 - 27 mg/dL   Creatinine, Ser 0.56 (L) 0.57 - 1.00 mg/dL   eGFR 97 >59 mL/min/1.73   BUN/Creatinine Ratio 18 12 - 28   Sodium 141 134 - 144 mmol/L   Potassium 4.4 3.5 - 5.2 mmol/L   Chloride 102 96 - 106 mmol/L   CO2 24 20 - 29 mmol/L   Anion Gap 15.0 10.0 - 18.0 mmol/L   Calcium 10.2 8.7 - 10.3 mg/dL  Vitamin D (25 hydroxy)  Result Value Ref Range   Vit D, 25-Hydroxy 91.2 30.0 - 100.0 ng/mL  POC Hbg A1C  Result Value Ref Range   Hemoglobin A1C 7.5 (A) 4.0 - 5.6 %   HbA1c POC (<> result, manual  entry)     HbA1c, POC (prediabetic range)     HbA1c, POC (controlled diabetic range)      Last metabolic panel Lab Results  Component Value Date   GLUCOSE 89 11/23/2022   NA 141 11/23/2022   K 4.4 11/23/2022   CL 102 11/23/2022   CO2 24 11/23/2022   BUN 10 11/23/2022   CREATININE 0.56 (L) 11/23/2022   EGFR 97 11/23/2022   CALCIUM 10.2 11/23/2022   PHOS 3.2 08/13/2015   PROT 8.6 (H) 05/27/2020   ALBUMIN 4.6 10/15/2019   LABGLOB 4.7 (H) 05/27/2020   AGRATIO 0.8 05/27/2020   BILITOT 0.4 10/15/2019   ALKPHOS 60 10/15/2019   AST 22 10/15/2019   ALT 20 10/15/2019   ANIONGAP 11 04/28/2020   Last lipids Lab Results  Component Value Date   CHOL 111 11/23/2022   HDL 47 11/23/2022   LDLCALC 43 11/23/2022   TRIG 115 11/23/2022   CHOLHDL 2.4 11/23/2022   Last hemoglobin A1c Lab Results  Component Value Date   HGBA1C 7.5 (A) 11/23/2022      The ASCVD Risk score (Arnett DK, et al., 2019) failed to calculate for the following reasons:   The valid total cholesterol range is 130 to 320 mg/dL    Assessment & Plan:   Problem List Items Addressed This Visit       Cardiovascular and Mediastinum   Essential hypertension    BP remains elevated today. She is asymptomatic. Increase lisinopril to 20 mg daily. Check BMP today.       Relevant Medications   lisinopril (ZESTRIL) 20 MG tablet   Other Relevant Orders   BMP8+Anion Gap (Completed)     Endocrine   Diabetes mellitus (HCC)    A1c 7.5 today. Given age reasonable to have goal <8. No meter today. No issues with the medications. Denies any low blood sugars at home. Continue on metfornin 2g daily, semglee 15 unit daily and trulicity 4.5 mg weekly. Asked her to bring meter to next appointment.       Relevant Medications   lisinopril (ZESTRIL) 20 MG tablet     Musculoskeletal and Integument   Osteoarthritis of left knee    Continues to have pain in the left knee. Pain more bothersome lately and feels limited in completing  errands  outside the house. Use ibuprofen on bad days but does not think it helps as much as it used to. No swelling in the knee today. Crepitus on ROM. Would be interested in PT and joint injections.  -referral to PT -She will check a visit for joint injection      Relevant Orders   Ambulatory referral to Physical Therapy     Other   Hyperlipemia    Lipid profile today. Is compliant with Lipitor 40 mg daily.      Relevant Medications   lisinopril (ZESTRIL) 20 MG tablet   Other Relevant Orders   Lipid Profile (Completed)   Vitamin D insufficiency    Repeat vitamin D today.       Relevant Orders   Vitamin D (25 hydroxy) (Completed)   Other Visit Diagnoses     Diabetes mellitus type 2, insulin dependent ([place])    -  Primary   Relevant Medications   lisinopril (ZESTRIL) 20 MG tablet   Other Relevant Orders   POC Hbg A1C (Completed)       Return in about 3 months (around 02/22/2023).    Iona Beard, MD

## 2022-12-01 NOTE — Assessment & Plan Note (Addendum)
A1c 7.5 today. Given age reasonable to have goal <8. No meter today. No issues with the medications. Denies any low blood sugars at home. Continue on metfornin 2g daily, semglee 15 unit daily and trulicity 4.5 mg weekly. Asked her to bring meter to next appointment.

## 2022-12-01 NOTE — Assessment & Plan Note (Signed)
Lipid profile today. Is compliant with Lipitor 40 mg daily.

## 2022-12-06 NOTE — Progress Notes (Signed)
Internal Medicine Clinic Attending ? ?Case discussed with Dr. Liang  At the time of the visit.  We reviewed the resident?s history and exam and pertinent patient test results.  I agree with the assessment, diagnosis, and plan of care documented in the resident?s note. ? ?

## 2022-12-07 ENCOUNTER — Ambulatory Visit (INDEPENDENT_AMBULATORY_CARE_PROVIDER_SITE_OTHER): Payer: No Typology Code available for payment source | Admitting: Student

## 2022-12-07 ENCOUNTER — Encounter: Payer: Self-pay | Admitting: Student

## 2022-12-07 VITALS — BP 140/93 | HR 88 | Temp 98.0°F | Ht 64.5 in | Wt 158.5 lb

## 2022-12-07 DIAGNOSIS — M1712 Unilateral primary osteoarthritis, left knee: Secondary | ICD-10-CM

## 2022-12-07 NOTE — Progress Notes (Signed)
CC: Left knee pain  HPI:  [name] is a 71 y.o. female with PMH as below who presents to clinic today for steroid injection to address worsening left knee osteoarthritis. Please see problem based charting for evaluation, assessment and plan.  Past Medical History:  Diagnosis Date   Carpal tunnel syndrome    Diabetes mellitus    Type II- diagnosed 30 yrs. ago   Family history of adverse reaction to anesthesia    20 YRS. AGO, HER 19 YR. OLD NEPHEW, ENDED UP ON VENTILATOR- PT. THINKS ITS BECAUSE HE ASPIRATED    Hypercalcemia    Hyperlipidemia    Hypertension    Osteopenia    Rotator cuff syndrome of right shoulder     Review of Systems:  Constitutional: Negative for fever, chills or fatigue MSK: Positive for left knee pain. Negative for knee swelling or redness. Negative for knee injury.  Neuro: Negative for leg weakness  Physical Exam: General: Pleasant, well-appearing elderly woman. No acute distress. Cardiac: RRR. No murmurs, rubs or gallops. No LE edema Respiratory: Lungs CTAB. No wheezing or crackles. Skin: Warm, dry and intact without rashes or lesions MSK: Left knee with normal ROM. Mild crepitus. No joint effusion, erythema or warmth. Mild tenderness palpation around the medial aspect of the knee. Neuro: A&O x 3. Moves all extremities  Vitals:   12/07/22 1443 12/07/22 1510  BP: (!) 159/117 (!) 140/93  Pulse: 94 88  Temp: 98 F (36.7 C)   TempSrc: Oral   SpO2: 100%   Weight: 158 lb 8 oz (71.9 kg)   Height: 5' 4.5" (1.638 m)     Assessment & Plan:   Osteoarthritis of left knee Patient with a history of osteoarthritis in multiple joints. She has had worsening left knee pain over the last few weeks. She has tried ibuprofen without significant relief. She has also tried Voltaren gel which she gives her mild relief. Left knee has been bothersome limiting her ability to complete errands outside the house.  Patient is scheduled for left knee steroid  injection today as she has failed conservative therapy. Patient tolerated procedure well (See procedure note).  Plan: -Left knee steroid injection completed today with no complications. -Advised to continue using Voltaren gel and as needed ibuprofen -Still pending PT evaluation -Follow-up as needed   PROCEDURE NOTE  PROCEDURE: left knee joint steroid injection.  PREOPERATIVE DIAGNOSIS: Osteoarthritis of the left knee.  POSTOPERATIVE DIAGNOSIS: Osteoarthritis of the left knee.  PROCEDURE: The patient was apprised of the risks and the benefits of the procedure and informed consent was obtained, as witnessed by Kerin Perna, [place]. Time-out procedure was performed, with confirmation of the patient's name, date of birth, and correct identification of the left knee to be injected. The patient's knee was then marked at the appropriate site for injection placement. The knee was sterilely prepped with Betadine. A 40 mg (1 milliliter) solution of Kenalog was drawn up into a 5 mL syringe with a 2 mL of 1% lidocaine. The patient was injected with a 25-gauge needle at the surprapatella aspect of her left flexed knee. There were no complications. The patient tolerated the procedure well. There was minimal bleeding. The patient was instructed to ice her knee upon leaving clinic and refrain from overuse over the next 3 days. The patient was instructed to go to the emergency room with any usual pain, swelling, or redness occurred in the injected area. The patient was given a followup appointment to evaluate response to  the injection to his increased range of motion and reduction of pain.  The procedure was supervised by attending physician, Dr. Joni Reining.  See Encounters Tab for problem based charting.  Patient discussed with Dr. Lorenz Coaster, MD, MPH

## 2022-12-07 NOTE — Patient Instructions (Addendum)
Thank you, Ms.Rich Number for allowing [place] to provide your care today. Today we discussed your left knee pain. You received a steroid injection today to your left knee to help with the pain.  Please watch the area for swelling, warmth, redness or worsening pain and call the office if you notice any of these signs or symptoms.  Continue doing the knee exercises at home  My Chart Access: [email or web address]  Please follow-up in 3 months or as needed  Please make sure to arrive 15 minutes prior to your next appointment. If you arrive late, you may be asked to reschedule.    We look forward to seeing you next time. Please call our clinic at [phone] if you have any questions or concerns. The best time to call is Monday-Friday from 9am-4pm, but there is someone available 24/7. If after hours or the weekend, call the main hospital number and ask for the Internal Medicine Resident On-Call. If you need medication refills, please notify your pharmacy one week in advance and they will send [place] a request.   Thank you for letting us take part in your care. Wishing you the best!  Lacinda Axon, MD 12/07/2022, 3:45 PM IM Resident, PGY-3 [facility] 41:10

## 2022-12-07 NOTE — Assessment & Plan Note (Addendum)
Patient with a history of osteoarthritis in multiple joints. She has had worsening left knee pain over the last few weeks. She has tried ibuprofen without significant relief. She has also tried Voltaren gel which she gives her mild relief. Left knee has been bothersome limiting her ability to complete errands outside the house.  Patient is scheduled for left knee steroid injection today as she has failed conservative therapy. Patient tolerated procedure well (See procedure note).  Plan: -Left knee steroid injection completed today with no complications. -Advised to continue using Voltaren gel and as needed ibuprofen -Still pending PT evaluation -Follow-up as needed

## 2022-12-08 ENCOUNTER — Other Ambulatory Visit: Payer: Self-pay | Admitting: Internal Medicine

## 2022-12-08 ENCOUNTER — Other Ambulatory Visit (HOSPITAL_COMMUNITY): Payer: Self-pay

## 2022-12-08 DIAGNOSIS — E785 Hyperlipidemia, unspecified: Secondary | ICD-10-CM

## 2022-12-08 DIAGNOSIS — E782 Mixed hyperlipidemia: Secondary | ICD-10-CM

## 2022-12-12 ENCOUNTER — Other Ambulatory Visit (HOSPITAL_COMMUNITY): Payer: Self-pay

## 2022-12-12 MED ORDER — ATORVASTATIN CALCIUM 40 MG PO TABS
40.0000 mg | ORAL_TABLET | Freq: Every day | ORAL | 11 refills | Status: DC
  Filled 2022-12-12: qty 30, 30d supply, fill #0
  Filled 2023-01-11: qty 30, 30d supply, fill #1
  Filled 2023-02-12: qty 30, 30d supply, fill #2
  Filled 2023-03-16: qty 30, 30d supply, fill #3
  Filled 2023-04-16: qty 30, 30d supply, fill #4
  Filled 2023-05-16: qty 30, 30d supply, fill #5
  Filled 2023-06-21: qty 30, 30d supply, fill #6
  Filled 2023-07-16: qty 30, 30d supply, fill #7

## 2022-12-14 NOTE — Progress Notes (Signed)
Internal Medicine Clinic Attending  I saw and evaluated the patient.  I personally confirmed the key portions of the history and exam documented by the resident  and I reviewed pertinent patient test results.  The assessment, diagnosis, and plan were formulated together and I agree with the documentation in the resident's note. I was present for the entire procedure. 

## 2023-01-09 ENCOUNTER — Other Ambulatory Visit (HOSPITAL_COMMUNITY): Payer: Self-pay

## 2023-01-09 ENCOUNTER — Other Ambulatory Visit: Payer: Self-pay | Admitting: Internal Medicine

## 2023-01-09 DIAGNOSIS — E119 Type 2 diabetes mellitus without complications: Secondary | ICD-10-CM

## 2023-01-10 ENCOUNTER — Other Ambulatory Visit (HOSPITAL_COMMUNITY): Payer: Self-pay

## 2023-01-10 MED ORDER — TRULICITY 4.5 MG/0.5ML ~~LOC~~ SOAJ
4.5000 mg | SUBCUTANEOUS | 2 refills | Status: DC
  Filled 2023-01-10: qty 2, 28d supply, fill #0
  Filled 2023-02-16 – 2023-03-09 (×3): qty 2, 28d supply, fill #1
  Filled 2023-04-06: qty 2, 28d supply, fill #2

## 2023-01-10 MED ORDER — INSULIN GLARGINE-YFGN 100 UNIT/ML ~~LOC~~ SOPN
15.0000 [IU] | PEN_INJECTOR | Freq: Every day | SUBCUTANEOUS | 2 refills | Status: DC
  Filled 2023-01-10: qty 6, 40d supply, fill #0
  Filled 2023-02-16: qty 6, 40d supply, fill #1
  Filled 2023-04-06: qty 6, 40d supply, fill #2

## 2023-01-10 NOTE — Telephone Encounter (Signed)
Please have patient schedule follow up with me, thanks

## 2023-01-11 ENCOUNTER — Other Ambulatory Visit (HOSPITAL_COMMUNITY): Payer: Self-pay

## 2023-01-11 ENCOUNTER — Other Ambulatory Visit: Payer: Self-pay | Admitting: Internal Medicine

## 2023-01-11 DIAGNOSIS — M25512 Pain in left shoulder: Secondary | ICD-10-CM

## 2023-01-11 MED ORDER — IBUPROFEN 800 MG PO TABS
800.0000 mg | ORAL_TABLET | Freq: Three times a day (TID) | ORAL | 1 refills | Status: DC | PRN
  Filled 2023-01-11: qty 30, 10d supply, fill #0
  Filled 2023-04-11: qty 30, 10d supply, fill #1

## 2023-01-16 ENCOUNTER — Other Ambulatory Visit (HOSPITAL_COMMUNITY): Payer: Self-pay

## 2023-01-16 ENCOUNTER — Other Ambulatory Visit: Payer: Self-pay

## 2023-01-16 ENCOUNTER — Other Ambulatory Visit: Payer: Self-pay | Admitting: Internal Medicine

## 2023-01-16 DIAGNOSIS — I10 Essential (primary) hypertension: Secondary | ICD-10-CM

## 2023-01-16 MED ORDER — METOPROLOL SUCCINATE ER 25 MG PO TB24
25.0000 mg | ORAL_TABLET | Freq: Every day | ORAL | 3 refills | Status: DC
  Filled 2023-01-16: qty 90, 90d supply, fill #0
  Filled 2023-04-23: qty 90, 90d supply, fill #1

## 2023-02-14 ENCOUNTER — Other Ambulatory Visit (HOSPITAL_COMMUNITY): Payer: Self-pay

## 2023-02-14 ENCOUNTER — Ambulatory Visit (INDEPENDENT_AMBULATORY_CARE_PROVIDER_SITE_OTHER): Payer: 59 | Admitting: Internal Medicine

## 2023-02-14 ENCOUNTER — Other Ambulatory Visit: Payer: Self-pay

## 2023-02-14 ENCOUNTER — Encounter: Payer: Self-pay | Admitting: Internal Medicine

## 2023-02-14 VITALS — BP 155/96 | HR 90 | Temp 98.0°F | Ht 64.5 in | Wt 157.9 lb

## 2023-02-14 DIAGNOSIS — E0800 Diabetes mellitus due to underlying condition with hyperosmolarity without nonketotic hyperglycemic-hyperosmolar coma (NKHHC): Secondary | ICD-10-CM

## 2023-02-14 DIAGNOSIS — Z1211 Encounter for screening for malignant neoplasm of colon: Secondary | ICD-10-CM

## 2023-02-14 DIAGNOSIS — E7841 Elevated Lipoprotein(a): Secondary | ICD-10-CM | POA: Diagnosis not present

## 2023-02-14 DIAGNOSIS — Z794 Long term (current) use of insulin: Secondary | ICD-10-CM | POA: Diagnosis not present

## 2023-02-14 DIAGNOSIS — E559 Vitamin D deficiency, unspecified: Secondary | ICD-10-CM | POA: Diagnosis not present

## 2023-02-14 DIAGNOSIS — I10 Essential (primary) hypertension: Secondary | ICD-10-CM | POA: Diagnosis not present

## 2023-02-14 DIAGNOSIS — M1712 Unilateral primary osteoarthritis, left knee: Secondary | ICD-10-CM

## 2023-02-14 DIAGNOSIS — M8580 Other specified disorders of bone density and structure, unspecified site: Secondary | ICD-10-CM

## 2023-02-14 DIAGNOSIS — E119 Type 2 diabetes mellitus without complications: Secondary | ICD-10-CM | POA: Diagnosis not present

## 2023-02-14 DIAGNOSIS — Z7984 Long term (current) use of oral hypoglycemic drugs: Secondary | ICD-10-CM | POA: Diagnosis not present

## 2023-02-14 LAB — POCT GLYCOSYLATED HEMOGLOBIN (HGB A1C): Hemoglobin A1C: 8 % — AB (ref 4.0–5.6)

## 2023-02-14 LAB — GLUCOSE, CAPILLARY: Glucose-Capillary: 105 mg/dL — ABNORMAL HIGH (ref 70–99)

## 2023-02-14 MED ORDER — LISINOPRIL 20 MG PO TABS
20.0000 mg | ORAL_TABLET | Freq: Every day | ORAL | 3 refills | Status: DC
  Filled 2023-02-14 – 2023-02-23 (×3): qty 90, 90d supply, fill #0
  Filled 2023-06-08: qty 90, 90d supply, fill #1

## 2023-02-14 NOTE — Assessment & Plan Note (Signed)
On atorvastatin 40 mg daily for primary prevention.  Her last LDL was at goal.  Continue.

## 2023-02-14 NOTE — Assessment & Plan Note (Signed)
Much improved after intra-articular steroid injection 3 months ago.  Monitor.

## 2023-02-14 NOTE — Progress Notes (Signed)
Subjective:   Patient ID: Michele Andrews female   DOB: 09/13/1951 72 y.o.   MRN: [id number]  HPI: [name] is a 72 y.o. female with past medical history outlined below here for follow up of HTN and DM. For further details of today's visit, please refer to the assessment and plan below.   Past Medical History:  Diagnosis Date   Carpal tunnel syndrome    Diabetes mellitus    Type II- diagnosed 30 yrs. ago   Family history of adverse reaction to anesthesia    20 YRS. AGO, HER 19 YR. OLD NEPHEW, ENDED UP ON VENTILATOR- PT. THINKS ITS BECAUSE HE ASPIRATED    Hypercalcemia    Hyperlipidemia    Hypertension    Osteopenia    Rotator cuff syndrome of right shoulder    Current Outpatient Medications  Medication Sig Dispense Refill   acetaminophen (TYLENOL) 650 MG CR tablet Take 650 mg by mouth every 8 (eight) hours as needed for pain.     atorvastatin (LIPITOR) 40 MG tablet Take 1 tablet (40 mg total) by mouth daily. 30 tablet 11   Blood Glucose Monitoring Suppl (FREESTYLE FREEDOM LITE) w/Device KIT 1 each by Does not apply route 3 (three) times daily before meals. 1 kit 0   Blood Glucose Monitoring Suppl (TRUETRACK BLOOD GLUCOSE) W/DEVICE KIT Use to check blood sugar 2 times a day      Cholecalciferol (VITAMIN D3) 2000 units TABS Take 2,000 Units by mouth daily.     cromolyn (NASALCROM) 5.2 MG/ACT nasal spray Place 1 spray into both nostrils 3 (three) times daily for 14 days. 26 mL 0   diclofenac Sodium (VOLTAREN) 1 % GEL Apply 2 g topically 4 (four) times daily. 300 g 0   Dulaglutide (TRULICITY) 4.5 0000000 SOPN Inject 4.5 mg as directed once a week. 2 mL 2   fluticasone (FLONASE) 50 MCG/ACT nasal spray Place into the nose.     glucose blood (FREESTYLE LITE) test strip Use to check blood sugar 2 times a day. 100 each 12   hydroxypropyl methylcellulose / hypromellose (ISOPTO TEARS / GONIOVISC) 2.5 % ophthalmic solution Place 1-2 drops into both eyes 3 (three) times daily as needed  for dry eyes.     ibuprofen (ADVIL) 800 MG tablet Take 1 tablet (800 mg total) by mouth every 8 (eight) hours as needed for pain 30 tablet 1   insulin glargine-yfgn (SEMGLEE, YFGN,) 100 UNIT/ML Pen Inject 15 Units into the skin at bedtime. 6 mL 2   Lancets (FREESTYLE) lancets Use 2 times daily to check blood sugar 300 each 1   lisinopril (ZESTRIL) 20 MG tablet Take 1 tablet (20 mg total) by mouth daily. 90 tablet 3   metFORMIN (GLUCOPHAGE) 1000 MG tablet Take 1 tablet (1,000 mg total) by mouth 2 (two) times daily with a meal. 180 tablet 3   metoprolol succinate (TOPROL-XL) 25 MG 24 hr tablet Take 1 tablet (25 mg total) by mouth daily before breakfast. 90 tablet 3   No current facility-administered medications for this visit.   Family History  Problem Relation Age of Onset   Hypertension Mother    Cancer Mother    Breast cancer Mother 77   Breast cancer Maternal Aunt 24   Breast cancer Cousin 57   Social History   Socioeconomic History   Marital status: Married    Spouse name: Not on file   Number of children: Not on file   Years of education: 29  Highest education level: Not on file  Occupational History   Occupation: Research scientist (life sciences): [employer]  Tobacco Use   Smoking status: Never   Smokeless tobacco: Never  Substance and Sexual Activity   Alcohol use: No   Drug use: No   Sexual activity: Not on file  Other Topics Concern   Not on file  Social History Narrative   Not on file   Social Determinants of Health   Financial Resource Strain: Not on file  Food Insecurity: No Food Insecurity (11/23/2022)   Hunger Vital Sign    Worried About Running Out of Food in the Last Year: Never true    Ran Out of Food in the Last Year: Never true  Transportation Needs: Unknown (11/23/2022)   PRAPARE - Hydrologist (Medical): No    Lack of Transportation (Non-Medical): Not on file  Physical Activity: Not on file  Stress: Not on file  Social  Connections: Moderately Integrated (11/23/2022)   Social Connection and Isolation Panel [NHANES]    Frequency of Communication with Friends and Family: More than three times a week    Frequency of Social Gatherings with Friends and Family: More than three times a week    Attends Religious Services: More than 4 times per year    Active Member of Clubs or Organizations: No    Attends Archivist Meetings: Never    Marital Status: Married     Objective:  Physical Exam:  Vitals:   02/14/23 0841 02/14/23 0901  BP: (!) 146/81 (!) 155/96  Pulse: 88 90  Temp: 98 F (36.7 C)   TempSrc: Oral   SpO2: 100%   Weight: 157 lb 14.4 oz (71.6 kg)   Height: 5' 4.5" (1.638 m)     Constitutional: NAD, well appearing  Neck: No lymphadenopathy or goiter  Cardiovascular: RRR, no m/r/g Pulmonary/Chest: Clear bilaterally, normal effort on RA Extremities: Warm, no edema    Assessment & Plan:   Vitamin D insufficiency Vitamin D D level last checked 3 months ago looked excellent.  She is currently taking 400 IU of over-the-counter vitamin D.  We are rechecking DEXA scan today given history of osteopenia.  Osteopenia Follow-up repeat DEXA.  Continue vitamin D.  Osteoarthritis of left knee Much improved after intra-articular steroid injection 3 months ago.  Monitor.  Essential hypertension Blood pressure is elevated today.  She is currently taking lisinopril 10 mg daily and metoprolol 25 mg XL daily.  Plan to increase lisinopril to 20 mg daily.  Last renal function was normal.  Follow-up in 3 months.  Diabetes mellitus ([place]) Hemoglobin A1c today is up to 8.0.  She is currently taking Trulicity 4.5 mg weekly, metformin 1000 mg twice daily, and Semglee 15 units nightly.  She admits to some recent dietary indiscretion.  She will work on her diet and follow-up in 3 months for repeat hemoglobin A1c.  If persistently elevated will consider increasing Semglee.  Hyperlipemia On atorvastatin 40  mg daily for primary prevention.  Her last LDL was at goal.  Continue.

## 2023-02-14 NOTE — Assessment & Plan Note (Signed)
Follow-up repeat DEXA.  Continue vitamin D.

## 2023-02-14 NOTE — Patient Instructions (Addendum)
Ms. Stepper,  It was a pleasure to see you today. Please continue to take all of your medications as prescribed. Follow up with me again in 3 months or sooner if you have any problems.   If you have any questions or concerns, call our clinic at [phone] or after hours call [phone] and ask for the internal medicine resident on call.   Thank you!  Dr. Darnell Level

## 2023-02-14 NOTE — Assessment & Plan Note (Signed)
Hemoglobin A1c today is up to 8.0.  She is currently taking Trulicity 4.5 mg weekly, metformin 1000 mg twice daily, and Semglee 15 units nightly.  She admits to some recent dietary indiscretion.  She will work on her diet and follow-up in 3 months for repeat hemoglobin A1c.  If persistently elevated will consider increasing Semglee.

## 2023-02-14 NOTE — Assessment & Plan Note (Signed)
Blood pressure is elevated today.  She is currently taking lisinopril 10 mg daily and metoprolol 25 mg XL daily.  Plan to increase lisinopril to 20 mg daily.  Last renal function was normal.  Follow-up in 3 months.

## 2023-02-14 NOTE — Assessment & Plan Note (Signed)
Vitamin D D level last checked 3 months ago looked excellent.  She is currently taking 400 IU of over-the-counter vitamin D.  We are rechecking DEXA scan today given history of osteopenia.

## 2023-02-16 ENCOUNTER — Other Ambulatory Visit (HOSPITAL_COMMUNITY): Payer: Self-pay

## 2023-02-20 ENCOUNTER — Other Ambulatory Visit: Payer: 59

## 2023-02-20 DIAGNOSIS — Z1211 Encounter for screening for malignant neoplasm of colon: Secondary | ICD-10-CM

## 2023-02-22 LAB — FECAL OCCULT BLOOD, IMMUNOCHEMICAL: Fecal Occult Bld: NEGATIVE

## 2023-02-23 ENCOUNTER — Other Ambulatory Visit (HOSPITAL_COMMUNITY): Payer: Self-pay

## 2023-03-05 ENCOUNTER — Telehealth: Payer: Self-pay | Admitting: Dietician

## 2023-03-05 DIAGNOSIS — Z794 Long term (current) use of insulin: Secondary | ICD-10-CM

## 2023-03-05 NOTE — Telephone Encounter (Signed)
Saw Ms. Hassell Done who requested Continuous glucose monitoring appointment. Request referral for same.

## 2023-03-06 ENCOUNTER — Encounter: Payer: Self-pay | Admitting: *Deleted

## 2023-03-06 NOTE — Telephone Encounter (Signed)
Appointment scheduled for 03/08/23.

## 2023-03-06 NOTE — Telephone Encounter (Signed)
Done. Thanks.

## 2023-03-08 ENCOUNTER — Ambulatory Visit (INDEPENDENT_AMBULATORY_CARE_PROVIDER_SITE_OTHER): Payer: 59 | Admitting: Dietician

## 2023-03-08 DIAGNOSIS — Z794 Long term (current) use of insulin: Secondary | ICD-10-CM | POA: Diagnosis not present

## 2023-03-08 DIAGNOSIS — E119 Type 2 diabetes mellitus without complications: Secondary | ICD-10-CM | POA: Diagnosis not present

## 2023-03-08 NOTE — Progress Notes (Signed)
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 Start Time: 1425 Appt. End Time: [id number]  03/08/2023  [name], identified by name and date of birth, is a 72 y.o. female with a diagnosis of Diabetes:  .   ASSESSMENT  She deferred weight today.  She feels like she has gotten off track.  Lab Results  Component Value Date   HGBA1C 8.0 (A) 02/14/2023   HGBA1C 7.5 (A) 11/23/2022   HGBA1C 7.4 (A) 05/10/2022   HGBA1C 7.8 (A) 11/23/2021   HGBA1C 7.3 (A) 06/27/2021    Estimated body mass index is 26.68 kg/m as calculated from the following:   Height as of 02/14/23: 5' 4.5" (1.638 m).   Weight as of 02/14/23: 157 lb 14.4 oz (71.6 kg).  Her weight is appropriate for her height and age.. suggest she weigh no loess than 150#.     Diabetes Self-Management Education - 03/08/23 1600       Health Coping   How would you rate your overall health? Fair      Psychosocial Assessment   Patient Belief/Attitude about Diabetes Motivated to manage diabetes    What is the hardest part about your diabetes right now, causing you the most concern, or is the most worrisome to you about your diabetes?   --   to assess at future visit   Self-care barriers Lack of material resources;Other (comment)   greaving due to loss of her father and husband's cancer   Self-management support Doctor's office;CDE visits;Family    Patient Concerns Glycemic Control;Medication;Monitoring    Special Needs None    Preferred Learning Style Hands on    Learning Readiness Ready      Pre-Education Assessment   Patient understands the diabetes disease and treatment process. Comprehends key points    Patient understands incorporating nutritional management into lifestyle. Comprehends key points    Patient undertands incorporating physical activity into lifestyle. Comprehends key points    Patient understands using medications safely.  Comprehends key points    Patient understands monitoring blood glucose, interpreting and using results Comprehends key points    Patient understands prevention, detection, and treatment of acute complications. Comprehends key points    Patient understands prevention, detection, and treatment of chronic complications. Compreheands key points    Patient understands how to develop strategies to address psychosocial issues. Comprehends key points    Patient understands how to develop strategies to promote health/change behavior. Needs Review      Complications   Last HgB A1C per patient/outside source 8 %    How often do you check your blood sugar? 3-4 times / week    Fasting Blood glucose range (mg/dL) 130-179    Postprandial Blood glucose range (mg/dL) 180-200;>200    Number of hypoglycemic episodes per month 0    Number of hyperglycemic episodes ( >200mg /dL): Occasional    Can you tell when your blood sugar is high? No    Have you had a dilated eye exam in the past 12 months? Yes    Have you had a dental exam in the past 12 months? Yes    Are you checking your feet? Yes    How many days per week are you checking your feet? 7      Dietary Intake   Breakfast she is keeping food records for next 2 weeks      Activity / Exercise  Activity / Exercise Type ADL's;Light (walking / raking leaves)   about to start back regular schedule     Patient Education   Previous Diabetes Education Yes (please comment)   here   Monitoring Taught/evaluated CGM (comment)    Lifestyle and Health Coping Helped patient develop diabetes management plan for (enter comment)   lowering blood glucose to lower A1C     Individualized Goals (developed by patient)   Physical Activity Exercise 3-5 times per week    Monitoring  Consistenly use CGM      Outcomes   Program Status Re-entered      Subsequent Visit   Since your last visit have you continued or begun to take your medications as prescribed? No    difficulty getting Trulicity x 2 months   Since your last visit have you had your blood pressure checked? Yes    Is your most recent blood pressure lower, unchanged, or higher since your last visit? Higher    Since your last visit have you experienced any weight changes? Loss    Weight Loss (lbs) 8    Since your last visit, are you checking your blood glucose at least once a day? No             Learning Objective:  Patient will have a greater understanding of diabetes self-management. Patient education plan is to attend individual and/or group sessions per assessed needs and concerns.   Plan:   Patient Instructions  Please scan your sensor with your phone until it vibrates.   The vibration means your sensor has started. The screen should say you have a 60 minute warmup period before you start getting blood sugars.   Call me if you have questions to my cell  [phone] if it does not start.  You have until June 6th or after before you get another A1c.   Goals for next two weeks:  Keep food records as best you can Wear Continuous glucose monitoring Thinking about changing how much  Bread and butter you eat. You are going to start exercising next week at least 3 days a week  [name] [phone]    Expected Outcomes:  Demonstrated interest in learning. Expect positive outcomes  Education material provided: Diabetes Resources  If problems or questions, patient to contact team via:  Phone  Future DSME appointment: - 2 wks Debera Lat, RD 03/08/2023 4:49 PM.

## 2023-03-08 NOTE — Patient Instructions (Addendum)
Please scan your sensor with your phone until it vibrates.   The vibration means your sensor has started. The screen should say you have a 60 minute warmup period before you start getting blood sugars.   Call me if you have questions to my cell  [phone] if it does not start.  You have until June 6th or after before you get another A1c.   Goals for next two weeks:  Keep food records as best you can Wear Continuous glucose monitoring Thinking about changing how much  Bread and butter you eat. You are going to start exercising next week at least 3 days a week  [name] [phone]

## 2023-03-09 ENCOUNTER — Other Ambulatory Visit (HOSPITAL_COMMUNITY): Payer: Self-pay

## 2023-03-15 ENCOUNTER — Other Ambulatory Visit (HOSPITAL_COMMUNITY): Payer: Self-pay

## 2023-03-15 ENCOUNTER — Other Ambulatory Visit: Payer: Self-pay | Admitting: Internal Medicine

## 2023-03-15 DIAGNOSIS — M1712 Unilateral primary osteoarthritis, left knee: Secondary | ICD-10-CM

## 2023-03-16 ENCOUNTER — Other Ambulatory Visit: Payer: Self-pay

## 2023-03-16 ENCOUNTER — Other Ambulatory Visit (HOSPITAL_COMMUNITY): Payer: Self-pay

## 2023-03-16 MED ORDER — DICLOFENAC SODIUM 1 % EX GEL
2.0000 g | Freq: Four times a day (QID) | CUTANEOUS | 0 refills | Status: DC
  Filled 2023-03-16: qty 300, 15d supply, fill #0

## 2023-03-22 ENCOUNTER — Encounter: Payer: Self-pay | Admitting: Dietician

## 2023-03-27 ENCOUNTER — Ambulatory Visit (INDEPENDENT_AMBULATORY_CARE_PROVIDER_SITE_OTHER): Payer: 59 | Admitting: Dietician

## 2023-03-27 DIAGNOSIS — Z794 Long term (current) use of insulin: Secondary | ICD-10-CM | POA: Diagnosis not present

## 2023-03-27 DIAGNOSIS — E119 Type 2 diabetes mellitus without complications: Secondary | ICD-10-CM

## 2023-03-27 NOTE — Patient Instructions (Signed)
Great Job making healthy changes-  Getting and taking Trulicity Decreasing your bread, soda, sugar in coffee, cutting down on portions.   You said you want to go to the gym, recommend tracking steps to see how much activity you are getting in daily life.  Start with goal of 5000 (10000 steps a day is weight watchers recommendation)  I will call when I get more sensors.  [name] [phone]

## 2023-03-27 NOTE — Progress Notes (Signed)
Deferred weight today.

## 2023-03-27 NOTE — Progress Notes (Signed)
Diabetes Self-Management Education  Visit Type:  Follow-up  Appt. Start Time: 14:00   App. End Time: 15:00  03/27/2023  [name], identified by name and date of birth, is a 72 y.o. female with a diagnosis of Diabetes:  .   ASSESSMENT  She came today to follow up her CGM FreeStyle libre 3.and discussed her CGM report.   Patient states she had made many changes since she has the CGM. Changes such as: No sugar in her coffee No juice fruit No sodas Less butter  Decrease bread. Controlling her portions sizes.  Patient states she has more energy and wants to continue using CGM She is going to start exercising 30 minutes after work.  Estimated body mass index is 26.68 kg/m as calculated from the following:   Height as of 02/14/23: 5' 4.5" (1.638 m).   Weight as of 02/14/23: 157 lb 14.4 oz (71.6 kg).   Her weight has been unchanged for more than a year Wt Readings from Last 3 Encounters:  02/14/23 157 lb 14.4 oz (71.6 kg)  12/07/22 158 lb 8 oz (71.9 kg)  11/23/22 156 lb 12.8 oz (71.1 kg)       Diabetes Self-Management Education - 03/27/23 1500       Health Coping   How would you rate your overall health? Good      Psychosocial Assessment   Patient Belief/Attitude about Diabetes Motivated to manage diabetes    What is the hardest part about your diabetes right now, causing you the most concern, or is the most worrisome to you about your diabetes?   Making healty food and beverage choices   Food choices that give her high and low blood glucose   Self-care barriers Other (comment)   Busy family live such as take care of her mother.   Self-management support Doctor's office    Patient Concerns Healthy Lifestyle    Special Needs None    Preferred Learning Style Hands on    Learning Readiness Ready      Pre-Education Assessment   Patient understands the diabetes disease and treatment process. Comprehends key points    Patient understands incorporating nutritional  management into lifestyle. Comprehends key points    Patient undertands incorporating physical activity into lifestyle. Comprehends key points    Patient understands using medications safely. Comprehends key points    Patient understands monitoring blood glucose, interpreting and using results Comprehends key points    Patient understands prevention, detection, and treatment of acute complications. Comprehends key points    Patient understands prevention, detection, and treatment of chronic complications. Compreheands key points    Patient understands how to develop strategies to address psychosocial issues. Comprehends key points    Patient understands how to develop strategies to promote health/change behavior. Needs Review      Complications   Last HgB A1C per patient/outside source 8 %   02/14/2023   How often do you check your blood sugar? > 4 times/day    Fasting Blood glucose range (mg/dL) 161-096    Postprandial Blood glucose range (mg/dL) 045-409    Number of hypoglycemic episodes per month 0    Number of hyperglycemic episodes ( >200mg /dL): Weekly    Can you tell when your blood sugar is high? Yes    What do you do if your blood sugar is high? it needs to be assess at future at future visit    Have you had a dilated eye exam in the past 12 months?  Yes    Have you had a dental exam in the past 12 months? Yes    How many days per week are you checking your feet? 7      Dietary Intake   Breakfast bacon & egg    Lunch Yogurt, blueberry    Dinner Fruit salad or one pancke + one egg + bacon    Beverage(s) Water, coffee      Activity / Exercise   Activity / Exercise Type ADL's      Patient Education   Previous Diabetes Education Yes (please comment)    Being Active Identified with patient nutritional and/or medication changes necessary with exercise.;Helped patient identify appropriate exercises in relation to his/her diabetes, diabetes complications and other health issue.     Monitoring Taught/evaluated CGM (comment)    Lifestyle and Health Coping Helped patient develop diabetes management plan for (enter comment)      Individualized Goals (developed by patient)   Physical Activity Exercise 5-7 days per week    Medications take my medication as prescribed    Monitoring  Consistenly use CGM      Patient Self-Evaluation of Goals - Patient rates self as meeting previously set goals (% of time)   Physical Activity >75% (most of the time)    Medications >75% (most of the time)    Monitoring >75% (most of the time)      Post-Education Assessment   Patient understands the diabetes disease and treatment process. Comprehends key points    Patient understands incorporating nutritional management into lifestyle. Comprehends key points    Patient undertands incorporating physical activity into lifestyle. Comprehends key points    Patient understands using medications safely. Comphrehends key points    Patient understands monitoring blood glucose, interpreting and using results Comprehends key points    Patient understands prevention, detection, and treatment of acute complications. Comprehends key points    Patient understands prevention, detection, and treatment of chronic complications. Comprehends key points    Patient understands how to develop strategies to address psychosocial issues. Comprehends key points    Patient understands how to develop strategies to promote health/change behavior. Needs Review      Outcomes   Program Status Not Completed      Subsequent Visit   Since your last visit have you continued or begun to take your medications as prescribed? Yes    Since your last visit have you had your blood pressure checked? No    Is your most recent blood pressure lower, unchanged, or higher since your last visit? --   unknown   Since your last visit have you experienced any weight changes? --   It needs to assess at future visit            Learning  Objective:  Patient will have a greater understanding of diabetes self-management. Patient education plan is to attend individual and/or group sessions per assessed needs and concerns.   Plan:   Patient Instructions  Great Job making healthy changes-  Getting and taking Trulicity Decreasing your bread, soda, sugar in coffee, cutting down on portions.   You said you want to go to the gym, recommend tracking steps to see how much activity you are getting in daily life.  Start with goal of 5000 (10000 steps a day is weight watchers recommendation)  I will call when I get more sensors.  [name] [phone]   Expected Outcomes:  Demonstrated interest in learning. Expect positive outcomes  Education material provided:  Diabetes Resources and printed CGM report.  If problems or questions, patient to contact team via:  Phone  Future DSME appointment: - PRN Julius Bowels, Dietetic Intern  03/27/2023  5:20 PM

## 2023-03-28 NOTE — Progress Notes (Addendum)
I have reviewed and concur with this student's documentation.  The patient's main goal is to lower her blood sugars and A1c. CGM shows  CGM Results from download:  March 29- April 12, 24  % Time CGM active:   96 %   (Goal >70%)  Average glucose:   143 mg/dL for 14 days  Glucose management indicator:   6.8 %  Time in range (70-180 mg/dL):   86 %   (Goal >16%)  Time High (181-250 mg/dL):   14 %   (Goal < 10%)  Time Very High (>250 mg/dL):    0 %   (Goal < 5%)  Time Low (54-69 mg/dL):   0 %   (Goal <9%)  Time Very Low (<54 mg/dL):   0 %   (Goal <6%)  Coefficient of variation:   23.7 %   (Goal <36%)   Her profile is flat and in target. She feels the CGM was very helpful in assisteing her to make behavior changes  Norm Parcel, RD 03/28/2023 9:15 AM

## 2023-04-06 ENCOUNTER — Other Ambulatory Visit (HOSPITAL_COMMUNITY): Payer: Self-pay

## 2023-04-11 ENCOUNTER — Other Ambulatory Visit (HOSPITAL_COMMUNITY): Payer: Self-pay

## 2023-04-19 ENCOUNTER — Other Ambulatory Visit: Payer: Self-pay | Admitting: Dietician

## 2023-04-19 ENCOUNTER — Other Ambulatory Visit (HOSPITAL_COMMUNITY): Payer: Self-pay

## 2023-04-19 DIAGNOSIS — E119 Type 2 diabetes mellitus without complications: Secondary | ICD-10-CM

## 2023-04-19 MED ORDER — DEXCOM G7 SENSOR MISC
11 refills | Status: DC
  Filled 2023-04-19: qty 3, 30d supply, fill #0

## 2023-04-19 NOTE — Telephone Encounter (Signed)
Call to MS. Quain about using Continuous glucose monitoring, she would like a prescription for the Dexcom as it is preferred on her insurance (pr pharmacy cost if ~ 67.82$/month)

## 2023-04-23 ENCOUNTER — Other Ambulatory Visit (HOSPITAL_COMMUNITY): Payer: Self-pay

## 2023-04-25 NOTE — Telephone Encounter (Addendum)
Left message that CGM should be ready for pick up and for call back if patient would like to schedule a follow up.

## 2023-04-26 ENCOUNTER — Other Ambulatory Visit (HOSPITAL_COMMUNITY): Payer: Self-pay

## 2023-04-26 NOTE — Telephone Encounter (Signed)
Called Michele Andrews to follow up on her stated desire to use CGM. She was busy and said she'd call back.

## 2023-05-01 ENCOUNTER — Other Ambulatory Visit: Payer: Self-pay | Admitting: Dietician

## 2023-05-01 ENCOUNTER — Other Ambulatory Visit (HOSPITAL_COMMUNITY): Payer: Self-pay

## 2023-05-01 ENCOUNTER — Ambulatory Visit: Payer: 59 | Admitting: Dietician

## 2023-05-01 DIAGNOSIS — Z794 Long term (current) use of insulin: Secondary | ICD-10-CM

## 2023-05-01 NOTE — Progress Notes (Signed)
  Diabetes Self-Management Education  Visit Type: Follow-up  Appt. Start Time: 1415 Appt. End Time: 1440  05/01/2023  [name], identified by name and date of birth, is a 72 y.o. female with a diagnosis of Diabetes:  .   ASSESSMENT  Discussed that her Urine for microalb is due, she wished to defer it.  She would like a 90 day rx for CGM and a Dexcom receiver- (her copay is 55.46)  She is taking her medications as ordered. Having competing issues making it harder for her to fit activity in.    Diabetes Self-Management Education - 05/01/23 1400       Visit Information   Visit Type Follow-up      Health Coping   How would you rate your overall health? Good      Pre-Education Assessment   Patient understands monitoring blood glucose, interpreting and using results Needs Instruction      Patient Education   Monitoring Taught/evaluated CGM (comment)    Lifestyle and Health Coping Helped patient develop diabetes management plan for (enter comment)   using CGM for support and feedback to keep her on track caring for her diabetes     Patient Self-Evaluation of Goals - Patient rates self as meeting previously set goals (% of time)   Physical Activity 25 - 50% (sometimes)    Medications >75% (most of the time)    Monitoring 50 - 75 % (half of the time)      Post-Education Assessment   Patient understands how to develop strategies to promote health/change behavior. Comprehends key points      Outcomes   Expected Outcomes Demonstrated interest in learning. Expect positive outcomes    Future DMSE 2 wks    Program Status Not Completed   needs to learn to apply and use the Dexcom G7 CGM, she placed a Freestyle Libre today because that is the one her phone is compatible with     Subsequent Visit   Since your last visit have you continued or begun to take your medications as prescribed? Yes    Since your last visit have you had your blood pressure checked? No    Since your last  visit have you experienced any weight changes? --   we did not weigh today   Since your last visit, are you checking your blood glucose at least once a day? Yes             Individualized Plan for Diabetes Self-Management Training:   Learning Objective:  Patient will have a greater understanding of diabetes self-management. Patient education plan is to attend individual and/or group sessions per assessed needs and concerns.   Plan:   There are no Patient Instructions on file for this visit.  Expected Outcomes:  Demonstrated interest in learning. Expect positive outcomes  Education material provided: Diabetes Resources  If problems or questions, patient to contact team via:  Phone  Future DSME appointment: 2 wks Norm Parcel, RD 05/01/2023 3:47 PM.

## 2023-05-01 NOTE — Telephone Encounter (Signed)
Michele Andrews request 90 day prescription for her Dexcom sensors and the receiver.

## 2023-05-01 NOTE — Patient Instructions (Addendum)
Hi Michele Andrews,   I have requested a receiver and made an appointment for you on Tuesday, June 4 at 215.  Your current sensor should end on that day.   [name] [phone]

## 2023-05-03 ENCOUNTER — Other Ambulatory Visit (HOSPITAL_COMMUNITY): Payer: Self-pay

## 2023-05-03 MED ORDER — DEXCOM G7 SENSOR MISC
3 refills | Status: DC
  Filled 2023-05-03: qty 9, fill #0

## 2023-05-03 MED ORDER — FREESTYLE LIBRE 3 SENSOR MISC
11 refills | Status: AC
  Filled 2023-05-03 (×2): qty 2, 28d supply, fill #0

## 2023-05-03 MED ORDER — DEXCOM G7 RECEIVER DEVI
1 refills | Status: DC
  Filled 2023-05-03: qty 1, 90d supply, fill #0

## 2023-05-03 NOTE — Addendum Note (Signed)
Addended by: Baird Cancer on: 05/03/2023 10:25 AM   Modules accepted: Orders

## 2023-05-03 NOTE — Telephone Encounter (Signed)
After discussion with patient she prefers freestyle libre 3 sensors. Request to discontinue Dexcom and order Freestyle Libre 3 sensors.

## 2023-05-14 ENCOUNTER — Other Ambulatory Visit (HOSPITAL_COMMUNITY): Payer: Self-pay

## 2023-05-14 ENCOUNTER — Other Ambulatory Visit: Payer: Self-pay | Admitting: Internal Medicine

## 2023-05-14 DIAGNOSIS — E119 Type 2 diabetes mellitus without complications: Secondary | ICD-10-CM

## 2023-05-15 ENCOUNTER — Encounter: Payer: Self-pay | Admitting: Dietician

## 2023-05-15 ENCOUNTER — Other Ambulatory Visit (HOSPITAL_COMMUNITY): Payer: Self-pay

## 2023-05-15 MED ORDER — TRULICITY 4.5 MG/0.5ML ~~LOC~~ SOAJ
4.5000 mg | SUBCUTANEOUS | 2 refills | Status: DC
  Filled 2023-05-15: qty 2, 28d supply, fill #0
  Filled 2023-06-28: qty 2, 28d supply, fill #1
  Filled 2023-07-23: qty 2, 28d supply, fill #2

## 2023-05-15 MED ORDER — INSULIN GLARGINE-YFGN 100 UNIT/ML ~~LOC~~ SOPN
15.0000 [IU] | PEN_INJECTOR | Freq: Every day | SUBCUTANEOUS | 2 refills | Status: DC
  Filled 2023-05-15: qty 6, 40d supply, fill #0
  Filled 2023-06-28: qty 6, 40d supply, fill #1

## 2023-05-17 ENCOUNTER — Encounter: Payer: Self-pay | Admitting: Dietician

## 2023-05-17 ENCOUNTER — Other Ambulatory Visit (HOSPITAL_COMMUNITY): Payer: Self-pay

## 2023-05-18 ENCOUNTER — Other Ambulatory Visit (HOSPITAL_COMMUNITY): Payer: Self-pay

## 2023-05-21 ENCOUNTER — Other Ambulatory Visit (HOSPITAL_COMMUNITY): Payer: Self-pay

## 2023-05-21 ENCOUNTER — Other Ambulatory Visit: Payer: Self-pay | Admitting: Internal Medicine

## 2023-05-21 DIAGNOSIS — M1712 Unilateral primary osteoarthritis, left knee: Secondary | ICD-10-CM

## 2023-05-22 ENCOUNTER — Other Ambulatory Visit (HOSPITAL_COMMUNITY): Payer: Self-pay

## 2023-05-22 MED ORDER — DICLOFENAC SODIUM 1 % EX GEL
2.0000 g | Freq: Four times a day (QID) | CUTANEOUS | 0 refills | Status: DC
  Filled 2023-05-22: qty 300, 15d supply, fill #0

## 2023-05-23 ENCOUNTER — Other Ambulatory Visit (HOSPITAL_COMMUNITY): Payer: Self-pay

## 2023-05-28 ENCOUNTER — Other Ambulatory Visit: Payer: Self-pay | Admitting: Student

## 2023-05-28 ENCOUNTER — Other Ambulatory Visit (HOSPITAL_COMMUNITY): Payer: Self-pay

## 2023-05-29 ENCOUNTER — Other Ambulatory Visit (HOSPITAL_COMMUNITY): Payer: Self-pay

## 2023-05-29 MED ORDER — FREESTYLE LITE TEST VI STRP
ORAL_STRIP | 12 refills | Status: DC
  Filled 2023-05-29: qty 100, 50d supply, fill #0
  Filled 2023-11-16: qty 100, 50d supply, fill #1

## 2023-05-29 MED ORDER — FREESTYLE LANCETS MISC
1 refills | Status: DC
  Filled 2023-05-29: qty 100, 50d supply, fill #0
  Filled 2023-12-10: qty 100, 50d supply, fill #1

## 2023-06-08 ENCOUNTER — Other Ambulatory Visit (HOSPITAL_COMMUNITY): Payer: Self-pay

## 2023-06-28 ENCOUNTER — Other Ambulatory Visit (HOSPITAL_COMMUNITY): Payer: Self-pay

## 2023-07-18 ENCOUNTER — Other Ambulatory Visit (HOSPITAL_COMMUNITY): Payer: Self-pay

## 2023-07-18 ENCOUNTER — Other Ambulatory Visit: Payer: Self-pay | Admitting: Internal Medicine

## 2023-07-18 DIAGNOSIS — M25512 Pain in left shoulder: Secondary | ICD-10-CM

## 2023-07-18 NOTE — Telephone Encounter (Signed)
Next appt scheduled 8/20 with Dr Hassan Rowan.

## 2023-07-19 ENCOUNTER — Other Ambulatory Visit (HOSPITAL_COMMUNITY): Payer: Self-pay

## 2023-07-19 MED ORDER — IBUPROFEN 800 MG PO TABS
800.0000 mg | ORAL_TABLET | Freq: Three times a day (TID) | ORAL | 1 refills | Status: DC | PRN
Start: 2023-07-19 — End: 2023-11-20
  Filled 2023-07-19: qty 30, 10d supply, fill #0
  Filled 2023-10-15: qty 30, 10d supply, fill #1

## 2023-07-23 ENCOUNTER — Other Ambulatory Visit (HOSPITAL_COMMUNITY): Payer: Self-pay

## 2023-07-31 ENCOUNTER — Other Ambulatory Visit: Payer: Self-pay

## 2023-07-31 ENCOUNTER — Other Ambulatory Visit (HOSPITAL_COMMUNITY): Payer: Self-pay

## 2023-07-31 ENCOUNTER — Ambulatory Visit (INDEPENDENT_AMBULATORY_CARE_PROVIDER_SITE_OTHER): Payer: 59 | Admitting: Student

## 2023-07-31 VITALS — BP 121/72 | HR 105 | Temp 98.5°F | Ht 65.4 in | Wt 148.0 lb

## 2023-07-31 DIAGNOSIS — Z7985 Long-term (current) use of injectable non-insulin antidiabetic drugs: Secondary | ICD-10-CM

## 2023-07-31 DIAGNOSIS — Z7984 Long term (current) use of oral hypoglycemic drugs: Secondary | ICD-10-CM

## 2023-07-31 DIAGNOSIS — E119 Type 2 diabetes mellitus without complications: Secondary | ICD-10-CM | POA: Diagnosis not present

## 2023-07-31 DIAGNOSIS — Z794 Long term (current) use of insulin: Secondary | ICD-10-CM | POA: Diagnosis not present

## 2023-07-31 DIAGNOSIS — E785 Hyperlipidemia, unspecified: Secondary | ICD-10-CM

## 2023-07-31 DIAGNOSIS — Z0000 Encounter for general adult medical examination without abnormal findings: Secondary | ICD-10-CM

## 2023-07-31 DIAGNOSIS — I10 Essential (primary) hypertension: Secondary | ICD-10-CM

## 2023-07-31 DIAGNOSIS — E782 Mixed hyperlipidemia: Secondary | ICD-10-CM

## 2023-07-31 LAB — POCT GLYCOSYLATED HEMOGLOBIN (HGB A1C): Hemoglobin A1C: 6.9 % — AB (ref 4.0–5.6)

## 2023-07-31 LAB — GLUCOSE, CAPILLARY: Glucose-Capillary: 126 mg/dL — ABNORMAL HIGH (ref 70–99)

## 2023-07-31 MED ORDER — METFORMIN HCL 1000 MG PO TABS
1000.0000 mg | ORAL_TABLET | Freq: Two times a day (BID) | ORAL | 3 refills | Status: DC
Start: 2023-07-31 — End: 2024-08-19
  Filled 2023-07-31: qty 180, 90d supply, fill #0
  Filled 2023-11-05 (×2): qty 180, 90d supply, fill #1
  Filled 2024-02-01: qty 180, 90d supply, fill #2
  Filled 2024-05-27: qty 180, 90d supply, fill #3

## 2023-07-31 MED ORDER — LISINOPRIL 20 MG PO TABS
20.0000 mg | ORAL_TABLET | Freq: Every day | ORAL | 3 refills | Status: DC
Start: 2023-07-31 — End: 2024-10-23
  Filled 2023-07-31 – 2023-09-28 (×2): qty 90, 90d supply, fill #0
  Filled 2024-01-15: qty 90, 90d supply, fill #1
  Filled 2024-04-10: qty 90, 90d supply, fill #2
  Filled 2024-07-22: qty 90, 90d supply, fill #3

## 2023-07-31 MED ORDER — ATORVASTATIN CALCIUM 40 MG PO TABS
40.0000 mg | ORAL_TABLET | Freq: Every day | ORAL | 3 refills | Status: DC
Start: 2023-07-31 — End: 2024-09-17
  Filled 2023-07-31 – 2023-08-30 (×2): qty 90, 90d supply, fill #0
  Filled 2023-12-03: qty 90, 90d supply, fill #1
  Filled 2024-03-10: qty 90, 90d supply, fill #2
  Filled 2024-06-18: qty 90, 90d supply, fill #3

## 2023-07-31 MED ORDER — METOPROLOL SUCCINATE ER 25 MG PO TB24
25.0000 mg | ORAL_TABLET | Freq: Every day | ORAL | 3 refills | Status: AC
Start: 2023-07-31 — End: ?
  Filled 2023-07-31: qty 90, 90d supply, fill #0
  Filled 2023-11-20: qty 90, 90d supply, fill #1
  Filled 2024-03-10: qty 90, 90d supply, fill #2
  Filled 2024-04-14: qty 90, 90d supply, fill #3

## 2023-07-31 MED ORDER — TRULICITY 4.5 MG/0.5ML ~~LOC~~ SOAJ
4.5000 mg | SUBCUTANEOUS | 2 refills | Status: DC
  Filled 2023-07-31 – 2023-08-30 (×2): qty 2, 28d supply, fill #0
  Filled 2023-09-24: qty 2, 28d supply, fill #1
  Filled 2023-10-29: qty 2, 28d supply, fill #2

## 2023-07-31 MED ORDER — INSULIN GLARGINE-YFGN 100 UNIT/ML ~~LOC~~ SOPN
15.0000 [IU] | PEN_INJECTOR | Freq: Every day | SUBCUTANEOUS | 2 refills | Status: DC
Start: 2023-07-31 — End: 2023-11-15
  Filled 2023-07-31 – 2023-09-10 (×2): qty 6, 40d supply, fill #0
  Filled 2023-10-08 – 2023-10-12 (×3): qty 6, 40d supply, fill #1

## 2023-07-31 NOTE — Assessment & Plan Note (Signed)
On atorvastatin 40mg  daily. Denies any muscle pain.  -Cw current regimen

## 2023-07-31 NOTE — Assessment & Plan Note (Signed)
Hemoglobin A1c today is up to 6.3. She has been watching what she eats and has lost 10lbs since last year. She is currently taking Trulicity 4.5 mg weekly, metformin 1000 mg twice daily, and Semglee 15 units nightly.  -Continue with current regimen  -Ophthalmology exam on Tuesday  -Foot exam today unremarkable  -Urine microalbumin today  -BMP -lipid panel

## 2023-07-31 NOTE — Assessment & Plan Note (Signed)
Blood pressure today is at goal (121/72) denies symptoms of hypotension.  Cw lisinopril 20mg  and metoprolol 25mg  XL daily Urine microalbumin today  BMP today

## 2023-07-31 NOTE — Addendum Note (Signed)
Addended by: Bufford Spikes on: 07/31/2023 03:29 PM   Modules accepted: Orders

## 2023-07-31 NOTE — Patient Instructions (Addendum)
Thank you, Michele Andrews for allowing [place] to provide your care today.    Keep up with the good work! I will call you with the results of your tests.   I have ordered the following labs for you:  Lab Orders         Fecal occult blood, imunochemical         Glucose, capillary         Microalbumin / Creatinine Urine Ratio         BMP8+Anion Gap         Lipid Profile         POC Hbg A1C      I have ordered the following medication/changed the following medications:   Start the following medications: Meds ordered this encounter  Medications   metoprolol succinate (TOPROL-XL) 25 MG 24 hr tablet    Sig: Take 1 tablet (25 mg total) by mouth daily before breakfast.    Dispense:  90 tablet    Refill:  3   metFORMIN (GLUCOPHAGE) 1000 MG tablet    Sig: Take 1 tablet (1,000 mg total) by mouth 2 (two) times daily with a meal.    Dispense:  180 tablet    Refill:  3   lisinopril (ZESTRIL) 20 MG tablet    Sig: Take 1 tablet (20 mg total) by mouth daily.    Dispense:  90 tablet    Refill:  3   insulin glargine-yfgn (SEMGLEE, YFGN,) 100 UNIT/ML Pen    Sig: Inject 15 Units into the skin at bedtime.    Dispense:  6 mL    Refill:  2   Dulaglutide (TRULICITY) 4.5 MG/0.5ML SOPN    Sig: Inject 4.5 mg as directed once a week.    Dispense:  2 mL    Refill:  2   atorvastatin (LIPITOR) 40 MG tablet    Sig: Take 1 tablet (40 mg total) by mouth daily.    Dispense:  90 tablet    Refill:  3     Follow up: 3 months    Remember:   To follow up with Ophthalmology   Let us know how your appointment goes.   Should you have any questions or concerns or need to reach [place] please call the internal medicine clinic at [phone].     Manuela Neptune, MD  [facility]

## 2023-07-31 NOTE — Progress Notes (Signed)
Established Patient Office Visit  Subjective   Patient ID: Michele Andrews, female    DOB: Oct 01, 1951  Age: 72 y.o. MRN: [id number]  Chief Complaint  Patient presents with   Annual Exam    HPI  Coming in for her yearly exam. Please refer to the A&P portion of this note.   Review of Systems  Constitutional:  Positive for weight loss (intentional). Negative for chills and fever.  Respiratory:  Negative for cough and shortness of breath.   Cardiovascular:  Negative for chest pain, claudication and leg swelling.  Gastrointestinal:  Negative for abdominal pain, diarrhea, nausea and vomiting.  Genitourinary:  Negative for dysuria and urgency.  Musculoskeletal:  Negative for myalgias.  Neurological:  Negative for dizziness, weakness and headaches.  Endo/Heme/Allergies:  Negative for polydipsia.     Objective:     BP 121/72 (BP Location: Right Arm, Patient Position: Sitting, Cuff Size: Small)   Pulse (!) 105   Temp 98.5 F (36.9 C) (Oral)   Ht 5' 5.4" (1.661 m)   Wt 148 lb (67.1 kg)   SpO2 100%   BMI 24.33 kg/m  BP Readings from Last 3 Encounters:  07/31/23 121/72  02/14/23 (!) 155/96  12/07/22 (!) 140/93   Wt Readings from Last 3 Encounters:  07/31/23 148 lb (67.1 kg)  02/14/23 157 lb 14.4 oz (71.6 kg)  12/07/22 158 lb 8 oz (71.9 kg)      Physical Exam Constitutional:      General: She is not in acute distress.    Appearance: She is normal weight. She is not ill-appearing or toxic-appearing.  Cardiovascular:     Rate and Rhythm: Normal rate and regular rhythm.     Heart sounds: No murmur heard.    No friction rub. No gallop.  Pulmonary:     Effort: Pulmonary effort is normal. No tachypnea or accessory muscle usage.     Breath sounds: No transmitted upper airway sounds. No decreased breath sounds, wheezing or rhonchi.    Results for orders placed or performed in visit on 07/31/23  Glucose, capillary  Result Value Ref Range   Glucose-Capillary 126 (H) 70 - 99  mg/dL  POC Hbg W0J  Result Value Ref Range   Hemoglobin A1C 6.9 (A) 4.0 - 5.6 %   HbA1c POC (<> result, manual entry)     HbA1c, POC (prediabetic range)     HbA1c, POC (controlled diabetic range)      Last CBC Lab Results  Component Value Date   WBC 8.4 10/15/2019   HGB 12.4 10/15/2019   HCT 37.7 10/15/2019   MCV 92 10/15/2019   MCH 30.4 10/15/2019   RDW 12.9 10/15/2019   PLT 224 10/15/2019   Last metabolic panel Lab Results  Component Value Date   GLUCOSE 89 11/23/2022   NA 141 11/23/2022   K 4.4 11/23/2022   CL 102 11/23/2022   CO2 24 11/23/2022   BUN 10 11/23/2022   CREATININE 0.56 (L) 11/23/2022   EGFR 97 11/23/2022   CALCIUM 10.2 11/23/2022   PHOS 3.2 08/13/2015   PROT 8.6 (H) 05/27/2020   ALBUMIN 4.6 10/15/2019   LABGLOB 4.7 (H) 05/27/2020   AGRATIO 0.8 05/27/2020   BILITOT 0.4 10/15/2019   ALKPHOS 60 10/15/2019   AST 22 10/15/2019   ALT 20 10/15/2019   ANIONGAP 11 04/28/2020   Last lipids Lab Results  Component Value Date   CHOL 111 11/23/2022   HDL 47 11/23/2022   LDLCALC 43 11/23/2022  TRIG 115 11/23/2022   CHOLHDL 2.4 11/23/2022   Last hemoglobin A1c Lab Results  Component Value Date   HGBA1C 6.9 (A) 07/31/2023     The ASCVD Risk score (Arnett DK, et al., 2019) failed to calculate for the following reasons:   The valid total cholesterol range is 130 to 320 mg/dL    Assessment & Plan:   Problem List Items Addressed This Visit       Cardiovascular and Mediastinum   Essential hypertension    Blood pressure today is at goal (121/72) denies symptoms of hypotension.  Cw lisinopril 20mg  and metoprolol 25mg  XL daily Urine microalbumin today  BMP today       Relevant Medications   metoprolol succinate (TOPROL-XL) 25 MG 24 hr tablet   lisinopril (ZESTRIL) 20 MG tablet   atorvastatin (LIPITOR) 40 MG tablet     Endocrine   Diabetes mellitus (HCC) - Primary    Hemoglobin A1c today is up to 6.3. She has been watching what she eats and has  lost 10lbs since last year. She is currently taking Trulicity 4.5 mg weekly, metformin 1000 mg twice daily, and Semglee 15 units nightly.  -Continue with current regimen  -Ophthalmology exam on Tuesday  -Foot exam today unremarkable  -Urine microalbumin today  -BMP -lipid panel       Relevant Medications   metFORMIN (GLUCOPHAGE) 1000 MG tablet   lisinopril (ZESTRIL) 20 MG tablet   insulin glargine-yfgn (SEMGLEE, YFGN,) 100 UNIT/ML Pen   Dulaglutide (TRULICITY) 4.5 MG/0.5ML SOPN   atorvastatin (LIPITOR) 40 MG tablet   Other Relevant Orders   POC Hbg A1C (Completed)   Microalbumin / Creatinine Urine Ratio   BMP8+Anion Gap   Lipid Profile     Other   Hyperlipemia    On atorvastatin 40mg  daily. Denies any muscle pain.  -Cw current regimen      Relevant Medications   metoprolol succinate (TOPROL-XL) 25 MG 24 hr tablet   lisinopril (ZESTRIL) 20 MG tablet   atorvastatin (LIPITOR) 40 MG tablet   Preventative health care    FOBT kit given       Other Visit Diagnoses     Diabetes mellitus type 2, insulin dependent (HCC)       Relevant Medications   metFORMIN (GLUCOPHAGE) 1000 MG tablet   lisinopril (ZESTRIL) 20 MG tablet   insulin glargine-yfgn (SEMGLEE, YFGN,) 100 UNIT/ML Pen   Dulaglutide (TRULICITY) 4.5 MG/0.5ML SOPN   atorvastatin (LIPITOR) 40 MG tablet   Healthcare maintenance       Relevant Orders   Fecal occult blood, imunochemical      90-day supply refills for  atorvastatin, trulicity, semglee, zestril, metfromin, tropolol XL sent.   Return in about 3 months (around 10/31/2023).    Manuela Neptune, MD

## 2023-07-31 NOTE — Assessment & Plan Note (Signed)
FOBT kit given

## 2023-07-31 NOTE — Progress Notes (Signed)
Reviewed results with patient. Pt is to continue current regimen and fu in 3 months

## 2023-08-01 ENCOUNTER — Other Ambulatory Visit (HOSPITAL_COMMUNITY): Payer: Self-pay

## 2023-08-01 ENCOUNTER — Other Ambulatory Visit: Payer: Self-pay | Admitting: Internal Medicine

## 2023-08-01 ENCOUNTER — Encounter: Payer: 59 | Admitting: Internal Medicine

## 2023-08-01 ENCOUNTER — Other Ambulatory Visit: Payer: 59

## 2023-08-01 DIAGNOSIS — E119 Type 2 diabetes mellitus without complications: Secondary | ICD-10-CM

## 2023-08-01 DIAGNOSIS — M1712 Unilateral primary osteoarthritis, left knee: Secondary | ICD-10-CM

## 2023-08-01 DIAGNOSIS — Z794 Long term (current) use of insulin: Secondary | ICD-10-CM | POA: Diagnosis not present

## 2023-08-01 MED ORDER — DICLOFENAC SODIUM 1 % EX GEL
2.0000 g | Freq: Four times a day (QID) | CUTANEOUS | 0 refills | Status: DC
  Filled 2023-08-01: qty 300, 30d supply, fill #0

## 2023-08-02 LAB — MICROALBUMIN / CREATININE URINE RATIO
Creatinine, Urine: 89.6 mg/dL
Microalb/Creat Ratio: 10 mg/g{creat} (ref 0–29)
Microalbumin, Urine: 8.8 ug/mL

## 2023-08-02 NOTE — Progress Notes (Signed)
Internal Medicine Clinic Attending  I was physically present during the key portions of the resident provided service and participated in the medical decision making of patient's management care. I reviewed pertinent patient test results.  The assessment, diagnosis, and plan were formulated together and I agree with the documentation in the resident's note.  Guilloud, Carolyn, MD  

## 2023-08-03 LAB — BMP8+ANION GAP
Anion Gap: 18 mmol/L (ref 10.0–18.0)
BUN/Creatinine Ratio: 12 (ref 12–28)
BUN: 9 mg/dL (ref 8–27)
CO2: 23 mmol/L (ref 20–29)
Calcium: 10.2 mg/dL (ref 8.7–10.3)
Chloride: 100 mmol/L (ref 96–106)
Creatinine, Ser: 0.77 mg/dL (ref 0.57–1.00)
Glucose: 133 mg/dL — ABNORMAL HIGH (ref 70–99)
Potassium: 3.8 mmol/L (ref 3.5–5.2)
Sodium: 141 mmol/L (ref 134–144)
eGFR: 81 mL/min/{1.73_m2} (ref >59)

## 2023-08-03 LAB — LIPID PANEL
Chol/HDL Ratio: 2.5 ratio (ref 0.0–4.4)
Cholesterol, Total: 129 mg/dL (ref 100–199)
HDL: 52 mg/dL (ref >39)
LDL Chol Calc (NIH): 60 mg/dL (ref 0–99)
Triglycerides: 88 mg/dL (ref 0–149)
VLDL Cholesterol Cal: 17 mg/dL (ref 5–40)

## 2023-08-06 NOTE — Progress Notes (Signed)
Urine Microalbumin/Cr ratio WNL.

## 2023-08-06 NOTE — Progress Notes (Signed)
CMP unremarkable. Lipid panel slightly worse than last time but LDL still remains <70.

## 2023-08-07 DIAGNOSIS — E113292 Type 2 diabetes mellitus with mild nonproliferative diabetic retinopathy without macular edema, left eye: Secondary | ICD-10-CM | POA: Diagnosis not present

## 2023-08-07 DIAGNOSIS — E113211 Type 2 diabetes mellitus with mild nonproliferative diabetic retinopathy with macular edema, right eye: Secondary | ICD-10-CM | POA: Diagnosis not present

## 2023-08-07 DIAGNOSIS — Z961 Presence of intraocular lens: Secondary | ICD-10-CM | POA: Diagnosis not present

## 2023-08-07 LAB — HM DIABETES EYE EXAM

## 2023-08-09 ENCOUNTER — Other Ambulatory Visit: Payer: 59

## 2023-08-09 DIAGNOSIS — Z0000 Encounter for general adult medical examination without abnormal findings: Secondary | ICD-10-CM | POA: Diagnosis not present

## 2023-08-09 NOTE — Progress Notes (Signed)
Attempted to contact patient x 2. LVM and My Chart message.

## 2023-08-11 LAB — FECAL OCCULT BLOOD, IMMUNOCHEMICAL: Fecal Occult Bld: NEGATIVE

## 2023-08-30 ENCOUNTER — Other Ambulatory Visit: Payer: Self-pay | Admitting: Internal Medicine

## 2023-08-30 ENCOUNTER — Other Ambulatory Visit (HOSPITAL_COMMUNITY): Payer: Self-pay

## 2023-08-30 ENCOUNTER — Encounter (HOSPITAL_COMMUNITY): Payer: Self-pay

## 2023-08-30 DIAGNOSIS — E119 Type 2 diabetes mellitus without complications: Secondary | ICD-10-CM

## 2023-09-05 ENCOUNTER — Encounter: Payer: Self-pay | Admitting: Dietician

## 2023-09-10 ENCOUNTER — Other Ambulatory Visit (HOSPITAL_COMMUNITY): Payer: Self-pay

## 2023-09-24 ENCOUNTER — Other Ambulatory Visit (HOSPITAL_COMMUNITY): Payer: Self-pay

## 2023-09-28 ENCOUNTER — Other Ambulatory Visit (HOSPITAL_COMMUNITY): Payer: Self-pay

## 2023-10-08 ENCOUNTER — Other Ambulatory Visit (HOSPITAL_COMMUNITY): Payer: Self-pay

## 2023-10-10 ENCOUNTER — Other Ambulatory Visit (HOSPITAL_COMMUNITY): Payer: Self-pay

## 2023-10-12 ENCOUNTER — Other Ambulatory Visit (HOSPITAL_COMMUNITY): Payer: Self-pay

## 2023-10-15 ENCOUNTER — Other Ambulatory Visit (HOSPITAL_COMMUNITY): Payer: Self-pay

## 2023-10-19 ENCOUNTER — Other Ambulatory Visit (HOSPITAL_COMMUNITY): Payer: Self-pay

## 2023-10-19 ENCOUNTER — Other Ambulatory Visit: Payer: Self-pay | Admitting: Internal Medicine

## 2023-10-19 DIAGNOSIS — M1712 Unilateral primary osteoarthritis, left knee: Secondary | ICD-10-CM

## 2023-10-19 DIAGNOSIS — E119 Type 2 diabetes mellitus without complications: Secondary | ICD-10-CM

## 2023-10-19 MED ORDER — DICLOFENAC SODIUM 1 % EX GEL
2.0000 g | Freq: Four times a day (QID) | CUTANEOUS | 0 refills | Status: AC
Start: 2023-10-19 — End: ?
  Filled 2023-10-19: qty 300, 30d supply, fill #0

## 2023-10-19 NOTE — Telephone Encounter (Signed)
Please have patient schedule follow up with me. Thanks.  

## 2023-10-29 ENCOUNTER — Other Ambulatory Visit: Payer: Self-pay

## 2023-10-29 ENCOUNTER — Other Ambulatory Visit (HOSPITAL_COMMUNITY): Payer: Self-pay

## 2023-11-05 ENCOUNTER — Other Ambulatory Visit (HOSPITAL_COMMUNITY): Payer: Self-pay

## 2023-11-15 ENCOUNTER — Ambulatory Visit (INDEPENDENT_AMBULATORY_CARE_PROVIDER_SITE_OTHER): Payer: 59 | Admitting: Student

## 2023-11-15 ENCOUNTER — Encounter: Payer: Self-pay | Admitting: Student

## 2023-11-15 ENCOUNTER — Other Ambulatory Visit (HOSPITAL_COMMUNITY): Payer: Self-pay

## 2023-11-15 VITALS — BP 143/90 | HR 94 | Temp 98.4°F | Ht 64.5 in | Wt 149.8 lb

## 2023-11-15 DIAGNOSIS — Z7984 Long term (current) use of oral hypoglycemic drugs: Secondary | ICD-10-CM | POA: Diagnosis not present

## 2023-11-15 DIAGNOSIS — Z794 Long term (current) use of insulin: Secondary | ICD-10-CM | POA: Diagnosis not present

## 2023-11-15 DIAGNOSIS — I10 Essential (primary) hypertension: Secondary | ICD-10-CM

## 2023-11-15 DIAGNOSIS — E119 Type 2 diabetes mellitus without complications: Secondary | ICD-10-CM | POA: Diagnosis not present

## 2023-11-15 DIAGNOSIS — E118 Type 2 diabetes mellitus with unspecified complications: Secondary | ICD-10-CM

## 2023-11-15 LAB — POCT GLYCOSYLATED HEMOGLOBIN (HGB A1C): Hemoglobin A1C: 6.3 % — AB (ref 4.0–5.6)

## 2023-11-15 LAB — GLUCOSE, CAPILLARY: Glucose-Capillary: 78 mg/dL (ref 70–99)

## 2023-11-15 MED ORDER — INSULIN GLARGINE-YFGN 100 UNIT/ML ~~LOC~~ SOPN
12.0000 [IU] | PEN_INJECTOR | Freq: Every day | SUBCUTANEOUS | 2 refills | Status: DC
  Filled 2023-11-15: qty 6, 50d supply, fill #0
  Filled 2024-01-02: qty 6, 50d supply, fill #1

## 2023-11-15 MED ORDER — AMLODIPINE BESYLATE 10 MG PO TABS
10.0000 mg | ORAL_TABLET | Freq: Every day | ORAL | 11 refills | Status: DC
  Filled 2023-11-15: qty 30, 30d supply, fill #0
  Filled 2023-12-10: qty 30, 30d supply, fill #1
  Filled 2024-01-15: qty 30, 30d supply, fill #2
  Filled 2024-02-13: qty 30, 30d supply, fill #3
  Filled 2024-03-10: qty 30, 30d supply, fill #4
  Filled 2024-05-01: qty 30, 30d supply, fill #5
  Filled 2024-05-27: qty 30, 30d supply, fill #6
  Filled 2024-07-01: qty 30, 30d supply, fill #7
  Filled 2024-08-06: qty 30, 30d supply, fill #8
  Filled 2024-09-02: qty 30, 30d supply, fill #9
  Filled 2024-10-01: qty 30, 30d supply, fill #10
  Filled 2024-11-03: qty 30, 30d supply, fill #11

## 2023-11-15 NOTE — Patient Instructions (Addendum)
Thank you, [name] for allowing [place] to provide your care today. Today we discussed   Diabetes: Congratulations for your excellent control.  Please continue with the Trulicity 4.5 mg weekly.  You can now decrease your daily dose of insulin to 12 units daily -Please bring your glucometer and glucose readings to the next office visit  High blood pressure Your blood pressure was elevated today. -Please continue the current medications and we will add a new medication to your regimen.  Pick up amlodipine 10 mg daily -This medication may cause constipation and mild lower leg swelling.  Noted and let us know if you experience it - Come back in a month for nurse blood pressure check -And keep a log yourself following the instructions below BLOOD PRESSURE  Please check your blood pressure daily following the instructions below AND bring your readings to the next outpatient visit  Blood pressure tips   It is best to check your BP 1-2 hours after taking your medications to see the medications effectiveness on your BP.    Here are some tips that our clinical pharmacists share for home BP monitoring:          Rest 10 minutes before taking your blood pressure.          Don't smoke or drink caffeinated beverages for at least 30 minutes before.          Take your blood pressure before (not after) you eat.          Sit comfortably with your back supported and both feet on the floor (don't cross your legs).          Elevate your arm to heart level on a table or a desk.          Use the proper sized cuff. It should fit smoothly and snugly around your bare upper arm. There should be enough room to slip a fingertip under the cuff. The bottom edge of the cuff should be 1 inch above the crease of the elbow.     Take ALL your medications before you return to clinic   Referrals: -DEXA scan for bone imaging: Please call this number to schedule your DEXA scan [phone]   I have ordered the  following labs for you:   Lab Orders         Glucose, capillary         POC Hbg A1C      I will call if any are abnormal. All of your labs can be accessed through "My Chart".   My Chart Access: [email or web address]  Please follow-up in: 1 month for a nurse check of blood pressure and in 3 months for Blood pressure and diabetes follow up    We look forward to seeing you next time. Please call our clinic at [phone] if you have any questions or concerns. The best time to call is Monday-Friday from 9am-4pm, but there is someone available 24/7. If after hours or the weekend, call the main hospital number and ask for the Internal Medicine Resident On-Call. If you need medication refills, please notify your pharmacy one week in advance and they will send [place] a request.   Thank you for letting us take part in your care. Wishing you the best!  Michele Crocker, MD 11/15/2023, 2:49 PM [facility]

## 2023-11-15 NOTE — Progress Notes (Signed)
Subjective:  CC: Hypertension and diabetes follow up  HPI:  [name] is a 72 y.o. female with a past medical history stated below and presents today for hypertension and diabetes follow up. Please see problem based assessment and plan for additional details.  Past Medical History:  Diagnosis Date   Carpal tunnel syndrome 03/30/2010   Diabetes mellitus    Type II- diagnosed 30 yrs. ago   Dry eye 11/09/2015   Family history of adverse reaction to anesthesia    20 YRS. AGO, HER 19 YR. OLD NEPHEW, ENDED UP ON VENTILATOR- PT. THINKS ITS BECAUSE HE ASPIRATED    Hypercalcemia    Hyperlipidemia    Hypertension    Osteoarthritis of left knee 02/23/2016   Osteopenia 09/20/1999   Rotator cuff syndrome of right shoulder    Vitamin D insufficiency 04/12/2015    Current Outpatient Medications on File Prior to Visit  Medication Sig Dispense Refill   acetaminophen (TYLENOL) 650 MG CR tablet Take 650 mg by mouth every 8 (eight) hours as needed for pain.     atorvastatin (LIPITOR) 40 MG tablet Take 1 tablet (40 mg total) by mouth daily. 90 tablet 3   Blood Glucose Monitoring Suppl (FREESTYLE FREEDOM LITE) w/Device KIT 1 each by Does not apply route 3 (three) times daily before meals. 1 kit 0   Blood Glucose Monitoring Suppl (TRUETRACK BLOOD GLUCOSE) W/DEVICE KIT Use to check blood sugar 2 times a day      Cholecalciferol (VITAMIN D3) 2000 units TABS Take 2,000 Units by mouth daily.     Continuous Glucose Sensor (FREESTYLE LIBRE 3 SENSOR) MISC Place 1 sensor on the skin every 14 days. Use to check glucose continuously 2 each 11   diclofenac Sodium (VOLTAREN) 1 % GEL Apply 2 grams topically 4 (four) times daily. 300 g 0   diclofenac Sodium (VOLTAREN) 1 % GEL Apply 2 g topically 4 (four) times daily. 300 g 0   Dulaglutide (TRULICITY) 4.5 MG/0.5ML SOAJ Inject 4.5 mg as directed once a week. 2 mL 2   glucose blood (FREESTYLE LITE) test strip Use to check blood sugar 2 times a day. 100  each 12   hydroxypropyl methylcellulose / hypromellose (ISOPTO TEARS / GONIOVISC) 2.5 % ophthalmic solution Place 1-2 drops into both eyes 3 (three) times daily as needed for dry eyes.     ibuprofen (ADVIL) 800 MG tablet Take 1 tablet (800 mg total) by mouth every 8 (eight) hours as needed for pain 30 tablet 1   Lancets (FREESTYLE) lancets Use 2 times daily to check blood sugar 300 each 1   lisinopril (ZESTRIL) 20 MG tablet Take 1 tablet (20 mg total) by mouth daily. 90 tablet 3   metFORMIN (GLUCOPHAGE) 1000 MG tablet Take 1 tablet (1,000 mg total) by mouth 2 (two) times daily with a meal. 180 tablet 3   metoprolol succinate (TOPROL-XL) 25 MG 24 hr tablet Take 1 tablet (25 mg total) by mouth daily before breakfast. 90 tablet 3   [DISCONTINUED] insulin glargine (SEMGLEE) 100 UNIT/ML injection Inject 0.18 mLs (18 Units total) into the skin at bedtime. 10 mL 11   [DISCONTINUED] sitaGLIPtin (JANUVIA) 100 MG tablet TAKE 1 TABLET (100 MG TOTAL) BY MOUTH DAILY. 90 tablet 1   No current facility-administered medications on file prior to visit.    Family History  Problem Relation Age of Onset   Hypertension Mother    Cancer Mother    Breast cancer Mother 90   Breast  cancer Maternal Aunt 73   Breast cancer Cousin 46    Social History   Socioeconomic History   Marital status: Married    Spouse name: Not on file   Number of children: Not on file   Years of education: 12   Highest education level: Not on file  Occupational History   Occupation: Curator: [employer]  Tobacco Use   Smoking status: Never   Smokeless tobacco: Never  Substance and Sexual Activity   Alcohol use: No   Drug use: No   Sexual activity: Not on file  Other Topics Concern   Not on file  Social History Narrative   Not on file   Social Determinants of Health   Financial Resource Strain: Not on file  Food Insecurity: No Food Insecurity (11/23/2022)   Hunger Vital Sign    Worried About Running Out  of Food in the Last Year: Never true    Ran Out of Food in the Last Year: Never true  Transportation Needs: Unknown (11/23/2022)   PRAPARE - Administrator, Civil Service (Medical): No    Lack of Transportation (Non-Medical): Not on file  Physical Activity: Not on file  Stress: Not on file  Social Connections: Moderately Integrated (11/23/2022)   Social Connection and Isolation Panel [NHANES]    Frequency of Communication with Friends and Family: More than three times a week    Frequency of Social Gatherings with Friends and Family: More than three times a week    Attends Religious Services: More than 4 times per year    Active Member of [employer] or Organizations: No    Attends Banker Meetings: Never    Marital Status: Married  Catering manager Violence: Not At Risk (11/23/2022)   Humiliation, Afraid, Rape, and Kick questionnaire    Fear of Current or Ex-Partner: No    Emotionally Abused: No    Physically Abused: No    Sexually Abused: No    Review of Systems: ROS negative except for what is noted on the assessment and plan.  Objective:   Vitals:   11/15/23 1423  BP: (!) 143/90  Pulse: 94  Temp: 98.4 F (36.9 C)  TempSrc: Oral  SpO2: 100%  Weight: 149 lb 12.8 oz (67.9 kg)  Height: 5' 4.5" (1.638 m)    Physical Exam: Constitutional: well-appearing woman sitting in chair, in no acute distress HENT: normocephalic atraumatic, mucous membranes moist Eyes: conjunctiva non-erythematous Neck: supple Cardiovascular: regular rate and rhythm, no m/r/g Pulmonary/Chest: normal work of breathing on room air, lungs clear to auscultation bilaterally Abdominal: soft, non-tender, non-distended MSK: normal bulk and tone Neurological: alert & oriented x 3, normal gait Skin: warm and dry Psych: Pleasant mood and affect       07/31/2023    2:25 PM  Depression screen PHQ 2/9  Decreased Interest 0  Down, Depressed, Hopeless 0  PHQ - 2 Score 0     Assessment  & Plan:   Essential hypertension Blood pressure not at goal today. Initial blood pressure 143/90. Repeat blood pressure 163/90. Denies HA, lightheadedness, chest pain, SHOB, changes in vision. She has been adherent to Toprol XL 25 mg daily and Lisinopril 20 mg. Both chronic medications for her without recent changes. After discussing with patient, made share decision of starting amlodipine 10 mg dialy and continuing previous regimen. -RTC for 1 mont RN BP check  Type 2 diabetes mellitus treated with insulin (HCC) A1c  in August  6.9. Repeat today 6.3. Patient is on a statin and LDL <70 last checked the past 6 months. No microalbuminuria. Blood pressure is currently being managed. Foot exam is up to date. Discussed lifestyle modifications as she has found herself snacking more. Reviewed weight trend and no major changes noted. She is currently on Semglee 15 units daily, Metformin 1g BID, and Mounjaro 10 mg weekly without GI side effects. Fasting blood glucose around 130s. No episodes of reported hypoglycemia episodes or readings on glucometer. Last seen by ophthalmologist in August; resolved macular edema in OD and mild diabetic retinopathy. No claudication or neuropathy symptoms today Plan -Decrease Semglee to 12 units -Continue Metformin and Mounjaro without changes    Return in about 3 months (around 02/13/2024) for and RN check of BP in 4 weeks and in 3 months for diabetes.  Patient discussed with Dr. Rosalia Hammers, MD [facility]

## 2023-11-15 NOTE — Assessment & Plan Note (Addendum)
Blood pressure not at goal today. Initial blood pressure 143/90. Repeat blood pressure 163/90. Denies HA, lightheadedness, chest pain, SHOB, changes in vision. She has been adherent to Toprol XL 25 mg daily and Lisinopril 20 mg. Both chronic medications for her without recent changes. After discussing with patient, made share decision of starting amlodipine 10 mg dialy and continuing previous regimen. -RTC for 1 mont RN BP check

## 2023-11-16 ENCOUNTER — Other Ambulatory Visit (HOSPITAL_COMMUNITY): Payer: Self-pay

## 2023-11-16 NOTE — Progress Notes (Signed)
 Internal Medicine Clinic Attending  Case discussed with the resident physician at the time of the visit.  We reviewed the patient's history, exam, and pertinent patient test results.  I agree with the assessment, diagnosis, and plan of care documented in the resident's note.

## 2023-11-16 NOTE — Assessment & Plan Note (Signed)
A1c  in August 6.9. Repeat today 6.3. Patient is on a statin and LDL <70 last checked the past 6 months. No microalbuminuria. Blood pressure is currently being managed. Foot exam is up to date. Discussed lifestyle modifications as she has found herself snacking more. Reviewed weight trend and no major changes noted. She is currently on Semglee 15 units daily, Metformin 1g BID, and Mounjaro 10 mg weekly without GI side effects. Fasting blood glucose around 130s. No episodes of reported hypoglycemia episodes or readings on glucometer. Last seen by ophthalmologist in August; resolved macular edema in OD and mild diabetic retinopathy. No claudication or neuropathy symptoms today Plan -Decrease Semglee to 12 units -Continue Metformin and Mounjaro without changes

## 2023-11-20 ENCOUNTER — Other Ambulatory Visit: Payer: Self-pay | Admitting: Internal Medicine

## 2023-11-20 ENCOUNTER — Other Ambulatory Visit (HOSPITAL_COMMUNITY): Payer: Self-pay

## 2023-11-20 DIAGNOSIS — M25512 Pain in left shoulder: Secondary | ICD-10-CM

## 2023-11-20 MED ORDER — IBUPROFEN 800 MG PO TABS
800.0000 mg | ORAL_TABLET | Freq: Three times a day (TID) | ORAL | 1 refills | Status: DC | PRN
  Filled 2023-11-20: qty 30, 10d supply, fill #0
  Filled 2024-02-01: qty 30, 10d supply, fill #1

## 2023-11-22 ENCOUNTER — Other Ambulatory Visit (HOSPITAL_COMMUNITY): Payer: Self-pay

## 2023-11-22 ENCOUNTER — Other Ambulatory Visit: Payer: Self-pay | Admitting: Student

## 2023-11-22 DIAGNOSIS — E119 Type 2 diabetes mellitus without complications: Secondary | ICD-10-CM

## 2023-11-22 MED ORDER — TRULICITY 4.5 MG/0.5ML ~~LOC~~ SOAJ
4.5000 mg | SUBCUTANEOUS | 2 refills | Status: DC
  Filled 2023-11-22: qty 2, 28d supply, fill #0
  Filled 2023-12-28: qty 2, 28d supply, fill #1
  Filled 2024-02-01: qty 2, 28d supply, fill #2

## 2023-11-23 ENCOUNTER — Other Ambulatory Visit (HOSPITAL_COMMUNITY): Payer: Self-pay

## 2023-12-10 ENCOUNTER — Other Ambulatory Visit: Payer: Self-pay | Admitting: *Deleted

## 2023-12-10 ENCOUNTER — Other Ambulatory Visit (HOSPITAL_COMMUNITY): Payer: Self-pay

## 2023-12-10 NOTE — Progress Notes (Deleted)
Patient called requesting refill on her pen needs 31 gauge  by 5mm .  Has been previously getting samples for.  Needs new prescription sent to [facility]

## 2023-12-10 NOTE — Progress Notes (Unsigned)
Patient called requesting refill on her pen needs 31 gauge  by 5mm .  Has been previously getting samples for.  Needs new prescription sent to [facility]

## 2023-12-13 ENCOUNTER — Other Ambulatory Visit (HOSPITAL_COMMUNITY): Payer: Self-pay

## 2023-12-14 ENCOUNTER — Other Ambulatory Visit: Payer: Self-pay | Admitting: Student

## 2023-12-14 ENCOUNTER — Other Ambulatory Visit (HOSPITAL_COMMUNITY): Payer: Self-pay

## 2023-12-14 DIAGNOSIS — E119 Type 2 diabetes mellitus without complications: Secondary | ICD-10-CM

## 2023-12-17 ENCOUNTER — Ambulatory Visit: Payer: 59

## 2023-12-18 ENCOUNTER — Other Ambulatory Visit (HOSPITAL_COMMUNITY): Payer: Self-pay

## 2023-12-18 ENCOUNTER — Ambulatory Visit: Payer: 59

## 2023-12-18 MED ORDER — UNIFINE PENTIPS 31G X 5 MM MISC
Freq: Three times a day (TID) | 5 refills | Status: DC
  Filled 2023-12-18: qty 100, 30d supply, fill #0
  Filled 2024-02-01: qty 100, 30d supply, fill #1

## 2023-12-19 ENCOUNTER — Other Ambulatory Visit (HOSPITAL_COMMUNITY): Payer: Self-pay

## 2023-12-19 ENCOUNTER — Ambulatory Visit: Payer: 59

## 2023-12-26 ENCOUNTER — Ambulatory Visit: Payer: 59 | Admitting: *Deleted

## 2023-12-26 NOTE — Progress Notes (Signed)
    Michele Andrews presented today for blood pressure check. Patient is prescribed blood pressure medications and I confirmed that patient did take their blood pressure medication prior to today's appointment. Blood pressure was taken in the usual and appropriate manner using an automated BP cuff.     Vitals:   12/26/23 1105  BP: 126/79      Results of today's visit will be routed to Dr. Antony Contras for review and further management.

## 2023-12-28 ENCOUNTER — Other Ambulatory Visit (HOSPITAL_COMMUNITY): Payer: Self-pay

## 2024-01-02 ENCOUNTER — Other Ambulatory Visit (HOSPITAL_COMMUNITY): Payer: Self-pay

## 2024-01-15 ENCOUNTER — Other Ambulatory Visit (HOSPITAL_COMMUNITY): Payer: Self-pay

## 2024-01-15 ENCOUNTER — Encounter: Payer: 59 | Admitting: Student

## 2024-01-15 NOTE — Progress Notes (Deleted)
 CC: ***  HPI:  [name] is a 73 y.o. female with a past medical history of hypertension, GERD, type 2 diabetes mellitus, primary hyperparathyroidism who presents for hand pain.  Please see assessment and plan for full HPI.  Medications: Tylenol  650 mg every 8 hours as needed Hypertension: Amlodipine  10 mg daily, lisinopril  20 mg daily, metoprolol  succinate 25 mg daily Hyperlipidemia 40 mg daily Type 2 diabetes mellitus: Trulicity  4.5 mg weekly, glargine 12 units nightly, metformin  1000 mg twice daily  Patient was last seen on 12/5 for diabetes follow-up.  At that time patient had A1c of 6.3.  Patient was decreased to 12 units of Semglee .  Patient to continue taking Mounjaro and metformin .  Patient also seen for hypertension and it looks like patient has been adherent to her metoprolol  succinate as well as lisinopril .  She was started on amlodipine  10.  BMP on 07/31/2023: Creatinine 0.77, potassium 3.8, sodium 141 Lipids: Total cholesterol 129, triglycerides 88, LDL 60  Past Medical History:  Diagnosis Date   Carpal tunnel syndrome 03/30/2010   Diabetes mellitus    Type II- diagnosed 30 yrs. ago   Dry eye 11/09/2015   Family history of adverse reaction to anesthesia    20 YRS. AGO, HER 19 YR. OLD NEPHEW, ENDED UP ON VENTILATOR- PT. THINKS ITS BECAUSE HE ASPIRATED    Hypercalcemia    Hyperlipidemia    Hypertension    Osteoarthritis of left knee 02/23/2016   Osteopenia 09/20/1999   Rotator cuff syndrome of right shoulder    Vitamin D  insufficiency 04/12/2015     Current Outpatient Medications:    acetaminophen  (TYLENOL ) 650 MG CR tablet, Take 650 mg by mouth every 8 (eight) hours as needed for pain., Disp: , Rfl:    amLODipine  (NORVASC ) 10 MG tablet, Take 1 tablet (10 mg total) by mouth daily., Disp: 30 tablet, Rfl: 11   atorvastatin  (LIPITOR) 40 MG tablet, Take 1 tablet (40 mg total) by mouth daily., Disp: 90 tablet, Rfl: 3   Blood Glucose Monitoring Suppl  (FREESTYLE FREEDOM LITE) w/Device KIT, 1 each by Does not apply route 3 (three) times daily before meals., Disp: 1 kit, Rfl: 0   Blood Glucose Monitoring Suppl (TRUETRACK BLOOD GLUCOSE) W/DEVICE KIT, Use to check blood sugar 2 times a day , Disp: , Rfl:    Cholecalciferol (VITAMIN D3) 2000 units TABS, Take 2,000 Units by mouth daily., Disp: , Rfl:    Continuous Glucose Sensor (FREESTYLE LIBRE 3 SENSOR) MISC, Place 1 sensor on the skin every 14 days. Use to check glucose continuously, Disp: 2 each, Rfl: 11   diclofenac  Sodium (VOLTAREN ) 1 % GEL, Apply 2 grams topically 4 (four) times daily., Disp: 300 g, Rfl: 0   diclofenac  Sodium (VOLTAREN ) 1 % GEL, Apply 2 g topically 4 (four) times daily., Disp: 300 g, Rfl: 0   Dulaglutide  (TRULICITY ) 4.5 MG/0.5ML SOAJ, Inject 4.5 mg as directed once a week., Disp: 2 mL, Rfl: 2   glucose blood (FREESTYLE LITE) test strip, Use to check blood sugar 2 times a day., Disp: 100 each, Rfl: 12   hydroxypropyl methylcellulose / hypromellose (ISOPTO TEARS / GONIOVISC) 2.5 % ophthalmic solution, Place 1-2 drops into both eyes 3 (three) times daily as needed for dry eyes., Disp: , Rfl:    ibuprofen  (ADVIL ) 800 MG tablet, Take 1 tablet (800 mg total) by mouth every 8 (eight) hours as needed for pain, Disp: 30 tablet, Rfl: 1   insulin  glargine-yfgn (SEMGLEE , YFGN,) 100 UNIT/ML  Pen, Inject 12 Units into the skin at bedtime., Disp: 6 mL, Rfl: 2   Insulin  Pen Needle (UNIFINE PENTIPS) 31G X 5 MM MISC, USE AS DIRECTED 3 TIMES DAILY, Disp: 100 each, Rfl: 5   Lancets (FREESTYLE) lancets, Use 2 times daily to check blood sugar, Disp: 300 each, Rfl: 1   lisinopril  (ZESTRIL ) 20 MG tablet, Take 1 tablet (20 mg total) by mouth daily., Disp: 90 tablet, Rfl: 3   metFORMIN  (GLUCOPHAGE ) 1000 MG tablet, Take 1 tablet (1,000 mg total) by mouth 2 (two) times daily with a meal., Disp: 180 tablet, Rfl: 3   metoprolol  succinate (TOPROL -XL) 25 MG 24 hr tablet, Take 1 tablet (25 mg total) by mouth daily  before breakfast., Disp: 90 tablet, Rfl: 3  Review of Systems:  ***  Constitutional: Eye: Respiratory: Cardiovascular: GI: MSK: GU: Skin: Neuro: Endocrine:   Physical Exam:  There were no vitals filed for this visit. *** General: Patient is sitting comfortably in the room  Eyes: Pupils equal and reactive to light, EOM intact  Head: Normocephalic, atraumatic  Neck: Supple, nontender, full range of motion, No JVD Cardio: Regular rate and rhythm, no murmurs, rubs or gallops. 2+ pulses to bilateral upper and lower extremities  Chest: No chest tenderness Pulmonary: Clear to ausculation bilaterally with no rales, rhonchi, and crackles  Abdomen: Soft, nontender with normoactive bowel sounds with no rebound or guarding  Neuro: Alert and orientated x3. CN II-XII intact. Sensation intact to upper and lower extremities. 2+ patellar reflex.  Back: No midline tenderness, no step off or deformities noted. No paraspinal muscle tenderness.  Skin: No rashes noted  MSK: 5/5 strength to upper and lower extremities.    Assessment & Plan:   No problem-specific Assessment & Plan notes found for this encounter.    Patient {GC/GE:[id number]::discussed with,seen with} Dr. {[id number]::Hlpoonli,Ynqqfjw,Floozw,Wjmzwimj,Tpoopjfd,Cpwrzwu}  Libby Blanch, DO PGY-2 Internal Medicine Resident  Pager: [phone]

## 2024-02-01 ENCOUNTER — Other Ambulatory Visit (HOSPITAL_COMMUNITY): Payer: Self-pay

## 2024-02-20 ENCOUNTER — Ambulatory Visit: Admitting: Student

## 2024-02-20 ENCOUNTER — Other Ambulatory Visit (HOSPITAL_COMMUNITY): Payer: Self-pay

## 2024-02-20 VITALS — BP 139/71 | HR 100 | Temp 98.5°F | Ht 60.45 in | Wt 149.8 lb

## 2024-02-20 DIAGNOSIS — M65331 Trigger finger, right middle finger: Secondary | ICD-10-CM

## 2024-02-20 DIAGNOSIS — M65341 Trigger finger, right ring finger: Secondary | ICD-10-CM

## 2024-02-20 DIAGNOSIS — Z7985 Long-term (current) use of injectable non-insulin antidiabetic drugs: Secondary | ICD-10-CM

## 2024-02-20 DIAGNOSIS — G4486 Cervicogenic headache: Secondary | ICD-10-CM | POA: Diagnosis not present

## 2024-02-20 DIAGNOSIS — Z7984 Long term (current) use of oral hypoglycemic drugs: Secondary | ICD-10-CM | POA: Diagnosis not present

## 2024-02-20 DIAGNOSIS — E119 Type 2 diabetes mellitus without complications: Secondary | ICD-10-CM | POA: Diagnosis not present

## 2024-02-20 LAB — POCT GLYCOSYLATED HEMOGLOBIN (HGB A1C): Hemoglobin A1C: 5.7 % — AB (ref 4.0–5.6)

## 2024-02-20 LAB — GLUCOSE, CAPILLARY: Glucose-Capillary: 83 mg/dL (ref 70–99)

## 2024-02-20 MED ORDER — CYCLOBENZAPRINE HCL 5 MG PO TABS
5.0000 mg | ORAL_TABLET | Freq: Every evening | ORAL | 0 refills | Status: DC | PRN
  Filled 2024-02-20: qty 5, 5d supply, fill #0

## 2024-02-20 NOTE — Patient Instructions (Signed)
 Thank you, [name] for allowing [place] to provide your care today. .    I have ordered the following labs for you:   Lab Orders         Glucose, capillary         POC Hbg A1C       Referrals ordered today:    Referral Orders         Ambulatory referral to Physical Therapy      I have ordered the following medication/changed the following medications:   Start the following medications: Meds ordered this encounter  Medications   cyclobenzaprine (FLEXERIL) 5 MG tablet    Sig: Take 1 tablet (5 mg total) by mouth at bedtime as needed for muscle spasms.    Dispense:  5 tablet    Refill:  0     Follow up: 3 months    Remember:   For your neck:  -Physical Therapy  -Flexeril (muscle relaxant) only when needed   -Rub on icy hot  -Tylenol - limit it only use it if in pain, do not schedule it.   For your diabetes:  -Lifestyle changes- continue eating healthy, continue with exercise  -Stop insulin! Michele Andrews!   Should you have any questions or concerns please call the internal medicine clinic at [phone].     Manuela Neptune, MD [facility]
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
## 2024-02-21 DIAGNOSIS — G4486 Cervicogenic headache: Secondary | ICD-10-CM | POA: Insufficient documentation

## 2024-02-21 DIAGNOSIS — M6530 Trigger finger, unspecified finger: Secondary | ICD-10-CM | POA: Insufficient documentation
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
## 2024-02-21 NOTE — Assessment & Plan Note (Signed)
 Pt has a hisotry of T2DM. Palpable nodule on her right 4th digit. Uses a brace that helps her. Counseled that this is related to enlargement of the flexor tendons on the hand.   -Referral to sports medicine for consideration of steroid injections.

## 2024-02-21 NOTE — Assessment & Plan Note (Signed)
 Lab Results  Component Value Date   HGBA1C 5.7 (A) 02/20/2024   HGBA1C 6.3 (A) 11/15/2023   HGBA1C 6.9 (A) 07/31/2023   Michele Andrews's A1c is currently at 5.7. Is currently on Trulicity 4.5mg  weekly, Metformin 1,000mg  BID , and Semglee 12 units daily. Has had about a 10 lbs weight loss that has been intentional since last year. Did not bring glucometer but reports glucose between 113-109 in the morning. The highest it was was 133. Reports no episodes of hypoglycemia. Is active when the weather is nice and would walk around the hospital, will start doing that again now that the weather is starting to improve.   -Stop insulin  -cw Metformin 1000mg  BID  -Cw Trulicity 4.5mg  weekly

## 2024-02-25 ENCOUNTER — Other Ambulatory Visit: Payer: Self-pay | Admitting: Internal Medicine

## 2024-02-25 DIAGNOSIS — Z1231 Encounter for screening mammogram for malignant neoplasm of breast: Secondary | ICD-10-CM

## 2024-03-03 ENCOUNTER — Ambulatory Visit
Admission: RE | Admit: 2024-03-03 | Discharge: 2024-03-03 | Disposition: A | Discharge status: Home / Self Care | Source: Ambulatory Visit | Attending: Internal Medicine

## 2024-03-03 DIAGNOSIS — Z1231 Encounter for screening mammogram for malignant neoplasm of breast: Secondary | ICD-10-CM

## 2024-03-04 ENCOUNTER — Ambulatory Visit (INDEPENDENT_AMBULATORY_CARE_PROVIDER_SITE_OTHER): Admitting: Family Medicine

## 2024-03-04 ENCOUNTER — Encounter: Payer: Self-pay | Admitting: Family Medicine

## 2024-03-04 VITALS — BP 129/78 | Ht 64.5 in | Wt 149.0 lb

## 2024-03-04 DIAGNOSIS — E119 Type 2 diabetes mellitus without complications: Secondary | ICD-10-CM

## 2024-03-04 DIAGNOSIS — Z794 Long term (current) use of insulin: Secondary | ICD-10-CM | POA: Diagnosis not present

## 2024-03-04 DIAGNOSIS — M65331 Trigger finger, right middle finger: Secondary | ICD-10-CM | POA: Diagnosis not present

## 2024-03-04 MED ORDER — METHYLPREDNISOLONE ACETATE 40 MG/ML IJ SUSP
20.0000 mg | Freq: Once | INTRAMUSCULAR | Status: AC
  Administered 2024-03-04: 20 mg via INTRA_ARTICULAR

## 2024-03-04 NOTE — Progress Notes (Signed)
 DATE OF VISIT: 03/04/2024        Michele Andrews DOB: [birth year] MRN: [id number]  CC:  Rt middle finger  History- Michele Andrews is a 73 y.o. 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 ago   Dry eye 11/09/2015   Family history of adverse reaction to anesthesia    20 YRS. AGO, HER 20 YR. OLD NEPHEW, ENDED UP ON VENTILATOR- PT. THINKS ITS BECAUSE HE ASPIRATED    Hypercalcemia    Hyperlipidemia    Hypertension    Osteoarthritis of left knee 02/23/2016   Osteopenia 09/20/1999   Rotator cuff syndrome of right shoulder    Vitamin D insufficiency 04/12/2015    Past Surgical History Past Surgical History:  Procedure Laterality Date   ABDOMINAL HYSTERECTOMY     BREAST CYST EXCISION Left    BREAST SURGERY Left 1963   cyst removed    PARATHYROIDECTOMY Right 11/08/2015   PARATHYROIDECTOMY N/A 11/08/2015   Procedure: RIGHT INFERIOR PARATHYROIDECTOMY;  Surgeon: Darnell Level, MD;  Location: [facility] OR;  Service: General;  Laterality: N/A;    Medications Current Outpatient Medications  Medication Sig Dispense Refill   acetaminophen (TYLENOL) 650 MG CR tablet Take 650 mg by mouth every 8 (eight) hours as needed for pain.     amLODipine (NORVASC) 10 MG tablet Take 1 tablet (10 mg total) by mouth daily. 30 tablet 11   atorvastatin (LIPITOR) 40 MG tablet Take 1 tablet (40 mg total) by mouth daily. 90 tablet 3   Blood Glucose Monitoring Suppl  (FREESTYLE FREEDOM LITE) w/Device KIT 1 each by Does not apply route 3 (three) times daily before meals. 1 kit 0   Blood Glucose Monitoring Suppl (TRUETRACK BLOOD GLUCOSE) W/DEVICE KIT Use to check blood sugar 2 times a day      Cholecalciferol (VITAMIN D3) 2000 units TABS Take 2,000 Units by mouth daily.     Continuous Glucose Sensor (FREESTYLE LIBRE 3 SENSOR) MISC Place 1 sensor on the skin every 14 days. Use to check glucose continuously 2 each 11   cyclobenzaprine (FLEXERIL) 5 MG tablet Take 1 tablet (5 mg total) by mouth at bedtime as needed for muscle spasms. 5 tablet 0   diclofenac Sodium (VOLTAREN) 1 % GEL Apply 2 grams topically 4 (four) times daily. 300 g 0   diclofenac Sodium (VOLTAREN) 1 % GEL Apply 2 g topically 4 (four) times daily. 300 g 0   glucose blood (FREESTYLE LITE) test strip Use to check blood sugar 2 times a day. 100 each 12   hydroxypropyl methylcellulose / hypromellose (ISOPTO TEARS / GONIOVISC) 2.5 % ophthalmic solution Place 1-2 drops into both eyes 3 (three) times daily as needed for dry eyes.     ibuprofen (ADVIL) 800 MG tablet Take 1 tablet (800 mg total) by mouth every 8 (eight) hours as needed for pain 30 tablet 1   Lancets (FREESTYLE) lancets Use 2 times  daily to check blood sugar 300 each 1   lisinopril (ZESTRIL) 20 MG tablet Take 1 tablet (20 mg total) by mouth daily. 90 tablet 3   metFORMIN (GLUCOPHAGE) 1000 MG tablet Take 1 tablet (1,000 mg total) by mouth 2 (two) times daily with a meal. 180 tablet 3   metoprolol succinate (TOPROL-XL) 25 MG 24 hr tablet Take 1 tablet (25 mg total) by mouth daily before breakfast. 90 tablet 3   No current facility-administered medications for this visit.    Allergies has no known allergies.  Family History - reviewed per EMR and intake form  Social History   reports no history of alcohol use.  reports that she has never smoked. She has never used smokeless tobacco.  reports no history of drug use. 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  She does have tenderness over the A1 pulley of the third finger.  Palpable triggering noted along the A1 pulley.  No tenderness along the rest of the hand.  No other triggering appreciated.  Normal grip strength.  NEURO: sensation intact to light touch upper extremity bilaterally VASC: no edema   Assessment & Plan Trigger finger, right middle finger Acute trigger finger of the right third finger, ongoing for the past 2 months  Plan: -Diagnosis and treatment discussed.  She is a candidate for a cortisone injection and would like to proceed with this today.  She is aware of the risk and benefits including elevation of her blood sugar since she is a diabetic.  She would like to proceed.  See procedure note below for further details -Was instructed on Band-Aid splint application that she can use at bedtime to prevent triggering -She can continue to use her glove while at work as well -He can consider repeat injection in 6 to 8 weeks if initial injection is not helpful. -She will follow-up with [place] in 6 to 8 weeks if no improvement or sooner.  PROCEDURE:  Risks & benefits of RT 3rd finger trigger finger injection reviewed.  Consent obtained.  Time-out completed.  Patient prepped and draped in the normal fashion.  Area cleansed with alcohol.  Ethyl chloride spray used to anesthetize the skin.  Solution of 0.5 mL 1% lidocaine with 0.5 mL methylprednisolone (Depo-Medrol) 40mg /ml injected into 3rd finger A1 pulley.  Patient tolerated procedure well without any complications.  Area covered with adhesive bandage.  Post-procedure care reviewed.  All questions answered.  Type 2 diabetes mellitus treated with insulin (HCC) Well-controlled, A1c  5.7 02/20/2024  Plan: -Right middle finger trigger finger injection completed today, did advise possible to note some increase in her blood sugar over the next few days.  She should reach out if she has significant elevations -Should continue diabetic regimen per PCP  Patient expressed understanding & agreement with above.  No diagnosis found.  No orders of the defined types were placed in this encounter.   No orders of the defined types were placed in this encounter.

## 2024-03-04 NOTE — Assessment & Plan Note (Signed)
 Well-controlled, A1c 5.7 02/20/2024  Plan: -Right middle finger trigger finger injection completed today, did advise possible to note some increase in her blood sugar over the next few days.  She should reach out if she has significant elevations -Should continue diabetic regimen per PCP

## 2024-03-04 NOTE — Patient Instructions (Signed)

## 2024-03-06 ENCOUNTER — Other Ambulatory Visit: Payer: Self-pay | Admitting: Internal Medicine

## 2024-03-06 DIAGNOSIS — R928 Other abnormal and inconclusive findings on diagnostic imaging of breast: Secondary | ICD-10-CM

## 2024-03-07 ENCOUNTER — Other Ambulatory Visit (HOSPITAL_COMMUNITY): Payer: Self-pay

## 2024-03-07 ENCOUNTER — Other Ambulatory Visit: Payer: Self-pay | Admitting: Internal Medicine

## 2024-03-07 DIAGNOSIS — E119 Type 2 diabetes mellitus without complications: Secondary | ICD-10-CM

## 2024-03-07 MED ORDER — TRULICITY 4.5 MG/0.5ML ~~LOC~~ SOAJ
4.5000 mg | SUBCUTANEOUS | 2 refills | Status: AC
Start: 2024-03-07 — End: 2024-09-17
  Filled 2024-03-07: qty 2, 28d supply, fill #0
  Filled 2024-04-11 – 2024-05-05 (×2): qty 2, 28d supply, fill #1
  Filled 2024-05-27 – 2024-08-19 (×2): qty 2, 28d supply, fill #2

## 2024-03-10 ENCOUNTER — Other Ambulatory Visit: Payer: Self-pay | Admitting: Internal Medicine

## 2024-03-10 ENCOUNTER — Other Ambulatory Visit (HOSPITAL_COMMUNITY): Payer: Self-pay

## 2024-03-10 DIAGNOSIS — M25512 Pain in left shoulder: Secondary | ICD-10-CM

## 2024-03-10 MED ORDER — IBUPROFEN 800 MG PO TABS
800.0000 mg | ORAL_TABLET | Freq: Three times a day (TID) | ORAL | 1 refills | Status: DC | PRN
  Filled 2024-03-10: qty 30, 10d supply, fill #0
  Filled 2024-04-10: qty 30, 10d supply, fill #1

## 2024-03-11 ENCOUNTER — Other Ambulatory Visit: Payer: Self-pay

## 2024-03-11 ENCOUNTER — Ambulatory Visit: Attending: Internal Medicine | Admitting: Physical Therapy

## 2024-03-11 ENCOUNTER — Encounter: Payer: Self-pay | Admitting: Physical Therapy

## 2024-03-11 VITALS — BP 128/77 | HR 82

## 2024-03-11 DIAGNOSIS — M542 Cervicalgia: Secondary | ICD-10-CM | POA: Diagnosis not present

## 2024-03-11 DIAGNOSIS — G4486 Cervicogenic headache: Secondary | ICD-10-CM | POA: Diagnosis not present
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
## 2024-03-12 ENCOUNTER — Encounter: Payer: Self-pay | Admitting: Physical Therapy

## 2024-03-19 ENCOUNTER — Ambulatory Visit: Admitting: Physical Therapy

## 2024-03-19 ENCOUNTER — Encounter: Payer: Self-pay | Admitting: Physical Therapy

## 2024-03-19 VITALS — BP 140/78 | HR 88

## 2024-03-19 DIAGNOSIS — M542 Cervicalgia: Secondary | ICD-10-CM

## 2024-03-19 DIAGNOSIS — G4486 Cervicogenic headache: Secondary | ICD-10-CM | POA: Diagnosis not present

## 2024-03-19 NOTE — Therapy (Signed)
 OUTPATIENT PHYSICAL THERAPY CERVICAL TREATMENT   Patient Name: Michele Andrews MRN: [id number] DOB:[birth year], 73 y.o., female Today's Date: 03/19/2024  END OF SESSION:  PT End of Session - 03/19/24 1452     Visit Number 2    Number of Visits 4    Date for PT Re-Evaluation 04/09/24    Authorization Type [employer] -  Employee    PT Start Time 1450    PT Stop Time 1528    PT Time Calculation (min) 38 min    Equipment Utilized During Treatment Gait belt    Activity Tolerance Patient tolerated treatment well    Behavior During Therapy [facility] for tasks assessed/performed             Past Medical History:  Diagnosis Date   Carpal tunnel syndrome 03/30/2010   Diabetes mellitus    Type II- diagnosed 30 yrs. ago   Dry eye 11/09/2015   Family history of adverse reaction to anesthesia    20 YRS. AGO, HER 20 YR. OLD NEPHEW, ENDED UP ON VENTILATOR- PT. THINKS ITS BECAUSE HE ASPIRATED    Hypercalcemia    Hyperlipidemia    Hypertension    Osteoarthritis of left knee 02/23/2016   Osteopenia 09/20/1999   Rotator cuff syndrome of right shoulder    Vitamin D insufficiency 04/12/2015   Past Surgical History:  Procedure Laterality Date   ABDOMINAL HYSTERECTOMY     BREAST CYST EXCISION Left    BREAST SURGERY Left 1963   cyst removed    PARATHYROIDECTOMY Right 11/08/2015   PARATHYROIDECTOMY N/A 11/08/2015   Procedure: RIGHT INFERIOR PARATHYROIDECTOMY;  Surgeon: Darnell Level, MD;  Location: [facility] OR;  Service: General;  Laterality: N/A;   Patient Active Problem List   Diagnosis Date Noted   Cervicogenic headache 02/21/2024   Trigger finger 02/21/2024   Nuclear sclerotic cataract of both eyes 06/15/2020   Moderate nonproliferative diabetic retinopathy of both eyes without macular edema associated with type 2 diabetes mellitus (HCC) 06/15/2020   Alopecia 10/16/2019   Nasal polyp 10/02/2018   Neck pain, acute 09/02/2018   Acromioclavicular joint arthritis 10/10/2017   Osteoarthritis  of left knee 02/23/2016   Vitamin D insufficiency 03/10/2015   Osteopenia 08/03/2014   GERD (gastroesophageal reflux disease) 03/04/2014   Preventative health care 01/06/2012   Primary hyperparathyroidism (HCC) 11/02/2008   Hyperlipemia 11/22/2006   Essential hypertension 11/22/2006   Type 2 diabetes mellitus treated with insulin (HCC) 11/22/1994    PCP: Reymundo Poll. MD REFERRING PROVIDER: Miguel Aschoff, MD  REFERRING DIAG: [phone] (ICD-10-CM) - Cervicogenic headache  THERAPY DIAG:  Cervicalgia  Rationale for Evaluation and Treatment: Rehabilitation  ONSET DATE: 02/20/2024 (referral date)   SUBJECTIVE:  SUBJECTIVE STATEMENT: Patient reports that she has been working on her exercises and they have been helping her pain. She feels like she is doing much better overall despite high reports of pain. Denies falls and near falls.   Attends session alone with spouse Marilu Favre in lobby  Hand dominance: Right  PERTINENT HISTORY:  Cervicogenic headaches, wears glove for carpal tunnel   PAIN:  Are you having pain? Yes: NPRS scale: 8/10 Pain location: R side of neck Pain description: feels like pressure  Aggravating factors: nothing seems to make it feel worse Relieving factors: massaging makes it feel worse, having ice in that area   PRECAUTIONS: None  RED FLAGS: None     WEIGHT BEARING RESTRICTIONS: No  FALLS:  Has patient fallen in last 6 months? 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  DIAGNOSTIC FINDINGS:  No relevant recent imaging   TREATMENT DATE:                                                                                                                               Vitals:   03/19/24 1455  BP: (!) 140/78  Pulse: 88  Seated on RUE at rest, elevated but [facility] for therapy  TherEx: - Seated Cervical Sidebending Stretch  - 2 sets - 30 seconds hold - Seated Levator Scapulae Stretch  - 2 sets - 45 seconds  hold   (Reports increased tightness on R side) - Cervical retractions into mat 2 x 10 reps x 3 seconds hold  - Supine cervical rotation through tolerated ROM 2 x 5 reps   TherAct:  - Wall angel with low T for postural retraining in standing 2 x 12  - Briefly reviewed postural retraining and benefits for pain management, explained not realistic to maintain all the day but can be helpful for management of symptoms  PATIENT EDUCATION:  Education details: POC, goal collaboration, examination findings  Person educated: Patient Education method: Explanation and Handouts Education comprehension: verbalized understanding and needs further education  HOME EXERCISE PROGRAM: Access Code: [email or web address] URL: [email or web address] Date: 03/19/2024 Prepared by: Maryruth Eve  Exercises - Seated Cervical Sidebending Stretch  - 1 x daily - 7 x weekly - 3 sets - 30 seconds hold - Seated Levator Scapulae Stretch  - 1 x daily - 7 x weekly - 3 sets - 30 seconds  hold - Supine Chin Tuck  - 1 x daily - 7 x weekly - 2 sets - 10 reps - 2-3 seconds  hold - Supine Cervical Rotation AROM on Pillow  - 1 x daily - 7  x weekly - 3 sets - 5 reps  ASSESSMENT:  CLINICAL IMPRESSION: Emphasis on skilled physical therapy program on progressing patient's HEP for home. Despite patient's severe reports of pain as 8/10 at start of session, tolerated session excellently, reporting  feeling so relaxed that "I could fall asleep." Reported mild discomfort in R side of neck. Reports reduction in pain from 8/10 to 3/10 by end of session. Continue POC.  OBJECTIVE IMPAIRMENTS: decreased ROM, postural dysfunction, and pain.   ACTIVITY LIMITATIONS: lifting and sleeping  PARTICIPATION LIMITATIONS: community activity and occupation  PERSONAL FACTORS: Time since onset of injury/illness/exacerbation are also affecting patient's functional outcome.   REHAB POTENTIAL: Good  CLINICAL DECISION MAKING: Stable/uncomplicated  EVALUATION COMPLEXITY: Low   GOALS: Goals reviewed with patient? Yes  LONG TERM GOALS: Target date: 04/09/2024 (STG = LTG due to POC length)   Patient will report demonstrate independence with final HEP in order to maintain current gains and continue to progress after physical therapy discharge.   Baseline: provided initial HEP on 4/2 Goal status: INITIAL  2.  Patient will improve NDI score to 10% impairment or less indicate a clinically important improvement in neck pain.   Baseline: 22% impairment  Goal status: INITIAL  PLAN:  PT FREQUENCY: 1x/week  PT DURATION: 3 weeks  PLANNED INTERVENTIONS: 97164- PT Re-evaluation, 97110-Therapeutic exercises, 97530- Therapeutic activity, 97112- Neuromuscular re-education, 97535- Self Care, 56213- Manual therapy, and Dry Needling  PLAN FOR NEXT SESSION: update cervical HEP -chin tucks with resistance, postural retraining like scap squeezes and wall angels and supine over half foam with chin tuck and overhead reach trial   Carmelia Bake, PT, DPT 03/19/2024, 3:34 PM

## 2024-03-22 NOTE — Progress Notes (Signed)
 [facility] Attending  Case discussed with the resident at the time of the visit.  We reviewed the resident's history and exam and pertinent patient test results.  I agree with the assessment, diagnosis, and plan of care documented in the resident's note.

## 2024-04-03 ENCOUNTER — Other Ambulatory Visit (HOSPITAL_COMMUNITY): Payer: Self-pay

## 2024-04-03 ENCOUNTER — Ambulatory Visit: Admitting: Physical Therapy

## 2024-04-04 ENCOUNTER — Other Ambulatory Visit: Payer: Self-pay | Admitting: Internal Medicine

## 2024-04-04 ENCOUNTER — Ambulatory Visit
Admission: RE | Admit: 2024-04-04 | Discharge: 2024-04-04 | Disposition: A | Discharge status: Home / Self Care | Source: Ambulatory Visit | Attending: Internal Medicine | Admitting: Internal Medicine

## 2024-04-04 DIAGNOSIS — R921 Mammographic calcification found on diagnostic imaging of breast: Secondary | ICD-10-CM

## 2024-04-04 DIAGNOSIS — R928 Other abnormal and inconclusive findings on diagnostic imaging of breast: Secondary | ICD-10-CM

## 2024-04-07 NOTE — Telephone Encounter (Signed)
 Patient has been sch fro her Mammo/Bio/   Name: Michele Andrews, Michele Andrews MRN: [id number]  Date: 04/09/2024 Status: Sch  Time: 7:30 AM Length: 60  Visit Type: MM BREAST STEREO WIRE [[id number]] Copay: $0.00  Provider: [facility]      Copied from CRM #[id number]. Topic: General - Other >> Apr 04, 2024  4:20 PM Corin V wrote: Reason for CRM: [facility] was calling to advise that patient was seen there today and she will need a follow up appointment with PCP for a biopsy. She is sending over the report.

## 2024-04-09 ENCOUNTER — Ambulatory Visit
Admission: RE | Admit: 2024-04-09 | Discharge: 2024-04-09 | Disposition: A | Discharge status: Home / Self Care | Source: Ambulatory Visit | Attending: Internal Medicine | Admitting: Internal Medicine

## 2024-04-09 ENCOUNTER — Ambulatory Visit: Admitting: Physical Therapy

## 2024-04-09 ENCOUNTER — Other Ambulatory Visit: Payer: Self-pay | Admitting: Internal Medicine

## 2024-04-09 DIAGNOSIS — R921 Mammographic calcification found on diagnostic imaging of breast: Secondary | ICD-10-CM

## 2024-04-09 DIAGNOSIS — D0511 Intraductal carcinoma in situ of right breast: Secondary | ICD-10-CM | POA: Diagnosis not present

## 2024-04-09 DIAGNOSIS — N6313 Unspecified lump in the right breast, lower outer quadrant: Secondary | ICD-10-CM | POA: Diagnosis not present

## 2024-04-09 DIAGNOSIS — C50919 Malignant neoplasm of unspecified site of unspecified female breast: Secondary | ICD-10-CM

## 2024-04-09 HISTORY — DX: Malignant neoplasm of unspecified site of unspecified female breast: C50.919

## 2024-04-09 HISTORY — PX: BREAST BIOPSY: SHX20

## 2024-04-10 ENCOUNTER — Other Ambulatory Visit: Payer: Self-pay | Admitting: Student

## 2024-04-10 ENCOUNTER — Other Ambulatory Visit (HOSPITAL_COMMUNITY): Payer: Self-pay

## 2024-04-10 DIAGNOSIS — G4486 Cervicogenic headache: Secondary | ICD-10-CM

## 2024-04-10 LAB — SURGICAL PATHOLOGY

## 2024-04-10 MED ORDER — CYCLOBENZAPRINE HCL 5 MG PO TABS
5.0000 mg | ORAL_TABLET | Freq: Every evening | ORAL | 0 refills | Status: DC | PRN
  Filled 2024-04-10: qty 5, 5d supply, fill #0

## 2024-04-11 ENCOUNTER — Other Ambulatory Visit (HOSPITAL_COMMUNITY): Payer: Self-pay

## 2024-04-14 ENCOUNTER — Other Ambulatory Visit (HOSPITAL_COMMUNITY): Payer: Self-pay

## 2024-04-16 ENCOUNTER — Ambulatory Visit: Payer: Self-pay | Admitting: General Surgery

## 2024-04-16 DIAGNOSIS — D0511 Intraductal carcinoma in situ of right breast: Secondary | ICD-10-CM | POA: Diagnosis not present

## 2024-04-16 MED ORDER — KETOROLAC TROMETHAMINE 15 MG/ML IJ SOLN
15.0000 mg | INTRAMUSCULAR | Status: AC
Start: 2024-04-17 — End: 2024-04-18

## 2024-04-17 ENCOUNTER — Encounter: Payer: Self-pay | Admitting: *Deleted

## 2024-04-17 ENCOUNTER — Telehealth: Payer: Self-pay | Admitting: Radiation Oncology

## 2024-04-17 DIAGNOSIS — D0511 Intraductal carcinoma in situ of right breast: Secondary | ICD-10-CM | POA: Insufficient documentation

## 2024-04-17 NOTE — Telephone Encounter (Signed)
LVM to schedule CON with Dr. Lisbeth Renshaw

## 2024-04-21 NOTE — Progress Notes (Incomplete)
 Radiation Oncology         [phone]) [phone] ________________________________  Name: Michele Andrews        MRN: [id number]  Date of Service: 04/22/2024 DOB: 04/19/1951  WU:[id number], [name], MD  Caralyn Chandler, MD     REFERRING PHYSICIAN: Caralyn Chandler, MD   DIAGNOSIS: The encounter diagnosis was Ductal carcinoma in situ (DCIS) of right breast.   HISTORY OF PRESENT ILLNESS: Michele Andrews is a 73 y.o. female seen at the request of Dr. Alethea Andes for a new diagnosis of right breast cancer.  The patient had a screening detected mammogram on 03/03/2024 which showed calcifications in the right breast and no abnormalities in the left.  She underwent further diagnostic mammogram on 04/04/2024 which showed 2 groups of indeterminate calcifications in the lower outer quadrant measuring 5 mm and 6 mm, she underwent stereotactic biopsy on 04/09/2024 which showed intermediate grade DCIS of both specimens, her cancer was ER positive strong, and PR 1% positive moderate staining.  She met with Dr. Alethea Andes and is getting scheduled for lumpectomy.  She is seen today to discuss treatment recommendations.    PREVIOUS RADIATION THERAPY: {EXAM; YES/NO:19492::"No"}   PAST MEDICAL HISTORY:  Past Medical History:  Diagnosis Date   Carpal tunnel syndrome 03/30/2010   Diabetes mellitus    Type II- diagnosed 30 yrs. ago   Dry eye 11/09/2015   Family history of adverse reaction to anesthesia    20 YRS. AGO, HER 20 YR. OLD NEPHEW, ENDED UP ON VENTILATOR- PT. THINKS ITS BECAUSE HE ASPIRATED    Hypercalcemia    Hyperlipidemia    Hypertension    Osteoarthritis of left knee 02/23/2016   Osteopenia 09/20/1999   Rotator cuff syndrome of right shoulder    Vitamin D  insufficiency 04/12/2015       PAST SURGICAL HISTORY: Past Surgical History:  Procedure Laterality Date   ABDOMINAL HYSTERECTOMY     BREAST BIOPSY Right 04/09/2024   MM RT BREAST BX W LOC DEV 1ST LESION IMAGE BX SPEC STEREO GUIDE 04/09/2024 [facility]    BREAST BIOPSY Right 04/09/2024   MM RT BREAST BX W LOC DEV EA AD LESION IMG BX SPEC STEREO GUIDE 04/09/2024 [facility]   BREAST CYST EXCISION Left    BREAST SURGERY Left 1963   cyst removed    PARATHYROIDECTOMY Right 11/08/2015   PARATHYROIDECTOMY N/A 11/08/2015   Procedure: RIGHT INFERIOR PARATHYROIDECTOMY;  Surgeon: Oralee Billow, MD;  Location: [facility] OR;  Service: General;  Laterality: N/A;     FAMILY HISTORY:  Family History  Problem Relation Age of Onset   Hypertension Mother    Cancer Mother    Breast cancer Mother 20   Breast cancer Sister 41   Breast cancer Maternal Aunt 64   Breast cancer Cousin 31     SOCIAL HISTORY:  reports that she has never smoked. She has never used smokeless tobacco. She reports that she does not drink alcohol and does not use drugs.  The patient is married.  She works for [employer] in [employer].***   ALLERGIES: Patient has no known allergies.   MEDICATIONS:  Current Outpatient Medications  Medication Sig Dispense Refill   acetaminophen  (TYLENOL ) 650 MG CR tablet Take 650 mg by mouth every 8 (eight) hours as needed for pain.     amLODipine  (NORVASC ) 10 MG tablet Take 1 tablet (10 mg total) by mouth daily. 30 tablet 11   atorvastatin  (LIPITOR) 40 MG tablet Take 1 tablet (40 mg total)  by mouth daily. 90 tablet 3   Blood Glucose Monitoring Suppl (FREESTYLE FREEDOM LITE) w/Device KIT 1 each by Does not apply route 3 (three) times daily before meals. 1 kit 0   Blood Glucose Monitoring Suppl (TRUETRACK BLOOD GLUCOSE) W/DEVICE KIT Use to check blood sugar 2 times a day      Cholecalciferol (VITAMIN D3) 2000 units TABS Take 2,000 Units by mouth daily.     Continuous Glucose Sensor (FREESTYLE LIBRE 3 SENSOR) MISC Place 1 sensor on the skin every 14 days. Use to check glucose continuously 2 each 11   cyclobenzaprine  (FLEXERIL ) 5 MG tablet Take 1 tablet (5 mg total) by mouth at bedtime as needed for muscle spasms. 5 tablet 0   diclofenac  Sodium  (VOLTAREN ) 1 % GEL Apply 2 grams topically 4 (four) times daily. 300 g 0   diclofenac  Sodium (VOLTAREN ) 1 % GEL Apply 2 g topically 4 (four) times daily. 300 g 0   Dulaglutide  (TRULICITY ) 4.5 MG/0.5ML SOAJ Inject 4.5 mg as directed once a week. 2 mL 2   glucose blood (FREESTYLE LITE) test strip Use to check blood sugar 2 times a day. 100 each 12   hydroxypropyl methylcellulose / hypromellose (ISOPTO TEARS / GONIOVISC) 2.5 % ophthalmic solution Place 1-2 drops into both eyes 3 (three) times daily as needed for dry eyes.     ibuprofen  (ADVIL ) 800 MG tablet Take 1 tablet (800 mg total) by mouth every 8 (eight) hours as needed for pain 30 tablet 1   Lancets (FREESTYLE) lancets Use 2 times daily to check blood sugar 300 each 1   lisinopril  (ZESTRIL ) 20 MG tablet Take 1 tablet (20 mg total) by mouth daily. 90 tablet 3   metFORMIN  (GLUCOPHAGE ) 1000 MG tablet Take 1 tablet (1,000 mg total) by mouth 2 (two) times daily with a meal. 180 tablet 3   metoprolol  succinate (TOPROL -XL) 25 MG 24 hr tablet Take 1 tablet (25 mg total) by mouth daily before breakfast. 90 tablet 3   No current facility-administered medications for this visit.     REVIEW OF SYSTEMS: On review of systems, the patient reports that she is doing ***     PHYSICAL EXAM:  Wt Readings from Last 3 Encounters:  03/04/24 149 lb (67.6 kg)  02/20/24 149 lb 12.8 oz (67.9 kg)  11/15/23 149 lb 12.8 oz (67.9 kg)   Temp Readings from Last 3 Encounters:  02/20/24 98.5 F (36.9 C) (Oral)  11/15/23 98.4 F (36.9 C) (Oral)  07/31/23 98.5 F (36.9 C) (Oral)   BP Readings from Last 3 Encounters:  03/19/24 (!) 140/78  03/11/24 128/77  03/04/24 129/78   Pulse Readings from Last 3 Encounters:  03/19/24 88  03/11/24 82  02/20/24 100    In general this is a well appearing African American female in no acute distress. She's alert and oriented x4 and appropriate throughout the examination. Cardiopulmonary assessment is negative for acute  distress and she exhibits normal effort. Bilateral breast exam is deferred.    ECOG = ***  0 - Asymptomatic (Fully active, able to carry on all predisease activities without restriction)  1 - Symptomatic but completely ambulatory (Restricted in physically strenuous activity but ambulatory and able to carry out work of a light or sedentary nature. For example, light housework, office work)  2 - Symptomatic, <50% in bed during the day (Ambulatory and capable of all self care but unable to carry out any work activities. Up and about more than 50% of waking  hours)  3 - Symptomatic, >50% in bed, but not bedbound (Capable of only limited self-care, confined to bed or chair 50% or more of waking hours)  4 - Bedbound (Completely disabled. Cannot carry on any self-care. Totally confined to bed or chair)  5 - Death   Aurea Blossom MM, Creech RH, Tormey DC, et al. [phone]). "Toxicity and response criteria of the [facility]". Am. Hillard Lowes. Oncol. 5 (6): 649-55    LABORATORY DATA:  Lab Results  Component Value Date   WBC 8.4 10/15/2019   HGB 12.4 10/15/2019   HCT 37.7 10/15/2019   MCV 92 10/15/2019   PLT 224 10/15/2019   Lab Results  Component Value Date   NA 141 07/31/2023   K 3.8 07/31/2023   CL 100 07/31/2023   CO2 23 07/31/2023   Lab Results  Component Value Date   ALT 20 10/15/2019   AST 22 10/15/2019   ALKPHOS 60 10/15/2019   BILITOT 0.4 10/15/2019      RADIOGRAPHY: MM RT BREAST BX W LOC DEV 1ST LESION IMAGE BX SPEC STEREO GUIDE Addendum Date: 04/12/2024 ADDENDUM REPORT: 04/12/2024 07:09 ADDENDUM: Pathology revealed DUCTAL CARCINOMA IN SITU, SOLID, INTERMEDIATE NUCLEAR GRADE, NECROSIS: PRESENT, FOCAL CALCIFICATIONS: PRESENT of the RIGHT breast, 0.5 cm group, lower outer more posterior calcifications (NO CLIP PLACED). This was found to be concordant by Dr. Luann Rundle Mir. Pathology revealed DUCTAL CARCINOMA IN SITU, SOLID, INTERMEDIATE NUCLEAR GRADE, NECROSIS: PRESENT,  FOCAL, CALCIFICATIONS: PRESENT of the RIGHT breast, 0.6 cm group, lower outer more anterior calcifications, (X CLIP).  This was found to be concordant by Dr. Luann Rundle Mir. Unable to place a clip marker at the more posterior biopsy site (site 1) due to sheath dislodgement. Site 1 is located approximately 1.2 cm posterior and 0.7 cm superior to the X marker clip, which indicates the more anterior biopsy site (Site 2). Pathology results were discussed with the patient by telephone. The patient reported doing well after the biopsies with tenderness at the sites. Post biopsy instructions and care were reviewed and questions were answered. The patient was encouraged to call [facility] for any additional concerns. My direct phone number was provided. Surgical consultation has been arranged with Dr. Lillette Reid, per patient request, at [facility] on Apr 16, 2024. Pathology results reported by Kraig Peru, RN on 04/11/2024. Electronically Signed   By: Elester Grim M.D.   On: 04/12/2024 07:09   Result Date: 04/12/2024 CLINICAL DATA:  73 year old woman with 2 groups of indeterminate calcifications in the lower outer right breast measuring 0.5 and 0.6 cm presents for stereotactic guided biopsy. EXAM: RIGHT BREAST STEREOTACTIC CORE NEEDLE BIOPSY (x2) COMPARISON:  Previous exam(s). FINDINGS: The patient and I discussed the procedure of stereotactic-guided biopsy including benefits and alternatives. We discussed the high likelihood of a successful procedure. We discussed the risks of the procedure including infection, bleeding, tissue injury, clip migration, and inadequate sampling. Informed written consent was given. The usual time out protocol was performed immediately prior to the procedure. Site 1: Using sterile technique and 1% Lidocaine  as local anesthetic, under stereotactic guidance, a 9 gauge vacuum assisted device was used to perform core needle biopsy of the more posterior group of  calcifications (0.6 cm) in the lower outer quadrant of the right breast using a lateral approach. Specimen radiograph was performed showing calcifications. Lesion quadrant: Lower outer quadrant At the conclusion of the procedure, attempts to place a clip was unsuccessful. The sheath dislodged and  was external to the patient and no clip could be inserted. Site 2: Using sterile technique and 1% Lidocaine  as local anesthetic, under stereotactic guidance, a 9 gauge vacuum assisted device was used to perform core needle biopsy of the more anterior group of calcifications (0.5 cm) in the lower outer quadrant of the right breast using a lateral approach. Specimen radiograph was performed showing calcifications. Lesion quadrant: Lower outer quadrant At the conclusion of the procedure, X shaped tissue marker clip was deployed into the biopsy cavity. Follow-up 2-view mammogram was performed and dictated separately. IMPRESSION: 1. Stereotactic-guided biopsy of 2 groups of right breast calcifications. No apparent complications. 2. Unable to place a clip marker at the more posterior biopsy site (Site 1) due to sheath dislodgement. Site 1 is located approximately 1.2 cm posterior and 0.7 cm superior to the X marker clip, which indicates the more anterior biopsy site (Site 2). Electronically Signed: By: Elester Grim M.D. On: 04/09/2024 13:38   MM RT BREAST BX W LOC DEV EA AD LESION IMG BX SPEC STEREO GUIDE Addendum Date: 04/12/2024 ADDENDUM REPORT: 04/12/2024 07:09 ADDENDUM: Pathology revealed DUCTAL CARCINOMA IN SITU, SOLID, INTERMEDIATE NUCLEAR GRADE, NECROSIS: PRESENT, FOCAL CALCIFICATIONS: PRESENT of the RIGHT breast, 0.5 cm group, lower outer more posterior calcifications (NO CLIP PLACED). This was found to be concordant by Dr. Luann Rundle Mir. Pathology revealed DUCTAL CARCINOMA IN SITU, SOLID, INTERMEDIATE NUCLEAR GRADE, NECROSIS: PRESENT, FOCAL, CALCIFICATIONS: PRESENT of the RIGHT breast, 0.6 cm group, lower outer more  anterior calcifications, (X CLIP).  This was found to be concordant by Dr. Luann Rundle Mir. Unable to place a clip marker at the more posterior biopsy site (site 1) due to sheath dislodgement. Site 1 is located approximately 1.2 cm posterior and 0.7 cm superior to the X marker clip, which indicates the more anterior biopsy site (Site 2). Pathology results were discussed with the patient by telephone. The patient reported doing well after the biopsies with tenderness at the sites. Post biopsy instructions and care were reviewed and questions were answered. The patient was encouraged to call [facility] for any additional concerns. My direct phone number was provided. Surgical consultation has been arranged with Dr. Lillette Reid, per patient request, at [facility] on Apr 16, 2024. Pathology results reported by Kraig Peru, RN on 04/11/2024. Electronically Signed   By: Elester Grim M.D.   On: 04/12/2024 07:09   Result Date: 04/12/2024 CLINICAL DATA:  73 year old woman with 2 groups of indeterminate calcifications in the lower outer right breast measuring 0.5 and 0.6 cm presents for stereotactic guided biopsy. EXAM: RIGHT BREAST STEREOTACTIC CORE NEEDLE BIOPSY (x2) COMPARISON:  Previous exam(s). FINDINGS: The patient and I discussed the procedure of stereotactic-guided biopsy including benefits and alternatives. We discussed the high likelihood of a successful procedure. We discussed the risks of the procedure including infection, bleeding, tissue injury, clip migration, and inadequate sampling. Informed written consent was given. The usual time out protocol was performed immediately prior to the procedure. Site 1: Using sterile technique and 1% Lidocaine  as local anesthetic, under stereotactic guidance, a 9 gauge vacuum assisted device was used to perform core needle biopsy of the more posterior group of calcifications (0.6 cm) in the lower outer quadrant of the right breast using a  lateral approach. Specimen radiograph was performed showing calcifications. Lesion quadrant: Lower outer quadrant At the conclusion of the procedure, attempts to place a clip was unsuccessful. The sheath dislodged and was external to the patient and no  clip could be inserted. Site 2: Using sterile technique and 1% Lidocaine  as local anesthetic, under stereotactic guidance, a 9 gauge vacuum assisted device was used to perform core needle biopsy of the more anterior group of calcifications (0.5 cm) in the lower outer quadrant of the right breast using a lateral approach. Specimen radiograph was performed showing calcifications. Lesion quadrant: Lower outer quadrant At the conclusion of the procedure, X shaped tissue marker clip was deployed into the biopsy cavity. Follow-up 2-view mammogram was performed and dictated separately. IMPRESSION: 1. Stereotactic-guided biopsy of 2 groups of right breast calcifications. No apparent complications. 2. Unable to place a clip marker at the more posterior biopsy site (Site 1) due to sheath dislodgement. Site 1 is located approximately 1.2 cm posterior and 0.7 cm superior to the X marker clip, which indicates the more anterior biopsy site (Site 2). Electronically Signed: By: Elester Grim M.D. On: 04/09/2024 13:38   MM CLIP PLACEMENT RIGHT Result Date: 04/09/2024 CLINICAL DATA:  Status post stereotactic guided biopsy of 2 groups of right lower outer breast calcifications. EXAM: 3D DIAGNOSTIC RIGHT MAMMOGRAM POST STEREOTACTIC BIOPSY COMPARISON:  Previous exam(s). ACR Breast Density Category b: There are scattered areas of fibroglandular density. FINDINGS: 3D Mammographic images were obtained following stereotactic guided biopsy of 2 groups of right breast calcifications. X marker clip is present at the site of biopsy of the more anterior group, designated as Site 2. Unsuccessful deployment of marker clip at the biopsy of the more posterior group, designated as Site 1. IMPRESSION:  Appropriate positioning of the X shaped biopsy marking clip at the site of biopsy in the lower outer quadrant of the right breast (Site 2). Unable to place a clip marker at the more posterior biopsy site (Site 1) due to sheath dislodgement. Site 1 is located approximately 1.2 cm posterior and 0.7 cm superior to the X marker clip, which indicates the more anterior biopsy site (Site 2). Final Assessment: Post Procedure Mammograms for Marker Placement Electronically Signed   By: Elester Grim M.D.   On: 04/09/2024 13:42   MM Digital Diagnostic Unilat R Result Date: 04/04/2024 CLINICAL DATA:  73 year old female presents for further evaluation of RIGHT breast calcifications identified on screening mammogram. EXAM: DIGITAL DIAGNOSTIC UNILATERAL RIGHT MAMMOGRAM TECHNIQUE: Right digital diagnostic mammography was performed. COMPARISON:  Previous exam(s). ACR Breast Density Category b: There are scattered areas of fibroglandular density. FINDINGS: Full field and magnification views of the RIGHT breast demonstrate 2 groups of amorphous calcifications within the posterior LOWER OUTER RIGHT breast, 1 group measuring 0.5 cm and 1 group measuring 0.6 cm. The 2 groups are separated by a distance of 1 cm. IMPRESSION: Two groups of indeterminate LOWER OUTER RIGHT breast calcifications, measuring 0.5 cm and 0.6 cm. Tissue sampling of both of these groups recommended. RECOMMENDATION: Stereotactic guided biopsies of both groups of LOWER OUTER RIGHT breast calcifications. I have discussed the findings and recommendations with the patient. If applicable, a reminder letter will be sent to the patient regarding the next appointment. BI-RADS CATEGORY  4: Suspicious. Electronically Signed   By: Sundra Engel M.D.   On: 04/04/2024 15:05       IMPRESSION/PLAN: 1. Intermediate Grade, ER/PR positive DCIS of the right breast. Dr. Jeryl Moris discusses the pathology findings and reviews the nature of noninvasive breast disease. She is in the  process of scheduling breast conservation with lumpectomy. Dr. Jeryl Moris discusses the rationale for external radiotherapy to the breast  to reduce risks of local recurrence. She will also  meet with medical oncology to discuss adjuvant antiestrogen therapy to follow. We discussed the risks, benefits, short, and long term effects of radiotherapy, as well as the curative intent, and the patient is interested in proceeding. Dr. Jeryl Moris discusses the delivery and logistics of radiotherapy and anticipates a course of 4 weeks of radiotherapy. We will see her back a few weeks after surgery to discuss the simulation process and anticipate we starting radiotherapy about 4-6 weeks after surgery.      In a visit lasting *** minutes, greater than 50% of the time was spent face to face reviewing her case, as well as in preparation of, discussing, and coordinating the patient's care.  The above documentation reflects my direct findings during this shared patient visit. Please see the separate note by Dr. Jeryl Moris on this date for the remainder of the patient's plan of care.    Shelvia Dick, [facility]    **Disclaimer: This note was dictated with voice recognition software. Similar sounding words can inadvertently be transcribed and this note may contain transcription errors which may not have been corrected upon publication of note.**

## 2024-04-22 ENCOUNTER — Ambulatory Visit
Admission: RE | Admit: 2024-04-22 | Discharge: 2024-04-22 | Disposition: A | Discharge status: Home / Self Care | Source: Ambulatory Visit | Attending: Radiation Oncology | Admitting: Radiation Oncology

## 2024-04-22 ENCOUNTER — Encounter: Payer: Self-pay | Admitting: Radiation Oncology

## 2024-04-22 VITALS — BP 129/73 | HR 91 | Temp 97.1°F | Resp 18 | Ht 64.5 in | Wt 147.4 lb

## 2024-04-22 DIAGNOSIS — Z170 Estrogen receptor positive status [ER+]: Secondary | ICD-10-CM | POA: Diagnosis not present

## 2024-04-22 DIAGNOSIS — M8580 Other specified disorders of bone density and structure, unspecified site: Secondary | ICD-10-CM | POA: Diagnosis not present

## 2024-04-22 DIAGNOSIS — E559 Vitamin D deficiency, unspecified: Secondary | ICD-10-CM | POA: Insufficient documentation

## 2024-04-22 DIAGNOSIS — D0511 Intraductal carcinoma in situ of right breast: Secondary | ICD-10-CM

## 2024-04-22 DIAGNOSIS — Z79899 Other long term (current) drug therapy: Secondary | ICD-10-CM | POA: Insufficient documentation

## 2024-04-22 DIAGNOSIS — Z7985 Long-term (current) use of injectable non-insulin antidiabetic drugs: Secondary | ICD-10-CM | POA: Diagnosis not present

## 2024-04-22 DIAGNOSIS — Z791 Long term (current) use of non-steroidal anti-inflammatories (NSAID): Secondary | ICD-10-CM | POA: Insufficient documentation

## 2024-04-22 DIAGNOSIS — Z803 Family history of malignant neoplasm of breast: Secondary | ICD-10-CM | POA: Insufficient documentation

## 2024-04-22 DIAGNOSIS — I10 Essential (primary) hypertension: Secondary | ICD-10-CM | POA: Insufficient documentation

## 2024-04-22 DIAGNOSIS — Z7984 Long term (current) use of oral hypoglycemic drugs: Secondary | ICD-10-CM | POA: Diagnosis not present

## 2024-04-22 DIAGNOSIS — E119 Type 2 diabetes mellitus without complications: Secondary | ICD-10-CM | POA: Diagnosis not present

## 2024-04-22 DIAGNOSIS — E785 Hyperlipidemia, unspecified: Secondary | ICD-10-CM | POA: Diagnosis not present

## 2024-04-22 NOTE — Addendum Note (Signed)
 Encounter addended by: Bettejane Brownie, PA-C on: 04/22/2024 4:05 PM  Actions taken: Clinical Note Signed

## 2024-04-22 NOTE — Progress Notes (Signed)
 New Breast Cancer Diagnosis: Right Breast   Patient presented for screening mammogram in March 2025 which showed calcifications in the right breast.  Diagnostic workup revealed 2 groups of indeterminate calcifications in the lower outer quadrant of the right breast.  Biopsy showed intermediate grade DCIS    Histology per Pathology Report: grade 2, DCIS 04/09/2024  Receptor Status: ER(positive), PR (positive), Her2-neu (), Ki-(%)    Surgeon and surgical plan, if any:  Dr. Alethea Andes -Lumpectomy- Unscheduled at this time   Medical oncologist, treatment if any:     Family History of Breast/Ovarian/Prostate Cancer: Mom, sister, maternal and paternal cousin, and maternal aunt had breast cancer.  Lymphedema issues, if any: none      Pain issues, if any:She reports continued soreness in her breast since her biopsy.     SAFETY ISSUES: Prior radiation? No Pacemaker/ICD? No Possible current pregnancy? Hysterectomy Is the patient on methotrexate? No  Current Complaints / other details:

## 2024-04-23 ENCOUNTER — Other Ambulatory Visit: Payer: Self-pay | Admitting: General Surgery

## 2024-04-23 DIAGNOSIS — D0511 Intraductal carcinoma in situ of right breast: Secondary | ICD-10-CM

## 2024-04-28 ENCOUNTER — Telehealth: Payer: Self-pay | Admitting: Hematology and Oncology

## 2024-04-28 ENCOUNTER — Encounter: Payer: Self-pay | Admitting: *Deleted

## 2024-04-28 NOTE — Telephone Encounter (Signed)
 I received a message to schedule Michele Andrews on 6/9 at 10, or 10:45. Michele Andrews was unavailable, I informed her to return my call at her earliest convenience.

## 2024-05-01 ENCOUNTER — Other Ambulatory Visit (HOSPITAL_COMMUNITY): Payer: Self-pay

## 2024-05-01 ENCOUNTER — Encounter (HOSPITAL_BASED_OUTPATIENT_CLINIC_OR_DEPARTMENT_OTHER): Payer: Self-pay | Admitting: General Surgery

## 2024-05-01 ENCOUNTER — Encounter (HOSPITAL_BASED_OUTPATIENT_CLINIC_OR_DEPARTMENT_OTHER)
Admission: RE | Admit: 2024-05-01 | Discharge: 2024-05-01 | Disposition: A | Discharge status: Home / Self Care | Source: Ambulatory Visit | Attending: General Surgery | Admitting: General Surgery

## 2024-05-01 ENCOUNTER — Other Ambulatory Visit: Payer: Self-pay

## 2024-05-01 ENCOUNTER — Other Ambulatory Visit: Payer: Self-pay | Admitting: Internal Medicine

## 2024-05-01 DIAGNOSIS — Z794 Long term (current) use of insulin: Secondary | ICD-10-CM | POA: Insufficient documentation

## 2024-05-01 DIAGNOSIS — M25512 Pain in left shoulder: Secondary | ICD-10-CM

## 2024-05-01 DIAGNOSIS — E119 Type 2 diabetes mellitus without complications: Secondary | ICD-10-CM | POA: Insufficient documentation

## 2024-05-01 DIAGNOSIS — Z01810 Encounter for preprocedural cardiovascular examination: Secondary | ICD-10-CM | POA: Diagnosis present

## 2024-05-01 DIAGNOSIS — Z01818 Encounter for other preprocedural examination: Secondary | ICD-10-CM | POA: Insufficient documentation

## 2024-05-01 DIAGNOSIS — Z01812 Encounter for preprocedural laboratory examination: Secondary | ICD-10-CM | POA: Diagnosis present

## 2024-05-01 LAB — BASIC METABOLIC PANEL WITH GFR
Anion gap: 9 (ref 5–15)
BUN: 6 mg/dL — ABNORMAL LOW (ref 8–23)
CO2: 26 mmol/L (ref 22–32)
Calcium: 10.1 mg/dL (ref 8.9–10.3)
Chloride: 103 mmol/L (ref 98–111)
Creatinine, Ser: 0.53 mg/dL (ref 0.44–1.00)
GFR, Estimated: >60 mL/min (ref >60)
Glucose, Bld: 86 mg/dL (ref 70–99)
Potassium: 4.2 mmol/L (ref 3.5–5.1)
Sodium: 138 mmol/L (ref 135–145)

## 2024-05-01 MED ORDER — CHLORHEXIDINE GLUCONATE CLOTH 2 % EX PADS: 6.0000 | MEDICATED_PAD | Freq: Once | CUTANEOUS | Status: DC

## 2024-05-01 MED ORDER — CHLORHEXIDINE GLUCONATE CLOTH 2 % EX PADS
6.0000 | MEDICATED_PAD | Freq: Once | CUTANEOUS | Status: DC
Start: 2024-05-01 — End: 2024-05-07

## 2024-05-01 MED ORDER — IBUPROFEN 800 MG PO TABS
800.0000 mg | ORAL_TABLET | Freq: Three times a day (TID) | ORAL | 1 refills | Status: DC | PRN
  Filled 2024-05-01: qty 30, 10d supply, fill #0
  Filled 2024-05-30: qty 30, 10d supply, fill #1

## 2024-05-01 NOTE — Progress Notes (Signed)

## 2024-05-01 NOTE — Progress Notes (Signed)
   05/01/24 0955  PAT Phone Screen  Is the patient taking a GLP-1 receptor agonist? (S)  Yes  Has the patient been informed on holding medication? (S)  Yes (LD 04/24/24)  Do You Have Diabetes? (S)  Yes  Do You Have Hypertension? (S)  Yes  Have You Ever Been to the ER for Asthma? No  Have You Taken Oral Steroids in the Past 3 Months? No  Do you Take Phenteramine or any Other Diet Drugs? No  Recent  Lab Work, EKG, CXR? No  Do you have a history of heart problems? No  Any Recent Hospitalizations? No  Height 5' 4.5" (1.638 m)  Weight 66.7 kg  Pat Appointment Scheduled (S)  Yes (EKG, BMP)

## 2024-05-06 ENCOUNTER — Ambulatory Visit
Admission: RE | Admit: 2024-05-06 | Discharge: 2024-05-06 | Disposition: A | Discharge status: Home / Self Care | Source: Ambulatory Visit | Attending: General Surgery | Admitting: General Surgery

## 2024-05-06 ENCOUNTER — Other Ambulatory Visit (HOSPITAL_COMMUNITY): Payer: Self-pay

## 2024-05-06 DIAGNOSIS — D0511 Intraductal carcinoma in situ of right breast: Secondary | ICD-10-CM

## 2024-05-06 HISTORY — PX: BREAST BIOPSY: SHX20

## 2024-05-07 ENCOUNTER — Ambulatory Visit (HOSPITAL_BASED_OUTPATIENT_CLINIC_OR_DEPARTMENT_OTHER): Admitting: Anesthesiology

## 2024-05-07 ENCOUNTER — Encounter (HOSPITAL_BASED_OUTPATIENT_CLINIC_OR_DEPARTMENT_OTHER): Payer: Self-pay | Admitting: General Surgery

## 2024-05-07 ENCOUNTER — Ambulatory Visit (HOSPITAL_BASED_OUTPATIENT_CLINIC_OR_DEPARTMENT_OTHER)
Admission: RE | Admit: 2024-05-07 | Discharge: 2024-05-07 | Disposition: A | Discharge status: Home / Self Care | Attending: General Surgery | Admitting: General Surgery

## 2024-05-07 ENCOUNTER — Other Ambulatory Visit: Payer: Self-pay

## 2024-05-07 ENCOUNTER — Ambulatory Visit
Admission: RE | Admit: 2024-05-07 | Discharge: 2024-05-07 | Disposition: A | Discharge status: Home / Self Care | Source: Ambulatory Visit | Attending: General Surgery | Admitting: General Surgery

## 2024-05-07 ENCOUNTER — Other Ambulatory Visit (HOSPITAL_COMMUNITY): Payer: Self-pay

## 2024-05-07 ENCOUNTER — Encounter (HOSPITAL_BASED_OUTPATIENT_CLINIC_OR_DEPARTMENT_OTHER)
Admission: RE | Disposition: A | Discharge status: Home / Self Care | Payer: Self-pay | Source: Home / Self Care | Attending: General Surgery

## 2024-05-07 DIAGNOSIS — Z7984 Long term (current) use of oral hypoglycemic drugs: Secondary | ICD-10-CM | POA: Insufficient documentation

## 2024-05-07 DIAGNOSIS — E119 Type 2 diabetes mellitus without complications: Secondary | ICD-10-CM | POA: Diagnosis not present

## 2024-05-07 DIAGNOSIS — R921 Mammographic calcification found on diagnostic imaging of breast: Secondary | ICD-10-CM | POA: Diagnosis not present

## 2024-05-07 DIAGNOSIS — I10 Essential (primary) hypertension: Secondary | ICD-10-CM | POA: Diagnosis not present

## 2024-05-07 DIAGNOSIS — Z7985 Long-term (current) use of injectable non-insulin antidiabetic drugs: Secondary | ICD-10-CM | POA: Insufficient documentation

## 2024-05-07 DIAGNOSIS — D0511 Intraductal carcinoma in situ of right breast: Secondary | ICD-10-CM

## 2024-05-07 DIAGNOSIS — Z1721 Progesterone receptor positive status: Secondary | ICD-10-CM | POA: Diagnosis not present

## 2024-05-07 DIAGNOSIS — Z803 Family history of malignant neoplasm of breast: Secondary | ICD-10-CM | POA: Insufficient documentation

## 2024-05-07 DIAGNOSIS — Z170 Estrogen receptor positive status [ER+]: Secondary | ICD-10-CM | POA: Diagnosis not present

## 2024-05-07 DIAGNOSIS — Z79899 Other long term (current) drug therapy: Secondary | ICD-10-CM | POA: Diagnosis not present

## 2024-05-07 HISTORY — PX: BREAST LUMPECTOMY WITH RADIOACTIVE SEED LOCALIZATION: SHX6424

## 2024-05-07 LAB — GLUCOSE, CAPILLARY
Glucose-Capillary: 133 mg/dL — ABNORMAL HIGH (ref 70–99)
Glucose-Capillary: 111 mg/dL — ABNORMAL HIGH (ref 70–99)

## 2024-05-07 SURGERY — BREAST LUMPECTOMY WITH RADIOACTIVE SEED LOCALIZATION
Anesthesia: General | Site: Breast | Laterality: Right

## 2024-05-07 MED ORDER — FENTANYL CITRATE (PF) 100 MCG/2ML IJ SOLN
25.0000 ug | INTRAMUSCULAR | Status: DC | PRN
  Administered 2024-05-07: 25 ug via INTRAVENOUS

## 2024-05-07 MED ORDER — DEXAMETHASONE SODIUM PHOSPHATE 10 MG/ML IJ SOLN
INTRAMUSCULAR | Status: AC
  Filled 2024-05-07: qty 1

## 2024-05-07 MED ORDER — PHENYLEPHRINE HCL (PRESSORS) 10 MG/ML IV SOLN
INTRAVENOUS | Status: DC | PRN
  Administered 2024-05-07 (×4): 80 ug via INTRAVENOUS

## 2024-05-07 MED ORDER — LIDOCAINE 2% (20 MG/ML) 5 ML SYRINGE
INTRAMUSCULAR | Status: DC | PRN
  Administered 2024-05-07: 60 mg via INTRAVENOUS

## 2024-05-07 MED ORDER — OXYCODONE HCL 5 MG PO TABS
ORAL_TABLET | ORAL | Status: AC
Start: 2024-05-07 — End: ?
  Filled 2024-05-07: qty 1

## 2024-05-07 MED ORDER — ACETAMINOPHEN 500 MG PO TABS
1000.0000 mg | ORAL_TABLET | ORAL | Status: AC
  Administered 2024-05-07: 1000 mg via ORAL

## 2024-05-07 MED ORDER — DROPERIDOL 2.5 MG/ML IJ SOLN
0.6250 mg | Freq: Once | INTRAMUSCULAR | Status: DC | PRN
Start: 2024-05-07 — End: 2024-05-07

## 2024-05-07 MED ORDER — OXYCODONE HCL 5 MG/5ML PO SOLN: 5.0000 mg | Freq: Once | ORAL | Status: AC | PRN

## 2024-05-07 MED ORDER — GABAPENTIN 300 MG PO CAPS
ORAL_CAPSULE | ORAL | Status: AC
  Filled 2024-05-07: qty 1

## 2024-05-07 MED ORDER — FENTANYL CITRATE (PF) 100 MCG/2ML IJ SOLN
INTRAMUSCULAR | Status: AC
  Filled 2024-05-07: qty 2

## 2024-05-07 MED ORDER — FENTANYL CITRATE (PF) 100 MCG/2ML IJ SOLN
INTRAMUSCULAR | Status: DC | PRN
  Administered 2024-05-07 (×2): 50 ug via INTRAVENOUS

## 2024-05-07 MED ORDER — GABAPENTIN 100 MG PO CAPS
100.0000 mg | ORAL_CAPSULE | ORAL | Status: AC
  Administered 2024-05-07: 100 mg via ORAL

## 2024-05-07 MED ORDER — LACTATED RINGERS IV SOLN: INTRAVENOUS | Status: DC

## 2024-05-07 MED ORDER — EPHEDRINE SULFATE (PRESSORS) 50 MG/ML IJ SOLN
INTRAMUSCULAR | Status: DC | PRN
Start: 2024-05-07 — End: 2024-05-07
  Administered 2024-05-07: 5 mg via INTRAVENOUS

## 2024-05-07 MED ORDER — ONDANSETRON HCL 4 MG/2ML IJ SOLN
INTRAMUSCULAR | Status: DC | PRN
  Administered 2024-05-07: 4 mg via INTRAVENOUS

## 2024-05-07 MED ORDER — PROPOFOL 10 MG/ML IV BOLUS
INTRAVENOUS | Status: DC | PRN
  Administered 2024-05-07: 150 mg via INTRAVENOUS

## 2024-05-07 MED ORDER — CEFAZOLIN SODIUM-DEXTROSE 2-4 GM/100ML-% IV SOLN
INTRAVENOUS | Status: AC
  Filled 2024-05-07: qty 100

## 2024-05-07 MED ORDER — LIDOCAINE 2% (20 MG/ML) 5 ML SYRINGE
INTRAMUSCULAR | Status: AC
  Filled 2024-05-07: qty 5

## 2024-05-07 MED ORDER — ACETAMINOPHEN 500 MG PO TABS
ORAL_TABLET | ORAL | Status: AC
  Filled 2024-05-07: qty 2

## 2024-05-07 MED ORDER — BUPIVACAINE-EPINEPHRINE (PF) 0.25% -1:200000 IJ SOLN
INTRAMUSCULAR | Status: DC | PRN
  Administered 2024-05-07: 20 mL via PERINEURAL

## 2024-05-07 MED ORDER — DEXAMETHASONE SODIUM PHOSPHATE 4 MG/ML IJ SOLN
INTRAMUSCULAR | Status: DC | PRN
  Administered 2024-05-07: 4 mg via INTRAVENOUS

## 2024-05-07 MED ORDER — EPHEDRINE 5 MG/ML INJ
INTRAVENOUS | Status: AC
  Filled 2024-05-07: qty 5

## 2024-05-07 MED ORDER — GABAPENTIN 100 MG PO CAPS
ORAL_CAPSULE | ORAL | Status: AC
  Filled 2024-05-07: qty 1

## 2024-05-07 MED ORDER — CEFAZOLIN SODIUM-DEXTROSE 2-4 GM/100ML-% IV SOLN
2.0000 g | INTRAVENOUS | Status: AC
  Administered 2024-05-07: 2 g via INTRAVENOUS

## 2024-05-07 MED ORDER — ONDANSETRON HCL 4 MG/2ML IJ SOLN
INTRAMUSCULAR | Status: AC
Start: 2024-05-07 — End: ?
  Filled 2024-05-07: qty 2

## 2024-05-07 MED ORDER — OXYCODONE HCL 5 MG PO TABS
5.0000 mg | ORAL_TABLET | Freq: Once | ORAL | Status: AC | PRN
  Administered 2024-05-07: 5 mg via ORAL

## 2024-05-07 MED ORDER — OXYCODONE HCL 5 MG PO TABS
5.0000 mg | ORAL_TABLET | Freq: Four times a day (QID) | ORAL | 0 refills | Status: DC | PRN
  Filled 2024-05-07: qty 15, 4d supply, fill #0

## 2024-05-07 MED ORDER — PHENYLEPHRINE 80 MCG/ML (10ML) SYRINGE FOR IV PUSH (FOR BLOOD PRESSURE SUPPORT)
PREFILLED_SYRINGE | INTRAVENOUS | Status: AC
Start: 2024-05-07 — End: ?
  Filled 2024-05-07: qty 10

## 2024-05-07 MED ORDER — ACETAMINOPHEN 10 MG/ML IV SOLN: 1000.0000 mg | Freq: Once | INTRAVENOUS | Status: DC | PRN

## 2024-05-07 MED ORDER — 0.9 % SODIUM CHLORIDE (POUR BTL) OPTIME
TOPICAL | Status: DC | PRN
  Administered 2024-05-07: 1000 mL

## 2024-05-07 SURGICAL SUPPLY — 34 items
BINDER BREAST LRG (GAUZE/BANDAGES/DRESSINGS) IMPLANT
BLADE SURG 15 STRL LF DISP TIS (BLADE) ×1 IMPLANT
CANISTER SUC SOCK COL 7IN (MISCELLANEOUS) ×1 IMPLANT
CANISTER SUCT 1200ML W/VALVE (MISCELLANEOUS) ×1 IMPLANT
CHLORAPREP W/TINT 26 (MISCELLANEOUS) ×1 IMPLANT
CLIP APPLIE 9.375 MED OPEN (MISCELLANEOUS) IMPLANT
COVER BACK TABLE 60X90IN (DRAPES) ×1 IMPLANT
COVER MAYO STAND STRL (DRAPES) ×1 IMPLANT
COVER PROBE CYLINDRICAL 5X96 (MISCELLANEOUS) ×1 IMPLANT
DERMABOND ADVANCED .7 DNX12 (GAUZE/BANDAGES/DRESSINGS) ×1 IMPLANT
DRAPE LAPAROSCOPIC ABDOMINAL (DRAPES) ×1 IMPLANT
DRAPE UTILITY XL STRL (DRAPES) ×1 IMPLANT
ELECT COATED BLADE 2.86 ST (ELECTRODE) ×1 IMPLANT
ELECTRODE REM PT RTRN 9FT ADLT (ELECTROSURGICAL) ×1 IMPLANT
GLOVE BIO SURGEON STRL SZ7.5 (GLOVE) ×2 IMPLANT
GOWN STRL REUS W/ TWL LRG LVL3 (GOWN DISPOSABLE) ×2 IMPLANT
KIT MARKER MARGIN INK (KITS) ×1 IMPLANT
NDL HYPO 25X1 1.5 SAFETY (NEEDLE) IMPLANT
NEEDLE HYPO 25X1 1.5 SAFETY (NEEDLE) ×1 IMPLANT
NS IRRIG 1000ML POUR BTL (IV SOLUTION) IMPLANT
PACK BASIN DAY SURGERY FS (CUSTOM PROCEDURE TRAY) ×1 IMPLANT
PENCIL SMOKE EVACUATOR (MISCELLANEOUS) ×1 IMPLANT
SLEEVE SCD COMPRESS KNEE MED (STOCKING) ×1 IMPLANT
SPIKE FLUID TRANSFER (MISCELLANEOUS) IMPLANT
SPONGE T-LAP 18X18 ~~LOC~~+RFID (SPONGE) ×1 IMPLANT
SUT MNCRL AB 4-0 PS2 18 (SUTURE) IMPLANT
SUT MON AB 4-0 PC3 18 (SUTURE) ×1 IMPLANT
SUT SILK 2 0 SH (SUTURE) IMPLANT
SUT VICRYL 3-0 CR8 SH (SUTURE) ×1 IMPLANT
SYR CONTROL 10ML LL (SYRINGE) IMPLANT
TOWEL GREEN STERILE FF (TOWEL DISPOSABLE) ×1 IMPLANT
TRAY FAXITRON CT DISP (TRAY / TRAY PROCEDURE) ×1 IMPLANT
TUBE CONNECTING 20X1/4 (TUBING) ×1 IMPLANT
YANKAUER SUCT BULB TIP NO VENT (SUCTIONS) IMPLANT

## 2024-05-07 NOTE — Anesthesia Postprocedure Evaluation (Signed)
 Anesthesia Post Note  Patient: Michele Andrews  Procedure(s) Performed: BREAST LUMPECTOMY WITH RADIOACTIVE SEED LOCALIZATION (Right: Breast)     Patient location during evaluation: PACU Anesthesia Type: General Level of consciousness: awake and alert Pain management: pain level controlled Vital Signs Assessment: post-procedure vital signs reviewed and stable Respiratory status: spontaneous breathing, nonlabored ventilation, respiratory function stable and patient connected to nasal cannula oxygen Cardiovascular status: blood pressure returned to baseline and stable Postop Assessment: no apparent nausea or vomiting Anesthetic complications: no  No notable events documented.  Last Vitals:  Vitals:   05/07/24 1200 05/07/24 1231  BP: 135/69 129/80  Pulse: 76 81  Resp: 11 14  Temp:  (!) 36.3 C  SpO2: 96% 95%    Last Pain:  Vitals:   05/07/24 1231  TempSrc: Temporal  PainSc: 3                  Willian Harrow

## 2024-05-07 NOTE — H&P (Signed)
 REFERRING PHYSICIAN: Derril Flint* PROVIDER: Arlester Bence, MD MRN: [id number] DOB: [birth year] Subjective    Chief Complaint: New Consultation (Br.Ca)  History of Present Illness: Michele Andrews is a 73 y.o. female who is seen today as an office consultation for evaluation of New Consultation (Br.Ca)  We are asked to see the patient in consultation by Dr. Lasandra Points to evaluate her for a new right breast cancer. The patient is a 73 year old black female who recently went for a routine screening mammogram. At that time she was found to have 2 small clusters of calcification in the lower outer quadrant of the right breast measuring 5 mm and 6 mm. 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 She does have a family history of breast cancer in her mother. She does not smoke.  Review of Systems: A complete review of systems was obtained from the patient. I have reviewed this information and discussed as appropriate with the patient. See HPI as well for other ROS.  ROS   Medical History: Past Medical History:  Diagnosis Date  Diabetes mellitus without complication (CMS/HHS-HCC)  Hyperlipidemia  Hypertension   Patient Active Problem List  Diagnosis  Ductal carcinoma in situ (DCIS) of right breast   History reviewed. No pertinent surgical history.   No Known Allergies  Current Outpatient Medications on File Prior to Visit  Medication Sig Dispense Refill  amLODIPine  (NORVASC ) 10 MG tablet Take 1 tablet by mouth once daily  artificial tears (GONIOSCOP-HPM) 2.5 % ophthalmic solution Apply 1-2 drops to eye  ascorbic acid (VITA-C ORAL) Take by mouth  atorvastatin  (LIPITOR) 40 MG tablet Take 40 mg by mouth once daily  blood glucose diagnostic (FREESTYLE LITE STRIPS) test strip Use to check blood sugar 2 times a day.  cholecalciferol (VITAMIN D3) 2,000 unit tablet Take 2,000 Units by mouth once daily  ibuprofen   (MOTRIN ) 800 MG tablet Take 800 mg by mouth every 8 (eight) hours as needed  lisinopriL  (ZESTRIL ) 20 MG tablet Take 20 mg by mouth once daily  metFORMIN  (GLUCOPHAGE ) 1000 MG tablet Take 1,000 mg by mouth 2 (two) times daily with meals  metoprolol  SUCCinate (TOPROL -XL) 25 MG XL tablet Take 25 mg by mouth  omega-3 fatty acids/fish oil (OMEGA 3 FISH OIL ORAL) Take by mouth  TRULICITY  4.5 mg/0.5 mL subcutaneous pen injector Inject 4.5 mg as directed   No current facility-administered medications on file prior to visit.   Family History  Problem Relation Age of Onset  Breast cancer Mother  Breast cancer Sister    Social History   Tobacco Use  Smoking Status Never  Smokeless Tobacco Never    Social History   Socioeconomic History  Marital status: Married  Tobacco Use  Smoking status: Never  Smokeless tobacco: Never  Vaping Use  Vaping status: Never Used  Substance and Sexual Activity  Alcohol use: Not Currently  Drug use: Never   Social Drivers of Health   Food Insecurity: No Food Insecurity (11/23/2022)  Received from [facility]  Hunger Vital Sign  Worried About Running Out of Food in the Last Year: Never true  Ran Out of Food in the Last Year: Never true  Transportation Needs: Unknown (11/23/2022)  Received from [facility] - Transportation  Lack of Transportation (Medical): No  Social Connections: Moderately Integrated (11/23/2022)  Received from [facility]  Social Connection and Isolation Panel [NHANES]  Frequency of Communication with Friends and  Family: More than three times a week  Frequency of Social Gatherings with Friends and Family: More than three times a week  Attends Religious Services: More than 4 times per year  Active Member of [employer] or Organizations: No  Attends Banker Meetings: Never  Marital Status: Married  Housing Stability: Unknown (04/16/2024)  Housing Stability Vital Sign  Homeless in the Last Year: No   Objective:    Vitals:  BP: 133/82  Pulse: 104  Temp: 36.3 C (97.4 F)  SpO2: 99%  Weight: 66.4 kg (146 lb 6.4 oz)  Height: 163.8 cm (5' 4.5")  PainSc: 0-No pain  PainLoc: Breast   Body mass index is 24.74 kg/m.  Physical Exam Vitals reviewed.  Constitutional:  General: She is not in acute distress. Appearance: Normal appearance.  HENT:  Head: Normocephalic and atraumatic.  Right Ear: External ear normal.  Left Ear: External ear normal.  Nose: Nose normal.  Mouth/Throat:  Mouth: Mucous membranes are moist.  Pharynx: Oropharynx is clear.  Eyes:  General: No scleral icterus. Extraocular Movements: Extraocular movements intact.  Conjunctiva/sclera: Conjunctivae normal.  Pupils: Pupils are equal, round, and reactive to light.  Cardiovascular:  Rate and Rhythm: Normal rate and regular rhythm.  Pulses: Normal pulses.  Heart sounds: Normal heart sounds.  Pulmonary:  Effort: Pulmonary effort is normal. No respiratory distress.  Breath sounds: Normal breath sounds.  Abdominal:  General: Bowel sounds are normal.  Palpations: Abdomen is soft.  Tenderness: There is no abdominal tenderness.  Musculoskeletal:  General: No swelling, tenderness or deformity. Normal range of motion.  Cervical back: Normal range of motion and neck supple.  Skin: General: Skin is warm and dry.  Coloration: Skin is not jaundiced.  Neurological:  General: No focal deficit present.  Mental Status: She is alert and oriented to person, place, and time.  Psychiatric:  Mood and Affect: Mood normal.  Behavior: Behavior normal.     Breast: There is no palpable mass in either breast. There is no palpable axillary, supraclavicular, or cervical lymphadenopathy.  Labs, Imaging and Diagnostic Testing:  Assessment and Plan:   Diagnoses and all orders for this visit:  Ductal carcinoma in situ (DCIS) of right breast - Ambulatory Referral to Oncology-Medical - Ambulatory Referral to Radiation Oncology - [facility] Case  Posting Request; Future   The patient appears to have 2 small areas of ductal carcinoma in situ in the lower outer quadrant of the right breast. I have discussed with her in detail the different options for treatment and at this point she favors breast conservation which I feel is very reasonable. She will not need a node evaluation. I have discussed with her in detail the risks and benefits of the operation as well as some of the technical aspects including the use of 2 radioactive seeds and she understands and wishes to proceed. I will move forward with surgical scheduling. I will also refer her to medical and radiation oncology to discuss adjuvant therapy

## 2024-05-07 NOTE — Transfer of Care (Signed)
 Immediate Anesthesia Transfer of Care Note  Patient: Michele Andrews  Procedure(s) Performed: BREAST LUMPECTOMY WITH RADIOACTIVE SEED LOCALIZATION (Right: Breast)  Patient Location: PACU  Anesthesia Type:General  Level of Consciousness: drowsy  Airway & Oxygen Therapy: Patient Spontanous Breathing and Patient connected to face mask oxygen  Post-op Assessment: Report given to RN and Post -op Vital signs reviewed and stable  Post vital signs: Reviewed and stable  Last Vitals:  Vitals Value Taken Time  BP 125/75 05/07/24 1122  Temp 36.2 C 05/07/24 1122  Pulse 86 05/07/24 1124  Resp 13 05/07/24 1124  SpO2 97 % 05/07/24 1124  Vitals shown include unfiled device data.  Last Pain:  Vitals:   05/07/24 0904  TempSrc: Oral  PainSc: 0-No pain      Patients Stated Pain Goal: 6 (05/07/24 0904)  Complications: No notable events documented.

## 2024-05-07 NOTE — Interval H&P Note (Signed)
 History and Physical Interval Note:  05/07/2024 11:24 AM  Michele Andrews  has presented today for surgery, with the diagnosis of RIGHT BREAST DCIS.  The various methods of treatment have been discussed with the patient and family. After consideration of risks, benefits and other options for treatment, the patient has consented to  Procedure(s) with comments: BREAST LUMPECTOMY WITH RADIOACTIVE SEED LOCALIZATION (Right) - RIGHT BREAST RADIOACTIVE SEED LOCALIZED LUMPECTOMY x2 as a surgical intervention.  The patient's history has been reviewed, patient examined, no change in status, stable for surgery.  I have reviewed the patient's chart and labs.  Questions were answered to the patient's satisfaction.     Lillette Reid III

## 2024-05-07 NOTE — Discharge Instructions (Addendum)
  Post Anesthesia Home Care Instructions  Activity: Get plenty of rest for the remainder of the day. A responsible individual must stay with you for 24 hours following the procedure.  For the next 24 hours, DO NOT: -Drive a car -Advertising copywriter -Drink alcoholic beverages -Take any medication unless instructed by your physician -Make any legal decisions or sign important papers.  Meals: Start with liquid foods such as gelatin or soup. Progress to regular foods as tolerated. Avoid greasy, spicy, heavy foods. If nausea and/or vomiting occur, drink only clear liquids until the nausea and/or vomiting subsides. Call your physician if vomiting continues.  Special Instructions/Symptoms: Your throat may feel dry or sore from the anesthesia or the breathing tube placed in your throat during surgery. If this causes discomfort, gargle with warm salt water. The discomfort should disappear within 24 hours.  If you had a scopolamine patch placed behind your ear for the management of post- operative nausea and/or vomiting:  1. The medication in the patch is effective for 72 hours, after which it should be removed.  Wrap patch in a tissue and discard in the trash. Wash hands thoroughly with soap and water. 2. You may remove the patch earlier than 72 hours if you experience unpleasant side effects which may include dry mouth, dizziness or visual disturbances. 3. Avoid touching the patch. Wash your hands with soap and water after contact with the patch.    No Tylenol  until 3:00pm today, if needed.

## 2024-05-07 NOTE — Op Note (Signed)
 05/07/2024  11:15 AM  PATIENT:  Michele Andrews  73 y.o. female  PRE-OPERATIVE DIAGNOSIS:  RIGHT BREAST DCIS  POST-OPERATIVE DIAGNOSIS:  RIGHT BREAST DCIS  PROCEDURE:  Procedure(s) with comments: RIGHT BREAST LUMPECTOMY WITH RADIOACTIVE SEED LOCALIZATION  X 2  SURGEON:  Surgeons and Role:    * Caralyn Chandler, MD - Primary  PHYSICIAN ASSISTANT:   ASSISTANTS: none   ANESTHESIA:   local and general  EBL:  10 mL   BLOOD ADMINISTERED:none  DRAINS: none   LOCAL MEDICATIONS USED:  MARCAINE      SPECIMEN:  Source of Specimen:  right breast tissue with additional medial and deep margins  DISPOSITION OF SPECIMEN:  PATHOLOGY  COUNTS:  YES  TOURNIQUET:  * No tourniquets in log *  DICTATION: .Dragon Dictation  After informed consent was obtained the patient was brought to the operating room and placed in the supine position on the operating table.  After adequate induction of general anesthesia the patient's right breast was prepped with ChloraPrep, allowed to dry, and draped in usual sterile manner.  An appropriate timeout was performed.  Previously 2 I-125 seeds were placed in the lower outer right breast to mark 2 areas of ductal carcinoma in situ.  The neoprobe was set to I-125 in the area of radioactivity was readily identified.  The area around this was infiltrated with quarter percent Marcaine .  I elected to make an elliptical radially oriented incision on the lower outer right breast overlying the area of radioactivity.  The incision was carried through the skin and subcutaneous tissue sharply with electrocautery.  Dissection was then carried widely around the 2 radioactive seeds while checking the area of radioactivity frequently.  Once the tissue was removed it was oriented with the appropriate paint colors.  A specimen radiograph showed the 2 seeds and 1 clip to be within the specimen.  I did elect to take an additional medial and deep margin and these were marked appropriately.   All of the tissue was then sent to pathology for further evaluation.  Hemostasis was achieved using the Bovie electrocautery.  The wound was irrigated with saline and infiltrated with more quarter percent Marcaine .  The cavity was marked with clips.  The deep layer of the wound was then closed with layers of interrupted 3-0 Vicryl stitches.  The skin was then closed with interrupted 4-0 Monocryl subcuticular stitches.  Dermabond dressings were applied.  The patient tolerated the procedure well.  At the end of the case all needle sponge and instrument counts were correct.  The patient was then awakened and taken to recovery in stable condition.  PLAN OF CARE: Discharge to home after PACU  PATIENT DISPOSITION:  PACU - hemodynamically stable.   Delay start of Pharmacological VTE agent (>24hrs) due to surgical blood loss or risk of bleeding: not applicable

## 2024-05-07 NOTE — Anesthesia Procedure Notes (Signed)
 Procedure Name: LMA Insertion Date/Time: 05/07/2024 10:18 AM  Performed by: Sol Duos, CRNAPre-anesthesia Checklist: Patient identified, Emergency Drugs available, Suction available and Patient being monitored Patient Re-evaluated:Patient Re-evaluated prior to induction Oxygen Delivery Method: Circle system utilized Preoxygenation: Pre-oxygenation with 100% oxygen Induction Type: IV induction Ventilation: Mask ventilation without difficulty LMA: LMA inserted LMA Size: 4.0 Number of attempts: 1 Placement Confirmation: positive ETCO2 and breath sounds checked- equal and bilateral Tube secured with: Tape Dental Injury: Teeth and Oropharynx as per pre-operative assessment

## 2024-05-07 NOTE — Anesthesia Preprocedure Evaluation (Addendum)
 Anesthesia Evaluation  Patient identified by MRN, date of birth, ID band Patient awake    Reviewed: Allergy & Precautions, NPO status , Patient's Chart, lab work & pertinent test results, reviewed documented beta blocker date and time   Airway Mallampati: I  TM Distance: >3 FB Neck ROM: Full    Dental  (+) Teeth Intact, Dental Advisory Given   Pulmonary    breath sounds clear to auscultation       Cardiovascular hypertension, Pt. on medications and Pt. on home beta blockers  Rhythm:Regular Rate:Normal     Neuro/Psych  Headaches  Neuromuscular disease  negative psych ROS   GI/Hepatic Neg liver ROS,GERD  ,,  Endo/Other  diabetes, Type 2, Oral Hypoglycemic Agents    Renal/GU negative Renal ROS     Musculoskeletal  (+) Arthritis ,    Abdominal   Peds  Hematology negative hematology ROS (+)   Anesthesia Other Findings   Reproductive/Obstetrics                             Anesthesia Physical Anesthesia Plan  ASA: 2  Anesthesia Plan: General   Post-op Pain Management: Tylenol  PO (pre-op)* and Gabapentin  PO (pre-op)*   Induction: Intravenous  PONV Risk Score and Plan: 4 or greater and Ondansetron , Dexamethasone  and Treatment may vary due to age or medical condition  Airway Management Planned: LMA  Additional Equipment: None  Intra-op Plan:   Post-operative Plan: Extubation in OR  Informed Consent: I have reviewed the patients History and Physical, chart, labs and discussed the procedure including the risks, benefits and alternatives for the proposed anesthesia with the patient or authorized representative who has indicated his/her understanding and acceptance.     Dental advisory given  Plan Discussed with: CRNA  Anesthesia Plan Comments:        Anesthesia Quick Evaluation

## 2024-05-08 ENCOUNTER — Encounter (HOSPITAL_BASED_OUTPATIENT_CLINIC_OR_DEPARTMENT_OTHER): Payer: Self-pay | Admitting: General Surgery

## 2024-05-12 ENCOUNTER — Ambulatory Visit: Payer: Self-pay | Admitting: General Surgery

## 2024-05-12 ENCOUNTER — Encounter: Payer: Self-pay | Admitting: *Deleted

## 2024-05-12 DIAGNOSIS — D0511 Intraductal carcinoma in situ of right breast: Secondary | ICD-10-CM

## 2024-05-13 ENCOUNTER — Encounter: Payer: Self-pay | Admitting: *Deleted

## 2024-05-13 ENCOUNTER — Telehealth: Payer: Self-pay | Admitting: Radiation Oncology

## 2024-05-13 NOTE — Telephone Encounter (Signed)
 Left message for patient to call back to schedule consult per 6/2 referral.

## 2024-05-14 ENCOUNTER — Telehealth: Payer: Self-pay | Admitting: Radiation Oncology

## 2024-05-14 NOTE — Telephone Encounter (Signed)
 Called patient to schedule consult per 6/2 referral. Patient stated she is undecided and is scheduled to see Dr. Alethea Andes next week. She will speak to him regarding the decision and be referred back if needed.

## 2024-05-23 ENCOUNTER — Encounter: Payer: Self-pay | Admitting: *Deleted

## 2024-05-26 ENCOUNTER — Telehealth: Payer: Self-pay

## 2024-05-26 NOTE — Telephone Encounter (Signed)
 Verbally confirmed appt for 6/17

## 2024-05-27 ENCOUNTER — Inpatient Hospital Stay: Attending: Hematology and Oncology | Admitting: Hematology and Oncology

## 2024-05-27 ENCOUNTER — Other Ambulatory Visit: Payer: Self-pay

## 2024-05-27 ENCOUNTER — Other Ambulatory Visit (HOSPITAL_COMMUNITY): Payer: Self-pay

## 2024-05-27 VITALS — BP 134/72 | HR 103 | Temp 98.7°F | Resp 17 | Wt 146.7 lb

## 2024-05-27 DIAGNOSIS — M8580 Other specified disorders of bone density and structure, unspecified site: Secondary | ICD-10-CM | POA: Diagnosis not present

## 2024-05-27 DIAGNOSIS — Z8505 Personal history of malignant neoplasm of liver: Secondary | ICD-10-CM | POA: Diagnosis not present

## 2024-05-27 DIAGNOSIS — Z79811 Long term (current) use of aromatase inhibitors: Secondary | ICD-10-CM | POA: Insufficient documentation

## 2024-05-27 DIAGNOSIS — Z803 Family history of malignant neoplasm of breast: Secondary | ICD-10-CM | POA: Diagnosis not present

## 2024-05-27 DIAGNOSIS — Z90710 Acquired absence of both cervix and uterus: Secondary | ICD-10-CM | POA: Diagnosis not present

## 2024-05-27 DIAGNOSIS — Z170 Estrogen receptor positive status [ER+]: Secondary | ICD-10-CM | POA: Insufficient documentation

## 2024-05-27 DIAGNOSIS — Z1721 Progesterone receptor positive status: Secondary | ICD-10-CM | POA: Diagnosis not present

## 2024-05-27 DIAGNOSIS — D0511 Intraductal carcinoma in situ of right breast: Secondary | ICD-10-CM | POA: Diagnosis not present

## 2024-05-27 MED ORDER — ANASTROZOLE 1 MG PO TABS
1.0000 mg | ORAL_TABLET | Freq: Every day | ORAL | 1 refills | Status: DC
  Filled 2024-05-27: qty 90, 90d supply, fill #0
  Filled 2024-08-19: qty 90, 90d supply, fill #1

## 2024-05-27 NOTE — Progress Notes (Signed)
 [facility] CONSULT NOTE  Patient Care Team: Lasandra Points, MD as PCP - General (Internal Medicine) Kathrin Pares, OD as Consulting Physician (Optometry) Auther Bo, RN as Oncology Nurse Navigator Alane Hsu, RN as Oncology Nurse Navigator Murleen Arms, MD as Consulting Physician (Hematology and Oncology)  CHIEF COMPLAINTS/PURPOSE OF CONSULTATION:  Newly diagnosed breast cancer  HISTORY OF PRESENTING ILLNESS:  Michele Andrews 73 y.o. female is here because of recent diagnosis of {left/right:[id number]}   I reviewed her records extensively and collaborated the history with the patient.  SUMMARY OF ONCOLOGIC HISTORY: Oncology History   No history exists.    Diagnosis:   Date of Diagnosis: Stage:  Treatment:  Last Mammogram:  Last Bone Density:  Genetics:    MEDICAL HISTORY:  Past Medical History:  Diagnosis Date   Breast cancer (HCC) 04/09/2024   Carpal tunnel syndrome 03/30/2010   Diabetes mellitus    Type II- diagnosed 30 yrs. ago   Dry eye 11/09/2015   Family history of adverse reaction to anesthesia    20 YRS. AGO, HER 19 YR. OLD NEPHEW, ENDED UP ON VENTILATOR- PT. THINKS ITS BECAUSE HE ASPIRATED    Hypercalcemia    Hyperlipidemia    Hypertension    Osteoarthritis of left knee 02/23/2016   Osteopenia 09/20/1999   Rotator cuff syndrome of right shoulder    Vitamin D  insufficiency 04/12/2015    SURGICAL HISTORY: Past Surgical History:  Procedure Laterality Date   ABDOMINAL HYSTERECTOMY     BREAST BIOPSY Right 04/09/2024   MM RT BREAST BX W LOC DEV 1ST LESION IMAGE BX SPEC STEREO GUIDE 04/09/2024 [facility]   BREAST BIOPSY Right 04/09/2024   MM RT BREAST BX W LOC DEV EA AD LESION IMG BX SPEC STEREO GUIDE 04/09/2024 [facility]   BREAST BIOPSY  05/06/2024   MM RT RADIOACTIVE SEED EA ADD LESION LOC MAMMO GUIDE 05/06/2024 [facility]   BREAST BIOPSY  05/06/2024   MM RT RADIOACTIVE SEED LOC MAMMO GUIDE 05/06/2024  [facility]   BREAST CYST EXCISION Left    BREAST LUMPECTOMY WITH RADIOACTIVE SEED LOCALIZATION Right 05/07/2024   Procedure: BREAST LUMPECTOMY WITH RADIOACTIVE SEED LOCALIZATION;  Surgeon: Caralyn Chandler, MD;  Location: [facility];  Service: General;  Laterality: Right;  RIGHT BREAST RADIOACTIVE SEED LOCALIZED LUMPECTOMY x2   BREAST SURGERY Left 1963   cyst removed    PARATHYROIDECTOMY Right 11/08/2015   PARATHYROIDECTOMY N/A 11/08/2015   Procedure: RIGHT INFERIOR PARATHYROIDECTOMY;  Surgeon: Oralee Billow, MD;  Location: [facility];  Service: General;  Laterality: N/A;    SOCIAL HISTORY: Social History   Socioeconomic History   Marital status: Married    Spouse name: Not on file   Number of children: Not on file   Years of education: 12   Highest education level: Not on file  Occupational History   Occupation: nurse tech    Employer: [employer]  Tobacco Use   Smoking status: Never   Smokeless tobacco: Never  Substance and Sexual Activity   Alcohol use: No   Drug use: No   Sexual activity: Not on file  Other Topics Concern   Not on file  Social History Narrative   Not on file   Social Drivers of Health   Financial Resource Strain: Not on file  Food Insecurity: No Food Insecurity (04/22/2024)   Hunger Vital Sign    Worried About Running Out of Food in the Last Year: Never true    Ran  Out of Food in the Last Year: Never true  Transportation Needs: No Transportation Needs (04/22/2024)   PRAPARE - Administrator, Civil Service (Medical): No    Lack of Transportation (Non-Medical): No  Physical Activity: Not on file  Stress: Not on file  Social Connections: Moderately Integrated (11/23/2022)   Social Connection and Isolation Panel    Frequency of Communication with Friends and Family: More than three times a week    Frequency of Social Gatherings with Friends and Family: More than three times a week    Attends Religious Services: More than 4  times per year    Active Member of [employer] or Organizations: No    Attends Banker Meetings: Never    Marital Status: Married  Catering manager Violence: Not At Risk (04/22/2024)   Humiliation, Afraid, Rape, and Kick questionnaire    Fear of Current or Ex-Partner: No    Emotionally Abused: No    Physically Abused: No    Sexually Abused: No    FAMILY HISTORY: Family History  Problem Relation Age of Onset   Hypertension Mother    Cancer Mother    Breast cancer Mother 60   Breast cancer Sister 37   Breast cancer Maternal Aunt 50   Breast cancer Cousin 48    ALLERGIES:  has no known allergies.  MEDICATIONS:  Current Outpatient Medications  Medication Sig Dispense Refill   acetaminophen  (TYLENOL ) 650 MG CR tablet Take 650 mg by mouth every 8 (eight) hours as needed for pain.     amLODipine  (NORVASC ) 10 MG tablet Take 1 tablet (10 mg total) by mouth daily. 30 tablet 11   atorvastatin  (LIPITOR) 40 MG tablet Take 1 tablet (40 mg total) by mouth daily. 90 tablet 3   Blood Glucose Monitoring Suppl (FREESTYLE FREEDOM LITE) w/Device KIT 1 each by Does not apply route 3 (three) times daily before meals. 1 kit 0   Blood Glucose Monitoring Suppl (TRUETRACK BLOOD GLUCOSE) W/DEVICE KIT Use to check blood sugar 2 times a day      Cholecalciferol (VITAMIN D3) 2000 units TABS Take 2,000 Units by mouth daily.     Continuous Glucose Sensor (FREESTYLE LIBRE 3 SENSOR) MISC Place 1 sensor on the skin every 14 days. Use to check glucose continuously 2 each 11   cyclobenzaprine  (FLEXERIL ) 5 MG tablet Take 1 tablet (5 mg total) by mouth at bedtime as needed for muscle spasms. 5 tablet 0   diclofenac  Sodium (VOLTAREN ) 1 % GEL Apply 2 grams topically 4 (four) times daily. 300 g 0   diclofenac  Sodium (VOLTAREN ) 1 % GEL Apply 2 g topically 4 (four) times daily. 300 g 0   Dulaglutide  (TRULICITY ) 4.5 MG/0.5ML SOAJ Inject 4.5 mg as directed once a week. 2 mL 2   glucose blood (FREESTYLE LITE) test  strip Use to check blood sugar 2 times a day. 100 each 12   hydroxypropyl methylcellulose / hypromellose (ISOPTO TEARS / GONIOVISC) 2.5 % ophthalmic solution Place 1-2 drops into both eyes 3 (three) times daily as needed for dry eyes.     ibuprofen  (ADVIL ) 800 MG tablet Take 1 tablet (800 mg total) by mouth every 8 (eight) hours as needed for pain. 30 tablet 1   Lancets (FREESTYLE) lancets Use 2 times daily to check blood sugar 300 each 1   lisinopril  (ZESTRIL ) 20 MG tablet Take 1 tablet (20 mg total) by mouth daily. 90 tablet 3   metFORMIN  (GLUCOPHAGE ) 1000 MG tablet Take 1  tablet (1,000 mg total) by mouth 2 (two) times daily with a meal. 180 tablet 3   metoprolol  succinate (TOPROL -XL) 25 MG 24 hr tablet Take 1 tablet (25 mg total) by mouth daily before breakfast. 90 tablet 3   oxyCODONE  (ROXICODONE ) 5 MG immediate release tablet Take 1 tablet (5 mg total) by mouth every 6 (six) hours as needed for severe pain (pain score 7-10). 15 tablet 0   No current facility-administered medications for this visit.    REVIEW OF SYSTEMS:   Constitutional: Denies fevers, chills or abnormal night sweats Eyes: Denies blurriness of vision, double vision or watery eyes Ears, nose, mouth, throat, and face: Denies mucositis or sore throat Respiratory: Denies cough, dyspnea or wheezes Cardiovascular: Denies palpitation, chest discomfort or lower extremity swelling Gastrointestinal:  Denies nausea, heartburn or change in bowel habits Skin: Denies abnormal skin rashes Lymphatics: Denies new lymphadenopathy or easy bruising Neurological:Denies numbness, tingling or new weaknesses Behavioral/Psych: Mood is stable, no new changes  Breast: *** Denies any palpable lumps or discharge All other systems were reviewed with the patient and are negative.  PHYSICAL EXAMINATION: ECOG PERFORMANCE STATUS: {CHL ONC ECOG ON:[id number]}  Vitals:   05/27/24 1443  BP: 134/72  Pulse: (!) 103  Resp: 17  Temp: 98.7 F (37.1 C)   SpO2: 98%   Filed Weights   05/27/24 1443  Weight: 146 lb 11.2 oz (66.5 kg)    GENERAL:alert, no distress and comfortable SKIN: skin color, texture, turgor are normal, no rashes or significant lesions EYES: normal, conjunctiva are pink and non-injected, sclera clear OROPHARYNX:no exudate, no erythema and lips, buccal mucosa, and tongue normal  NECK: supple, thyroid  normal size, non-tender, without nodularity LYMPH:  no palpable lymphadenopathy in the cervical, axillary or inguinal LUNGS: clear to auscultation and percussion with normal breathing effort HEART: regular rate & rhythm and no murmurs and no lower extremity edema ABDOMEN:abdomen soft, non-tender and normal bowel sounds Musculoskeletal:no cyanosis of digits and no clubbing  PSYCH: alert & oriented x 3 with fluent speech NEURO: no focal motor/sensory deficits BREAST:*** No palpable nodules in breast. No palpable axillary or supraclavicular lymphadenopathy (exam performed in the presence of a chaperone)   LABORATORY DATA:  I have reviewed the data as listed Lab Results  Component Value Date   WBC 8.4 10/15/2019   HGB 12.4 10/15/2019   HCT 37.7 10/15/2019   MCV 92 10/15/2019   PLT 224 10/15/2019   Lab Results  Component Value Date   NA 138 05/01/2024   K 4.2 05/01/2024   CL 103 05/01/2024   CO2 26 05/01/2024    RADIOGRAPHIC STUDIES: I have personally reviewed the radiological reports and agreed with the findings in the report.  ASSESSMENT AND PLAN:  No problem-specific Assessment & Plan notes found for this encounter.   All questions were answered. The patient knows to call the clinic with any problems, questions or concerns.    Murleen Arms, MD 05/27/24

## 2024-05-29 NOTE — Assessment & Plan Note (Signed)
 Assessment and Plan Assessment & Plan Ductal carcinoma in situ (DCIS) of right breast DCIS in the lower outer quadrant, ER/PR positive. Lumpectomy with clean margins completed. Radiation therapy declined. Anastrozole recommended for estrogen reduction and recurrence prevention. Discussed side effects and bone density monitoring. Tamoxifen considered as an alternative due to bone density benefits, but risks include blood clots and uterine cancer, the latter not a concern post-hysterectomy. Both medications equally effective; she is open to starting anastrozole. - Prescribe anastrozole 1 mg daily for five years. - Monitor bone density every two years. - Perform diagnostic mammograms for two to five years. - Send prescription to home hospital outpatient pharmacy. - Schedule follow-up in three months to assess response to medication and bone density.  Osteopenia Osteopenia diagnosed in 2021. Discussed aromatase inhibitors' impact on bone density and monitoring necessity. Considered tamoxifen for bone density benefits. - Order bone density test as it has been four years since the last assessment.

## 2024-06-05 ENCOUNTER — Encounter: Payer: Self-pay | Admitting: *Deleted

## 2024-06-05 DIAGNOSIS — D0511 Intraductal carcinoma in situ of right breast: Secondary | ICD-10-CM

## 2024-06-09 ENCOUNTER — Encounter: Payer: Self-pay | Admitting: Internal Medicine

## 2024-06-09 ENCOUNTER — Ambulatory Visit

## 2024-06-09 ENCOUNTER — Other Ambulatory Visit: Payer: Self-pay | Admitting: Internal Medicine

## 2024-06-09 VITALS — BP 131/71 | HR 89 | Ht 65.4 in | Wt 145.4 lb

## 2024-06-09 DIAGNOSIS — Z7985 Long-term (current) use of injectable non-insulin antidiabetic drugs: Secondary | ICD-10-CM

## 2024-06-09 DIAGNOSIS — E559 Vitamin D deficiency, unspecified: Secondary | ICD-10-CM | POA: Diagnosis not present

## 2024-06-09 DIAGNOSIS — E210 Primary hyperparathyroidism: Secondary | ICD-10-CM | POA: Diagnosis not present

## 2024-06-09 DIAGNOSIS — Z7984 Long term (current) use of oral hypoglycemic drugs: Secondary | ICD-10-CM

## 2024-06-09 DIAGNOSIS — D0511 Intraductal carcinoma in situ of right breast: Secondary | ICD-10-CM

## 2024-06-09 DIAGNOSIS — E119 Type 2 diabetes mellitus without complications: Secondary | ICD-10-CM | POA: Diagnosis not present

## 2024-06-09 DIAGNOSIS — I10 Essential (primary) hypertension: Secondary | ICD-10-CM

## 2024-06-09 DIAGNOSIS — E11319 Type 2 diabetes mellitus with unspecified diabetic retinopathy without macular edema: Secondary | ICD-10-CM

## 2024-06-09 DIAGNOSIS — Z794 Long term (current) use of insulin: Secondary | ICD-10-CM

## 2024-06-09 LAB — GLUCOSE, CAPILLARY: Glucose-Capillary: 87 mg/dL (ref 70–99)

## 2024-06-09 LAB — POCT GLYCOSYLATED HEMOGLOBIN (HGB A1C): Hemoglobin A1C: 7 % — AB (ref 4.0–5.6)

## 2024-06-09 NOTE — Assessment & Plan Note (Signed)
-   Due for Vit. D 25-OH at next visit

## 2024-06-09 NOTE — Assessment & Plan Note (Signed)
 Continue plan per Oncology. Right breast lumpectomy incision healing well with no evidence of warmth, drainage or erythema. She will continue applying topical OTC scar cream as suggested by oncology team. She acknowledges the difficult road she has been on recently with regards to the new diagnosis and surgery, but she is hopeful and determined for the future.  - Continue Anastrozole  1 mg daily

## 2024-06-09 NOTE — Assessment & Plan Note (Addendum)
 Patient reports adherence to current regimen of Metformin  1000 mg twice daily and Trulicity  4.5 mg weekly. Today 6/30 A1C 7.0 (5.7 three months prior) and glucose 87. Suspect increase in A1C and recent elevated morning sugars are likely due to insulin  removal from treatment regimen while also increasing intake of soda and starches. We discussed joyful movement through dancing which she agreed she would like to do since it is too hot right now for her daily walks. Discussed that given her age, A1C of 7.0 is a perfectly reasonable goal. Though she would like to reduce it and she plans to reduce her soda and starch intake over the next 3 months.  - Continue Metformin  1000 mg BID - Continue Trulicity  4.5 mg weekly - Repeat A1C at next visit - Discuss ophthalmology visit for retinopathy and refer if necessary

## 2024-06-09 NOTE — Progress Notes (Signed)
 Established Patient Office Visit  Subjective   Patient ID: Michele Andrews, female    DOB: 1951-02-08  Age: 73 y.o. MRN: [id number]  Chief Complaint  Patient presents with   Diabetes   Ms. Criado is a pleasant 73 year old female with a history of DM Type II, HTN, and recently diagnosed breast cancer, who presents to clinic for 61-month diabetes recheck.   At her last visit in March 2025, patient's A1C was 5.7. She was instructed to discontinue insulin , and continue with Metformin  1000 mg BID and Trulicity  4.5 mg weekly. She reports adherence to this regimen. Recent glucose readings elevated in the morning recently, with highest of 148. She denies any episodes of hypoglycemia. Activity level has been reduced as she was previously walking about 30 minutes daily, but it has been too hot to do so. Given her recent diagnosis and increased stress, she reports she has been drinking 1 soda daily and increased bread intake, while reducing her previous oatmeal intake.   She is followed by Dr. Loretha in [facility] for ductal carcinoma in situ (DCIS), ERPR positive, s/p right breast lumpectomy on 05/07/24. At most recent Oncology visit she declined radiation therapy. Anastrozole  was initiated for estrogen reduction, which she will take 1mg  daily for five years. So far she reports no issues with following this regimen. Incision site from lumpectomy healing well and she reports applying OTC scar cream. She states that she is able to do all of her daily activities normally and without issue.   She reports complete resolution of neck pain headaches as discussed in prior visit 02/2024.    ROS:  She denies headaches, dizziness, fever, chills, runny nose, sore throat, vision changes, hearing changes, chest pain, shortness of breath, difficulty breathing, nausea, vomiting, abdominal pain. Denies issues with urination and defecation. No recent falls. Reports increased stress surrounding breast cancer diagnosis  and serving as her husband's primary caregiver as he battles cancer as well.      Objective:     BP 131/71 (BP Location: Right Arm, Patient Position: Sitting, Cuff Size: Small)   Pulse 89   Ht 5' 5.4 (1.661 m)   Wt 145 lb 6.4 oz (66 kg)   SpO2 100%   BMI 23.90 kg/m  BP Readings from Last 3 Encounters:  06/09/24 131/71  05/27/24 134/72  05/07/24 129/80       Constitutional: well-appearing female sitting in exam chair, in no acute distress. Ambulates without use of assistance device  HEENT: normocephalic atraumatic, mucous membranes moist Eyes: conjunctiva non-erythematous Neck: supple Cardiovascular: regular rate and rhythm, bilateral radial pulses 2+, bilateral dorsal pedal pulses 1+, brisk capillary refill bilateral feet and hands  Pulmonary/Chest: normal work of breathing on room air, lungs clear to auscultation bilaterally Abdominal: soft, non-tender, non-distended MSK: normal bulk and tone. Neurological: alert & oriented x 3, sensation intact bilateral feet to monofilament Skin: warm and dry, no ulcers or lesions on bilateral feet; healing incision right breast LOQ with no drainage, warmth or erythema  Psych: mood calm, behavior normal, thought content normal, judgement normal    Results for orders placed or performed in visit on 06/09/24  Glucose, capillary  Result Value Ref Range   Glucose-Capillary 87 70 - 99 mg/dL  POC Hbg J8R  Result Value Ref Range   Hemoglobin A1C 7.0 (A) 4.0 - 5.6 %   HbA1c POC (<> result, manual entry)     HbA1c, POC (prediabetic range)     HbA1c, POC (controlled  diabetic range)      Last hemoglobin A1c Lab Results  Component Value Date   HGBA1C 7.0 (A) 06/09/2024      The ASCVD Risk score (Arnett DK, et al., 2019) failed to calculate for the following reasons:   The valid total cholesterol range is 130 to 320 mg/dL    Assessment & Plan:   Problem List Items Addressed This Visit       Cardiovascular and Mediastinum    Essential hypertension     Endocrine   Type 2 diabetes mellitus treated with insulin  (HCC) - Primary   Relevant Orders   POC Hbg A1C (Completed)   Patient discussed and seen with Dr. Trudy.   Type 2 diabetes mellitus with retinopathy, without long-term current use of insulin , macular edema presence unspecified, unspecified laterality, unspecified retinopathy severity (HCC) Assessment & Plan: Patient reports adherence to current regimen of Metformin  1000 mg twice daily and Trulicity  4.5 mg weekly. Today 6/30 A1C 7.0 (5.7 three months prior) and glucose 87. Suspect increase in A1C and recent elevated morning sugars are likely due to insulin  removal from treatment regimen while also increasing intake of soda and starches. We discussed joyful movement through dancing which she agreed she would like to do since it is too hot right now for her daily walks. Discussed that given her age, A1C of 7.0 is a perfectly reasonable goal. Though she would like to reduce it and she plans to reduce her soda and starch intake over the next 3 months.  - POCT glycosylated hemoglobin (Hb A1C) today - Continue Metformin  1000 mg BID - Continue Trulicity  4.5 mg weekly - Repeat A1C at next visit - Discuss ophthalmology referral for retinopathy if necessary at next visit  Essential hypertension Assessment & Plan: Well-controlled BP with current regimen. No changes today.  - Continue Amlodipine  10 mg daily - Continue Metoprolol  25 mg daily - Continue Lisinopril  20 mg daily   Ductal carcinoma in situ (DCIS) of right breast Assessment & Plan: Continue plan per Oncology. Right breast lumpectomy incision healing well with no evidence of warmth, drainage or erythema. She will continue applying topical OTC scar cream as suggested by oncology team. She acknowledges the difficult road she has been on recently with regards to the new diagnosis and surgery, but she is hopeful and determined for the future.  - Continue  Anastrozole  1 mg daily   Primary hyperparathyroidism (HCC) Assessment & Plan: S/p parathyroidectomy. Annual calcium , including May 2025 have been normal. Continue to monitor. - Ca and iCa recheck at annual visit    Vitamin D  insufficiency Assessment & Plan: - Due for Vit. D 25-OH at next visit    Other orders -     Glucose, capillary   Patient denies need for medication refills today.    Return in about 3 months (around 09/09/2024) for Annual visit including diabetes, hypertension, hyperparathyroidism, VitD def.    Doyal Miyamoto, MD PGY-1 Internal Medicine

## 2024-06-09 NOTE — Patient Instructions (Signed)
 Michele Andrews, it was great to see you today! Your diabetes and blood pressure are well controlled, and we do not need to make any changes today. As we discussed, I am confident in your goals toward incorporating more joyful movement and reducing soda intake. I would like to see you back in 3 months for your routine visit. Please call our office if you have any questions or concerns in the meantime. Take care and stay well.  - Dr. Biran Mayberry

## 2024-06-09 NOTE — Assessment & Plan Note (Signed)
 Well-controlled BP with current regimen. No changes today.  - Continue Amlodipine  10 mg daily - Continue Metoprolol  25 mg daily - Continue Lisinopril  20 mg daily

## 2024-06-09 NOTE — Assessment & Plan Note (Signed)
 S/p parathyroidectomy. Annual calcium , including May 2025 have been normal. Continue to monitor. - Ca and iCa recheck at annual visit

## 2024-06-16 NOTE — Progress Notes (Signed)
Internal Medicine Clinic Attending  I was physically present during the key portions of the resident provided service and participated in the medical decision making of patient's management care. I reviewed pertinent patient test results.  The assessment, diagnosis, and plan were formulated together and I agree with the documentation in the resident's note.  Williams, Julie Anne, MD  

## 2024-06-30 LAB — SURGICAL PATHOLOGY

## 2024-07-01 ENCOUNTER — Other Ambulatory Visit: Payer: Self-pay

## 2024-07-02 ENCOUNTER — Other Ambulatory Visit: Payer: Self-pay

## 2024-07-07 ENCOUNTER — Telehealth: Payer: Self-pay

## 2024-07-07 ENCOUNTER — Other Ambulatory Visit (HOSPITAL_BASED_OUTPATIENT_CLINIC_OR_DEPARTMENT_OTHER): Payer: Self-pay

## 2024-07-07 NOTE — Telephone Encounter (Unsigned)
 Copied from CRM [id number]. Topic: Clinical - Medication Refill >> Jul 07, 2024  8:36 AM Michele Andrews wrote: Medication: ibuprofen  (ADVIL ) 800 MG tablet  Has the patient contacted their pharmacy? Yes Advised to contact her provider to have the refill sent to the pharmacy   This is the patient's preferred pharmacy:  [facility] [address], [address] [place] [address] Phone: [phone] Fax: [phone]   Is this the correct pharmacy for this prescription? Yes If no, delete pharmacy and type the correct one.   Has the prescription been filled recently? No  Is the patient out of the medication? Yes  Has the patient been seen for an appointment in the last year OR does the patient have an upcoming appointment? Yes  Can we respond through MyChart? Yes  Agent: Please be advised that Rx refills may take up to 3 business days. We ask that you follow-up with your pharmacy.

## 2024-07-08 ENCOUNTER — Other Ambulatory Visit (HOSPITAL_COMMUNITY): Payer: Self-pay

## 2024-07-09 ENCOUNTER — Other Ambulatory Visit (HOSPITAL_COMMUNITY): Payer: Self-pay

## 2024-07-09 ENCOUNTER — Other Ambulatory Visit: Payer: Self-pay | Admitting: Internal Medicine

## 2024-07-09 DIAGNOSIS — M25512 Pain in left shoulder: Secondary | ICD-10-CM

## 2024-07-09 NOTE — Telephone Encounter (Signed)
 Declined refill of ibuprofen  for Michele Andrews given elevated risk including GERD, age. I think she takes this for osteoarthritis. She should talk about alternatives for pain control at follow-up.  Ozell Kung MD 07/09/2024, 4:49 PM

## 2024-07-10 ENCOUNTER — Other Ambulatory Visit (HOSPITAL_COMMUNITY): Payer: Self-pay

## 2024-07-15 ENCOUNTER — Ambulatory Visit: Payer: Self-pay | Admitting: *Deleted

## 2024-07-15 NOTE — Telephone Encounter (Signed)
 FYI Only or Action Required?: FYI only for provider.  Patient was last seen in primary care on 06/09/2024 by Leontine Lapine, MD.  Called Nurse Triage reporting Hyperglycemia.  Symptoms began several weeks ago.  Interventions attempted: Prescription medications: Metformin .  Symptoms are: gradually worsening.  Triage Disposition: Call PCP Now  Patient/caregiver understands and will follow disposition?: Appointment scheduled- offered appointment today- but patient is unable to come today- has been scheduled for tomorrow.    Reason for Disposition  [1] Caller has URGENT medication or insulin  device (e.g., pump, continuous monitoring) question AND [2] triager unable to answer question  Answer Assessment - Initial Assessment Questions 1. BLOOD GLUCOSE: What is your blood glucose level?      247, Patient states her glucose level has been increasing sice she stopped taking her insulin .  2. ONSET: When did you check the blood glucose?     10:30 3. USUAL RANGE: What is your glucose level usually? (e.g., usual fasting morning value, usual evening value)     Running high- 174- fasting, patient was recently taken off insulin -uausal range before stopping insulin  130/125 4. KETONES: Do you check for ketones (urine or blood test strips)? If Yes, ask: What does the test show now?      na 5. TYPE 1 or 2:  Do you know what type of diabetes you have?  (e.g., Type 1, Type 2, Gestational; doesn't know)      Type 2 6. INSULIN : Do you take insulin ? What type of insulin (s) do you use? What is the mode of delivery? (syringe, pen; injection or pump)?      No injectable currently 7. DIABETES PILLS: Do you take any pills for your diabetes? If Yes, ask: Have you missed taking any pills recently?     metformin  8. OTHER SYMPTOMS: Do you have any symptoms? (e.g., fever, frequent urination, difficulty breathing, dizziness, weakness, vomiting)     No- patient reports no symptoms  Protocols  used: Diabetes - High Blood Sugar-A-AH   Copied from CRM #[id number]. Topic: Clinical - Red Word Triage >> Jul 15, 2024 10:41 AM Diannia H wrote: [facility] that prompted transfer to Nurse Triage: Patient blood sugar is 240. She was recently taken off of her insulin  and notice her sugar keeps going up. She also has a insulin  that she takes and hasn't been able to get it due to her funds. At night she is having night sweats.

## 2024-07-16 ENCOUNTER — Other Ambulatory Visit (HOSPITAL_COMMUNITY): Payer: Self-pay

## 2024-07-16 ENCOUNTER — Other Ambulatory Visit: Payer: Self-pay

## 2024-07-16 ENCOUNTER — Ambulatory Visit: Payer: Self-pay | Admitting: Student

## 2024-07-16 VITALS — BP 138/81 | HR 90 | Temp 98.1°F | Ht 60.5 in | Wt 145.4 lb

## 2024-07-16 DIAGNOSIS — Z7985 Long-term (current) use of injectable non-insulin antidiabetic drugs: Secondary | ICD-10-CM | POA: Diagnosis not present

## 2024-07-16 DIAGNOSIS — Z7984 Long term (current) use of oral hypoglycemic drugs: Secondary | ICD-10-CM

## 2024-07-16 DIAGNOSIS — E11319 Type 2 diabetes mellitus with unspecified diabetic retinopathy without macular edema: Secondary | ICD-10-CM

## 2024-07-16 DIAGNOSIS — I10 Essential (primary) hypertension: Secondary | ICD-10-CM | POA: Diagnosis not present

## 2024-07-16 MED ORDER — TRULICITY 0.75 MG/0.5ML ~~LOC~~ SOAJ
0.7500 mg | SUBCUTANEOUS | 0 refills | Status: DC
  Filled 2024-07-16: qty 2, 28d supply, fill #0

## 2024-07-16 MED ORDER — TRULICITY 1.5 MG/0.5ML ~~LOC~~ SOAJ
1.5000 mg | SUBCUTANEOUS | 0 refills | Status: DC
  Filled 2024-07-16 – 2024-09-22 (×2): qty 2, 28d supply, fill #0

## 2024-07-16 NOTE — Assessment & Plan Note (Signed)
 Stable.  BMP 2 months ago was normal. - BMP check at the next OV - Continue Amlodipine  10 mg daily - Continue Metoprolol  25 mg daily - Continue Lisinopril  20 mg daily

## 2024-07-16 NOTE — Assessment & Plan Note (Signed)
 A1c a month ago was 7.0.  Elevated blood glucose secondary to being off Trulicity  for about 2 months due to affordability.  She says she is in a better position to afford this medicine.  No infectious symptoms to explain hyperglycemia.  New medicines.  She is on metformin  1000 mg twice daily. - Start Trulicity  0.75 mg weekly injection, will titrate to 1.5 mg after 4 weeks if without side effect. - A1c check in 2 months. - Follow-up in 2 months.

## 2024-07-16 NOTE — Progress Notes (Signed)
 CC: Elevated blood sugars  HPI:  [name] is a 73 y.o. female living with a history stated below and presents today for evaluation of elevated blood sugars.   Past 2 months, she has been without Trulicity  due to affordability.  For the past 1 to 2 weeks, fasting blood glucose have been in the high 200s.  Denies infectious symptoms, has not recently been started on any new medicine.  Patient was last seen in resident [facility] about 6 weeks ago.  A1c a month ago was 7.0.   Please see problem based assessment and plan for additional details.  Past Medical History:  Diagnosis Date   Breast cancer (HCC) 04/09/2024   Carpal tunnel syndrome 03/30/2010   Diabetes mellitus    Type II- diagnosed 30 yrs. ago   Dry eye 11/09/2015   Family history of adverse reaction to anesthesia    20 YRS. AGO, HER 19 YR. OLD NEPHEW, ENDED UP ON VENTILATOR- PT. THINKS ITS BECAUSE HE ASPIRATED    Hypercalcemia    Hyperlipidemia    Hypertension    Neck pain, acute 09/02/2018   Osteoarthritis of left knee 02/23/2016   Osteopenia 09/20/1999   Rotator cuff syndrome of right shoulder    Type 2 diabetes mellitus treated with insulin  (HCC) 11/22/1994   2019: Switched from glipizide  to SGLT-2. Did not like this medication and requested to be switched back to glipizide          Vitamin D  insufficiency 04/12/2015    Current Outpatient Medications on File Prior to Visit  Medication Sig Dispense Refill   acetaminophen  (TYLENOL ) 650 MG CR tablet Take 650 mg by mouth every 8 (eight) hours as needed for pain.     amLODipine  (NORVASC ) 10 MG tablet Take 1 tablet (10 mg total) by mouth daily. 30 tablet 11   anastrozole  (ARIMIDEX ) 1 MG tablet Take 1 tablet (1 mg total) by mouth daily. 90 tablet 1   atorvastatin  (LIPITOR) 40 MG tablet Take 1 tablet (40 mg total) by mouth daily. 90 tablet 3   Blood Glucose Monitoring Suppl (FREESTYLE FREEDOM LITE) w/Device KIT 1 each by Does not apply route 3 (three) times daily  before meals. 1 kit 0   Blood Glucose Monitoring Suppl (TRUETRACK BLOOD GLUCOSE) W/DEVICE KIT Use to check blood sugar 2 times a day      Cholecalciferol (VITAMIN D3) 2000 units TABS Take 2,000 Units by mouth daily.     Continuous Glucose Sensor (FREESTYLE LIBRE 3 SENSOR) MISC Place 1 sensor on the skin every 14 days. Use to check glucose continuously 2 each 11   cyclobenzaprine  (FLEXERIL ) 5 MG tablet Take 1 tablet (5 mg total) by mouth at bedtime as needed for muscle spasms. 5 tablet 0   diclofenac  Sodium (VOLTAREN ) 1 % GEL Apply 2 grams topically 4 (four) times daily. 300 g 0   diclofenac  Sodium (VOLTAREN ) 1 % GEL Apply 2 g topically 4 (four) times daily. 300 g 0   hydroxypropyl methylcellulose / hypromellose (ISOPTO TEARS / GONIOVISC) 2.5 % ophthalmic solution Place 1-2 drops into both eyes 3 (three) times daily as needed for dry eyes.     ibuprofen  (ADVIL ) 800 MG tablet Take 1 tablet (800 mg total) by mouth every 8 (eight) hours as needed for pain. 30 tablet 1   lisinopril  (ZESTRIL ) 20 MG tablet Take 1 tablet (20 mg total) by mouth daily. 90 tablet 3   metFORMIN  (GLUCOPHAGE ) 1000 MG tablet Take 1 tablet (1,000 mg total) by mouth 2 (  two) times daily with a meal. 180 tablet 3   metoprolol  succinate (TOPROL -XL) 25 MG 24 hr tablet Take 1 tablet (25 mg total) by mouth daily before breakfast. 90 tablet 3   [DISCONTINUED] insulin  glargine (SEMGLEE ) 100 UNIT/ML injection Inject 0.18 mLs (18 Units total) into the skin at bedtime. 10 mL 11   [DISCONTINUED] sitaGLIPtin  (JANUVIA ) 100 MG tablet TAKE 1 TABLET (100 MG TOTAL) BY MOUTH DAILY. 90 tablet 1   No current facility-administered medications on file prior to visit.    Review of Systems: ROS negative except for what is noted on the assessment and plan.  Vitals:   07/16/24 1417 07/16/24 1426  BP: 139/81 138/81  Pulse: 89 90  Temp: 98.1 F (36.7 C)   TempSrc: Oral   SpO2: 98%   Weight: 145 lb 6.4 oz (66 kg)   Height: 5' 0.5 (1.537 m)      Physical Exam: Constitutional: Not in acute distress. Cardiovascular: RRR, no murmurs. Pulmonary/Chest: Clear bilateral lungs Abdominal: soft, non-tender, non-distended.  Assessment & Plan:   Patient discussed with Dr. Shawn  Assessment & Plan Type 2 diabetes mellitus with retinopathy, without long-term current use of insulin , macular edema presence unspecified, unspecified laterality, unspecified retinopathy severity (HCC) A1c a month ago was 7.0.  Elevated blood glucose secondary to being off Trulicity  for about 2 months due to affordability.  She says she is in a better position to afford this medicine.  No infectious symptoms to explain hyperglycemia.  New medicines.  She is on metformin  1000 mg twice daily. - Start Trulicity  0.75 mg weekly injection, will titrate to 1.5 mg after 4 weeks if without side effect. - A1c check in 2 months. - Follow-up in 2 months. Essential hypertension Stable.  BMP 2 months ago was normal. - BMP check at the next OV - Continue Amlodipine  10 mg daily - Continue Metoprolol  25 mg daily - Continue Lisinopril  20 mg daily  No orders of the defined types were placed in this encounter.   [name], MD [facility], PGY-2  Date 07/16/2024 Time 2:55 PM

## 2024-07-16 NOTE — Patient Instructions (Addendum)
 It was a pleasure taking care of you today!    1.Your blood sugars have been elevated due to being off Trulicity . We will restart you on the low-dose Trulicity  0.75 mg injection weekly. If tolerated without side effects, we will increase to 1.5 mg after 4 weeks.  2. Please remember that fruits like peaches contain natural sugars--continue to follow a healthy diet and maintain regular exercise.  3. Continue taking metformin  1000 mg twice daily.  I have ordered the following labs for you:  Lab Orders  No laboratory test(s) ordered today      Follow up: 2 months   Should you have any questions or concerns please call the internal medicine clinic at [phone].     [name], MD  [facility]

## 2024-07-17 ENCOUNTER — Other Ambulatory Visit (HOSPITAL_COMMUNITY): Payer: Self-pay

## 2024-07-17 NOTE — Progress Notes (Signed)
 [facility] Attending  Case discussed with the resident at the time of the visit.  We reviewed the resident's history and exam and pertinent patient test results.  I agree with the assessment, diagnosis, and plan of care documented in the resident's note.

## 2024-07-24 ENCOUNTER — Other Ambulatory Visit (HOSPITAL_COMMUNITY): Payer: Self-pay

## 2024-07-29 ENCOUNTER — Other Ambulatory Visit: Payer: Self-pay | Admitting: Internal Medicine

## 2024-07-29 ENCOUNTER — Telehealth: Payer: Self-pay | Admitting: *Deleted

## 2024-07-29 DIAGNOSIS — M25512 Pain in left shoulder: Secondary | ICD-10-CM

## 2024-07-29 NOTE — Telephone Encounter (Signed)
 Will forward to [facility], CMA to schedule patient with an appointment. Copied from CRM #[id number]. Topic: Clinical - Medical Advice >> Jul 29, 2024  9:04 AM Alfonso ORN wrote: Reason for CRM: muscle spasm in neck rate pain 8 , right hand have a trigger finger middle is locking up ,pt. [phone] Refuse nurse triage

## 2024-07-29 NOTE — Telephone Encounter (Signed)
 I called pt back to sch her an appt, but no answer. Unable to leave message.

## 2024-07-29 NOTE — Telephone Encounter (Unsigned)
 Copied from CRM #[id number]. Topic: Clinical - Medication Refill >> Jul 29, 2024  9:02 AM Alfonso ORN wrote: Medication: ibuprofen  (ADVIL ) 800 MG tablet  Has the patient contacted their pharmacy? No (Agent: If no, request that the patient contact the pharmacy for the refill. If patient does not wish to contact the pharmacy document the reason why and proceed with request.) (Agent: If yes, when and what did the pharmacy advise?)  This is the patient's preferred pharmacy:  [facility] [address] [place] [address] Phone: [phone] Fax: [phone]   Is this the correct pharmacy for this prescription? Yes If no, delete pharmacy and type the correct one.   Has the prescription been filled recently? No  Is the patient out of the medication? Yes  Has the patient been seen for an appointment in the last year OR does the patient have an upcoming appointment? Yes  Can we respond through MyChart? No  Agent: Please be advised that Rx refills may take up to 3 business days. We ask that you follow-up with your pharmacy.

## 2024-07-30 NOTE — Telephone Encounter (Signed)
 Called and spoke with the pt.  Name: Michele Andrews, Daidone MRN: [id number]  Date: 07/31/2024 Status: Sch  Time: 2:15 PM Length: 30  Visit Type: OPEN ESTABLISHED [726] Copay: $10.00  Provider: Norrine Sharper, MD     Copied from CRM [id number]. Topic: Referral - Question >> Jul 29, 2024  9:06 AM Alfonso ORN wrote: Reason for CRM: patient requesting a referral for sports medicine doctor  Patient could not stay on phone to answer question  to send the correct crm due to at work   Pt [phone] (  ----------------------------------------------------------------------- From previous Reason for Contact - Referral Question: Reason for CRM:

## 2024-07-30 NOTE — Telephone Encounter (Signed)
 LMOM for the pt to call back.  Copied from CRM [id number]. Topic: Referral - Question >> Jul 29, 2024  9:06 AM Alfonso ORN wrote: Reason for CRM: patient requesting a referral for sports medicine doctor  Patient could not stay on phone to answer question  to send the correct crm due to at work   Pt [phone] (  ----------------------------------------------------------------------- From previous Reason for Contact - Referral Question: Reason for CRM:

## 2024-07-31 ENCOUNTER — Ambulatory Visit: Admitting: Student

## 2024-07-31 ENCOUNTER — Other Ambulatory Visit (HOSPITAL_COMMUNITY): Payer: Self-pay

## 2024-07-31 VITALS — BP 123/80 | HR 87 | Temp 98.1°F | Ht 60.5 in | Wt 148.6 lb

## 2024-07-31 DIAGNOSIS — M65331 Trigger finger, right middle finger: Secondary | ICD-10-CM

## 2024-07-31 NOTE — Assessment & Plan Note (Signed)
 Chronic, recurrent. Counseled regarding use of brace. Requesting appointment with sports medicine for repeat injection.

## 2024-07-31 NOTE — Patient Instructions (Signed)
 Remember to bring all of the medications that you take (including over the counter medications and supplements) with you to every clinic visit.  This after visit summary is an important review of tests, referrals, and medication changes that were discussed during your visit. If you have questions or concerns, call [phone]. Outside of clinic business hours, call the main hospital at [phone] and ask the operator for the on-call internal medicine resident.   Ozell Kung MD 07/31/2024, 2:55 PM

## 2024-07-31 NOTE — Progress Notes (Signed)
  Patient name: Michele Andrews Date of birth: 17-Apr-1951 Date of visit: 07/31/24  Subjective   Reason for visit: Referral (Sports medicine for trigger finger)  Recurrence of trigger finger of middle finger of left hand. Worse at night, often wakes up with it aching and locked. Had injection with sports medicine that several months ago that served her quite well. Needs referral for sports medicine follow-up and injection.  Current Outpatient Medications  Medication Instructions   acetaminophen  (TYLENOL ) 650 mg, Every 8 hours PRN   amLODipine  (NORVASC ) 10 mg, Oral, Daily   anastrozole  (ARIMIDEX ) 1 mg, Oral, Daily   atorvastatin  (LIPITOR) 40 mg, Oral, Daily   Blood Glucose Monitoring Suppl (FREESTYLE FREEDOM LITE) w/Device KIT 1 each, Does not apply, 3 times daily before meals   Blood Glucose Monitoring Suppl (TRUETRACK BLOOD GLUCOSE) W/DEVICE KIT Use to check blood sugar 2 times a day    Continuous Glucose Sensor (FREESTYLE LIBRE 3 SENSOR) MISC Place 1 sensor on the skin every 14 days. Use to check glucose continuously   diclofenac  Sodium (VOLTAREN ) 1 % GEL Apply 2 grams topically 4 (four) times daily.   diclofenac  Sodium (VOLTAREN ) 2 g, Topical, 4 times daily   hydroxypropyl methylcellulose / hypromellose (ISOPTO TEARS / GONIOVISC) 2.5 % ophthalmic solution 1-2 drops, 3 times daily PRN   lisinopril  (ZESTRIL ) 20 mg, Oral, Daily   metFORMIN  (GLUCOPHAGE ) 1,000 mg, Oral, 2 times daily with meals   metoprolol  succinate (TOPROL -XL) 25 mg, Oral, Daily before breakfast   Trulicity  0.75 mg, Subcutaneous, Weekly   [START ON 08/14/2024] Trulicity  1.5 mg, Subcutaneous, Weekly   Vitamin D3 2,000 Units, Daily     Objective  Today's Vitals   07/31/24 1426 07/31/24 1435  BP: 139/80 123/80  Pulse: 89 87  Temp: 98.1 F (36.7 C)   TempSrc: Oral   SpO2: 97%   Weight: 148 lb 9.6 oz (67.4 kg)   Height: 5' 0.5 (1.537 m)   Body mass index is 28.54 kg/m.   Physical Exam Constitutional:       Appearance: Normal appearance.  Cardiovascular:     Rate and Rhythm: Normal rate and regular rhythm.  Pulmonary:     Effort: Pulmonary effort is normal. No respiratory distress.  Musculoskeletal:     Comments: Middle finger of right hand locked in flexion, but released with massage of the mcp joint Nodularity of the flexor tendon  Skin:    General: Skin is warm and dry.  Neurological:     Mental Status: She is alert.     Cranial Nerves: No facial asymmetry.  Psychiatric:        Mood and Affect: Affect normal.        Speech: Speech normal.        Behavior: Behavior normal.      Assessment & Plan   Trigger middle finger of right hand Assessment & Plan: Chronic, recurrent. Counseled regarding use of brace. Requesting appointment with sports medicine for repeat injection.  Orders: -     Ambulatory referral to Sports Medicine    Return in about 2 months (around 09/30/2024) for diabetes and hypertension.  Ozell Kung MD 07/31/2024, 9:18 PM

## 2024-08-04 NOTE — Progress Notes (Signed)
 [facility] Attending  Case discussed with the resident at the time of the visit.  We reviewed the resident's history and exam and pertinent patient test results.  I agree with the assessment, diagnosis, and plan of care documented in the resident's note.

## 2024-08-06 ENCOUNTER — Ambulatory Visit (INDEPENDENT_AMBULATORY_CARE_PROVIDER_SITE_OTHER): Admitting: Internal Medicine

## 2024-08-06 ENCOUNTER — Encounter: Payer: Self-pay | Admitting: Internal Medicine

## 2024-08-06 VITALS — BP 129/63 | Ht 64.0 in | Wt 145.0 lb

## 2024-08-06 DIAGNOSIS — E119 Type 2 diabetes mellitus without complications: Secondary | ICD-10-CM | POA: Diagnosis not present

## 2024-08-06 DIAGNOSIS — M65331 Trigger finger, right middle finger: Secondary | ICD-10-CM

## 2024-08-06 DIAGNOSIS — Z794 Long term (current) use of insulin: Secondary | ICD-10-CM | POA: Diagnosis not present

## 2024-08-06 MED ORDER — METHYLPREDNISOLONE ACETATE 40 MG/ML IJ SUSP
20.0000 mg | Freq: Once | INTRAMUSCULAR | Status: AC
  Administered 2024-08-06: 20 mg via INTRA_ARTICULAR

## 2024-08-06 NOTE — Progress Notes (Cosign Needed)
 PCP: Leontine Lapine, MD  Subjective:   HPI: Patient is a 73 y.o. female here for right middle finger trigger finger.  Right-hand-dominant female who has worked at [employer] for many years she endorses pain at the base of her middle finger for the last month.  She received a steroid injection in March for trigger finger which provided significant pain relief up until about a month ago.   she endorses pain with extension and flexion of the middle finger, currently having more discomfort with extension.  She has to manually extend the finger.  No interval trauma or additional injury.  It often wakes her from sleep at night.  Previously documented history of T2DM.  Last A1c 7.0 in late June.  Past Medical History:  Diagnosis Date   Breast cancer (HCC) 04/09/2024   Carpal tunnel syndrome 03/30/2010   Diabetes mellitus    Type II- diagnosed 30 yrs. ago   Dry eye 11/09/2015   Family history of adverse reaction to anesthesia    20 YRS. AGO, HER 19 YR. OLD NEPHEW, ENDED UP ON VENTILATOR- PT. THINKS ITS BECAUSE HE ASPIRATED    Hypercalcemia    Hyperlipidemia    Hypertension    Neck pain, acute 09/02/2018   Osteoarthritis of left knee 02/23/2016   Osteopenia 09/20/1999   Rotator cuff syndrome of right shoulder    Type 2 diabetes mellitus treated with insulin  (HCC) 11/22/1994   2019: Switched from glipizide  to SGLT-2. Did not like this medication and requested to be switched back to glipizide          Vitamin D  insufficiency 04/12/2015    Current Outpatient Medications on File Prior to Visit  Medication Sig Dispense Refill   acetaminophen  (TYLENOL ) 650 MG CR tablet Take 650 mg by mouth every 8 (eight) hours as needed for pain.     amLODipine  (NORVASC ) 10 MG tablet Take 1 tablet (10 mg total) by mouth daily. 30 tablet 11   anastrozole  (ARIMIDEX ) 1 MG tablet Take 1 tablet (1 mg total) by mouth daily. 90 tablet 1   atorvastatin  (LIPITOR) 40 MG tablet Take 1 tablet (40 mg total) by mouth daily. 90  tablet 3   Blood Glucose Monitoring Suppl (FREESTYLE FREEDOM LITE) w/Device KIT 1 each by Does not apply route 3 (three) times daily before meals. 1 kit 0   Blood Glucose Monitoring Suppl (TRUETRACK BLOOD GLUCOSE) W/DEVICE KIT Use to check blood sugar 2 times a day      Cholecalciferol (VITAMIN D3) 2000 units TABS Take 2,000 Units by mouth daily.     Continuous Glucose Sensor (FREESTYLE LIBRE 3 SENSOR) MISC Place 1 sensor on the skin every 14 days. Use to check glucose continuously 2 each 11   diclofenac  Sodium (VOLTAREN ) 1 % GEL Apply 2 grams topically 4 (four) times daily. 300 g 0   diclofenac  Sodium (VOLTAREN ) 1 % GEL Apply 2 g topically 4 (four) times daily. 300 g 0   Dulaglutide  (TRULICITY ) 0.75 MG/0.5ML SOAJ Inject 0.75 mg into the skin once a week. 2 mL 0   [START ON 08/14/2024] Dulaglutide  (TRULICITY ) 1.5 MG/0.5ML SOAJ Inject 1.5 mg into the skin once a week. 2 mL 0   hydroxypropyl methylcellulose / hypromellose (ISOPTO TEARS / GONIOVISC) 2.5 % ophthalmic solution Place 1-2 drops into both eyes 3 (three) times daily as needed for dry eyes.     lisinopril  (ZESTRIL ) 20 MG tablet Take 1 tablet (20 mg total) by mouth daily. 90 tablet 3   metFORMIN  (GLUCOPHAGE ) 1000  MG tablet Take 1 tablet (1,000 mg total) by mouth 2 (two) times daily with a meal. 180 tablet 3   metoprolol  succinate (TOPROL -XL) 25 MG 24 hr tablet Take 1 tablet (25 mg total) by mouth daily before breakfast. 90 tablet 3   [DISCONTINUED] insulin  glargine (SEMGLEE ) 100 UNIT/ML injection Inject 0.18 mLs (18 Units total) into the skin at bedtime. 10 mL 11   [DISCONTINUED] sitaGLIPtin  (JANUVIA ) 100 MG tablet TAKE 1 TABLET (100 MG TOTAL) BY MOUTH DAILY. 90 tablet 1   No current facility-administered medications on file prior to visit.    Past Surgical History:  Procedure Laterality Date   ABDOMINAL HYSTERECTOMY     BREAST BIOPSY Right 04/09/2024   MM RT BREAST BX W LOC DEV 1ST LESION IMAGE BX SPEC STEREO GUIDE 04/09/2024 [facility]  MAMMOGRAPHY   BREAST BIOPSY Right 04/09/2024   MM RT BREAST BX W LOC DEV EA AD LESION IMG BX SPEC STEREO GUIDE 04/09/2024 [facility] MAMMOGRAPHY   BREAST BIOPSY  05/06/2024   MM RT RADIOACTIVE SEED EA ADD LESION LOC MAMMO GUIDE 05/06/2024 [facility] MAMMOGRAPHY   BREAST BIOPSY  05/06/2024   MM RT RADIOACTIVE SEED LOC MAMMO GUIDE 05/06/2024 [facility] MAMMOGRAPHY   BREAST CYST EXCISION Left    BREAST LUMPECTOMY WITH RADIOACTIVE SEED LOCALIZATION Right 05/07/2024   Procedure: BREAST LUMPECTOMY WITH RADIOACTIVE SEED LOCALIZATION;  Surgeon: Curvin Deward MOULD, MD;  Location: [facility];  Service: General;  Laterality: Right;  RIGHT BREAST RADIOACTIVE SEED LOCALIZED LUMPECTOMY x2   BREAST SURGERY Left 1963   cyst removed    PARATHYROIDECTOMY Right 11/08/2015   PARATHYROIDECTOMY N/A 11/08/2015   Procedure: RIGHT INFERIOR PARATHYROIDECTOMY;  Surgeon: Krystal Spinner, MD;  Location[facility];  Service: General;  Laterality: N/A;    No Known Allergies  BP 129/63   Ht 5' 4 (1.626 m)   Wt 145 lb (65.8 kg)   BMI 24.89 kg/m       No data to display              No data to display             Objective:  Physical Exam:  Gen: NAD, comfortable in exam room  Right middle finger No obvious deformity on inspection Mild TTP along the palmar aspect of the MCP joint Unable to fully flex at the MCP joint.  Gets stuck with extension and has to manually extend. Right hand is grossly NV intact   Assessment & Plan:  1.  Right middle finger trigger finger Known history of right middle finger trigger finger.  Here today requesting repeat steroid injection.  Last injection completed in March and provided significant pain relief until last month.  Repeat injection completed today without complication. She is aware that surgery is generally indicated after 2 injections and she will likely need to see a hand surgeon if discomfort returns. She will follow-up on an as-needed basis.  PROCEDURE:  Risks & benefits  of right middle trigger finger injection reviewed.  Consent obtained.  Time-out completed.  Patient prepped and draped in the normal fashion.  Area cleansed with alcohol.  Ethyl chloride spray used to anesthetize the skin.  Solution of 0.5 mL 1% lidocaine  with 0.5 mL methylprednisolone  (Depo-Medrol ) 40mg /ml injected into right middle finger A1 pulley.  Patient tolerated procedure well without any complications.  Area covered with adhesive bandage.  Post-procedure care reviewed.  All questions answered.

## 2024-08-07 ENCOUNTER — Other Ambulatory Visit (HOSPITAL_COMMUNITY): Payer: Self-pay

## 2024-08-08 ENCOUNTER — Other Ambulatory Visit (HOSPITAL_COMMUNITY): Payer: Self-pay

## 2024-08-18 ENCOUNTER — Other Ambulatory Visit: Payer: Self-pay | Admitting: Student

## 2024-08-19 ENCOUNTER — Encounter (HOSPITAL_COMMUNITY): Payer: Self-pay

## 2024-08-19 ENCOUNTER — Other Ambulatory Visit (HOSPITAL_COMMUNITY): Payer: Self-pay

## 2024-08-19 ENCOUNTER — Other Ambulatory Visit: Payer: Self-pay

## 2024-08-19 DIAGNOSIS — Z794 Long term (current) use of insulin: Secondary | ICD-10-CM

## 2024-08-19 MED ORDER — METFORMIN HCL 1000 MG PO TABS
1000.0000 mg | ORAL_TABLET | Freq: Two times a day (BID) | ORAL | 3 refills | Status: DC
  Filled 2024-08-19: qty 180, 90d supply, fill #0

## 2024-08-19 NOTE — Telephone Encounter (Signed)
 Medication sent to pharmacy

## 2024-08-25 ENCOUNTER — Ambulatory Visit: Payer: Self-pay

## 2024-08-25 ENCOUNTER — Other Ambulatory Visit (HOSPITAL_COMMUNITY): Payer: Self-pay

## 2024-08-25 ENCOUNTER — Ambulatory Visit (INDEPENDENT_AMBULATORY_CARE_PROVIDER_SITE_OTHER): Admitting: Student

## 2024-08-25 ENCOUNTER — Other Ambulatory Visit: Payer: Self-pay

## 2024-08-25 ENCOUNTER — Encounter: Payer: Self-pay | Admitting: Student

## 2024-08-25 ENCOUNTER — Ambulatory Visit (HOSPITAL_COMMUNITY)
Admission: RE | Admit: 2024-08-25 | Discharge: 2024-08-25 | Disposition: A | Discharge status: Home / Self Care | Source: Ambulatory Visit | Attending: Internal Medicine | Admitting: Internal Medicine

## 2024-08-25 VITALS — BP 136/71 | HR 94 | Temp 98.0°F | Ht 64.0 in | Wt 148.2 lb

## 2024-08-25 DIAGNOSIS — M79641 Pain in right hand: Secondary | ICD-10-CM | POA: Diagnosis not present

## 2024-08-25 DIAGNOSIS — W51XXXA Accidental striking against or bumped into by another person, initial encounter: Secondary | ICD-10-CM

## 2024-08-25 DIAGNOSIS — Y93F9 Activity, other caregiving: Secondary | ICD-10-CM | POA: Diagnosis not present

## 2024-08-25 DIAGNOSIS — M129 Arthropathy, unspecified: Secondary | ICD-10-CM | POA: Diagnosis not present

## 2024-08-25 DIAGNOSIS — M25841 Other specified joint disorders, right hand: Secondary | ICD-10-CM | POA: Diagnosis not present

## 2024-08-25 MED ORDER — CELECOXIB 200 MG PO CAPS
200.0000 mg | ORAL_CAPSULE | Freq: Every day | ORAL | 0 refills | Status: DC
  Filled 2024-08-25: qty 14, 14d supply, fill #0

## 2024-08-25 NOTE — Telephone Encounter (Signed)
Pt is here today for an appt

## 2024-08-25 NOTE — Progress Notes (Unsigned)
 Established Patient Office Visit  Subjective   Patient ID: Michele Andrews, female    DOB: 1951-08-18  Age: 73 y.o. MRN: [id number]  Chief Complaint  Patient presents with   Acute Visit    PER E2C2 : ( RIGHT )  reporting Hand Pain.   Symptoms began several days ago.   Interventions attempted: Rest, hydration, or home remedies and Ice/heat application.   Symptoms are: gradually worsening.       Michele Andrews is a 73 y.o. who presents to the clinic for right hand pain that started on Saturday after her mother fell on her hand. Please see problem based assessment and plan for additional details.    Patient Active Problem List   Diagnosis Date Noted   Right hand pain 08/26/2024   Ductal carcinoma in situ (DCIS) of right breast 04/17/2024   Trigger finger 02/21/2024   Nuclear sclerotic cataract of both eyes 06/15/2020   Diabetes mellitus type 2 with retinopathy (HCC) 06/15/2020   Alopecia 10/16/2019   Nasal polyp 10/02/2018   Osteoarthritis of left knee 02/23/2016   Vitamin D  insufficiency 03/10/2015   Osteopenia 08/03/2014   GERD (gastroesophageal reflux disease) 03/04/2014   Preventative health care 01/06/2012   Primary hyperparathyroidism (HCC) 11/02/2008   Hyperlipemia 11/22/2006   Essential hypertension 11/22/2006      Objective:     BP 136/71 (BP Location: Left Arm, Patient Position: Sitting, Cuff Size: Large)   Pulse 94   Temp 98 F (36.7 C) (Oral)   Ht 5' 4 (1.626 m)   Wt 148 lb 3.2 oz (67.2 kg)   SpO2 100%   BMI 25.44 kg/m  BP Readings from Last 3 Encounters:  08/25/24 136/71  08/06/24 129/63  07/31/24 123/80   Wt Readings from Last 3 Encounters:  08/25/24 148 lb 3.2 oz (67.2 kg)  08/06/24 145 lb (65.8 kg)  07/31/24 148 lb 9.6 oz (67.4 kg)      Physical Exam Vitals reviewed.  Constitutional:      Appearance: She is not ill-appearing, toxic-appearing or diaphoretic.  Cardiovascular:     Rate and Rhythm: Normal rate and regular rhythm.      Heart sounds: No murmur heard. Pulmonary:     Effort: Pulmonary effort is normal. No respiratory distress.     Breath sounds: No wheezing.  Musculoskeletal:     Right wrist: Bony tenderness present. No snuff box tenderness. Decreased range of motion.     Right hand: Normal range of motion.     Comments: Diffuse pain in the carpal bones with tenderness to palpation. Wrist flexion extension range of motion is limited by pain. Radial pulse intact. Capillary refill present, no change in sensation, hand is warm. Normal range of motion of all 5 phalanges on the right hand.  Skin:    General: Skin is warm and dry.  Neurological:     Mental Status: She is alert.  Psychiatric:        Mood and Affect: Mood and affect normal.      No results found for any visits on 08/25/24.  Last metabolic panel Lab Results  Component Value Date   GLUCOSE 86 05/01/2024   NA 138 05/01/2024   K 4.2 05/01/2024   CL 103 05/01/2024   CO2 26 05/01/2024   BUN 6 (L) 05/01/2024   CREATININE 0.53 05/01/2024   GFRNONAA >60 05/01/2024   CALCIUM  10.1 05/01/2024   PHOS 3.2 08/13/2015   PROT 8.6 (H) 05/27/2020   ALBUMIN 4.6  10/15/2019   LABGLOB 4.7 (H) 05/27/2020   AGRATIO 0.8 05/27/2020   BILITOT 0.4 10/15/2019   ALKPHOS 60 10/15/2019   AST 22 10/15/2019   ALT 20 10/15/2019   ANIONGAP 9 05/01/2024      The ASCVD Risk score (Arnett DK, et al., 2019) failed to calculate for the following reasons:   The valid total cholesterol range is 130 to 320 mg/dL    Assessment & Plan:   Problem List Items Addressed This Visit       Other   Right hand pain - Primary   Patient presents with a 2-day history of right hand pain after she was assisting her elderly mother who fell on top of her hand trying toileting transfer.  Patient reports that her hand has been very tender and painful since then.  She has tried ice, heat, Voltaren  gel: None of these are helping.  She stated that the pain is constant  throughout the day and is impacting her job duties such as typing due to difficulty moving fingers from the pain. On exam, her right wrist range of motion is limited by pain.  She is able to move all of her fingers.  There is no edema or erythema of the right hand.  Radial pulse intact.  No changes in sensation along the hand.  There is diffuse pain throughout the carpals, no specific point tenderness during palpation.  With history of trauma, I am worried the patient may have a fracture carpal. Plan: - Celebrex  200 mg oral daily for 2 weeks - Right hand Xray       Relevant Orders   DG Hand Complete Right (Completed)    Return if symptoms worsen or fail to improve.   ------------------------------------ Addendum: Right hand x-ray does not show acute fracture.  There is presence of mild multifocal osteoarthritis and soft tissue calcifications about the second digit noted carpal phalangeal joint and proximal carpal row which can be seen with CPPD arthropathy. PLAN: - Continue with original plan of Celebrex  200 mg oral daily for 2 weeks  Damien Lease, DO

## 2024-08-25 NOTE — Telephone Encounter (Signed)
 FYI Only or Action Required?: Action required by provider: request for appointment.  Patient was last seen in primary care on 07/31/2024 by Norrine Sharper, MD.  Called Nurse Triage reporting Hand Pain.  Symptoms began several days ago.  Interventions attempted: Rest, hydration, or home remedies and Ice/heat application.  Symptoms are: gradually worsening.  Triage Disposition: See HCP Within 4 Hours (Or PCP Triage)  Patient/caregiver understands and will follow disposition?: YesCopied from CRM #[id number]. Topic: Clinical - Red Word Triage >> Aug 25, 2024 12:49 PM Zane F wrote: Red Word that prompted transfer to Nurse Triage:   Concern: Intense throbbing pain in right hand   When did the symptoms start?: Saturday  What have you done to aid in the concern ? Have you taken anything to assist with the matter?: Yes   If so, what did you take?: diclofenac  Sodium (VOLTAREN ) 1 % GEL, icing and putting heat interchangeably but receiving no relief. Reason for Disposition  [1] SEVERE pain (e.g., excruciating, unable to use hand at all) AND [2] not improved after 2 hours of pain medicine  Answer Assessment - Initial Assessment Questions Pt stated pain is 10. Can't pick up anything with it. Pt took muscle relaxer last night with no relief. No known injury. Pt is at work and was helping pts during triage.      1. ONSET: When did the pain start?     Saturday  2. LOCATION: Where is the pain located?     Right hand/wrist 3. PAIN: How bad is the pain? (Scale 1-10; or mild, moderate, severe)     10 4. WORK OR EXERCISE: Has there been any recent work or exercise that involved this part (i.e., hand or wrist) of the body?     Worked around [employer] 5. CAUSE: What do you think is causing the pain?     Over worked  6. AGGRAVATING FACTORS: What makes the pain worse? (e.g., using computer)     Any movement  7. OTHER SYMPTOMS: Do you have any other symptoms? (e.g., fever,  neck pain, numbness or tingling, rash, swelling)     swelling  Protocols used: Hand Pain-A-AH

## 2024-08-25 NOTE — Telephone Encounter (Addendum)
 First attempt; no answer Second attempt; no answer Third attempt; no answer   Patient/patient representative is calling to schedule an appointment. She is stating that her hand is getting worse. She has already been seen for it. She is icing it and that's not helping either. Patients callback number is [phone]. She was at work at the moment and couldn't talk a whole lot and give many details.

## 2024-08-25 NOTE — Patient Instructions (Signed)
 Thank you, [name] for allowing us  to provide your care today. Today we discussed right hand pain.    I have ordered Xrays, please have these done at [facility].  I sent Celebrex  to the pharmacy for you, take one tablet daily for 14 days. Please take with food and be sure to drink plenty of water.  If the pain does not get better over the next couple days, please call us !   I have ordered the following medication/changed the following medications:   Stop the following medications: There are no discontinued medications.   Start the following medications: Meds ordered this encounter  Medications   celecoxib  (CELEBREX ) 200 MG capsule    Sig: Take 1 capsule (200 mg total) by mouth daily.    Dispense:  14 capsule    Refill:  0     Follow up: As needed for hand pain    Should you have any questions or concerns please call the internal medicine clinic at [phone].     Please note that our late policy has changed.  If you are more than 15 minutes late to your appointment, you may be asked to reschedule your appointment.  Dr. Kandis, D.O. [facility]

## 2024-08-26 ENCOUNTER — Ambulatory Visit: Payer: Self-pay | Admitting: Student

## 2024-08-26 DIAGNOSIS — M79641 Pain in right hand: Secondary | ICD-10-CM | POA: Insufficient documentation

## 2024-08-26 NOTE — Progress Notes (Signed)
 [facility] Attending  Case discussed with the resident at the time of the visit.  We reviewed the resident's history and exam and pertinent patient test results.  I agree with the assessment, diagnosis, and plan of care documented in the resident's note.

## 2024-08-26 NOTE — Assessment & Plan Note (Addendum)
 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  Patient reports that her hand has been very tender and painful since then.  She has tried ice, heat, Voltaren  gel: None of these are helping.  She stated that the pain is constant throughout the day and is impacting her job duties such as typing due to difficulty moving fingers from the pain. On exam, her right wrist range of motion is limited by pain.  She is able to move all of her fingers.  There is no edema or erythema of the right hand.  Radial pulse intact.  No changes in sensation along the hand.  There is diffuse pain throughout the carpals, no specific point tenderness during palpation.  With history of trauma, I am worried the patient may have a fracture carpal. Plan: - Celebrex  200 mg oral daily for 2 weeks - Right hand Xray

## 2024-09-03 ENCOUNTER — Other Ambulatory Visit (HOSPITAL_COMMUNITY): Payer: Self-pay

## 2024-09-03 ENCOUNTER — Telehealth: Payer: Self-pay | Admitting: *Deleted

## 2024-09-03 NOTE — Telephone Encounter (Signed)
 RTC to patient stated that the Celebrex  made her heart feel funny.  Stopped taking and the problem went away.  Still having pain in her hand at a level 4-5.  Was a 10 before.  Wants to know if there is something else she can take.   Copied from CRM [id number]. Topic: Clinical - Prescription Issue >> Sep 03, 2024  9:15 AM Michele Andrews wrote: Reason for CRM: celecoxib  (CELEBREX ) 200 MG capsule, patient states she took it for 6 days caused really bad heart palpitations she states she is still having some pain but not as bad

## 2024-09-04 ENCOUNTER — Telehealth: Payer: Self-pay | Admitting: *Deleted

## 2024-09-04 ENCOUNTER — Inpatient Hospital Stay: Admitting: Adult Health

## 2024-09-04 NOTE — Telephone Encounter (Signed)
 Call to patient.  Message left that the pharmacy had returned her call.

## 2024-09-04 NOTE — Telephone Encounter (Signed)
 Copied from CRM [id number]. Topic: Clinical - Medication Question >> Sep 04, 2024 11:31 AM Diannia H wrote: Reason for CRM: Patient called and was trying to get the status of her medicine for her hand. She stated she spoke with May on yesterday about it.

## 2024-09-08 ENCOUNTER — Encounter: Payer: Self-pay | Admitting: *Deleted

## 2024-09-15 ENCOUNTER — Telehealth: Payer: Self-pay | Admitting: *Deleted

## 2024-09-15 NOTE — Telephone Encounter (Signed)
 Copied from CRM #[id number]. Topic: General - Other >> Sep 15, 2024 12:07 PM Miquel SAILOR wrote: Reason for CRM: Patient asking for PCP full name NGUYEN, DIANA

## 2024-09-17 ENCOUNTER — Other Ambulatory Visit (HOSPITAL_COMMUNITY): Payer: Self-pay

## 2024-09-17 ENCOUNTER — Other Ambulatory Visit: Payer: Self-pay

## 2024-09-17 DIAGNOSIS — E782 Mixed hyperlipidemia: Secondary | ICD-10-CM

## 2024-09-17 MED ORDER — ATORVASTATIN CALCIUM 40 MG PO TABS
40.0000 mg | ORAL_TABLET | Freq: Every day | ORAL | 3 refills | Status: AC
  Filled 2024-09-17: qty 90, 90d supply, fill #0
  Filled 2024-12-15: qty 90, 90d supply, fill #1

## 2024-09-17 NOTE — Telephone Encounter (Signed)
 Medication sent to pharmacy

## 2024-09-22 ENCOUNTER — Other Ambulatory Visit (HOSPITAL_COMMUNITY): Payer: Self-pay

## 2024-09-30 ENCOUNTER — Ambulatory Visit (INDEPENDENT_AMBULATORY_CARE_PROVIDER_SITE_OTHER): Admitting: Student

## 2024-09-30 VITALS — BP 121/76 | HR 93 | Temp 98.1°F | Ht 64.0 in | Wt 139.8 lb

## 2024-09-30 DIAGNOSIS — Z7985 Long-term (current) use of injectable non-insulin antidiabetic drugs: Secondary | ICD-10-CM

## 2024-09-30 DIAGNOSIS — Z7984 Long term (current) use of oral hypoglycemic drugs: Secondary | ICD-10-CM | POA: Diagnosis not present

## 2024-09-30 DIAGNOSIS — I10 Essential (primary) hypertension: Secondary | ICD-10-CM

## 2024-09-30 DIAGNOSIS — E11319 Type 2 diabetes mellitus with unspecified diabetic retinopathy without macular edema: Secondary | ICD-10-CM

## 2024-09-30 DIAGNOSIS — Z794 Long term (current) use of insulin: Secondary | ICD-10-CM

## 2024-09-30 DIAGNOSIS — E119 Type 2 diabetes mellitus without complications: Secondary | ICD-10-CM

## 2024-09-30 LAB — POCT GLYCOSYLATED HEMOGLOBIN (HGB A1C): HbA1c, POC (controlled diabetic range): 6.5 % (ref 0.0–7.0)

## 2024-09-30 LAB — GLUCOSE, CAPILLARY: Glucose-Capillary: 86 mg/dL (ref 70–99)

## 2024-09-30 NOTE — Patient Instructions (Addendum)
 Thank you so much for coming to the clinic today!   We are going to check your cholesterol today  Keep doing what you're doing! Your numbers are looking great!   If you have any questions please feel free to the call the clinic at anytime at [phone]. It was a pleasure seeing you!  Best, Dr. Mele Sylvester

## 2024-09-30 NOTE — Progress Notes (Unsigned)
 CC: Physical  HPI:  Michele Andrews is a 73 y.o. female living with a history stated below and presents today for a physical . Please see problem based assessment and plan for additional details.  She is here for a preventative visit.   Past Medical History:  Diagnosis Date   Breast cancer (HCC) 04/09/2024   Carpal tunnel syndrome 03/30/2010   Diabetes mellitus    Type II- diagnosed 30 yrs. ago   Dry eye 11/09/2015   Family history of adverse reaction to anesthesia    20 YRS. AGO, HER 19 YR. OLD NEPHEW, ENDED UP ON VENTILATOR- PT. THINKS ITS BECAUSE HE ASPIRATED    Hypercalcemia    Hyperlipidemia    Hypertension    Neck pain, acute 09/02/2018   Osteoarthritis of left knee 02/23/2016   Osteopenia 09/20/1999   Rotator cuff syndrome of right shoulder    Type 2 diabetes mellitus treated with insulin  (HCC) 11/22/1994   2019: Switched from glipizide  to SGLT-2. Did not like this medication and requested to be switched back to glipizide          Vitamin D  insufficiency 04/12/2015    Current Outpatient Medications on File Prior to Visit  Medication Sig Dispense Refill   acetaminophen  (TYLENOL ) 650 MG CR tablet Take 650 mg by mouth every 8 (eight) hours as needed for pain.     amLODipine  (NORVASC ) 10 MG tablet Take 1 tablet (10 mg total) by mouth daily. 30 tablet 11   anastrozole  (ARIMIDEX ) 1 MG tablet Take 1 tablet (1 mg total) by mouth daily. 90 tablet 1   atorvastatin  (LIPITOR) 40 MG tablet Take 1 tablet (40 mg total) by mouth daily. 90 tablet 3   Blood Glucose Monitoring Suppl (FREESTYLE FREEDOM LITE) w/Device KIT 1 each by Does not apply route 3 (three) times daily before meals. 1 kit 0   Blood Glucose Monitoring Suppl (TRUETRACK BLOOD GLUCOSE) W/DEVICE KIT Use to check blood sugar 2 times a day      Cholecalciferol (VITAMIN D3) 2000 units TABS Take 2,000 Units by mouth daily.     Continuous Glucose Sensor (FREESTYLE LIBRE 3 SENSOR) MISC Place 1 sensor on the skin every 14  days. Use to check glucose continuously 2 each 11   diclofenac  Sodium (VOLTAREN ) 1 % GEL Apply 2 grams topically 4 (four) times daily. 300 g 0   diclofenac  Sodium (VOLTAREN ) 1 % GEL Apply 2 g topically 4 (four) times daily. 300 g 0   Dulaglutide  (TRULICITY ) 0.75 MG/0.5ML SOAJ Inject 0.75 mg into the skin once a week. 2 mL 0   Dulaglutide  (TRULICITY ) 1.5 MG/0.5ML SOAJ Inject 1.5 mg into the skin once a week. 2 mL 0   hydroxypropyl methylcellulose / hypromellose (ISOPTO TEARS / GONIOVISC) 2.5 % ophthalmic solution Place 1-2 drops into both eyes 3 (three) times daily as needed for dry eyes.     lisinopril  (ZESTRIL ) 20 MG tablet Take 1 tablet (20 mg total) by mouth daily. 90 tablet 3   metFORMIN  (GLUCOPHAGE ) 1000 MG tablet Take 1 tablet (1,000 mg total) by mouth 2 (two) times daily with a meal. 180 tablet 3   metoprolol  succinate (TOPROL -XL) 25 MG 24 hr tablet Take 1 tablet (25 mg total) by mouth daily before breakfast. 90 tablet 3   [DISCONTINUED] insulin  glargine (SEMGLEE ) 100 UNIT/ML injection Inject 0.18 mLs (18 Units total) into the skin at bedtime. 10 mL 11   [DISCONTINUED] sitaGLIPtin  (JANUVIA ) 100 MG tablet TAKE 1 TABLET (100 MG TOTAL) BY MOUTH  DAILY. 90 tablet 1   No current facility-administered medications on file prior to visit.    Family History  Problem Relation Age of Onset   Hypertension Mother    Cancer Mother    Breast cancer Mother 79   Breast cancer Sister 35   Breast cancer Maternal Aunt 67   Breast cancer Cousin 38    Social History   Socioeconomic History   Marital status: Married    Spouse name: Not on file   Number of children: Not on file   Years of education: 12   Highest education level: Not on file  Occupational History   Occupation: Curator: [employer]  Tobacco Use   Smoking status: Never   Smokeless tobacco: Never  Substance and Sexual Activity   Alcohol use: No   Drug use: No   Sexual activity: Not on file  Other Topics Concern    Not on file  Social History Narrative   Not on file   Social Drivers of Health   Financial Resource Strain: Low Risk  (07/16/2024)   Overall Financial Resource Strain (CARDIA)    Difficulty of Paying Living Expenses: Not very hard  Food Insecurity: No Food Insecurity (04/22/2024)   Hunger Vital Sign    Worried About Running Out of Food in the Last Year: Never true    Ran Out of Food in the Last Year: Never true  Transportation Needs: No Transportation Needs (04/22/2024)   PRAPARE - Administrator, Civil Service (Medical): No    Lack of Transportation (Non-Medical): No  Physical Activity: Sufficiently Active (07/16/2024)   Exercise Vital Sign    Days of Exercise per Week: 5 days    Minutes of Exercise per Session: 30 min  Stress: No Stress Concern Present (07/16/2024)   [employer] of Occupational Health - Occupational Stress Questionnaire    Feeling of Stress: Only a little  Social Connections: Moderately Integrated (07/16/2024)   Social Connection and Isolation Panel    Frequency of Communication with Friends and Family: More than three times a week    Frequency of Social Gatherings with Friends and Family: Three times a week    Attends Religious Services: More than 4 times per year    Active Member of Clubs or Organizations: No    Attends Banker Meetings: Never    Marital Status: Married  Catering manager Violence: Not At Risk (04/22/2024)   Humiliation, Afraid, Rape, and Kick questionnaire    Fear of Current or Ex-Partner: No    Emotionally Abused: No    Physically Abused: No    Sexually Abused: No    Review of Systems: ROS negative except for what is noted on the assessment and plan.  Vitals:   09/30/24 1424  BP: 121/76  Pulse: 93  Temp: 98.1 F (36.7 C)  TempSrc: Oral  SpO2: 98%  Weight: 139 lb 12.8 oz (63.4 kg)  Height: 5' 4 (1.626 m)    Physical Exam: Constitutional: well-appearing female  in no acute distress HENT: normocephalic  atraumatic, mucous membranes moist Eyes: conjunctiva non-erythematous Neck: supple Cardiovascular: regular rate and rhythm, no m/r/g Pulmonary/Chest: normal work of breathing on room air, lungs clear to auscultation bilaterally Abdominal: soft, non-tender, non-distended MSK: normal bulk and tone Neurological: alert & oriented x 3, 5/5 strength in bilateral upper and lower extremities, normal gait Skin: warm and dry Psych: normal mood and affect  Assessment & Plan:   Diabetes mellitus type  2 with retinopathy (HCC) A1c improving from 7.0-6.5.  Her current regimen is metformin  at 1000 mg twice a day, Trulicity  1.5 mg weekly.  Her weight has significantly improved from 147 and 139.  No changes at this time as she is not well-controlled.  At next visit, can consider uptitrating her Trulicity  if patient is tolerating it okay.  Checking lipid panel today.  Essential hypertension Blood pressure within normal limits at 121/76.  BMP 4 months ago within normal limits as well.  Continuing her amlodipine  10 mg, metoprolol  25 mg, lisinopril  20 mg.   Patient discussed with Dr. Machen    Niemah Schwebke, M.D. [facility], PGY-3 Pager: [phone] Date 10/01/2024 Time 3:11 PM

## 2024-10-01 ENCOUNTER — Other Ambulatory Visit (HOSPITAL_COMMUNITY): Payer: Self-pay

## 2024-10-01 LAB — LIPID PANEL
Chol/HDL Ratio: 2.2 ratio (ref 0.0–4.4)
Cholesterol, Total: 103 mg/dL (ref 100–199)
HDL: 47 mg/dL (ref >39)
LDL Chol Calc (NIH): 42 mg/dL (ref 0–99)
Triglycerides: 67 mg/dL (ref 0–149)
VLDL Cholesterol Cal: 14 mg/dL (ref 5–40)

## 2024-10-01 MED ORDER — COVID-19 MRNA VAC-TRIS(PFIZER) 30 MCG/0.3ML IM SUSY
0.3000 mL | PREFILLED_SYRINGE | Freq: Once | INTRAMUSCULAR | 0 refills | Status: AC
  Filled 2024-10-01: qty 0.3, 1d supply, fill #0

## 2024-10-01 NOTE — Assessment & Plan Note (Addendum)
 A1c improving from 7.0-6.5.  Her current regimen is metformin  at 1000 mg twice a day, Trulicity  1.5 mg weekly.  Her weight has significantly improved from 147 and 139.  No changes at this time as she is not well-controlled.  At next visit, can consider uptitrating her Trulicity  if patient is tolerating it okay.  Checking lipid panel today.

## 2024-10-01 NOTE — Assessment & Plan Note (Signed)
 Blood pressure within normal limits at 121/76.  BMP 4 months ago within normal limits as well.  Continuing her amlodipine  10 mg, metoprolol  25 mg, lisinopril  20 mg.

## 2024-10-02 ENCOUNTER — Ambulatory Visit: Payer: Self-pay | Admitting: Student

## 2024-10-06 NOTE — Progress Notes (Signed)
 [facility] Attending  Case discussed with the resident at the time of the visit.  We reviewed the resident's history and exam and pertinent patient test results.  I agree with the assessment, diagnosis, and plan of care documented in the resident's note.

## 2024-10-14 DIAGNOSIS — H26491 Other secondary cataract, right eye: Secondary | ICD-10-CM | POA: Diagnosis not present

## 2024-10-14 DIAGNOSIS — Z961 Presence of intraocular lens: Secondary | ICD-10-CM | POA: Diagnosis not present

## 2024-10-23 ENCOUNTER — Other Ambulatory Visit: Payer: Self-pay | Admitting: Student

## 2024-10-23 ENCOUNTER — Other Ambulatory Visit: Payer: Self-pay

## 2024-10-23 ENCOUNTER — Other Ambulatory Visit (HOSPITAL_COMMUNITY): Payer: Self-pay

## 2024-10-23 DIAGNOSIS — I10 Essential (primary) hypertension: Secondary | ICD-10-CM

## 2024-10-23 MED ORDER — TRULICITY 1.5 MG/0.5ML ~~LOC~~ SOAJ
1.5000 mg | SUBCUTANEOUS | 0 refills | Status: AC
  Filled 2024-10-23: qty 2, 28d supply, fill #0

## 2024-10-23 MED ORDER — LISINOPRIL 20 MG PO TABS
20.0000 mg | ORAL_TABLET | Freq: Every day | ORAL | 3 refills | Status: AC
  Filled 2024-10-23: qty 90, 90d supply, fill #0

## 2024-10-24 ENCOUNTER — Other Ambulatory Visit (HOSPITAL_COMMUNITY): Payer: Self-pay

## 2024-10-28 ENCOUNTER — Telehealth: Payer: Self-pay

## 2024-10-28 NOTE — Telephone Encounter (Signed)
 Left message for patient about upcoming appointment on 11/19

## 2024-10-29 ENCOUNTER — Inpatient Hospital Stay: Attending: Hematology and Oncology | Admitting: Hematology and Oncology

## 2024-10-29 ENCOUNTER — Other Ambulatory Visit (HOSPITAL_COMMUNITY): Payer: Self-pay

## 2024-10-29 VITALS — BP 128/62 | HR 89 | Temp 97.2°F | Resp 16 | Wt 139.7 lb

## 2024-10-29 DIAGNOSIS — F418 Other specified anxiety disorders: Secondary | ICD-10-CM | POA: Insufficient documentation

## 2024-10-29 DIAGNOSIS — Z1721 Progesterone receptor positive status: Secondary | ICD-10-CM | POA: Insufficient documentation

## 2024-10-29 DIAGNOSIS — Z803 Family history of malignant neoplasm of breast: Secondary | ICD-10-CM | POA: Insufficient documentation

## 2024-10-29 DIAGNOSIS — D0511 Intraductal carcinoma in situ of right breast: Secondary | ICD-10-CM | POA: Diagnosis not present

## 2024-10-29 DIAGNOSIS — Z79899 Other long term (current) drug therapy: Secondary | ICD-10-CM | POA: Diagnosis not present

## 2024-10-29 DIAGNOSIS — Z79811 Long term (current) use of aromatase inhibitors: Secondary | ICD-10-CM | POA: Diagnosis not present

## 2024-10-29 DIAGNOSIS — Z170 Estrogen receptor positive status [ER+]: Secondary | ICD-10-CM | POA: Diagnosis not present

## 2024-10-29 MED ORDER — SERTRALINE HCL 25 MG PO TABS
25.0000 mg | ORAL_TABLET | Freq: Every day | ORAL | 1 refills | Status: AC
  Filled 2024-10-29: qty 90, 90d supply, fill #0
  Filled 2024-12-09 – 2024-12-15 (×2): qty 90, 90d supply, fill #1

## 2024-10-29 NOTE — Assessment & Plan Note (Signed)
 Assessment and Plan Assessment & Plan Depression and anxiety Decreased appetite and weight loss likely due to depression and anxiety. Continues breast cancer medication without issues. Initiated Zoloft 25 mg daily for one week, then increase to 50 mg daily. - Provided three-month supply of Zoloft. - Advised to contact if medication ineffective after one to two months for adjustment. - Offered support resources, including food bank assistance.  Intraductal carcinoma in situ of right breast Continues breast cancer medication without issues. Bone density test ordered but not scheduled. - Continue anastrozole . She is tolerating it very well. - She wants to do her bone density at [facility] She understands to contact us  if she doesn't have this scheduled Mammogram once a yr.

## 2024-10-29 NOTE — Progress Notes (Signed)
 [facility] CONSULT NOTE  Patient Care Team: Leontine Lapine, MD as PCP - General Kenn Dunnings, OD as Consulting Physician (Optometry) Tyree Nanetta SAILOR, RN as Oncology Nurse Navigator Loretha Ash, MD as Consulting Physician (Hematology and Oncology) Dewey Rush, MD as Consulting Physician (Radiation Oncology)  CHIEF COMPLAINTS/PURPOSE OF CONSULTATION:  Newly diagnosed breast cancer  HISTORY OF PRESENTING ILLNESS:  Michele Andrews 73 y.o. female is here because of recent diagnosis of right breast.  I reviewed her records extensively and collaborated the history with the patient.  SUMMARY OF ONCOLOGIC HISTORY: Oncology History  Ductal carcinoma in situ (DCIS) of right breast  03/03/2024 Mammogram   In the right breast, calcifications warrant further evaluation with magnified views. In the left breast, no findings suspicious for malignancy. Two groups of indeterminate LOWER OUTER RIGHT breast calcifications, measuring 0.5 cm and 0.6 cm. Tissue sampling of both of these groups recommended.   04/17/2024 Initial Diagnosis   Ductal carcinoma in situ (DCIS) of right breast   05/07/2024 Pathology Results   BREAST, RIGHT, LUMPECTOMY:       Ductal carcinoma in situ, solid type, intermediate nuclear grade,  with calcifications  DCIS greatest dimension: 6.5 mm (DCIS is present on 3 / 10 blocks)  Margins:  Negative            Closest margin: 2 mm from posterior margin  Prognostic markers: ER positive PR positive  Biopsy site and clip (X shaped) identified  Other findings: None  See oncology table   B. BREAST, RIGHT ADDITIONAL MEDIAL MARGIN, EXCISION:       Benign breast tissue.       Negative for atypia or malignancy.   C. BREAST, RIGHT ADDITIONAL DEEP MARGIN, EXCISION:       Benign breast tissue.       Negative for atypia or malignancy.    05/2024 -  Anti-estrogen oral therapy   Anastrozole    10/29/2024 Cancer Staging   Staging form: Breast, AJCC 8th  Edition - Clinical stage from 10/29/2024: Stage Unknown (cTis (DCIS), cNX, cM0, G2, ER+, PR+) - Signed by Loretha Ash, MD on 10/29/2024 Histologic grading system: 3 grade system     Discussed the use of AI scribe software for clinical note transcription with the patient, who gave verbal consent to proceed.  History of Present Illness Michele Andrews is a 73 year old female with ductal carcinoma in situ (DCIS) who presents for follow-up after lumpectomy.    Michele Andrews is a 73 year old female who presents with weight loss and appetite changes.  She has experienced a decrease in appetite, noting that she does not eat much, and has lost weight from 145 pounds to 139 pounds over a yr. She attributes these changes to her mood, indicating that depression and anxiety have affected her appetite.  She has been dealing with depression and anxiety for a couple of years, which she associates with stress from work, home responsibilities, and financial issues. She has not been on medication for these conditions but has been involved in a support group and has received support from her sisters.  She mentions having had surgery on her right eye and needing new glasses, which has been a financial burden. She also reports loose bowel movements. No issues with urination.  She is otherwise tolerating anastrozole  well, compliant with it. She didn't notice any breast changes  Rest of the pertinent 10 point ROS reviewed and neg.   MEDICAL HISTORY:  Past Medical History:  Diagnosis Date   Breast cancer (HCC) 04/09/2024   Carpal tunnel syndrome 03/30/2010   Diabetes mellitus    Type II- diagnosed 30 yrs. ago   Dry eye 11/09/2015   Family history of adverse reaction to anesthesia    20 YRS. AGO, HER 19 YR. OLD NEPHEW, ENDED UP ON VENTILATOR- PT. THINKS ITS BECAUSE HE ASPIRATED    Hypercalcemia    Hyperlipidemia    Hypertension    Neck pain, acute 09/02/2018   Osteoarthritis of left knee  02/23/2016   Osteopenia 09/20/1999   Rotator cuff syndrome of right shoulder    Type 2 diabetes mellitus treated with insulin  (HCC) 11/22/1994   2019: Switched from glipizide  to SGLT-2. Did not like this medication and requested to be switched back to glipizide          Vitamin D  insufficiency 04/12/2015    SURGICAL HISTORY: Past Surgical History:  Procedure Laterality Date   ABDOMINAL HYSTERECTOMY     BREAST BIOPSY Right 04/09/2024   MM RT BREAST BX W LOC DEV 1ST LESION IMAGE BX SPEC STEREO GUIDE 04/09/2024 [facility]   BREAST BIOPSY Right 04/09/2024   MM RT BREAST BX W LOC DEV EA AD LESION IMG BX SPEC STEREO GUIDE 04/09/2024 [facility]   BREAST BIOPSY  05/06/2024   MM RT RADIOACTIVE SEED EA ADD LESION LOC MAMMO GUIDE 05/06/2024 [facility]   BREAST BIOPSY  05/06/2024   MM RT RADIOACTIVE SEED LOC MAMMO GUIDE 05/06/2024 [facility]   BREAST CYST EXCISION Left    BREAST LUMPECTOMY WITH RADIOACTIVE SEED LOCALIZATION Right 05/07/2024   Procedure: BREAST LUMPECTOMY WITH RADIOACTIVE SEED LOCALIZATION;  Surgeon: Curvin Deward MOULD, MD;  Location: [facility];  Service: General;  Laterality: Right;  RIGHT BREAST RADIOACTIVE SEED LOCALIZED LUMPECTOMY x2   BREAST SURGERY Left 1963   cyst removed    PARATHYROIDECTOMY Right 11/08/2015   PARATHYROIDECTOMY N/A 11/08/2015   Procedure: RIGHT INFERIOR PARATHYROIDECTOMY;  Surgeon: Krystal Spinner, MD;  Location: [facility];  Service: General;  Laterality: N/A;    SOCIAL HISTORY: Social History   Socioeconomic History   Marital status: Married    Spouse name: Not on file   Number of children: Not on file   Years of education: 12   Highest education level: Not on file  Occupational History   Occupation: nurse tech    Employer: [employer]  Tobacco Use   Smoking status: Never   Smokeless tobacco: Never  Substance and Sexual Activity   Alcohol use: No   Drug use: No   Sexual activity: Not on file  Other Topics Concern    Not on file  Social History Narrative   Not on file   Social Drivers of Health   Financial Resource Strain: Low Risk  (07/16/2024)   Overall Financial Resource Strain (CARDIA)    Difficulty of Paying Living Expenses: Not very hard  Food Insecurity: No Food Insecurity (04/22/2024)   Hunger Vital Sign    Worried About Running Out of Food in the Last Year: Never true    Ran Out of Food in the Last Year: Never true  Transportation Needs: No Transportation Needs (04/22/2024)   PRAPARE - Administrator, Civil Service (Medical): No    Lack of Transportation (Non-Medical): No  Physical Activity: Sufficiently Active (07/16/2024)   Exercise Vital Sign    Days of Exercise per Week: 5 days    Minutes of Exercise per Session: 30 min  Stress: No Stress Concern Present (  07/16/2024)   [employer] - Occupational Stress Questionnaire    Feeling of Stress: Only a little  Social Connections: Moderately Integrated (07/16/2024)   Social Connection and Isolation Panel    Frequency of Communication with Friends and Family: More than three times a week    Frequency of Social Gatherings with Friends and Family: Three times a week    Attends Religious Services: More than 4 times per year    Active Member of Clubs or Organizations: No    Attends Banker Meetings: Never    Marital Status: Married  Catering Manager Violence: Not At Risk (04/22/2024)   Humiliation, Afraid, Rape, and Kick questionnaire    Fear of Current or Ex-Partner: No    Emotionally Abused: No    Physically Abused: No    Sexually Abused: No    FAMILY HISTORY: Family History  Problem Relation Age of Onset   Hypertension Mother    Cancer Mother    Breast cancer Mother 32   Breast cancer Sister 22   Breast cancer Maternal Aunt 50   Breast cancer Cousin 27    ALLERGIES:  has no known allergies.  MEDICATIONS:  Current Outpatient Medications  Medication Sig Dispense Refill    sertraline (ZOLOFT) 25 MG tablet Take 1 tablet (25 mg total) by mouth daily. 90 tablet 1   acetaminophen  (TYLENOL ) 650 MG CR tablet Take 650 mg by mouth every 8 (eight) hours as needed for pain.     amLODipine  (NORVASC ) 10 MG tablet Take 1 tablet (10 mg total) by mouth daily. 30 tablet 11   anastrozole  (ARIMIDEX ) 1 MG tablet Take 1 tablet (1 mg total) by mouth daily. 90 tablet 1   atorvastatin  (LIPITOR) 40 MG tablet Take 1 tablet (40 mg total) by mouth daily. 90 tablet 3   Blood Glucose Monitoring Suppl (FREESTYLE FREEDOM LITE) w/Device KIT 1 each by Does not apply route 3 (three) times daily before meals. 1 kit 0   Blood Glucose Monitoring Suppl (TRUETRACK BLOOD GLUCOSE) W/DEVICE KIT Use to check blood sugar 2 times a day      Cholecalciferol (VITAMIN D3) 2000 units TABS Take 2,000 Units by mouth daily.     Continuous Glucose Sensor (FREESTYLE LIBRE 3 SENSOR) MISC Place 1 sensor on the skin every 14 days. Use to check glucose continuously 2 each 11   diclofenac  Sodium (VOLTAREN ) 1 % GEL Apply 2 grams topically 4 (four) times daily. 300 g 0   diclofenac  Sodium (VOLTAREN ) 1 % GEL Apply 2 g topically 4 (four) times daily. 300 g 0   Dulaglutide  (TRULICITY ) 0.75 MG/0.5ML SOAJ Inject 0.75 mg into the skin once a week. 2 mL 0   Dulaglutide  (TRULICITY ) 1.5 MG/0.5ML SOAJ Inject 1.5 mg into the skin once a week. 2 mL 0   hydroxypropyl methylcellulose / hypromellose (ISOPTO TEARS / GONIOVISC) 2.5 % ophthalmic solution Place 1-2 drops into both eyes 3 (three) times daily as needed for dry eyes.     lisinopril  (ZESTRIL ) 20 MG tablet Take 1 tablet (20 mg total) by mouth daily. 90 tablet 3   metFORMIN  (GLUCOPHAGE ) 1000 MG tablet Take 1 tablet (1,000 mg total) by mouth 2 (two) times daily with a meal. 180 tablet 3   metoprolol  succinate (TOPROL -XL) 25 MG 24 hr tablet Take 1 tablet (25 mg total) by mouth daily before breakfast. 90 tablet 3   No current facility-administered medications for this visit.    REVIEW  OF SYSTEMS:   Constitutional:  Denies fevers, chills or abnormal night sweats Eyes: Denies blurriness of vision, double vision or watery eyes Ears, nose, mouth, throat, and face: Denies mucositis or sore throat Respiratory: Denies cough, dyspnea or wheezes Cardiovascular: Denies palpitation, chest discomfort or lower extremity swelling Gastrointestinal:  Denies nausea, heartburn or change in bowel habits Skin: Denies abnormal skin rashes Lymphatics: Denies new lymphadenopathy or easy bruising Neurological:Denies numbness, tingling or new weaknesses Behavioral/Psych: Mood is stable, no new changes  Breast: Denies any palpable lumps or discharge All other systems were reviewed with the patient and are negative.  PHYSICAL EXAMINATION: ECOG PERFORMANCE STATUS: 0 - Asymptomatic  Vitals:   10/29/24 1436  BP: 128/62  Pulse: 89  Resp: 16  Temp: (!) 97.2 F (36.2 C)  SpO2: 100%   Filed Weights   10/29/24 1436  Weight: 139 lb 11.2 oz (63.4 kg)    GENERAL:alert, no distress and comfortable Bilateral breasts inspected and palpated. No palpable masses. No regional adenopathy  LABORATORY DATA:  I have reviewed the data as listed Lab Results  Component Value Date   WBC 8.4 10/15/2019   HGB 12.4 10/15/2019   HCT 37.7 10/15/2019   MCV 92 10/15/2019   PLT 224 10/15/2019   Lab Results  Component Value Date   NA 138 05/01/2024   K 4.2 05/01/2024   CL 103 05/01/2024   CO2 26 05/01/2024    RADIOGRAPHIC STUDIES: I have personally reviewed the radiological reports and agreed with the findings in the report.  ASSESSMENT AND PLAN:  Ductal carcinoma in situ (DCIS) of right breast Assessment and Plan Assessment & Plan Depression and anxiety Decreased appetite and weight loss likely due to depression and anxiety. Continues breast cancer medication without issues. Initiated Zoloft  25 mg daily for one week, then increase to 50 mg daily. - Provided three-month supply of Zoloft . - Advised  to contact if medication ineffective after one to two months for adjustment. - Offered support resources, including food bank assistance.  Intraductal carcinoma in situ of right breast Continues breast cancer medication without issues. Bone density test ordered but not scheduled. - Continue anastrozole . She is tolerating it very well. - She wants to do her bone density at [facility] She understands to contact us  if she doesn't have this scheduled Mammogram once a yr.    All questions were answered. The patient knows to call the clinic with any problems, questions or concerns.    Amber Stalls, MD 10/29/24

## 2024-11-03 ENCOUNTER — Other Ambulatory Visit (HOSPITAL_COMMUNITY): Payer: Self-pay

## 2024-11-13 ENCOUNTER — Ambulatory Visit: Payer: Self-pay

## 2024-11-13 ENCOUNTER — Other Ambulatory Visit (HOSPITAL_COMMUNITY): Payer: Self-pay

## 2024-11-13 ENCOUNTER — Other Ambulatory Visit: Payer: Self-pay

## 2024-11-13 VITALS — BP 124/64 | HR 92 | Temp 98.6°F | Ht 64.0 in | Wt 141.0 lb

## 2024-11-13 DIAGNOSIS — E11319 Type 2 diabetes mellitus with unspecified diabetic retinopathy without macular edema: Secondary | ICD-10-CM

## 2024-11-13 DIAGNOSIS — Z0000 Encounter for general adult medical examination without abnormal findings: Secondary | ICD-10-CM

## 2024-11-13 DIAGNOSIS — Z7985 Long-term (current) use of injectable non-insulin antidiabetic drugs: Secondary | ICD-10-CM

## 2024-11-13 DIAGNOSIS — Z853 Personal history of malignant neoplasm of breast: Secondary | ICD-10-CM

## 2024-11-13 DIAGNOSIS — K529 Noninfective gastroenteritis and colitis, unspecified: Secondary | ICD-10-CM | POA: Diagnosis not present

## 2024-11-13 DIAGNOSIS — R159 Full incontinence of feces: Secondary | ICD-10-CM | POA: Diagnosis not present

## 2024-11-13 DIAGNOSIS — Z7984 Long term (current) use of oral hypoglycemic drugs: Secondary | ICD-10-CM

## 2024-11-13 MED ORDER — METFORMIN HCL ER 500 MG PO TB24
1000.0000 mg | ORAL_TABLET | Freq: Every day | ORAL | 3 refills | Status: AC
  Filled 2024-11-13: qty 180, 90d supply, fill #0

## 2024-11-13 NOTE — Patient Instructions (Addendum)
 Thank you, Michele Andrews for allowing us  to provide your care today. Today we discussed the following:  - We discussed increasing your fiber intake and stopping the Trulicity  for the next month to see how your symptoms change  - I have sent the Metformin  Extended release which you will take 1000 mg (two tablets) twice daily with food    Follow up: 4-6 weeks    Should you have any questions or concerns please call the Internal Medicine Clinic at [phone].     Doyal Miyamoto, MD [facility]

## 2024-11-13 NOTE — Progress Notes (Unsigned)
 Patient name: Michele Andrews Date of birth: May 01, 1951 Date of visit: 11/14/24  Type of visit: Acute Office Visit   Subjective   Chief concern:  Chief Complaint  Patient presents with   Follow-up    Pt is here to discuss bowel issues. Pt states can not control bowel and has been going on for months.     Michele Andrews is a pleasant 73 y.o. female with a history of HTN, DMII with retinopathy, DCIS right breast, HLD, and GERD who presents to [facility] clinic for evaluation of chronic diarrhea.  Patient presents today to discuss issues with chronic diarrhea which have been present for roughly the past 2 years.  She notes that she will have episodes of diarrhea which are nonbloody and light brown in color.  She will typically have 1-2 episodes a day, and occasionally will have fecal incontinence with these episodes.  She cannot recall her last firm bowel movement, but notes that her bowel movements improve and firmness when she eats bread and avoids spicy foods and hot sauce.  She does not have any abdominal pain associated with her diarrhea, but notes gurgling of her stomach.  She has had FOBT routinely for colon cancer screening, though has not had any colonoscopies.  ROS: Negative unless otherwise listed in the HPI.  Patient Active Problem List   Diagnosis Date Noted   Chronic diarrhea 11/14/2024   Right hand pain 08/26/2024   Ductal carcinoma in situ (DCIS) of right breast 04/17/2024   Trigger finger 02/21/2024   Nuclear sclerotic cataract of both eyes 06/15/2020   Diabetes mellitus type 2 with retinopathy (HCC) 06/15/2020   Alopecia 10/16/2019   Nasal polyp 10/02/2018   Osteoarthritis of left knee 02/23/2016   Vitamin D  insufficiency 03/10/2015   Osteopenia 08/03/2014   GERD (gastroesophageal reflux disease) 03/04/2014   Preventative health care 01/06/2012   Primary hyperparathyroidism 11/02/2008   Hyperlipemia 11/22/2006   Essential hypertension 11/22/2006     Past Surgical  History:  Procedure Laterality Date   ABDOMINAL HYSTERECTOMY     BREAST BIOPSY Right 04/09/2024   MM RT BREAST BX W LOC DEV 1ST LESION IMAGE BX SPEC STEREO GUIDE 04/09/2024 [facility]   BREAST BIOPSY Right 04/09/2024   MM RT BREAST BX W LOC DEV EA AD LESION IMG BX SPEC STEREO GUIDE 04/09/2024 [facility]   BREAST BIOPSY  05/06/2024   MM RT RADIOACTIVE SEED EA ADD LESION LOC MAMMO GUIDE 05/06/2024 [facility]   BREAST BIOPSY  05/06/2024   MM RT RADIOACTIVE SEED LOC MAMMO GUIDE 05/06/2024 [facility]   BREAST CYST EXCISION Left    BREAST LUMPECTOMY WITH RADIOACTIVE SEED LOCALIZATION Right 05/07/2024   Procedure: BREAST LUMPECTOMY WITH RADIOACTIVE SEED LOCALIZATION;  Surgeon: Curvin Deward MOULD, MD;  Location: [facility];  Service: General;  Laterality: Right;  RIGHT BREAST RADIOACTIVE SEED LOCALIZED LUMPECTOMY x2   BREAST SURGERY Left 1963   cyst removed    PARATHYROIDECTOMY Right 11/08/2015   PARATHYROIDECTOMY N/A 11/08/2015   Procedure: RIGHT INFERIOR PARATHYROIDECTOMY;  Surgeon: Krystal Spinner, MD;  Location[facility] OR;  Service: General;  Laterality: N/A;     Current Outpatient Medications  Medication Instructions   acetaminophen  (TYLENOL ) 650 mg, Every 8 hours PRN   amLODipine  (NORVASC ) 10 mg, Oral, Daily   anastrozole  (ARIMIDEX ) 1 mg, Oral, Daily   atorvastatin  (LIPITOR) 40 mg, Oral, Daily   Blood Glucose Monitoring Suppl (FREESTYLE FREEDOM LITE) w/Device KIT 1 each, Does not apply, 3 times daily  before meals   Blood Glucose Monitoring Suppl (TRUETRACK BLOOD GLUCOSE) W/DEVICE KIT Use to check blood sugar 2 times a day    Continuous Glucose Sensor (FREESTYLE LIBRE 3 SENSOR) MISC Place 1 sensor on the skin every 14 days. Use to check glucose continuously   diclofenac  Sodium (VOLTAREN ) 2 g, Topical, 4 times daily   hydroxypropyl methylcellulose / hypromellose (ISOPTO TEARS / GONIOVISC) 2.5 % ophthalmic solution 1-2 drops, 3 times daily PRN   lisinopril   (ZESTRIL ) 20 mg, Oral, Daily   metFORMIN  (GLUCOPHAGE -XR) 1,000 mg, Oral, Daily with breakfast   metoprolol  succinate (TOPROL -XL) 25 mg, Oral, Daily before breakfast   sertraline  (ZOLOFT ) 25 mg, Oral, Daily   Trulicity  1.5 mg, Subcutaneous, Weekly   Vitamin D3 2,000 Units, Daily    Social History   Tobacco Use   Smoking status: Never   Smokeless tobacco: Never  Substance Use Topics   Alcohol use: No   Drug use: No      Objective  Today's Vitals   11/13/24 1422  BP: 124/64  Pulse: 92  Temp: 98.6 F (37 C)  Weight: 141 lb (64 kg)  Height: 5' 4 (1.626 m)  Body mass index is 24.2 kg/m.   Physical Exam:   Constitutional: well-appearing female sitting in exam chair, in no acute distress. Ambulates without use of assistance device  HEENT: normocephalic atraumatic, mucous membranes moist Cardiovascular: regular rate and rhythm Pulmonary/Chest: normal work of breathing on room air, lungs clear to auscultation bilaterally Abdominal: soft, non-tender, non-distended MSK: normal bulk and tone. Neurological: alert & oriented x 3 Skin: warm and dry Psych: mood calm, behavior normal, thought content normal, judgement normal      The ASCVD Risk score (Arnett DK, et al., 2019) failed to calculate for the following reasons:   The valid total cholesterol range is 130 to 320 mg/dL      Assessment & Plan  Problem List Items Addressed This Visit       Digestive   Chronic diarrhea   Patient presents to clinic with concerns of chronic diarrhea for the past 2 years.  She generally has 1-2 episodes a day with occasional fecal incontinence associated with these episodes.  Patient identifies type 7 stool on [place] stool chart, and reports it is nonbloody and nonmelanotic.  She denies any abdominal pain associated with the diarrhea.  The diarrhea has not been frequent enough to warrant Imodium use at home.  She cannot recall her last firm bowel movement though notes that when she eats  bread and avoid spicy foods or hot sauce the BM firmness improves.  She has had routine FOBT testing for colon cancer screening though has not had any colonoscopies.  Her last episode of diarrhea was early last week, and she has not noted any specific triggers.  In discussing the chronicity, and trying to figure out her triggers, we discussed that her diarrhea began around the same time that she started taking her GLP-1 Trulicity .  She also takes metformin  though this has been a medication she has been on since 2012 or earlier.  Her diabetes has been well-controlled and her last A1c in 09/2024 was 6.5.  We discussed discontinuing Trulicity  for the next 4 to 6 weeks to see if this changes her symptoms at all and improves the diarrhea.  We also discussed increasing fiber in her diet as well as supplementation with OTC Metamucil or Benefiber.  She will try these changes, and return in about 6 weeks for recheck.  If  there have been no changes in her diarrhea, would recommend obtaining colonoscopy at that point as she is due for colon cancer screening.  If her diarrhea resolves after discontinuation of the Trulicity , then she could have 1 last Cologuard for colon cancer screening before discontinuing per USPSTF age-appropriate guidelines. - Discontinue Trulicity  - Increase fiber intake - f/u in 6 weeks for symptom recheck - If diarrhea has resolved, would recommend Cologuard for colon cancer screening one last time to satisfy USPSTF age-appropriate guidelines - If diarrhea is still ongoing, would recommend obtaining colonoscopy         Endocrine   Diabetes mellitus type 2 with retinopathy (HCC)   Last A1c from 10/01/2024 was 6.5.  Her current regimen is metformin  1000 mg twice a day, and Trulicity  1.5 mg weekly injection.  She presents today with concerns of chronic diarrhea which have been present for the past 2 years, and align with when she began taking Trulicity .  Given her diabetes is very  well-controlled, we will trial discontinuation of Trulicity  for 6 weeks to see if this improves her chronic diarrhea symptoms.  She will continue to work on glycemic control through her diet and metformin .  I have also switched her to the metformin  extended release formulation.  If her chronic diarrhea symptoms resolve during this time, would not recommend restarting Trulicity .  If her chronic diarrhea symptoms are still ongoing, then may consider resuming Trulicity  and obtaining colonoscopy.  She reports that she just saw her ophthalmologist for diabetic retinopathy screening a couple weeks ago, and request for her to have them send us  the records for eye exam documentation. - Continue Metformin  1000 mg BID; switched to extended release formulation - Discontinue Trulicity   - f/u in 6 weeks for diarrhea symptom recheck and A1c repeat - If chronic diarrhea resolves: would not resume Trulicity , and would obtain Cologuard for colon cancer screening  - If chronic diarrhea still ongoing: consider resuming Trulicity  if indicated and obtain colonoscopy       Relevant Medications   metFORMIN  (GLUCOPHAGE -XR) 500 MG 24 hr tablet     Other   Preventative health care - Primary    Return in about 6 weeks (around 12/25/2024) for recheck diarrhea .  Patient discussed with Dr. Francesco, who also saw and evaluated the patient.  Doyal Miyamoto, MD [facility]  PGY-1 11/14/2024, 12:05 PM

## 2024-11-14 DIAGNOSIS — K529 Noninfective gastroenteritis and colitis, unspecified: Secondary | ICD-10-CM | POA: Insufficient documentation

## 2024-11-14 NOTE — Progress Notes (Signed)
 Internal Medicine Clinic Attending  I was physically present during the key portions of the resident provided service and participated in the medical decision making of patient's management care. I reviewed pertinent patient test results.  The assessment, diagnosis, and plan were formulated together and I agree with the documentation in the resident's note.  Francesco Elsie NOVAK, MD

## 2024-11-14 NOTE — Assessment & Plan Note (Addendum)
 Last A1c from 10/01/2024 was 6.5.  Her current regimen is metformin  1000 mg twice a day, and Trulicity  1.5 mg weekly injection.  She presents today with concerns of chronic diarrhea which have been present for the past 2 years, and align with when she began taking Trulicity .  Given her diabetes is very well-controlled, we will trial discontinuation of Trulicity  for 6 weeks to see if this improves her chronic diarrhea symptoms.  She will continue to work on glycemic control through her diet and metformin .  I have also switched her to the metformin  extended release formulation.  If her chronic diarrhea symptoms resolve during this time, would not recommend restarting Trulicity .  If her chronic diarrhea symptoms are still ongoing, then may consider resuming Trulicity  and obtaining colonoscopy.  She reports that she just saw her ophthalmologist for diabetic retinopathy screening a couple weeks ago, and request for her to have them send us  the records for eye exam documentation. - Continue Metformin  1000 mg BID; switched to extended release formulation - Discontinue Trulicity   - f/u in 6 weeks for diarrhea symptom recheck and A1c repeat - If chronic diarrhea resolves: would not resume Trulicity , and would obtain Cologuard for colon cancer screening  - If chronic diarrhea still ongoing: consider resuming Trulicity  if indicated and obtain colonoscopy

## 2024-11-14 NOTE — Assessment & Plan Note (Addendum)
 Patient presents to clinic with concerns of chronic diarrhea for the past 2 years.  She generally has 1-2 episodes a day with occasional fecal incontinence associated with these episodes.  Patient identifies type 7 stool on [place] stool chart, and reports it is nonbloody and nonmelanotic.  She denies any abdominal pain associated with the diarrhea.  The diarrhea has not been frequent enough to warrant Imodium use at home.  She cannot recall her last firm bowel movement though notes that when she eats bread and avoid spicy foods or hot sauce the BM firmness improves.  She has had routine FOBT testing for colon cancer screening though has not had any colonoscopies.  Her last episode of diarrhea was early last week, and she has not noted any specific triggers.  In discussing the chronicity, and trying to figure out her triggers, we discussed that her diarrhea began around the same time that she started taking her GLP-1 Trulicity .  She also takes metformin  though this has been a medication she has been on since 2012 or earlier.  Her diabetes has been well-controlled and her last A1c in 09/2024 was 6.5.  We discussed discontinuing Trulicity  for the next 4 to 6 weeks to see if this changes her symptoms at all and improves the diarrhea.  We also discussed increasing fiber in her diet as well as supplementation with OTC Metamucil or Benefiber.  She will try these changes, and return in about 6 weeks for recheck.  If there have been no changes in her diarrhea, would recommend obtaining colonoscopy at that point as she is due for colon cancer screening.  If her diarrhea resolves after discontinuation of the Trulicity , then she could have 1 last Cologuard for colon cancer screening before discontinuing per USPSTF age-appropriate guidelines. - Discontinue Trulicity  - Increase fiber intake - f/u in 6 weeks for symptom recheck - If diarrhea has resolved, would recommend Cologuard for colon cancer screening one last time to  satisfy USPSTF age-appropriate guidelines - If diarrhea is still ongoing, would recommend obtaining colonoscopy

## 2024-11-21 ENCOUNTER — Other Ambulatory Visit (HOSPITAL_COMMUNITY): Payer: Self-pay

## 2024-11-21 ENCOUNTER — Other Ambulatory Visit: Payer: Self-pay | Admitting: Hematology and Oncology

## 2024-11-21 MED ORDER — ANASTROZOLE 1 MG PO TABS
1.0000 mg | ORAL_TABLET | Freq: Every day | ORAL | 1 refills | Status: AC
  Filled 2024-11-21: qty 90, 90d supply, fill #0

## 2024-12-09 ENCOUNTER — Other Ambulatory Visit: Payer: Self-pay | Admitting: Student

## 2024-12-09 ENCOUNTER — Other Ambulatory Visit: Payer: Self-pay

## 2024-12-09 ENCOUNTER — Other Ambulatory Visit (HOSPITAL_COMMUNITY): Payer: Self-pay

## 2024-12-09 DIAGNOSIS — I10 Essential (primary) hypertension: Secondary | ICD-10-CM

## 2024-12-09 MED ORDER — AMLODIPINE BESYLATE 10 MG PO TABS
10.0000 mg | ORAL_TABLET | Freq: Every day | ORAL | 11 refills | Status: AC
  Filled 2024-12-09 – 2025-01-13 (×2): qty 30, 30d supply, fill #0

## 2024-12-09 NOTE — Telephone Encounter (Signed)
 Medication sent to pharmacy

## 2024-12-15 ENCOUNTER — Other Ambulatory Visit (HOSPITAL_COMMUNITY): Payer: Self-pay

## 2024-12-15 ENCOUNTER — Other Ambulatory Visit: Payer: Self-pay

## 2024-12-17 ENCOUNTER — Other Ambulatory Visit (HOSPITAL_COMMUNITY): Payer: Self-pay

## 2024-12-26 ENCOUNTER — Other Ambulatory Visit: Payer: Self-pay

## 2024-12-26 NOTE — Telephone Encounter (Signed)
 Rx expired off of med list

## 2025-01-13 ENCOUNTER — Other Ambulatory Visit (HOSPITAL_COMMUNITY): Payer: Self-pay

## 2025-01-15 ENCOUNTER — Other Ambulatory Visit (HOSPITAL_COMMUNITY): Payer: Self-pay

## 2025-03-04 ENCOUNTER — Ambulatory Visit

## 2025-03-04 ENCOUNTER — Encounter

## 2025-10-30 ENCOUNTER — Inpatient Hospital Stay: Admitting: Hematology and Oncology
